# Patient Record
Sex: Male | Born: 1965 | Race: Black or African American | Hispanic: No | Marital: Married | Smoking: Never smoker
Health system: Southern US, Community
[De-identification: ages and names within clinical notes are randomized; demographics above are authoritative.]

## PROBLEM LIST (undated history)

## (undated) DIAGNOSIS — E7800 Pure hypercholesterolemia, unspecified: Secondary | ICD-10-CM

## (undated) DIAGNOSIS — I10 Essential (primary) hypertension: Secondary | ICD-10-CM

## (undated) DIAGNOSIS — E11319 Type 2 diabetes mellitus with unspecified diabetic retinopathy without macular edema: Secondary | ICD-10-CM

## (undated) DIAGNOSIS — E119 Type 2 diabetes mellitus without complications: Secondary | ICD-10-CM

## (undated) DIAGNOSIS — H3320 Serous retinal detachment, unspecified eye: Secondary | ICD-10-CM

## (undated) DIAGNOSIS — E109 Type 1 diabetes mellitus without complications: Secondary | ICD-10-CM

## (undated) DIAGNOSIS — I129 Hypertensive chronic kidney disease with stage 1 through stage 4 chronic kidney disease, or unspecified chronic kidney disease: Secondary | ICD-10-CM

## (undated) DIAGNOSIS — T678XXA Other effects of heat and light, initial encounter: Secondary | ICD-10-CM

## (undated) DIAGNOSIS — K3184 Gastroparesis: Secondary | ICD-10-CM

## (undated) HISTORY — DX: Type 2 diabetes mellitus with unspecified diabetic retinopathy without macular edema: E11.319

## (undated) HISTORY — PX: EYE SURGERY: SHX253

## (undated) HISTORY — DX: Serous retinal detachment, unspecified eye: H33.20

## (undated) HISTORY — PX: CATARACT EXTRACTION: SUR2

## (undated) HISTORY — PX: RETINAL DETACHMENT SURGERY: SHX105

---
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
## 2004-03-12 ENCOUNTER — Emergency Department (HOSPITAL_COMMUNITY): Admission: EM | Admit: 2004-03-12 | Discharge: 2004-03-12 | Payer: Self-pay | Admitting: Emergency Medicine

## 2004-07-14 ENCOUNTER — Ambulatory Visit: Payer: Self-pay | Admitting: *Deleted

## 2004-08-31 ENCOUNTER — Ambulatory Visit: Payer: Self-pay | Admitting: Family Medicine

## 2004-09-11 ENCOUNTER — Emergency Department (HOSPITAL_COMMUNITY): Admission: EM | Admit: 2004-09-11 | Discharge: 2004-09-11 | Payer: Self-pay | Admitting: Emergency Medicine

## 2004-09-19 ENCOUNTER — Emergency Department (HOSPITAL_COMMUNITY): Admission: EM | Admit: 2004-09-19 | Discharge: 2004-09-19 | Payer: Self-pay | Admitting: Emergency Medicine

## 2004-10-07 ENCOUNTER — Ambulatory Visit: Payer: Self-pay | Admitting: Family Medicine

## 2005-10-30 ENCOUNTER — Ambulatory Visit (HOSPITAL_COMMUNITY): Admission: RE | Admit: 2005-10-30 | Discharge: 2005-10-30 | Payer: Self-pay | Admitting: Internal Medicine

## 2006-03-19 ENCOUNTER — Emergency Department (HOSPITAL_COMMUNITY): Admission: EM | Admit: 2006-03-19 | Discharge: 2006-03-19 | Payer: Self-pay | Admitting: Emergency Medicine

## 2012-03-24 DIAGNOSIS — E113599 Type 2 diabetes mellitus with proliferative diabetic retinopathy without macular edema, unspecified eye: Secondary | ICD-10-CM | POA: Insufficient documentation

## 2012-05-02 DIAGNOSIS — E1142 Type 2 diabetes mellitus with diabetic polyneuropathy: Secondary | ICD-10-CM | POA: Insufficient documentation

## 2012-09-30 DIAGNOSIS — R809 Proteinuria, unspecified: Secondary | ICD-10-CM | POA: Insufficient documentation

## 2012-11-27 DIAGNOSIS — R609 Edema, unspecified: Secondary | ICD-10-CM | POA: Insufficient documentation

## 2014-12-17 ENCOUNTER — Emergency Department (HOSPITAL_COMMUNITY): Payer: Medicare Other

## 2014-12-17 ENCOUNTER — Emergency Department (HOSPITAL_COMMUNITY)
Admission: EM | Admit: 2014-12-17 | Discharge: 2014-12-17 | Disposition: A | Discharge status: Home / Self Care | Payer: Medicare Other | Attending: Emergency Medicine | Admitting: Emergency Medicine

## 2014-12-17 ENCOUNTER — Encounter (HOSPITAL_COMMUNITY): Payer: Self-pay | Admitting: Emergency Medicine

## 2014-12-17 DIAGNOSIS — R05 Cough: Secondary | ICD-10-CM | POA: Diagnosis present

## 2014-12-17 DIAGNOSIS — R059 Cough, unspecified: Secondary | ICD-10-CM

## 2014-12-17 DIAGNOSIS — J159 Unspecified bacterial pneumonia: Secondary | ICD-10-CM | POA: Diagnosis not present

## 2014-12-17 DIAGNOSIS — I10 Essential (primary) hypertension: Secondary | ICD-10-CM | POA: Diagnosis not present

## 2014-12-17 DIAGNOSIS — E119 Type 2 diabetes mellitus without complications: Secondary | ICD-10-CM | POA: Diagnosis not present

## 2014-12-17 DIAGNOSIS — J189 Pneumonia, unspecified organism: Secondary | ICD-10-CM

## 2014-12-17 HISTORY — DX: Essential (primary) hypertension: I10

## 2014-12-17 HISTORY — DX: Type 2 diabetes mellitus without complications: E11.9

## 2014-12-17 MED ORDER — HYDROCODONE-HOMATROPINE 5-1.5 MG/5ML PO SYRP: 5.0000 mL | ORAL_SOLUTION | Freq: Four times a day (QID) | ORAL | Status: DC | PRN

## 2014-12-17 MED ORDER — ACETAMINOPHEN 325 MG PO TABS
325.0000 mg | ORAL_TABLET | Freq: Once | ORAL | Status: AC
  Administered 2014-12-17: 325 mg via ORAL
  Filled 2014-12-17: qty 1

## 2014-12-17 MED ORDER — LEVOFLOXACIN 750 MG PO TABS: 750.0000 mg | ORAL_TABLET | Freq: Every day | ORAL | Status: DC

## 2014-12-17 NOTE — ED Notes (Signed)
Pt sts fever and cough x several days; pt sts seen x 2 at PCP; pt given zpack that he started yesterday; pt sts body aches and pain with cough

## 2014-12-17 NOTE — Discharge Instructions (Signed)
Pneumonia, Adult  Pneumonia is an infection of the lungs. It may be caused by a germ (virus or bacteria). Some types of pneumonia can spread easily from person to person. This can happen when you cough or sneeze.  HOME CARE   Only take medicine as told by your doctor.   Take your medicine (antibiotics) as told. Finish it even if you start to feel better.   Do not smoke.   You may use a vaporizer or humidifier in your room. This can help loosen thick spit (mucus).   Sleep so you are almost sitting up (semi-upright). This helps reduce coughing.   Rest.  A shot (vaccine) can help prevent pneumonia. Shots are often advised for:   People over 65 years old.   Patients on chemotherapy.   People with long-term (chronic) lung problems.   People with immune system problems.  GET HELP RIGHT AWAY IF:    You are getting worse.   You cannot control your cough, and you are losing sleep.   You cough up blood.   Your pain gets worse, even with medicine.   You have a fever.   Any of your problems are getting worse, not better.   You have shortness of breath or chest pain.  MAKE SURE YOU:    Understand these instructions.   Will watch your condition.   Will get help right away if you are not doing well or get worse.  Document Released: 03/06/2008 Document Revised: 12/11/2011 Document Reviewed: 12/09/2010  [employer] Patient Information 2015 [employer]. This information is not intended to replace advice given to you by your health care provider. Make sure you discuss any questions you have with your health care provider.

## 2014-12-17 NOTE — ED Notes (Signed)
Declined W/C at D/C and was escorted to lobby by RN. 

## 2014-12-17 NOTE — ED Provider Notes (Signed)
CSN: [id number]     Arrival date & time 12/17/14  1256 History  This chart was scribed for non-physician practitioner Glendell Docker, NP working with Charlesetta Shanks, MD, by Eustaquio Maize, ED Scribe. This patient was seen in room TR08C/TR08C and the patient's care was started at 1:54 PM.    Chief Complaint  Patient presents with  . Fever  . Cough   The history is provided by the patient. No language interpreter was used.     HPI Comments: Louis Peters is a 49 y.o. male who presents to the Emergency Department complaining of a cough that began 1.5 weeks ago. Pt also complains of a fever. Reports that he was seen at Urgent Care on Sunday, 3/13. Pt was tested for flu and prescribed Tamiflu. Pt states that he was not feeling any better so he went back to Urgent Care 1 day ago. He was prescribed a Z pack yesterday. He denies chills or any other symptoms. He also denies cigarette use.     Past Medical History  Diagnosis Date  . Hypertension   . Diabetes mellitus without complication    History reviewed. No pertinent past surgical history. History reviewed. No pertinent family history. History  Substance Use Topics  . Smoking status: Never Smoker   . Smokeless tobacco: Not on file  . Alcohol Use: Yes     Comment: occ    Review of Systems  Constitutional: Positive for fever. Negative for chills.  Respiratory: Positive for cough.   All other systems reviewed and are negative.     Allergies  Review of patient's allergies indicates no known allergies.  Home Medications   Prior to Admission medications   Medication Sig Start Date End Date Taking? Authorizing Provider  HYDROcodone-homatropine (HYDROMET) 5-1.5 MG/5ML syrup Take 5 mLs by mouth every 6 (six) hours as needed for cough. 12/17/14   Glendell Docker, NP  levofloxacin (LEVAQUIN) 750 MG tablet Take 1 tablet (750 mg total) by mouth daily. 12/17/14   Glendell Docker, NP   Triage Vitals: BP 133/63 mmHg  Pulse 99   Temp(Src) 100.6 F (38.1 C) (Oral)  Resp 18  SpO2 96%   Physical Exam  Constitutional: He is oriented to person, place, and time. He appears well-developed and well-nourished. No distress.  HENT:  Head: Normocephalic and atraumatic.  Nose: Rhinorrhea present.  Eyes: Conjunctivae and EOM are normal.  Neck: Neck supple. No tracheal deviation present.  Cardiovascular: Normal rate.   Pulmonary/Chest: Effort normal. No respiratory distress.  Musculoskeletal: Normal range of motion.  Neurological: He is alert and oriented to person, place, and time.  Skin: Skin is warm and dry.  Psychiatric: He has a normal mood and affect. His behavior is normal.  Nursing note and vitals reviewed.   ED Course  Procedures (including critical care time)  DIAGNOSTIC STUDIES: Oxygen Saturation is 96% on RA, normal by my interpretation.    COORDINATION OF CARE: 2:04 PM-Discussed treatment plan which includes CXR with pt at bedside and pt agreed to plan.   Labs Review Labs Reviewed - No data to display  Imaging Review Dg Chest 2 View  12/17/2014   CLINICAL DATA:  Croupy cough for 1.5 weeks, sore throat, history diabetes, hypertension  EXAM: CHEST  2 VIEW  COMPARISON:  None  FINDINGS: Upper normal heart size.  Normal mediastinal contours and pulmonary vascularity.  RIGHT upper lobe infiltrate near minor fissure consistent with pneumonia.  Central peribronchial thickening.  Remaining lungs clear.  No pleural effusion or  pneumothorax.  Bones unremarkable.  IMPRESSION: Bronchitic changes with RIGHT upper lobe pneumonia.   Electronically Signed   By: Lavonia Dana M.D.   On: 12/17/2014 13:58     EKG Interpretation None      MDM   Final diagnoses:  Cough  CAP (community acquired pneumonia)    Will switch to levofloxacin. Pt not hypoxic or tachycardic. Discussed return precautions with pt  I personally performed the services described in this documentation, which was scribed in my presence. The  recorded information has been reviewed and is accurate.      Glendell Docker, NP 12/17/14 1418  Charlesetta Shanks, MD 12/19/14 [phone]

## 2014-12-21 ENCOUNTER — Emergency Department (HOSPITAL_COMMUNITY): Payer: Medicare Other

## 2014-12-21 ENCOUNTER — Emergency Department (HOSPITAL_COMMUNITY)
Admission: EM | Admit: 2014-12-21 | Discharge: 2014-12-21 | Disposition: A | Discharge status: Home / Self Care | Payer: Medicare Other | Attending: Emergency Medicine | Admitting: Emergency Medicine

## 2014-12-21 ENCOUNTER — Encounter (HOSPITAL_COMMUNITY): Payer: Self-pay | Admitting: Family Medicine

## 2014-12-21 DIAGNOSIS — Z79899 Other long term (current) drug therapy: Secondary | ICD-10-CM | POA: Diagnosis not present

## 2014-12-21 DIAGNOSIS — Z792 Long term (current) use of antibiotics: Secondary | ICD-10-CM | POA: Diagnosis not present

## 2014-12-21 DIAGNOSIS — N289 Disorder of kidney and ureter, unspecified: Secondary | ICD-10-CM | POA: Diagnosis not present

## 2014-12-21 DIAGNOSIS — Z91040 Latex allergy status: Secondary | ICD-10-CM | POA: Diagnosis not present

## 2014-12-21 DIAGNOSIS — E108 Type 1 diabetes mellitus with unspecified complications: Secondary | ICD-10-CM

## 2014-12-21 DIAGNOSIS — Z794 Long term (current) use of insulin: Secondary | ICD-10-CM | POA: Insufficient documentation

## 2014-12-21 DIAGNOSIS — I10 Essential (primary) hypertension: Secondary | ICD-10-CM | POA: Diagnosis not present

## 2014-12-21 DIAGNOSIS — E109 Type 1 diabetes mellitus without complications: Secondary | ICD-10-CM | POA: Insufficient documentation

## 2014-12-21 DIAGNOSIS — R059 Cough, unspecified: Secondary | ICD-10-CM

## 2014-12-21 DIAGNOSIS — R079 Chest pain, unspecified: Secondary | ICD-10-CM | POA: Diagnosis present

## 2014-12-21 DIAGNOSIS — R05 Cough: Secondary | ICD-10-CM

## 2014-12-21 LAB — CBG MONITORING, ED
Glucose-Capillary: 422 mg/dL — ABNORMAL HIGH (ref 70–99)
Glucose-Capillary: 349 mg/dL — ABNORMAL HIGH (ref 70–99)

## 2014-12-21 LAB — CBC
HCT: 35.6 % — ABNORMAL LOW (ref 39.0–52.0)
HEMOGLOBIN: 11.5 g/dL — AB (ref 13.0–17.0)
MCH: 27.6 pg (ref 26.0–34.0)
MCHC: 32.3 g/dL (ref 30.0–36.0)
MCV: 85.6 fL (ref 78.0–100.0)
Platelets: 387 10*3/uL (ref 150–400)
RBC: 4.16 MIL/uL — AB (ref 4.22–5.81)
RDW: 12.5 % (ref 11.5–15.5)
WBC: 9.7 10*3/uL (ref 4.0–10.5)

## 2014-12-21 LAB — I-STAT CHEM 8, ED
BUN: 23 mg/dL (ref 6–23)
CALCIUM ION: 1.23 mmol/L (ref 1.12–1.23)
CHLORIDE: 101 mmol/L (ref 96–112)
CREATININE: 1.6 mg/dL — AB (ref 0.50–1.35)
Glucose, Bld: 377 mg/dL — ABNORMAL HIGH (ref 70–99)
HCT: 37 % — ABNORMAL LOW (ref 39.0–52.0)
Hemoglobin: 12.6 g/dL — ABNORMAL LOW (ref 13.0–17.0)
POTASSIUM: 4.5 mmol/L (ref 3.5–5.1)
SODIUM: 138 mmol/L (ref 135–145)
TCO2: 22 mmol/L (ref 0–100)

## 2014-12-21 LAB — I-STAT TROPONIN, ED: TROPONIN I, POC: 0 ng/mL (ref 0.00–0.08)

## 2014-12-21 LAB — BRAIN NATRIURETIC PEPTIDE: B NATRIURETIC PEPTIDE 5: 42.8 pg/mL (ref 0.0–100.0)

## 2014-12-21 MED ORDER — INSULIN ASPART 100 UNIT/ML ~~LOC~~ SOLN
5.0000 [IU] | Freq: Once | SUBCUTANEOUS | Status: AC
  Administered 2014-12-21: 5 [IU] via INTRAVENOUS
  Filled 2014-12-21: qty 1

## 2014-12-21 MED ORDER — SODIUM CHLORIDE 0.9 % IV BOLUS (SEPSIS)
2000.0000 mL | Freq: Once | INTRAVENOUS | Status: AC
  Administered 2014-12-21: 2000 mL via INTRAVENOUS

## 2014-12-21 NOTE — ED Notes (Signed)
Pt recently dx with PNA and the flu. sts just finished abx. sts started vomiting on Saturday. Unable to eat. sts chest pain, SOB.

## 2014-12-21 NOTE — ED Notes (Signed)
Dr Beaton at bedside 

## 2014-12-21 NOTE — ED Notes (Signed)
Pt remains monitored by blood pressure, pulse ox, and 5 lead. pts family at bedside.

## 2014-12-21 NOTE — Discharge Instructions (Signed)
How to Avoid Diabetes Problems You can do a lot to prevent or slow down diabetes problems. Following your diabetes plan and taking care of yourself can reduce your risk of serious or life-threatening complications. Below, you will find certain things you can do to prevent diabetes problems. MANAGE YOUR DIABETES Follow your health care provider's, nurse educator's, and dietitian's instructions for managing your diabetes. They will teach you the basics of diabetes care. They can help answer questions you may have. Learn about diabetes and make healthy choices regarding eating and physical activity. Monitor your blood glucose level regularly. Your health care provider will help you decide how often to check your blood glucose level depending on your treatment goals and how well you are meeting them.  DO NOT USE NICOTINE Nicotine and diabetes are a dangerous combination. Nicotine raises your risk for diabetes problems. If you quit using nicotine, you will lower your risk for heart attack, stroke, nerve disease, and kidney disease. Your cholesterol and your blood pressure levels may improve. Your blood circulation will also improve. Do not use any tobacco products, including cigarettes, chewing tobacco, or electronic cigarettes. If you need help quitting, ask your health care provider. KEEP YOUR BLOOD PRESSURE UNDER CONTROL Keeping your blood pressure under control will help prevent damage to your eyes, kidneys, heart, and blood vessels. Blood pressure consists of two numbers. The top number should be below 120, and the bottom number should be below 80 (120/80). Keep your blood pressure as close to these numbers as you can. If you already have kidney disease, you may want even lower blood pressure to protect your kidneys. Talk to your health care provider to make sure that your blood pressure goal is right for your needs. Meal planning, medicines, and exercise can help you reach your blood pressure target. Have  your blood pressure checked at every visit with your health care provider. KEEP YOUR CHOLESTEROL UNDER CONTROL Normal cholesterol levels will help prevent heart disease and stroke. These are the biggest health problems for people with diabetes. Keeping cholesterol levels under control can also help with blood flow. Have your cholesterol level checked at least once a year. Your health care provider may prescribe a medicine known as a statin. Statins lower your cholesterol. If you are not taking a statin, ask your health care provider if you should be. Meal planning, exercise, and medicines can help you reach your cholesterol targets.  SCHEDULE AND KEEP YOUR ANNUAL PHYSICAL EXAMS AND EYE EXAMS Your health care provider will tell you how often he or she wants to see you depending on your plan of treatment. It is important that you keep these appointments so that possible problems can be identified early and complications can be avoided or treated.  Every visit with your health care provider should include your weight, blood pressure, and an evaluation of your blood glucose control.  Your hemoglobin A1c should be checked:  At least twice a year if you are at your goal.  Every 3 months if there are changes in treatment.  If you are not meeting your goals.  Your blood lipids should be checked yearly. You should also be checked yearly to see if you have protein in your urine (microalbumin).  Schedule a dilated eye exam within 5 years of your diagnosis if you have type 1 diabetes, and then yearly. Schedule a dilated eye exam at diagnosis if you have type 2 diabetes, and then yearly. All exams thereafter can be extended to every 2  to 3 years if one or more exams have been normal. KEEP YOUR VACCINES CURRENT The flu vaccine is recommended yearly. The formula for the vaccine changes every year and needs to be updated for the best protection against current viruses. It is recommended that people with diabetes  who are over 70 years old get the pneumonia vaccine. In some cases, two separate shots may be given. Ask your health care provider if your pneumonia vaccination is up-to-date. However, there are some instances where another vaccine is recommended. Check with your health care provider. TAKE CARE OF YOUR FEET  Diabetes may cause you to have a poor blood supply (circulation) to your legs and feet. Because of this, the skin may be thinner, break easier, and heal more slowly. You also may have nerve damage in your legs and feet, causing decreased feeling. You may not notice minor injuries to your feet that could lead to serious problems or infections. Taking care of your feet is very important. Visual foot exams are performed at every routine medical visit. The exams check for cuts, injuries, or other problems with the feet. A comprehensive foot exam should be done yearly. This includes visual inspection as well as assessing foot pulses and testing for loss of sensation. You should also do the following:  Inspect your feet daily for cuts, calluses, blisters, ingrown toenails, and signs of infection, such as redness, swelling, or pus.  Wash and dry your feet thoroughly, especially between the toes.  Avoid soaking your feet regularly in hot water baths.  Moisturize dry skin with lotion, avoiding areas between your toes.  Cut toenails straight across and file the edges.  Avoid shoes that do not fit well or have areas that irritate your skin.  Avoid going barefooted or wearing only socks. Your feet need protection. TAKE CARE OF YOUR TEETH People with poorly controlled diabetes are more likely to have gum (periodontal) disease. These infections make diabetes harder to control. Periodontal diseases, if left untreated, can lead to tooth loss. Brush your teeth twice a day, floss, and see your dentist for checkups and cleaning every 6 months, or 2 times a year. ASK YOUR HEALTH CARE PROVIDER ABOUT TAKING  ASPIRIN Taking aspirin daily is recommended to help prevent cardiovascular disease in people with and without diabetes. Ask your health care provider if this would benefit you and what dose he or she would recommend. DRINK RESPONSIBLY Moderate amounts of alcohol (less than 1 drink per day for adult women and less than 2 drinks per day for adult men) have a minimal effect on blood glucose if ingested with food. It is important to eat food with alcohol to avoid hypoglycemia. People should avoid alcohol if they have a history of alcohol abuse or dependence, if they are pregnant, and if they have liver disease, pancreatitis, advanced neuropathy, or severe hypertriglyceridemia. LESSEN STRESS Living with diabetes can be stressful. When you are under stress, your blood glucose may be affected in two ways:  Stress hormones may cause your blood glucose to rise.  You may be distracted from taking good care of yourself. It is a good idea to be aware of your stress level and make changes that are necessary to help you better manage challenging situations. Support groups, planned relaxation, a hobby you enjoy, meditation, healthy relationships, and exercise all work to lower your stress level. If your efforts do not seem to be helping, get help from your health care provider or a trained mental health professional. Document  Released: 06/06/2011 Document Revised: 02/02/2014 Document Reviewed: 11/12/2013 [facility] Patient Information 2015 [place], [place]. This information is not intended to replace advice given to you by your health care provider. Make sure you discuss any questions you have with your health care provider.

## 2014-12-21 NOTE — ED Provider Notes (Signed)
CSN: [id number]     Arrival date & time 12/21/14  1713 History   First MD Initiated Contact with Patient 12/21/14 1856     Chief Complaint  Patient presents with  . Chest Pain  . Cough  . Shortness of Breath      HPI Patient was recently diagnosed with pneumonia.  Started on antibiotics.  Has not been taking his insulin medication.  Patient last vomited on Saturday.  Has been thirsty. Past Medical History  Diagnosis Date  . Hypertension   . Diabetes mellitus without complication    History reviewed. No pertinent past surgical history. History reviewed. No pertinent family history. History  Substance Use Topics  . Smoking status: Never Smoker   . Smokeless tobacco: Not on file  . Alcohol Use: Yes     Comment: occ    Review of Systems  All other systems reviewed and are negative  Allergies  Latex  Home Medications   Prior to Admission medications   Medication Sig Start Date End Date Taking? Authorizing Provider  acetaminophen (TYLENOL) 500 MG tablet Take 1,000 mg by mouth every 6 (six) hours as needed for mild pain.   Yes Historical Provider, MD  atorvastatin (LIPITOR) 20 MG tablet Take 20 mg by mouth daily. 10/13/14  Yes Historical Provider, MD  AZOR 10-40 MG per tablet Take 1 tablet by mouth daily. 12/16/14  Yes Historical Provider, MD  HUMALOG KWIKPEN 100 UNIT/ML KiwkPen Inject 5-7 Units into the skin 4 (four) times daily.  12/14/14  Yes Historical Provider, MD  HYDROcodone-homatropine (HYDROMET) 5-1.5 MG/5ML syrup Take 5 mLs by mouth every 6 (six) hours as needed for cough. 12/17/14  Yes Glendell Docker, NP  ibuprofen (ADVIL,MOTRIN) 200 MG tablet Take 400 mg by mouth every 6 (six) hours as needed for mild pain.   Yes Historical Provider, MD  insulin glargine (LANTUS) 100 UNIT/ML injection Inject 45 Units into the skin at bedtime.    Yes Historical Provider, MD  levofloxacin (LEVAQUIN) 750 MG tablet Take 1 tablet (750 mg total) by mouth daily. 12/17/14  Yes Glendell Docker, NP   BP 144/77 mmHg  Pulse 93  Temp(Src) 98.1 F (36.7 C)  Resp 16  Wt 225 lb (102.059 kg)  SpO2 100% Physical Exam Physical Exam  Nursing note and vitals reviewed. Constitutional: He is oriented to person, place, and time. He appears well-developed and well-nourished. No distress.  HENT:  Head: Normocephalic and atraumatic.  Eyes: Pupils are equal, round, and reactive to light.  Neck: Normal range of motion.  Cardiovascular: Normal rate and intact distal pulses.   Pulmonary/Chest: No respiratory distress.  Abdominal: Normal appearance. He exhibits no distension.  Musculoskeletal: Normal range of motion.  Neurological: He is alert and oriented to person, place, and time. No cranial nerve deficit.  Skin: Skin is warm and dry. No rash noted.  Psychiatric: He has a normal mood and affect. His behavior is normal.   ED Course  Procedures (including critical care time) Medications  sodium chloride 0.9 % bolus 2,000 mL (0 mLs Intravenous Stopped 12/21/14 2059)  insulin aspart (novoLOG) injection 5 Units (5 Units Intravenous Given 12/21/14 2156)    Labs Review Labs Reviewed  CBC - Abnormal; Notable for the following:    RBC 4.16 (*)    Hemoglobin 11.5 (*)    HCT 35.6 (*)    All other components within normal limits  BASIC METABOLIC PANEL - Abnormal; Notable for the following:    Sodium 129 (*)  Potassium 5.8 (*)    Chloride 94 (*)    Glucose, Bld 745 (*)    Creatinine, Ser 2.30 (*)    GFR calc non Af Amer 31 (*)    GFR calc Af Amer 36 (*)    All other components within normal limits  CBG MONITORING, ED - Abnormal; Notable for the following:    Glucose-Capillary 422 (*)    All other components within normal limits  I-STAT CHEM 8, ED - Abnormal; Notable for the following:    Creatinine, Ser 1.60 (*)    Glucose, Bld 377 (*)    Hemoglobin 12.6 (*)    HCT 37.0 (*)    All other components within normal limits  CBG MONITORING, ED - Abnormal; Notable for the  following:    Glucose-Capillary 349 (*)    All other components within normal limits  BRAIN NATRIURETIC PEPTIDE  I-STAT TROPOININ, ED    Imaging Review No results found.   EKG Interpretation   Date/Time:  Monday December 21 2014 17:18:40 EDT Ventricular Rate:  96 PR Interval:  194 QRS Duration: 72 QT Interval:  336 QTC Calculation: 424 R Axis:   52 Text Interpretation:  Normal sinus rhythm Nonspecific T wave abnormality  Abnormal ECG Confirmed by Quame Spratlin  MD, Emaree Chiu ([id number]) on 12/21/2014 7:07:05  PM     After treatment in the ED the patient feels back to baseline and wants to go home.  Instructed for him to start back on his insulin when he gets home MDM   Final diagnoses:  Renal insufficiency  Type 1 diabetes mellitus with complications        Leonard Schwartz, MD 12/26/14 [phone]

## 2014-12-21 NOTE — ED Notes (Signed)
Pt remains monitored by blood pressure, pulse ox, and 5 lead. pts family remains at bedside.  

## 2014-12-21 NOTE — ED Notes (Signed)
Pts CBG 349

## 2014-12-22 LAB — BASIC METABOLIC PANEL
Anion gap: 13 (ref 5–15)
BUN: 22 mg/dL (ref 6–23)
CHLORIDE: 94 mmol/L — AB (ref 96–112)
CO2: 22 mmol/L (ref 19–32)
Calcium: 8.7 mg/dL (ref 8.4–10.5)
Creatinine, Ser: 2.3 mg/dL — ABNORMAL HIGH (ref 0.50–1.35)
GFR calc non Af Amer: 31 mL/min — ABNORMAL LOW (ref >90)
GFR, EST AFRICAN AMERICAN: 36 mL/min — AB (ref >90)
Glucose, Bld: 745 mg/dL (ref 70–99)
POTASSIUM: 5.8 mmol/L — AB (ref 3.5–5.1)
Sodium: 129 mmol/L — ABNORMAL LOW (ref 135–145)

## 2015-01-20 ENCOUNTER — Encounter: Payer: Medicare Other | Attending: Family Medicine | Admitting: *Deleted

## 2015-01-20 VITALS — Ht 70.0 in | Wt 219.1 lb

## 2015-01-20 DIAGNOSIS — E1065 Type 1 diabetes mellitus with hyperglycemia: Secondary | ICD-10-CM

## 2015-01-20 DIAGNOSIS — Z713 Dietary counseling and surveillance: Secondary | ICD-10-CM | POA: Diagnosis not present

## 2015-01-20 DIAGNOSIS — Z794 Long term (current) use of insulin: Secondary | ICD-10-CM | POA: Insufficient documentation

## 2015-01-20 DIAGNOSIS — E109 Type 1 diabetes mellitus without complications: Secondary | ICD-10-CM | POA: Insufficient documentation

## 2015-01-20 NOTE — Patient Instructions (Addendum)
Plan:  Aim for 4 Carb Choices per meal (60 grams) +/- 1 either way Practice looking at your foods by food group and then convert to Carb Choices from there. Aim for 0-2 Carbs per snack if hungry  Include protein in moderation with your meals and snacks Consider reading food labels for Total Carbohydrate of foods Continue with your activity level daily as tolerated Continue checking BG daily   Consider taking Lantus insulin at 9 PM more consistently and Humalog at meals as directed Consider attending the Type 1 Diabetes Support Group next month

## 2015-01-20 NOTE — Progress Notes (Signed)
Diabetes Self-Management Education  Visit Type:  Initial  Appt. Start Time: 1030 Appt. End Time: 1200  01/20/2015  [name], identified by name and date of birth, is a 49 y.o. male with a diagnosis of Diabetes: Type 1 (Diagnosed in 1982).  Other people present during visit:  Patient   ASSESSMENT  Height 5\' 10"  (1.778 m), weight 219 lb 1.6 oz (99.383 kg). Body mass index is 31.44 kg/(m^2).  Initial Visit Information:  Are you currently following a meal plan?: No   Are you taking your medications as prescribed?: Yes Are you checking your feet?: Yes How many days per week are you checking your feet?: 7 How often do you need to have someone help you when you read instructions, pamphlets, or other written materials from your doctor or pharmacy?: 1 - Never What is the last grade level you completed in school?: 12  Psychosocial:     Patient Belief/Attitude about Diabetes: Motivated to manage diabetes Self-care barriers: None Self-management support: Family Other persons present: Patient Patient Concerns: Problem Solving (States low BG every AM around 11) Special Needs: None Preferred Learning Style: Auditory Learning Readiness: Ready  Complications:   Last HgB A1C per patient/outside source: 11.6 % How often do you check your blood sugar?: 3-4 times/day Fasting Blood glucose range (mg/dL): <70, >200 Postprandial Blood glucose range (mg/dL): <70, >200 Number of hypoglycemic episodes per month: 30 Have you had a dilated eye exam in the past 12 months?: Yes (retina detachment surgery 3 weeks ago) Have you had a dental exam in the past 12 months?: No (dentures)  Diet Intake:  Breakfast: eggs, grits, sausage or bacon, toast,  Snack (morning): if low BG, drinks milk for low BG treatment Lunch: sandwich, fresh fruit Snack (afternoon): same as AM Dinner: meat, starch, vegetables, occasionally bread,  Snack (evening): left overs or sandwich to prevent low BG during the  night Beverage(s): water  Exercise:  Exercise: Moderate (swimming / aerobic walking) (runs, walks, treadmill, lifts weights, for 90 minutes every day) Moderate Exercise amount of time (min / week):  (goes every AM)  Individualized Plan for Diabetes Self-Management Training:   Learning Objective:  Patient will have a greater understanding of diabetes self-management.  Patient education plan per assessed needs and concerns is to attend individual sessions for     Education Topics Reviewed with Patient Today:    Role of diet in the treatment of diabetes and the relationship between the three main macronutrients and blood glucose level, Food label reading, portion sizes and measuring food., Carbohydrate counting, Meal timing in regards to the patients' current diabetes medication., Other (comment) (Discussed jpotential of moving towards a Carb Ratio so he could adjust meal time insulin to actual food intake) Role of exercise on diabetes management, blood pressure control and cardiac health. Reviewed patients medication for diabetes, action, purpose, timing of dose and side effects.   Taught treatment of hypoglycemia - the 15 rule. Assessed and discussed foot care and prevention of foot problems Other (comment) (Provided flyer for Type 1 Support Group meetings)      PATIENTS GOALS/Plan (Developed by the patient):  Nutrition: Other (comment) (MOre consistent carb intake throughout the day) Physical Activity: Exercise 5-7 days per week Medications: take my medication as prescribed Monitoring : test blood glucose pre and post meals as discussed Health Coping: Not Applicable  Plan:   Patient Instructions  Plan:  Aim for 4 Carb Choices per meal (60 grams) +/- 1 either way Practice looking at your  foods by food group and then convert to Carb Choices from there. Aim for 0-2 Carbs per snack if hungry  Include protein in moderation with your meals and snacks Consider reading food labels  for Total Carbohydrate of foods Continue with your activity level daily as tolerated Continue checking BG daily   Consider taking Lantus insulin at 9 PM more consistently and Humalog at meals as directed Consider attending the Type 1 Diabetes Support Group next month       Expected Outcomes:  Demonstrated interest in learning. Expect positive outcomes  Education material provided: Meal plan card and Carbohydrate counting sheet, Intro to Pumping handout, Label Reading, Insulin Action handout  If problems or questions, patient to contact team via:  Phone and Email  Future DSME appointment: PRN (Will determine follow up based on future appointments with MD)

## 2015-01-22 ENCOUNTER — Encounter: Payer: Self-pay | Admitting: *Deleted

## 2015-01-26 ENCOUNTER — Ambulatory Visit (INDEPENDENT_AMBULATORY_CARE_PROVIDER_SITE_OTHER): Payer: Medicare Other | Admitting: Endocrinology

## 2015-01-26 ENCOUNTER — Encounter: Payer: Self-pay | Admitting: Endocrinology

## 2015-01-26 VITALS — BP 136/80 | HR 85 | Temp 98.2°F | Ht 70.0 in | Wt 227.0 lb

## 2015-01-26 DIAGNOSIS — E1022 Type 1 diabetes mellitus with diabetic chronic kidney disease: Secondary | ICD-10-CM

## 2015-01-26 DIAGNOSIS — N189 Chronic kidney disease, unspecified: Secondary | ICD-10-CM

## 2015-01-26 NOTE — Patient Instructions (Addendum)
good diet and exercise significantly improve the control of your diabetes.  please let me know if you wish to be referred to a dietician.  high blood sugar is very risky to your health.  you should see an eye doctor and dentist every year.  It is very important to get all recommended vaccinations.  controlling your blood pressure and cholesterol drastically reduces the damage diabetes does to your body.  Those who smoke should quit.  please discuss these with your doctor.  At our office, we are fortunate to have two specialists who are happy to help you:   Leonia Reader, RN, CDE, is a diabetes educator and pump trainer.  She is here on Monday mornings, and all day Tuesday and Wednesday.  She is can help you with low blood sugar avoidance and treatment, injecting insulin, sick day management, and others.   Antonieta Iba, RD is our dietician.  She is here all day Thursday and Friday.  She can advise you about a healthy diet.  She can also help you about a variety of special diabetes situations, such as shift work, Actor, gluten-free, diet for kidney patients, traveling with diabetes, and help for those who need to gain weight.   check your blood sugar 5 times a day: before the 3 meals, as needed for symptoms, and at bedtime.  also check if you have symptoms of your blood sugar being too high or too low.  please keep a record of the readings and bring it to your next appointment here.  You can write it on any piece of paper.  please call us sooner if your blood sugar goes below 70, or if you have a lot of readings over 200. blood tests are requested for you today.  We'll let you know about the results. For now: Please reduce the lantus to 35 units at bedtime, and: Increase the humalog to 10 units 3 times a day (just before each meal), no matter what your blood sugar is. Please come back for a follow-up appointment in 2 weeks

## 2015-01-26 NOTE — Progress Notes (Signed)
Subjective:    Patient ID: Louis Peters, male    DOB: Dec 08, 1965, 49 y.o.   MRN: [id number]  HPI Hx is from pt and wife.  pt states DM was dx'ed in 1982; he has mild if any neuropathy of the lower extremities, but he has associated retinopathy and renal insufficiency; he has been on insulin since soon after dx; pt says his diet and exercise are "pretty good;' he has never had pancreatitis or DKA.  Last episode of severe hypoglycemia was last week.  This happens with exercise, at any time of day. Past Medical History  Diagnosis Date  . Hypertension   . Diabetes mellitus without complication     No past surgical history on file.  History   Social History  . Marital Status: Married    Spouse Name: N/A  . Number of Children: N/A  . Years of Education: N/A   Occupational History  . Not on file.   Social History Main Topics  . Smoking status: Never Smoker   . Smokeless tobacco: Not on file  . Alcohol Use: Yes     Comment: occ  . Drug Use: No  . Sexual Activity: Not on file   Other Topics Concern  . Not on file   Social History Narrative    Current Outpatient Prescriptions on File Prior to Visit  Medication Sig Dispense Refill  . AZOR 10-40 MG per tablet Take 1 tablet by mouth daily.  0  . HUMALOG KWIKPEN 100 UNIT/ML KiwkPen Inject 10 Units into the skin 3 (three) times daily with meals.   0  . ibuprofen (ADVIL,MOTRIN) 200 MG tablet Take 400 mg by mouth every 6 (six) hours as needed for mild pain.    Marland Kitchen insulin glargine (LANTUS) 100 UNIT/ML injection Inject 35 Units into the skin at bedtime.      No current facility-administered medications on file prior to visit.    Allergies  Allergen Reactions  . Latex Rash    Family History  Problem Relation Age of Onset  . Diabetes Maternal Grandmother   . Diabetes Sister     BP 136/80 mmHg  Pulse 85  Temp(Src) 98.2 F (36.8 C) (Oral)  Ht 5\' 10"  (1.778 m)  Wt 227 lb (102.967 kg)  BMI 32.57 kg/m2  SpO2  98%  Review of Systems denies headache, chest pain, sob, n/v, urinary frequency, memory loss, cold intolerance, and rhinorrhea.  He has lost weight recently.  He has chronic visual loss, excessive diaphoresis, easy bruising, and muscle cramps.    Objective:   Physical Exam  VS: see vs page GEN: no distress HEAD: head: no deformity eyes: no periorbital swelling, no proptosis external nose and ears are normal mouth: no lesion seen NECK: supple, thyroid is not enlarged CHEST WALL: no deformity LUNGS: clear to auscultation BREASTS:  No gynecomastia CV: reg rate and rhythm, no murmur ABD: abdomen is soft, nontender.  no hepatosplenomegaly.  not distended.  no hernia MUSCULOSKELETAL: muscle bulk and strength are grossly normal.  no obvious joint swelling.  gait is normal and steady EXTEMITIES: no deformity.  no ulcer on the feet.  feet are of normal color and temp.  1+ bilat leg edema PULSES: dorsalis pedis intact bilat.  no carotid bruit NEURO:  cn 2-12 grossly intact.   readily moves all 4's.  sensation is intact to touch on the feet SKIN:  Normal texture and temperature.  No rash or suspicious lesion is visible.   NODES:  None palpable  at the neck PSYCH: alert, well-oriented.  Does not appear anxious nor depressed.  Lab Results  Component Value Date   CREATININE 1.60* 12/21/2014   BUN 23 12/21/2014   NA 138 12/21/2014   K 4.5 12/21/2014   CL 101 12/21/2014   CO2 22 12/21/2014       Assessment & Plan:  DM: new to me: The pattern of his cbg's indicates he needs some adjustment in his therapy.   Patient is advised the following: Patient Instructions  good diet and exercise significantly improve the control of your diabetes.  please let me know if you wish to be referred to a dietician.  high blood sugar is very risky to your health.  you should see an eye doctor and dentist every year.  It is very important to get all recommended vaccinations.  controlling your blood pressure  and cholesterol drastically reduces the damage diabetes does to your body.  Those who smoke should quit.  please discuss these with your doctor.  At our office, we are fortunate to have two specialists who are happy to help you:   Leonia Reader, RN, CDE, is a diabetes educator and pump trainer.  She is here on Monday mornings, and all day Tuesday and Wednesday.  She is can help you with low blood sugar avoidance and treatment, injecting insulin, sick day management, and others.   Antonieta Iba, RD is our dietician.  She is here all day Thursday and Friday.  She can advise you about a healthy diet.  She can also help you about a variety of special diabetes situations, such as shift work, Actor, gluten-free, diet for kidney patients, traveling with diabetes, and help for those who need to gain weight.   check your blood sugar 5 times a day: before the 3 meals, as needed for symptoms, and at bedtime.  also check if you have symptoms of your blood sugar being too high or too low.  please keep a record of the readings and bring it to your next appointment here.  You can write it on any piece of paper.  please call us sooner if your blood sugar goes below 70, or if you have a lot of readings over 200. blood tests are requested for you today.  We'll let you know about the results. For now: Please reduce the lantus to 35 units at bedtime, and: Increase the humalog to 10 units 3 times a day (just before each meal), no matter what your blood sugar is. Please come back for a follow-up appointment in 2 weeks

## 2015-02-15 ENCOUNTER — Ambulatory Visit: Payer: Medicare Other | Admitting: Endocrinology

## 2015-03-10 ENCOUNTER — Ambulatory Visit: Payer: Medicare Other | Admitting: Endocrinology

## 2015-03-10 DIAGNOSIS — Z0289 Encounter for other administrative examinations: Secondary | ICD-10-CM

## 2015-12-08 ENCOUNTER — Encounter: Payer: Self-pay | Admitting: Endocrinology

## 2015-12-08 ENCOUNTER — Ambulatory Visit (INDEPENDENT_AMBULATORY_CARE_PROVIDER_SITE_OTHER): Payer: Medicare Other | Admitting: Endocrinology

## 2015-12-08 VITALS — BP 144/86 | HR 80 | Temp 98.4°F | Ht 70.0 in | Wt 232.0 lb

## 2015-12-08 DIAGNOSIS — N183 Chronic kidney disease, stage 3 unspecified: Secondary | ICD-10-CM

## 2015-12-08 DIAGNOSIS — N189 Chronic kidney disease, unspecified: Secondary | ICD-10-CM

## 2015-12-08 DIAGNOSIS — E1122 Type 2 diabetes mellitus with diabetic chronic kidney disease: Secondary | ICD-10-CM

## 2015-12-08 DIAGNOSIS — E1022 Type 1 diabetes mellitus with diabetic chronic kidney disease: Secondary | ICD-10-CM

## 2015-12-08 DIAGNOSIS — Z794 Long term (current) use of insulin: Secondary | ICD-10-CM

## 2015-12-08 LAB — POCT GLYCOSYLATED HEMOGLOBIN (HGB A1C): Hemoglobin A1C: 10

## 2015-12-08 MED ORDER — BASAGLAR KWIKPEN 100 UNIT/ML ~~LOC~~ SOPN: 55.0000 [IU] | PEN_INJECTOR | SUBCUTANEOUS | Status: DC

## 2015-12-08 NOTE — Patient Instructions (Addendum)
check your blood sugar 5 times a day: before the 3 meals, as needed for symptoms, and at bedtime.  also check if you have symptoms of your blood sugar being too high or too low.  please keep a record of the readings and bring it to your next appointment here.  You can write it on any piece of paper.  please call us sooner if your blood sugar goes below 70, or if you have a lot of readings over 200. For now: Please change the lantus to "basaglar," 55 units at each morning, and: Stop taking the humalog. Please come back for a follow-up appointment in 2 weeks.

## 2015-12-08 NOTE — Progress Notes (Signed)
Subjective:    Patient ID: Louis Peters, male    DOB: 1965/11/21, 50 y.o.   MRN: [id number]  HPI  Pt returns for f/u of diabetes mellitus: DM type: Insulin-requiring type 2 Dx'ed: [id number] Complications: retinopathy and renal insufficiency Therapy: insulin since soon after dx DKA: never Severe hypoglycemia: never Pancreatitis: never Other: he takes multiple daily injections Interval history: He is overdue and late for appt today.  pt states he feels well in general.  no cbg record, but states cbg's vary widely.  Pt says he sometimes misses the insulin. He says he has hypoglycemia approx twice per month.   Past Medical History  Diagnosis Date  . Hypertension   . Diabetes mellitus without complication ([place])     No past surgical history on file.  Social History   Social History  . Marital Status: Married    Spouse Name: N/A  . Number of Children: N/A  . Years of Education: N/A   Occupational History  . Not on file.   Social History Main Topics  . Smoking status: Never Smoker   . Smokeless tobacco: Not on file  . Alcohol Use: Yes     Comment: occ  . Drug Use: No  . Sexual Activity: Not on file   Other Topics Concern  . Not on file   Social History Narrative    Current Outpatient Prescriptions on File Prior to Visit  Medication Sig Dispense Refill  . AZOR 10-40 MG per tablet Take 1 tablet by mouth daily.  0  . ibuprofen (ADVIL,MOTRIN) 200 MG tablet Take 400 mg by mouth every 6 (six) hours as needed for mild pain.     No current facility-administered medications on file prior to visit.    Allergies  Allergen Reactions  . Fexofenadine Rash    unknown unknown unknown  . Latex Rash    Family History  Problem Relation Age of Onset  . Diabetes Maternal Grandmother   . Diabetes Sister     BP 144/86 mmHg  Pulse 80  Temp(Src) 98.4 F (36.9 C) (Oral)  Ht 5\' 10"  (1.778 m)  Wt 232 lb (105.235 kg)  BMI 33.29 kg/m2  SpO2 97%    Review of  Systems Denies LOC    Objective:   Physical Exam VITAL SIGNS:  See vs page GENERAL: no distress Pulses: dorsalis pedis intact bilat.   MSK: no deformity of the feet CV: no leg edema Skin:  no ulcer on the feet.  normal color and temp on the feet. Neuro: sensation is intact to touch on the feet. Ext: There is bilateral onychomycosis of the toenails   A1c=10%    Assessment & Plan:  DM: severe exacerbation Noncompliance with cbg recording and insulin, worse.  He needs a simpler regimen. Hypoglycemia: this limits the aggressiveness of his insulin rx.  We'll follow for now.   Patient is advised the following: Patient Instructions  check your blood sugar 5 times a day: before the 3 meals, as needed for symptoms, and at bedtime.  also check if you have symptoms of your blood sugar being too high or too low.  please keep a record of the readings and bring it to your next appointment here.  You can write it on any piece of paper.  please call us sooner if your blood sugar goes below 70, or if you have a lot of readings over 200. For now: Please change the lantus to "basaglar," 55 units at each morning, and: Stop  taking the humalog. Please come back for a follow-up appointment in 2 weeks.

## 2015-12-09 DIAGNOSIS — E119 Type 2 diabetes mellitus without complications: Secondary | ICD-10-CM

## 2015-12-09 DIAGNOSIS — E109 Type 1 diabetes mellitus without complications: Secondary | ICD-10-CM

## 2015-12-09 DIAGNOSIS — E1022 Type 1 diabetes mellitus with diabetic chronic kidney disease: Secondary | ICD-10-CM | POA: Insufficient documentation

## 2015-12-09 HISTORY — DX: Type 1 diabetes mellitus without complications: E10.9

## 2015-12-22 ENCOUNTER — Ambulatory Visit: Payer: Medicare Other | Admitting: Endocrinology

## 2015-12-23 ENCOUNTER — Telehealth: Payer: Self-pay | Admitting: Endocrinology

## 2015-12-23 NOTE — Telephone Encounter (Signed)
Patient no showed today's appt. Please advise on how to follow up. °A. No follow up necessary. °B. Follow up urgent. Contact patient immediately. °C. Follow up necessary. Contact patient and schedule visit in ___ days. °D. Follow up advised. Contact patient and schedule visit in ____weeks. ° °

## 2015-12-24 NOTE — Telephone Encounter (Signed)
LVM for pt to call and schedule  °

## 2015-12-24 NOTE — Telephone Encounter (Signed)
Caitlin, Could you please contact the pt to reschedule. Thanks!

## 2015-12-24 NOTE — Telephone Encounter (Signed)
Please come back for a follow-up appointment in 6 weeks  

## 2016-01-31 ENCOUNTER — Telehealth: Payer: Self-pay | Admitting: Endocrinology

## 2016-01-31 NOTE — Telephone Encounter (Signed)
PT said the prescriptions that Dr. Loanne Drilling recently called in are not covered by insurance (pt didn't know names) he wanted to know alternative options.

## 2016-01-31 NOTE — Telephone Encounter (Signed)
Ov is due.  Let's address then 

## 2016-01-31 NOTE — Telephone Encounter (Signed)
Please read message below and advise.  

## 2016-02-01 NOTE — Telephone Encounter (Signed)
I contacted the pt and advised of note below via vm. Requested a call back from the pt if he would like to move his appointment up from 02/07/2016.

## 2016-02-03 ENCOUNTER — Encounter: Payer: Self-pay | Admitting: Endocrinology

## 2016-02-03 ENCOUNTER — Ambulatory Visit (INDEPENDENT_AMBULATORY_CARE_PROVIDER_SITE_OTHER): Payer: Medicare Other | Admitting: Endocrinology

## 2016-02-03 ENCOUNTER — Telehealth: Payer: Self-pay | Admitting: Endocrinology

## 2016-02-03 VITALS — BP 148/90 | HR 81 | Temp 97.7°F | Wt 219.2 lb

## 2016-02-03 DIAGNOSIS — Z794 Long term (current) use of insulin: Secondary | ICD-10-CM | POA: Diagnosis not present

## 2016-02-03 DIAGNOSIS — E1122 Type 2 diabetes mellitus with diabetic chronic kidney disease: Secondary | ICD-10-CM

## 2016-02-03 DIAGNOSIS — N183 Chronic kidney disease, stage 3 (moderate): Secondary | ICD-10-CM | POA: Diagnosis not present

## 2016-02-03 MED ORDER — INSULIN GLARGINE 100 UNIT/ML SOLOSTAR PEN: 50.0000 [IU] | PEN_INJECTOR | SUBCUTANEOUS | Status: DC

## 2016-02-03 MED ORDER — INSULIN GLARGINE 100 UNIT/ML SOLOSTAR PEN: 30.0000 [IU] | PEN_INJECTOR | SUBCUTANEOUS | Status: DC

## 2016-02-03 NOTE — Patient Instructions (Addendum)
check your blood sugar 5 times a day: before the 3 meals, as needed for symptoms, and at bedtime.  also check if you have symptoms of your blood sugar being too high or too low.  please keep a record of the readings and bring it to your next appointment here.  You can write it on any piece of paper.  please call us sooner if your blood sugar goes below 70, or if you have a lot of readings over 200. Please change the basaglar back to lantus, 50 units at each morning, and:  Please come back for a follow-up appointment in 1 month.

## 2016-02-03 NOTE — Progress Notes (Signed)
Pre visit review using our clinic review tool, if applicable. No additional management support is needed unless otherwise documented below in the visit note. 

## 2016-02-03 NOTE — Telephone Encounter (Signed)
I contacted the pt's wife and advised I spoke with the Pharmacist and the Lantus is approved. The pt was picking the medication up as I was speaking to the pharmacist. She voiced understanding.

## 2016-02-03 NOTE — Telephone Encounter (Signed)
Patient wife ask if you could him back on the Lantus, insurance will not cover the new medication prescribed today, it's saying it need a Prior Auth. b/s is 700 right as moment please advise

## 2016-02-03 NOTE — Progress Notes (Signed)
   Subjective:    Patient ID: Louis Peters, male    DOB: 12/03/1965, 50 y.o.   MRN: [id number]  HPI Pt returns for f/u of diabetes mellitus: DM type: Insulin-requiring type 2 Dx'ed: [id number] Complications: retinopathy and renal insufficiency.  Therapy: insulin since soon after dx DKA: never Severe hypoglycemia: never Pancreatitis: never Other: he takes qd insulin, after poor results with multiple daily injections Interval history: On lantus, pt says his cbg's were low.  He called here 3 days ago, saying basaglar was not covered.  He now says he had run out of insulin 3 days prior to that.  On further discussion, he says he had called ur office 6 days ago, but we have no record of that.  Since then, cbg's were "high."   Past Medical History  Diagnosis Date  . Hypertension   . Diabetes mellitus without complication ([place])     No past surgical history on file.  Social History   Social History  . Marital Status: Married    Spouse Name: N/A  . Number of Children: N/A  . Years of Education: N/A   Occupational History  . Not on file.   Social History Main Topics  . Smoking status: Never Smoker   . Smokeless tobacco: Not on file  . Alcohol Use: Yes     Comment: occ  . Drug Use: No  . Sexual Activity: Not on file   Other Topics Concern  . Not on file   Social History Narrative    Current Outpatient Prescriptions on File Prior to Visit  Medication Sig Dispense Refill  . AZOR 10-40 MG per tablet Take 1 tablet by mouth daily.  0  . ibuprofen (ADVIL,MOTRIN) 200 MG tablet Take 400 mg by mouth every 6 (six) hours as needed for mild pain.     No current facility-administered medications on file prior to visit.    Allergies  Allergen Reactions  . Fexofenadine Rash    unknown unknown unknown  . Latex Rash    Family History  Problem Relation Age of Onset  . Diabetes Maternal Grandmother   . Diabetes Sister     BP 148/90 mmHg  Pulse 81  Temp(Src) 97.7 F (36.5 C)  (Oral)  Wt 219 lb 4 oz (99.451 kg)  SpO2 98%  Review of Systems Denies n/v/sob.      Objective:   Physical Exam VITAL SIGNS:  See vs page.  GENERAL: no distress.      Assessment & Plan:  DM: therapy limited by noncompliance with insulin.    Patient is advised the following: Patient Instructions  check your blood sugar 5 times a day: before the 3 meals, as needed for symptoms, and at bedtime.  also check if you have symptoms of your blood sugar being too high or too low.  please keep a record of the readings and bring it to your next appointment here.  You can write it on any piece of paper.  please call us sooner if your blood sugar goes below 70, or if you have a lot of readings over 200. Please change the basaglar back to lantus, 50 units at each morning, and:  Please come back for a follow-up appointment in 1 month.

## 2016-02-07 ENCOUNTER — Ambulatory Visit: Payer: Medicare Other | Admitting: Endocrinology

## 2016-02-18 DIAGNOSIS — B369 Superficial mycosis, unspecified: Secondary | ICD-10-CM | POA: Insufficient documentation

## 2016-03-06 ENCOUNTER — Ambulatory Visit: Payer: Medicare Other | Admitting: Endocrinology

## 2016-03-06 DIAGNOSIS — Z0289 Encounter for other administrative examinations: Secondary | ICD-10-CM

## 2017-01-25 DIAGNOSIS — H4312 Vitreous hemorrhage, left eye: Secondary | ICD-10-CM | POA: Insufficient documentation

## 2017-01-25 DIAGNOSIS — E103592 Type 1 diabetes mellitus with proliferative diabetic retinopathy without macular edema, left eye: Secondary | ICD-10-CM | POA: Insufficient documentation

## 2017-01-25 DIAGNOSIS — H25813 Combined forms of age-related cataract, bilateral: Secondary | ICD-10-CM | POA: Insufficient documentation

## 2017-04-09 ENCOUNTER — Emergency Department (HOSPITAL_COMMUNITY): Payer: Medicare Other

## 2017-04-09 ENCOUNTER — Emergency Department (HOSPITAL_COMMUNITY)
Admission: EM | Admit: 2017-04-09 | Discharge: 2017-04-10 | Disposition: A | Discharge status: Home / Self Care | Payer: Medicare Other | Attending: Emergency Medicine | Admitting: Emergency Medicine

## 2017-04-09 ENCOUNTER — Encounter (HOSPITAL_COMMUNITY): Payer: Self-pay | Admitting: Emergency Medicine

## 2017-04-09 DIAGNOSIS — M791 Myalgia: Secondary | ICD-10-CM | POA: Diagnosis not present

## 2017-04-09 DIAGNOSIS — R0789 Other chest pain: Secondary | ICD-10-CM | POA: Diagnosis not present

## 2017-04-09 DIAGNOSIS — Z794 Long term (current) use of insulin: Secondary | ICD-10-CM | POA: Insufficient documentation

## 2017-04-09 DIAGNOSIS — I10 Essential (primary) hypertension: Secondary | ICD-10-CM | POA: Insufficient documentation

## 2017-04-09 DIAGNOSIS — Z79899 Other long term (current) drug therapy: Secondary | ICD-10-CM | POA: Diagnosis not present

## 2017-04-09 DIAGNOSIS — R112 Nausea with vomiting, unspecified: Secondary | ICD-10-CM | POA: Diagnosis present

## 2017-04-09 DIAGNOSIS — E119 Type 2 diabetes mellitus without complications: Secondary | ICD-10-CM | POA: Insufficient documentation

## 2017-04-09 LAB — URINALYSIS, ROUTINE W REFLEX MICROSCOPIC
BILIRUBIN URINE: NEGATIVE
Glucose, UA: >=500 mg/dL — AB
Hgb urine dipstick: NEGATIVE
KETONES UR: NEGATIVE mg/dL
Leukocytes, UA: NEGATIVE
NITRITE: NEGATIVE
PH: 5 (ref 5.0–8.0)
Protein, ur: 100 mg/dL — AB
SPECIFIC GRAVITY, URINE: 1.016 (ref 1.005–1.030)

## 2017-04-09 LAB — COMPREHENSIVE METABOLIC PANEL
ALT: 28 U/L (ref 17–63)
ANION GAP: 7 (ref 5–15)
AST: 23 U/L (ref 15–41)
Albumin: 3.9 g/dL (ref 3.5–5.0)
Alkaline Phosphatase: 74 U/L (ref 38–126)
BUN: 20 mg/dL (ref 6–20)
CHLORIDE: 98 mmol/L — AB (ref 101–111)
CO2: 30 mmol/L (ref 22–32)
Calcium: 9.4 mg/dL (ref 8.9–10.3)
Creatinine, Ser: 1.79 mg/dL — ABNORMAL HIGH (ref 0.61–1.24)
GFR, EST AFRICAN AMERICAN: 49 mL/min — AB (ref >60)
GFR, EST NON AFRICAN AMERICAN: 42 mL/min — AB (ref >60)
Glucose, Bld: 352 mg/dL — ABNORMAL HIGH (ref 65–99)
POTASSIUM: 4.2 mmol/L (ref 3.5–5.1)
Sodium: 135 mmol/L (ref 135–145)
Total Bilirubin: 0.8 mg/dL (ref 0.3–1.2)
Total Protein: 6.9 g/dL (ref 6.5–8.1)

## 2017-04-09 LAB — CBC
HEMATOCRIT: 37.8 % — AB (ref 39.0–52.0)
Hemoglobin: 12.6 g/dL — ABNORMAL LOW (ref 13.0–17.0)
MCH: 28.4 pg (ref 26.0–34.0)
MCHC: 33.3 g/dL (ref 30.0–36.0)
MCV: 85.1 fL (ref 78.0–100.0)
PLATELETS: 244 10*3/uL (ref 150–400)
RBC: 4.44 MIL/uL (ref 4.22–5.81)
RDW: 12.7 % (ref 11.5–15.5)
WBC: 7.9 10*3/uL (ref 4.0–10.5)

## 2017-04-09 LAB — I-STAT TROPONIN, ED: TROPONIN I, POC: 0 ng/mL (ref 0.00–0.08)

## 2017-04-09 LAB — LIPASE, BLOOD: LIPASE: 17 U/L (ref 11–51)

## 2017-04-09 NOTE — ED Provider Notes (Signed)
[facility] DEPT Provider Note   CSN: [id number] Arrival date & time: 04/09/17  1729     History   Chief Complaint Chief Complaint  Patient presents with  . Chest Pain  . Emesis    HPI Louis Peters is a 51 y.o. male.  Patient with DM, HTN, HLD describes symptoms that started as nausea and vomiting the morning after eating at cookout, later associated with muscle cramping. Through the course of the next 2-3 days he developed symptoms of chest tightness, right shoulder ache, nausea, sweating and weakness. Symptoms seem to be exertional and he is asymptomatic currently. No fever, cough, diarrhea, headache. No history of cardiac problems or of having seen a cardiologist for any reason.    The history is provided by the patient. No language interpreter was used.    Past Medical History:  Diagnosis Date  . Diabetes mellitus without complication ([place])   . Hypertension     Patient Active Problem List   Diagnosis Date Noted  . Diabetes ([place]) 12/09/2015    History reviewed. No pertinent surgical history.     Home Medications    Prior to Admission medications   Medication Sig Start Date End Date Taking? Authorizing Provider  albuterol (PROVENTIL HFA;VENTOLIN HFA) 108 (90 Base) MCG/ACT inhaler Inhale 2 puffs into the lungs every 6 (six) hours as needed for wheezing or shortness of breath.   Yes [provider]  amLODipine (NORVASC) 10 MG tablet Take 10 mg by mouth daily.  01/16/17  Yes [provider]  atorvastatin (LIPITOR) 20 MG tablet take 1 tablet by mouth once daily 09/26/16  Yes [provider]  HUMALOG KWIKPEN 100 UNIT/ML KiwkPen Inject 5-6 Units into the skin 3 (three) times daily. Take 5 units at breakfast and lunch and Take 6 units at dinner. 02/08/17  Yes [provider]  ibuprofen (ADVIL,MOTRIN) 800 MG tablet Take 800 mg by mouth every 8 (eight) hours as needed for moderate pain.   Yes [provider]  Insulin Glargine  (LANTUS) 100 UNIT/ML Solostar Pen Inject 40 Units into the skin daily at 10 pm.  04/05/17  Yes [provider]  losartan-hydrochlorothiazide (HYZAAR) 100-25 MG tablet Take 1 tablet by mouth daily.  12/04/16 12/04/17 Yes [provider]  montelukast (SINGULAIR) 10 MG tablet Take 10 mg by mouth at bedtime.   Yes [provider]  Insulin Glargine (LANTUS SOLOSTAR) 100 UNIT/ML Solostar Pen Inject 50 Units into the skin every morning. And pen needles 1/day Patient not taking: Reported on 04/09/2017 02/03/16   Renato Shin, MD    Family History Family History  Problem Relation Age of Onset  . Diabetes Maternal Grandmother   . Diabetes Sister     Social History Social History  Substance Use Topics  . Smoking status: Never Smoker  . Smokeless tobacco: Never Used  . Alcohol use Yes     Comment: occ     Allergies   Fexofenadine and Latex   Review of Systems Review of Systems  Constitutional: Positive for diaphoresis. Negative for chills and fever.  HENT: Negative.   Respiratory: Negative.   Cardiovascular: Positive for chest pain ("tightness").  Gastrointestinal: Positive for nausea and vomiting.  Musculoskeletal:       Having muscular cramping in his lower extremities.  Skin: Negative.   Neurological: Negative.  Negative for syncope and headaches.     Physical Exam Updated Vital Signs Ht 5\' 10"  (1.778 m)   Wt 93.9 kg (207 lb)   BMI  29.70 kg/m   Physical Exam  Constitutional: He is oriented to person, place, and time. He appears well-developed and well-nourished.  HENT:  Head: Normocephalic.  Neck: Normal range of motion. Neck supple.  Cardiovascular: Normal rate and regular rhythm.   No murmur heard. Pulmonary/Chest: Effort normal and breath sounds normal. He has no wheezes. He has no rales.  Abdominal: Soft. Bowel sounds are normal. There is no tenderness. There is no rebound and no guarding.  Musculoskeletal: Normal range of motion.  Neurological:  He is alert and oriented to person, place, and time.  Skin: Skin is warm and dry. No rash noted.  Psychiatric: He has a normal mood and affect.     ED Treatments / Results  Labs (all labs ordered are listed, but only abnormal results are displayed) Labs Reviewed  CBC - Abnormal; Notable for the following:       Result Value   Hemoglobin 12.6 (*)    HCT 37.8 (*)    All other components within normal limits  COMPREHENSIVE METABOLIC PANEL - Abnormal; Notable for the following:    Chloride 98 (*)    Glucose, Bld 352 (*)    Creatinine, Ser 1.79 (*)    GFR calc non Af Amer 42 (*)    GFR calc Af Amer 49 (*)    All other components within normal limits  LIPASE, BLOOD  URINALYSIS, ROUTINE W REFLEX MICROSCOPIC  I-STAT TROPOININ, ED   Results for orders placed or performed during the hospital encounter of 04/09/17  CBC  Result Value Ref Range   WBC 7.9 4.0 - 10.5 K/uL   RBC 4.44 4.22 - 5.81 MIL/uL   Hemoglobin 12.6 (L) 13.0 - 17.0 g/dL   HCT 37.8 (L) 39.0 - 52.0 %   MCV 85.1 78.0 - 100.0 fL   MCH 28.4 26.0 - 34.0 pg   MCHC 33.3 30.0 - 36.0 g/dL   RDW 12.7 11.5 - 15.5 %   Platelets 244 150 - 400 K/uL  Lipase, blood  Result Value Ref Range   Lipase 17 11 - 51 U/L  Comprehensive metabolic panel  Result Value Ref Range   Sodium 135 135 - 145 mmol/L   Potassium 4.2 3.5 - 5.1 mmol/L   Chloride 98 (L) 101 - 111 mmol/L   CO2 30 22 - 32 mmol/L   Glucose, Bld 352 (H) 65 - 99 mg/dL   BUN 20 6 - 20 mg/dL   Creatinine, Ser 1.79 (H) 0.61 - 1.24 mg/dL   Calcium 9.4 8.9 - 10.3 mg/dL   Total Protein 6.9 6.5 - 8.1 g/dL   Albumin 3.9 3.5 - 5.0 g/dL   AST 23 15 - 41 U/L   ALT 28 17 - 63 U/L   Alkaline Phosphatase 74 38 - 126 U/L   Total Bilirubin 0.8 0.3 - 1.2 mg/dL   GFR calc non Af Amer 42 (L) >60 mL/min   GFR calc Af Amer 49 (L) >60 mL/min   Anion gap 7 5 - 15  I-stat troponin, ED  Result Value Ref Range   Troponin i, poc 0.00 0.00 - 0.08 ng/mL   Comment 3             EKG  EKG  Interpretation None       Radiology Dg Chest 2 View  Result Date: 04/09/2017 CLINICAL DATA:  Chest pain.  Central chest discomfort. EXAM: CHEST  2 VIEW COMPARISON:  None. FINDINGS: The cardiomediastinal contours are normal. The lungs are clear. EKG  leads project over the chest. Pulmonary vasculature is normal. No consolidation, pleural effusion, or pneumothorax. No acute osseous abnormalities are seen. IMPRESSION: No acute pulmonary process. Electronically Signed   By: Jeb Levering M.D.   On: 04/09/2017 18:23    Procedures Procedures (including critical care time)  Medications Ordered in ED Medications - No data to display   Initial Impression / Assessment and Plan / ED Course  I have reviewed the triage vital signs and the nursing notes.  Pertinent labs & imaging results that were available during my care of the patient were reviewed by me and considered in my medical decision making (see chart for details).     Patient vague about symptoms, wife contributing. He reports he started with GI symptoms and later had symptoms somewhat concerning for cardiac symptoms with chest tightness radiating to right shoulder, sweating, with exertional component. EKG shows nonspecific changes of t-wave inversion in lateral leads. Troponin negative. He is asymptomatic.   He is examined by Dr. Johnney Killian and is found appropriate for discharge home. Will stress regular use of his blood pressure medications. Follow up with primary care for outpatient stress test.   Final Clinical Impressions(s) / ED Diagnoses   Final diagnoses:  None   1. Nausea and vomiting 2. Chest tightness 3. Hypertension   New Prescriptions New Prescriptions   No medications on file     Dennie Bible 04/09/17 2341    Charlesetta Shanks, MD 04/18/17 2039

## 2017-04-09 NOTE — ED Triage Notes (Signed)
Patient got over heated on Friday and been having dizziness, muscle cramps, vomiting since. Patient went to Urgent care today and was told to come to ED since having chest pains.

## 2017-04-09 NOTE — Discharge Instructions (Signed)
It is important for you to take your blood pressure medications as prescribed. Call your doctor to schedule an appointment for further outpatient evaluation including a cardiac stress test, which is highly recommended. Return to the emergency department with any chest pain or concerning change in symptoms.

## 2018-07-01 NOTE — Progress Notes (Signed)
[facility] Note  07/02/2018     CHIEF COMPLAINT Patient presents for Retina Evaluation and Diabetic Eye Exam   HISTORY OF PRESENT ILLNESS: Louis Peters is a 52 y.o. male who presents to the clinic today for:   HPI    Retina Evaluation    In both eyes.  This started 1 year ago.  Associated Symptoms Distortion and Blind Spot.  Negative for Flashes, Photophobia, Fever, Scalp Tenderness, Weight Loss, Jaw Claudication, Glare, Pain, Floaters, Redness, Trauma, Shoulder/Hip pain and Fatigue.  Context:  distance vision, mid-range vision and near vision.  Treatments tried include laser.  Response to treatment was significant improvement.  I, the attending physician,  performed the HPI with the patient and updated documentation appropriately.          Diabetic Eye Exam    Vision fluctuates with blood sugars.  Associated Symptoms Negative for Flashes, Blind Spot, Photophobia, Scalp Tenderness, Fever, Weight Loss, Jaw Claudication, Glare, Pain, Floaters, Distortion, Redness, Trauma, Shoulder/Hip pain and Fatigue.  Diabetes characteristics include Type 1, taking oral medications and on insulin.  This started 30 years ago.  Blood sugar level is uncontrolled.  Last Blood Glucose 101.  Last A1C 11.  Associated Diagnosis Kidney Disease.  I, the attending physician,  performed the HPI with the patient and updated documentation appropriately.          Comments    Referral of Dr. Hinton Lovely for retina eval. Patient states after having laser TX few years ago, "he woke one morning with burred vision" OD,he had Ov with optometrist(unknown MD) no tx voiced. Pt states he recently had OV with DR. Hallahan who dx him with retina detachment, referral for retina eval. Pt is DM1 x 30 yrs, Bs 101 this am, A1C 12. (2 months ago), BS are fluctuates. Pt is on Metformin and insulin.Pt has Dx of Kidney diease.   Denies gtt's/vit's        Last edited by [name], MD on 07/02/2018  1:03  PM. (History)    Pt was seen by Dr. Hinton Lovely due to being referred by PCP; Pt states he was seen by Dr. Zadie Rhine previously and had laser surgery; Pt states he was also seen by Dr. Silvestre Gunner previously and had laser; Pt states he has not been followed by a retinal specialist x 2-3 years; Pt states OU VA has not changed in the last 3-4 years; Pt states OD VA has been poor since 4 years ago, and was being followed by Dr. Zadie Rhine, states he was to have surgical intervention and he states he backed out of surgery with Dr. Zadie Rhine; Pt states he never went back to [name] after he recommended surgical intervention; Pt endorses dx of DM and HTN; Pt states OD is comfortable, denies any ocular pain;   Referring physician: Arlyss Queen, MD [address] [place], [place] [address]  HISTORICAL INFORMATION:   Selected notes from the MEDICAL RECORD NUMBER Referred by Dr. [name] for concern of retinal traction LEE: 09.25.19 ([facility]) [BCVA: OD: CF_0  OS: 20/40] Ocular Hx-vitreous hemorrhage OS, traction detachment OD, PDR OU Previous eye docs: [name] Last visit w/ [name] was in 2016 -- was supposed to schedule surgery OS (PPV, MP, EL) but pt never followed up PMH-DM (last A1C: 11.1, takes humalog, lantus), HTN     CURRENT MEDICATIONS: Current Outpatient Medications (Ophthalmic Drugs)  Medication Sig  . prednisoLONE acetate (PRED FORTE) 1 % ophthalmic suspension Place 1  drop into the left eye 4 (four) times daily for 7 days.   No current facility-administered medications for this visit.  (Ophthalmic Drugs)   Current Outpatient Medications (Other)  Medication Sig  . albuterol (PROVENTIL HFA;VENTOLIN HFA) 108 (90 Base) MCG/ACT inhaler Inhale 2 puffs into the lungs every 6 (six) hours as needed for wheezing or shortness of breath.  Marland Kitchen amLODipine (NORVASC) 10 MG tablet Take 10 mg by mouth daily.   Marland Kitchen atorvastatin (LIPITOR) 20 MG tablet take 1 tablet by mouth once daily  . HUMALOG  KWIKPEN 100 UNIT/ML KiwkPen Inject 5-6 Units into the skin 3 (three) times daily. Take 5 units at breakfast and lunch and Take 6 units at dinner.  Marland Kitchen ibuprofen (ADVIL,MOTRIN) 800 MG tablet Take 800 mg by mouth every 8 (eight) hours as needed for moderate pain.  . Insulin Glargine (LANTUS SOLOSTAR) 100 UNIT/ML Solostar Pen Inject 50 Units into the skin every morning. And pen needles 1/day  . Insulin Glargine (LANTUS) 100 UNIT/ML Solostar Pen Inject 40 Units into the skin daily at 10 pm.   . montelukast (SINGULAIR) 10 MG tablet Take 10 mg by mouth at bedtime.  Marland Kitchen losartan-hydrochlorothiazide (HYZAAR) 100-25 MG tablet Take 1 tablet by mouth daily.    No current facility-administered medications for this visit.  (Other)      REVIEW OF SYSTEMS: ROS    Positive for: Endocrine, Eyes   Negative for: Constitutional, Gastrointestinal, Neurological, Skin, Genitourinary, Musculoskeletal, HENT, Cardiovascular, Respiratory, Psychiatric, Allergic/Imm, Heme/Lymph   Last edited by Zenovia Jordan, LPN on 91/01/568 79:48 AM. (History)       ALLERGIES Allergies  Allergen Reactions  . Fexofenadine Rash    unknown unknown unknown  . Latex Rash    PAST MEDICAL HISTORY Past Medical History:  Diagnosis Date  . Diabetes mellitus without complication ([place])   . Hypertension    Past Surgical History:  Procedure Laterality Date  . CATARACT EXTRACTION    . EYE SURGERY    . RETINAL DETACHMENT SURGERY      FAMILY HISTORY Family History  Problem Relation Age of Onset  . Diabetes Maternal Grandmother   . Diabetes Sister     SOCIAL HISTORY Social History   Tobacco Use  . Smoking status: Never Smoker  . Smokeless tobacco: Never Used  Substance Use Topics  . Alcohol use: Yes    Comment: occ  . Drug use: No         OPHTHALMIC EXAM:  Base Eye Exam    Visual Acuity (Snellen - Linear)      Right Left   Dist cc CF at 3' 20/25   Dist ph cc  NI   Correction:  Glasses       Tonometry  (Tonopen, 9:53 AM)      Right Left   Pressure 22 12       Pupils      Dark Light Shape React APD   Right 4 3 Round Slow None   Left 4 3 Round Brisk None       Visual Fields (Counting fingers)      Left Right    Full    Restrictions  Partial outer superior temporal, inferior temporal, superior nasal, inferior nasal deficiencies       Extraocular Movement      Right Left    Full, Ortho Full, Ortho       Neuro/Psych    Oriented x3:  Yes       Dilation    Both  eyes:  1.0% Mydriacyl, 2.5% Phenylephrine @ 9:52 AM        Slit Lamp and Fundus Exam    Slit Lamp Exam      Right Left   Lids/Lashes Dermatochalasis - upper lid, Meibomian gland dysfunction Dermatochalasis - upper lid, Meibomian gland dysfunction   Conjunctiva/Sclera Temporal Pinguecula, Melanosis Temporal Pinguecula, Melanosis   Cornea Arcus, 1+ Punctate epithelial erosions Arcus, 1+ Punctate epithelial erosions   Anterior Chamber Deep and quiet Deep and quiet   Iris Round and dilated, No NVI Round and dilated, No NVI   Lens 2+ Nuclear sclerosis, 2+ Cortical cataract 2+ Nuclear sclerosis, 2+ Cortical cataract   Vitreous Vitreous syneresis Vitreous syneresis, vitreous condensations over disc, old VH inferiorly        Fundus Exam      Right Left   Disc 1+ Pallor, fibrosis eminating to ST arcade Pink and Sharp, trace Pallor   C/D Ratio  0.5   Macula TRD with vertical fibrotic band extendeding from ST to IT arcades, tractional fibrosis Good foveal reflex, +edema superior macula, Retinal pigment epithelial mottling   Vessels Severe Vascular attenuation, sclerotic arterioles  Vascular attenuation, early arteriorler sclerosis, fibrosis along temporal arcades, fibrotic NVE along distal IT arcade   Periphery 360 PRP laser with room for fill in peripherally  Attached        Refraction    Wearing Rx      Sphere Cylinder Axis Add   Right -0.50 +0.75 045 +1.75   Left -3.00 +1.25 155 +1.75       Manifest Refraction       Sphere Cylinder Axis Dist VA   Right       Left -3.25 +1.25 155 20/30--          IMAGING AND PROCEDURES  Imaging and Procedures for _0 @  OCT, Retina - OU - Both Eyes       Right Eye Quality was good. Central Foveal Thickness: (Unable to obtain). Progression has no prior data. 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   Left Eye Quality was good. Central Foveal Thickness: 233. Progression has no prior data. Findings include abnormal foveal contour, no SRF, intraretinal fluid, vitreous traction (Vitreous traction along ST arcade with retinoschisis, No SRF).   Notes *Images captured and stored on drive  Diagnosis / Impression:  OD: chronic TRD OS: non-central VMT along superotemporal arcades with tractional retinoschisis  Clinical management:  See below  Abbreviations: NFP - Normal foveal profile. CME - cystoid macular edema. PED - pigment epithelial detachment. IRF - intraretinal fluid. SRF - subretinal fluid. EZ - ellipsoid zone. ERM - epiretinal membrane. ORA - outer retinal atrophy. ORT - outer retinal tubulation. SRHM - subretinal hyper-reflective material         Fluorescein Angiography Optos (Transit OS)       Right Eye   Progression has no prior data. Early phase findings include staining. Mid/Late phase findings include leakage, staining.   Left Eye   Progression has no prior data. Early phase findings include retinal neovascularization, staining, leakage. Mid/Late phase findings include staining, leakage, retinal neovascularization.   Notes Images captured and stored on drive;   Impression: Low fluorescein signal OU OD: mild diffuse leakage OS: focal NVE w/ leakage along distal inf temp arcades PDR OU        Panretinal Photocoagulation - OS - Left Eye       LASER PROCEDURE NOTE  Diagnosis:   Proliferative Diabetic Retinopathy,  LEFT EYE  Procedure:   Pan-retinal photocoagulation using slit lamp laser, LEFT EYE, fill-in  Anesthesia:  Topical  Surgeon: [name], MD, PhD   Informed consent obtained, operative eye marked, and time out performed prior to initiation of laser.   Lumenis [id number] slit lamp laser Pattern: 3x3 square Power: 280 mW Duration: 30 msec  Spot size: 200 microns  # spots: 1622 spots 360 far periphery and fill-in  Complications: None.  Notes: old vitreous heme and cortical cataract obscuring view and preventing laser up take inferiorly and scattered focal areas  RTC: 4-6 wks  Patient tolerated the procedure well and received written and verbal post-procedure care information/education.                  ASSESSMENT/PLAN:    ICD-10-CM   1. 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 Retinal edema H35.81 OCT, Retina - OU - Both Eyes  4. Essential hypertension I10   5. Hypertensive retinopathy of both eyes H35.033 Fluorescein Angiography Optos (Transit OS)  6. 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 Proliferative diabetic retinopathy OU OD -- chronic, macula-involving TRD -- CF vision OS -- vitreous traction with macular edema / retinoschisis of superior macula -- BCVA 20/25 - former pt of Dr. Zadie Rhine in 2016 -- records reviewed, at that time, OD was already detached with VA 20/400, OS was to undergo PPV/MP/EL, but pt never had the surgery or followed up with [name] - The incidence, risk factors for progression, natural history and treatment options for diabetic retinopathy were discussed with  patient.   - The need for close monitoring of blood glucose, blood pressure, and serum lipids, avoiding cigarette or any type of tobacco, and the need for long term follow up was also discussed with patient. - FA today (10.1.19) with low fluorescein signal but OS shows obvious NVE, OD with leakage  - OCT shows chronic mac off TRD OD, and OS with tractional retinoschisis / macular edema of superior macula - discussed findings, prognosis and treatment options - recommend PRP fill in OU, OS first today - pt wishes to proceed with laser treatment today 10.1.19 - RBA of procedure discussed, questions answered - informed consent obtained and signed - see procedure note - start PF QID OS x7 days - F/U 4-6 weeks - OS may eventually need PPV / MP to relieve tractional retinoschisis, but with VA 20/25, will monitor for now  3. Chronic TRD OD-  - fovea involving chronic TRD OD -- periphery attached by laser PRP - severe ischemia / arteriolar sclerosis OD -- low vision potential - review of records for [name] indicate mac off TRD has been present since early 2016 -- now 3+ years - discussed findings and poor prognosis - do not recommend surgical intervention OD -- high risk, low benefit - monitor  4,5. Hypertensive retinopathy OU - discussed importance of tight BP control - monitor  5. Combined form age related cataracts OU- - The symptoms of cataract, surgical options, and treatments and risks were discussed with patient. - discussed diagnosis and progression - not yet visually significant - monitor for now   Ophthalmic Meds  Ordered this visit:  Meds ordered this encounter  Medications  . prednisoLONE acetate (PRED FORTE) 1 % ophthalmic suspension    Sig: Place 1 drop into the left eye 4 (four) times daily for 7 days.    Dispense:  10 mL    Refill:  0       Return in about 6 weeks (around 08/13/2018) for F/U PDR OU, DFE, OCT.  There are no Patient Instructions on file for this  visit.   Explained the diagnoses, plan, and follow up with the patient and they expressed understanding.  Patient expressed understanding of the importance of proper follow up care.   This document serves as a record of services personally performed by Gardiner Sleeper, MD, PhD. It was created on their behalf by Ernest Mallick, OA, an ophthalmic assistant. The creation of this record is the provider's dictation and/or activities during the visit.    Electronically signed by: Ernest Mallick, OA  09.30.19 12:02 AM   This document serves as a record of services personally performed by Gardiner Sleeper, MD, PhD. It was created on their behalf by Catha Brow, [place], a certified ophthalmic assistant. The creation of this record is the provider's dictation and/or activities during the visit.  Electronically signed by: Catha Brow, COA  10.01.19 12:02 AM   Gardiner Sleeper, M.D., Ph.D. Diseases & Surgery of the Retina and Vitreous [facility]   I have reviewed the above documentation for accuracy and completeness, and I agree with the above. Gardiner Sleeper, M.D., Ph.D. 07/03/18 12:25 AM     Abbreviations: M myopia (nearsighted); A astigmatism; H hyperopia (farsighted); P presbyopia; Mrx spectacle prescription;  CTL contact lenses; OD right eye; OS left eye; OU both eyes  XT exotropia; ET esotropia; PEK punctate epithelial keratitis; PEE punctate epithelial erosions; DES dry eye syndrome; MGD meibomian gland dysfunction; ATs artificial tears; PFAT's preservative free artificial tears; [place] nuclear sclerotic cataract; PSC posterior subcapsular cataract; ERM epi-retinal membrane; PVD posterior vitreous detachment; RD retinal detachment; DM diabetes mellitus; DR diabetic retinopathy; NPDR non-proliferative diabetic retinopathy; PDR proliferative diabetic retinopathy; CSME clinically significant macular edema; DME diabetic macular edema; dbh dot blot hemorrhages; CWS cotton wool  spot; POAG primary open angle glaucoma; C/D cup-to-disc ratio; HVF humphrey visual field; GVF goldmann visual field; OCT optical coherence tomography; IOP intraocular pressure; BRVO Branch retinal vein occlusion; CRVO central retinal vein occlusion; CRAO central retinal artery occlusion; BRAO branch retinal artery occlusion; RT retinal tear; SB scleral buckle; PPV pars plana vitrectomy; VH Vitreous hemorrhage; PRP panretinal laser photocoagulation; IVK intravitreal kenalog; VMT vitreomacular traction; MH Macular hole;  NVD neovascularization of the disc; NVE neovascularization elsewhere; AREDS age related eye disease study; ARMD age related macular degeneration; POAG primary open angle glaucoma; EBMD epithelial/anterior basement membrane dystrophy; ACIOL anterior chamber intraocular lens; IOL intraocular lens; PCIOL posterior chamber intraocular lens; Phaco/IOL phacoemulsification with intraocular lens placement; [place] photorefractive keratectomy; LASIK laser assisted in situ keratomileusis; HTN hypertension; DM diabetes mellitus; COPD chronic obstructive pulmonary disease

## 2018-07-02 ENCOUNTER — Ambulatory Visit (INDEPENDENT_AMBULATORY_CARE_PROVIDER_SITE_OTHER): Payer: Medicare Other | Admitting: Ophthalmology

## 2018-07-02 ENCOUNTER — Encounter (INDEPENDENT_AMBULATORY_CARE_PROVIDER_SITE_OTHER): Payer: Self-pay | Admitting: Ophthalmology

## 2018-07-02 DIAGNOSIS — E113521 Type 2 diabetes mellitus with proliferative diabetic retinopathy with traction retinal detachment involving the macula, right eye: Secondary | ICD-10-CM

## 2018-07-02 DIAGNOSIS — H35033 Hypertensive retinopathy, bilateral: Secondary | ICD-10-CM

## 2018-07-02 DIAGNOSIS — H25813 Combined forms of age-related cataract, bilateral: Secondary | ICD-10-CM

## 2018-07-02 DIAGNOSIS — E113512 Type 2 diabetes mellitus with proliferative diabetic retinopathy with macular edema, left eye: Secondary | ICD-10-CM

## 2018-07-02 DIAGNOSIS — H3581 Retinal edema: Secondary | ICD-10-CM

## 2018-07-02 DIAGNOSIS — I10 Essential (primary) hypertension: Secondary | ICD-10-CM

## 2018-07-02 MED ORDER — PREDNISOLONE ACETATE 1 % OP SUSP: 1.0000 [drp] | Freq: Four times a day (QID) | OPHTHALMIC | 0 refills | Status: AC

## 2018-07-30 ENCOUNTER — Encounter (INDEPENDENT_AMBULATORY_CARE_PROVIDER_SITE_OTHER): Payer: Medicare Other | Admitting: Ophthalmology

## 2018-07-31 ENCOUNTER — Encounter (INDEPENDENT_AMBULATORY_CARE_PROVIDER_SITE_OTHER): Payer: Medicare Other | Admitting: Ophthalmology

## 2018-07-31 DIAGNOSIS — E1069 Type 1 diabetes mellitus with other specified complication: Secondary | ICD-10-CM | POA: Insufficient documentation

## 2018-07-31 DIAGNOSIS — E785 Hyperlipidemia, unspecified: Secondary | ICD-10-CM | POA: Insufficient documentation

## 2018-07-31 NOTE — Progress Notes (Signed)
[facility] Note  08/01/2018     CHIEF COMPLAINT Patient presents for Retina Follow Up   HISTORY OF PRESENT ILLNESS: IOSEFA Peters is a 52 y.o. male who presents to the clinic today for:   HPI    Retina Follow Up    Patient presents with  Diabetic Retinopathy.  In both eyes.  This started years ago.  Severity is severe.  Duration of years.  Since onset it is stable.  I, the attending physician,  performed the HPI with the patient and updated documentation appropriately.          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  PRP fill-in OS on 10.01.19.  No change in [place] OU.  Denies pain, flashes, floaters.  Finished all gtts.  BS 209 this a.m.  Last A1C 11.1 2 mos ago.       Last edited by Matthew Folks, [place] on 08/01/2018  8:06 AM. (History)    pt states everything went well after laser, he states there is no change in his vision, pt states he had to reschedule a couple times bc he had other doctor appts, pt states he went to see a specialist for his diabetes last week and has a follow up in 4 weeks, pt states his new dr did not prescribe any new meds  Referring physician: No referring provider defined for this encounter.  HISTORICAL INFORMATION:   Selected notes from the MEDICAL RECORD NUMBER Referred by Dr. [name] for concern of retinal traction LEE: 09.25.19 (K. Hallahan) [BCVA: OD: CF_0  OS: 20/40] Ocular Hx-vitreous hemorrhage OS, traction detachment OD, PDR OU Previous eye docs: [name] Last visit w/ [name] was in 2016 -- was supposed to schedule surgery OS (PPV, MP, EL) but pt never followed up PMH-DM (last A1C: 11.1, takes humalog, lantus), HTN     CURRENT MEDICATIONS: No current outpatient medications on file. (Ophthalmic Drugs)   No current facility-administered  medications for this visit.  (Ophthalmic Drugs)   Current Outpatient Medications (Other)  Medication Sig  . albuterol (PROVENTIL HFA;VENTOLIN HFA) 108 (90 Base) MCG/ACT inhaler Inhale 2 puffs into the lungs every 6 (six) hours as needed for wheezing or shortness of breath.  Marland Kitchen amLODipine (NORVASC) 10 MG tablet Take 10 mg by mouth daily.   Marland Kitchen atorvastatin (LIPITOR) 20 MG tablet take 1 tablet by mouth once daily  . Blood Glucose Monitoring Suppl (ONETOUCH VERIO FLEX SYSTEM) w/Device KIT by Does not apply route.  Marland Kitchen glucagon 1 MG injection Inject into the skin.  Marland Kitchen glucose blood (ONETOUCH VERIO) test strip Use to check blood sugar 8 time(s) daily.  Marland Kitchen HUMALOG KWIKPEN 100 UNIT/ML KiwkPen Inject 5-6 Units into the skin 3 (three) times daily. Take 5 units at breakfast and lunch and Take 6 units at dinner.  Marland Kitchen ibuprofen (ADVIL,MOTRIN) 800 MG tablet Take 800 mg by mouth every 8 (eight) hours as needed for moderate pain.  . Insulin Glargine (LANTUS SOLOSTAR) 100 UNIT/ML Solostar Pen Inject 50 Units into the skin every morning. And pen needles 1/day  . Insulin Glargine (LANTUS) 100 UNIT/ML Solostar Pen Inject 40 Units into the skin daily at 10 pm.   . montelukast (SINGULAIR) 10 MG tablet Take 10 mg by mouth at bedtime.  . ONE TOUCH LANCETS MISC Use to check blood  sugar 8 time(s) daily  . losartan-hydrochlorothiazide (HYZAAR) 100-25 MG tablet Take 1 tablet by mouth daily.    No current facility-administered medications for this visit.  (Other)      REVIEW OF SYSTEMS: ROS    Positive for: Endocrine, Eyes   Negative for: Constitutional, Gastrointestinal, Neurological, Skin, Genitourinary, Musculoskeletal, HENT, Cardiovascular, Respiratory, Psychiatric, Allergic/Imm, Heme/Lymph   Last edited by Matthew Folks, COA on 08/01/2018  7:58 AM. (History)       ALLERGIES Allergies  Allergen Reactions  . Fexofenadine Rash    unknown unknown unknown  . Latex Rash    PAST MEDICAL HISTORY Past Medical  History:  Diagnosis Date  . Diabetes mellitus without complication ([place])   . Hypertension    Past Surgical History:  Procedure Laterality Date  . CATARACT EXTRACTION    . EYE SURGERY    . RETINAL DETACHMENT SURGERY      FAMILY HISTORY Family History  Problem Relation Age of Onset  . Diabetes Maternal Grandmother   . Diabetes Sister     SOCIAL HISTORY Social History   Tobacco Use  . Smoking status: Never Smoker  . Smokeless tobacco: Never Used  Substance Use Topics  . Alcohol use: Yes    Comment: occ  . Drug use: No         OPHTHALMIC EXAM:  Base Eye Exam    Visual Acuity (Snellen - Linear)      Right Left   Dist cc CF @ 2' 20/25 -2   Dist ph cc 20/400 NI   Correction:  Glasses       Tonometry (Tonopen, 8:08 AM)      Right Left   Pressure 14 12       Pupils      Dark Light Shape React APD   Right 4 3 Round Slow None   Left 4 3 Round Brisk None       Visual Fields (Counting fingers)      Left Right    Full Full       Extraocular Movement      Right Left    Full, Ortho Full, Ortho       Neuro/Psych    Oriented x3:  Yes   Mood/Affect:  Normal       Dilation    Both eyes:  1.0% Mydriacyl, 2.5% Phenylephrine @ 8:08 AM        Slit Lamp and Fundus Exam    Slit Lamp Exam      Right Left   Lids/Lashes Dermatochalasis - upper lid, Meibomian gland dysfunction Dermatochalasis - upper lid, Meibomian gland dysfunction   Conjunctiva/Sclera Melanosis Melanosis   Cornea Arcus, 1+ Punctate epithelial erosions Arcus, 1+ Punctate epithelial erosions   Anterior Chamber Deep and quiet Deep and quiet   Iris Round and dilated, No NVI Round and dilated, No NVI   Lens 2+ Nuclear sclerosis, 2+ Cortical cataract 2+ Nuclear sclerosis, 2+ Cortical cataract   Vitreous Vitreous syneresis Vitreous syneresis, vitreous condensations over disc, old white VH inferiorly        Fundus Exam      Right Left   Disc 1-2+ Pallor, fibrosis eminating to ST arcade Pink and  Sharp, trace Pallor   C/D Ratio  0.5   Macula TRD with vertical fibrotic band extendeding from ST to IT arcades, tractional fibrosis Good foveal reflex, +edema superior macula, Retinal pigment epithelial mottling   Vessels Severe Vascular attenuation, sclerotic arterioles  Vascular attenuation, early arteriorler sclerosis, fibrosis  along temporal arcades, fibrotic NVE along distal IT arcade   Periphery 360 PRP laser with room for fill in peripherally  Attached, 360 PRP with early PRP fill in changes          IMAGING AND PROCEDURES  Imaging and Procedures for _0 @  OCT, Retina - OU - Both Eyes       Right Eye Quality was good. Central Foveal Thickness: (Unable to obtain). Progression has been stable. 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   Left Eye Quality was good. Central Foveal Thickness: 234. Progression has been stable. Findings include abnormal foveal contour, no SRF, intraretinal fluid, vitreous traction (Vitreous traction along ST arcade with retinoschisis, No SRF).   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 CME - cystoid macular edema. PED - pigment epithelial detachment. IRF - intraretinal fluid. SRF - subretinal fluid. EZ - ellipsoid zone. ERM - epiretinal membrane. ORA - outer retinal atrophy. ORT - outer retinal tubulation. SRHM - subretinal hyper-reflective material                  ASSESSMENT/PLAN:    ICD-10-CM   1. 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 Right eye affected by proliferative  diabetic retinopathy with traction retinal detachment involving macula, associated with type 2 diabetes mellitus ([place]) T73.2202   3. Retinal edema H35.81 OCT, Retina - OU - Both Eyes  4. Essential hypertension I10   5. Hypertensive retinopathy of both eyes H35.033   6. 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 Proliferative diabetic retinopathy OU OD -- chronic, macula-involving TRD -- CF vision OS -- vitreous traction with macular edema / retinoschisis of superior macula -- BCVA 20/25 - former pt of Dr. Zadie Rhine in 2016 -- records reviewed, at that time, OD was already detached with VA 20/400, OS was to undergo PPV/MP/EL, but pt never had the surgery or followed up with [name] - FA (10.1.19) with low fluorescein signal but OS shows obvious NVE, OD with leakage  - S/P PRP fill-in OS (10.01.19) -- good laser surrounding - OCT shows chronic mac off TRD OD, and OS with tractional retinoschisis / macular edema of superior macula -- no interval change today - VA remains stable stable at 20/25 OS - recommend monitoring for now - F/U 6 weeks - OS may eventually need PPV / MP to relieve tractional retinoschisis, but with VA 20/25, will monitor for now  3. Chronic TRD OD-  - fovea involving chronic TRD OD -- periphery attached by laser PRP - severe ischemia / arteriolar sclerosis OD -- low vision potential - review of records for [name] indicate mac off TRD has been present since early 2016 -- now 3+ years - discussed findings and poor prognosis - do not recommend surgical intervention OD -- high risk, low benefit - monitor  4,5. Hypertensive retinopathy OU - discussed importance of tight BP control - monitor  5. Combined form age related cataracts OU- - The symptoms of cataract, surgical options, and treatments and risks were discussed with patient. - discussed diagnosis and progression - not yet visually significant - monitor for  now   Ophthalmic Meds Ordered  this visit:  No orders of the defined types were placed in this encounter.      Return in about 6 weeks (around 09/12/2018) for F/U NPDR OU, DFE, OCT.  There are no Patient Instructions on file for this visit.   Explained the diagnoses, plan, and follow up with the patient and they expressed understanding.  Patient expressed understanding of the importance of proper follow up care.   This document serves as a record of services personally performed by Gardiner Sleeper, MD, PhD. It was created on their behalf by Ernest Mallick, OA, an ophthalmic assistant. The creation of this record is the provider's dictation and/or activities during the visit.    Electronically signed by: Ernest Mallick, OA  10.30.19 9:57 AM    Gardiner Sleeper, M.D., Ph.D. Diseases & Surgery of the Retina and Vitreous [facility]  I have reviewed the above documentation for accuracy and completeness, and I agree with the above. Gardiner Sleeper, M.D., Ph.D. 08/01/18 9:57 AM    Abbreviations: M myopia (nearsighted); A astigmatism; H hyperopia (farsighted); P presbyopia; Mrx spectacle prescription;  CTL contact lenses; OD right eye; OS left eye; OU both eyes  XT exotropia; ET esotropia; PEK punctate epithelial keratitis; PEE punctate epithelial erosions; DES dry eye syndrome; MGD meibomian gland dysfunction; ATs artificial tears; PFAT's preservative free artificial tears; [place] nuclear sclerotic cataract; PSC posterior subcapsular cataract; ERM epi-retinal membrane; PVD posterior vitreous detachment; RD retinal detachment; DM diabetes mellitus; DR diabetic retinopathy; NPDR non-proliferative diabetic retinopathy; PDR proliferative diabetic retinopathy; CSME clinically significant macular edema; DME diabetic macular edema; dbh dot blot hemorrhages; CWS cotton wool spot; POAG primary open angle glaucoma; C/D cup-to-disc ratio; HVF humphrey visual field; GVF goldmann visual field; OCT optical coherence  tomography; IOP intraocular pressure; BRVO Branch retinal vein occlusion; CRVO central retinal vein occlusion; CRAO central retinal artery occlusion; BRAO branch retinal artery occlusion; RT retinal tear; SB scleral buckle; PPV pars plana vitrectomy; VH Vitreous hemorrhage; PRP panretinal laser photocoagulation; IVK intravitreal kenalog; VMT vitreomacular traction; MH Macular hole;  NVD neovascularization of the disc; NVE neovascularization elsewhere; AREDS age related eye disease study; ARMD age related macular degeneration; POAG primary open angle glaucoma; EBMD epithelial/anterior basement membrane dystrophy; ACIOL anterior chamber intraocular lens; IOL intraocular lens; PCIOL posterior chamber intraocular lens; Phaco/IOL phacoemulsification with intraocular lens placement; [place] photorefractive keratectomy; LASIK laser assisted in situ keratomileusis; HTN hypertension; DM diabetes mellitus; COPD chronic obstructive pulmonary disease

## 2018-08-01 ENCOUNTER — Encounter (INDEPENDENT_AMBULATORY_CARE_PROVIDER_SITE_OTHER): Payer: Self-pay | Admitting: Ophthalmology

## 2018-08-01 ENCOUNTER — Ambulatory Visit (INDEPENDENT_AMBULATORY_CARE_PROVIDER_SITE_OTHER): Payer: Medicare Other | Admitting: Ophthalmology

## 2018-08-01 DIAGNOSIS — H3581 Retinal edema: Secondary | ICD-10-CM

## 2018-08-01 DIAGNOSIS — I10 Essential (primary) hypertension: Secondary | ICD-10-CM

## 2018-08-01 DIAGNOSIS — E113521 Type 2 diabetes mellitus with proliferative diabetic retinopathy with traction retinal detachment involving the macula, right eye: Secondary | ICD-10-CM

## 2018-08-01 DIAGNOSIS — E113512 Type 2 diabetes mellitus with proliferative diabetic retinopathy with macular edema, left eye: Secondary | ICD-10-CM | POA: Diagnosis not present

## 2018-08-01 DIAGNOSIS — H25813 Combined forms of age-related cataract, bilateral: Secondary | ICD-10-CM

## 2018-08-01 DIAGNOSIS — H35033 Hypertensive retinopathy, bilateral: Secondary | ICD-10-CM

## 2018-08-10 DIAGNOSIS — L089 Local infection of the skin and subcutaneous tissue, unspecified: Secondary | ICD-10-CM | POA: Insufficient documentation

## 2018-09-02 ENCOUNTER — Encounter (INDEPENDENT_AMBULATORY_CARE_PROVIDER_SITE_OTHER): Payer: Medicare Other | Admitting: Ophthalmology

## 2018-09-13 NOTE — Progress Notes (Addendum)
[facility] Note  09/16/2018     CHIEF COMPLAINT Patient presents for Retina Follow Up   HISTORY OF PRESENT ILLNESS: Louis Peters is a 52 y.o. male who presents to the clinic today for:   HPI    Retina Follow Up    Patient presents with  Diabetic Retinopathy.  In both eyes.  This started 7.  Duration of 7 weeks.  Since onset it is stable.  I, the attending physician,  performed the HPI with the patient and updated documentation appropriately.          Comments    Patient here for 7 weeks PDR OU (s/p PRP 10/01) Patient states  Vision doing the same. No eye pain.Patient has a toe amputated on left foot about 2 weeks ago.       Last edited by [name], MD on 09/16/2018 10:46 AM. (History)    Pt states he had a toe amputated on his left foot 2 weeks ago, he states he got an infection about 2 months ago that never healed, pt does not have any new complaints about his vision, he denies eye pain, pt states he did not have any problems after his laser procedure last time  Referring physician: Arlyss Queen, MD [address] [place], [place] [address]  HISTORICAL INFORMATION:   Selected notes from the [facility] Referred by Dr. Aron Peters for concern of retinal traction LEE: 09.25.19 (K. Hallahan) [BCVA: OD: CF_0  OS: 20/40] Ocular Hx-vitreous hemorrhage OS, traction detachment OD, PDR OU Previous eye docs: [name] Last visit w/ [name] was in 2016 -- was supposed to schedule surgery OS (PPV, MP, EL) but pt never followed up PMH-DM (last A1C: 11.1, takes humalog, lantus), HTN     CURRENT MEDICATIONS: No current outpatient medications on file. (Ophthalmic Drugs)   No current facility-administered medications for this visit.  (Ophthalmic Drugs)   Current Outpatient Medications (Other)  Medication Sig  . albuterol (PROVENTIL HFA;VENTOLIN HFA) 108 (90 Base) MCG/ACT inhaler Inhale 2 puffs into the lungs  every 6 (six) hours as needed for wheezing or shortness of breath.  Marland Kitchen amLODipine (NORVASC) 10 MG tablet Take 10 mg by mouth daily.   Marland Kitchen atorvastatin (LIPITOR) 20 MG tablet take 1 tablet by mouth once daily  . Blood Glucose Monitoring Suppl (ONETOUCH VERIO FLEX SYSTEM) w/Device KIT by Does not apply route.  Marland Kitchen glucagon 1 MG injection Inject into the skin.  Marland Kitchen glucose blood (ONETOUCH VERIO) test strip Use to check blood sugar 8 time(s) daily.  Marland Kitchen HUMALOG KWIKPEN 100 UNIT/ML KiwkPen Inject 5-6 Units into the skin 3 (three) times daily. Take 5 units at breakfast and lunch and Take 6 units at dinner.  Marland Kitchen ibuprofen (ADVIL,MOTRIN) 800 MG tablet Take 800 mg by mouth every 8 (eight) hours as needed for moderate pain.  . Insulin Glargine (LANTUS SOLOSTAR) 100 UNIT/ML Solostar Pen Inject 50 Units into the skin every morning. And pen needles 1/day  . Insulin Glargine (LANTUS) 100 UNIT/ML Solostar Pen Inject 40 Units into the skin daily at 10 pm.   . losartan-hydrochlorothiazide (HYZAAR) 100-25 MG tablet Take 1 tablet by mouth daily.   . montelukast (SINGULAIR) 10 MG tablet Take 10 mg by mouth at bedtime.  . ONE TOUCH LANCETS MISC Use to check blood sugar 8 time(s) daily   No current facility-administered medications for this visit.  (Other)      REVIEW OF SYSTEMS: ROS  Positive for: Musculoskeletal, Endocrine, Eyes   Negative for: Constitutional, Gastrointestinal, Neurological, Skin, Genitourinary, HENT, Cardiovascular, Respiratory, Psychiatric, Allergic/Imm, Heme/Lymph   Last edited by [name], MD on 09/16/2018 10:48 AM. (History)       ALLERGIES Allergies  Allergen Reactions  . Fexofenadine Rash    unknown unknown unknown  . Latex Rash    PAST MEDICAL HISTORY Past Medical History:  Diagnosis Date  . Diabetes mellitus without complication ([place])   . Hypertension    Past Surgical History:  Procedure Laterality Date  . CATARACT EXTRACTION    . EYE SURGERY    . RETINAL DETACHMENT  SURGERY      FAMILY HISTORY Family History  Problem Relation Age of Onset  . Diabetes Maternal Grandmother   . Diabetes Sister     SOCIAL HISTORY Social History   Tobacco Use  . Smoking status: Never Smoker  . Smokeless tobacco: Never Used  Substance Use Topics  . Alcohol use: Yes    Comment: occ  . Drug use: No         OPHTHALMIC EXAM:  Base Eye Exam    Visual Acuity (Snellen - Linear)      Right Left   Dist cc 20/250 -2 20/25 -2   Dist ph cc NI NI   Correction:  Glasses       Tonometry (Tonopen, 10:09 AM)      Right Left   Pressure 17 15       Pupils      Dark Light Shape React APD   Right 4 3 Round Slow None   Left 4 3 Round Brisk None       Visual Fields (Counting fingers)      Left Right    Full Full       Extraocular Movement      Right Left    Full, Ortho Full, Ortho       Neuro/Psych    Oriented x3:  Yes   Mood/Affect:  Normal       Dilation    Both eyes:  1.0% Mydriacyl, 2.5% Phenylephrine @ 10:09 AM        Slit Lamp and Fundus Exam    Slit Lamp Exam      Right Left   Lids/Lashes Dermatochalasis - upper lid, Meibomian gland dysfunction Dermatochalasis - upper lid, Meibomian gland dysfunction   Conjunctiva/Sclera Melanosis Melanosis   Cornea Arcus, 1+ Punctate epithelial erosions Arcus, 1+ Punctate epithelial erosions   Anterior Chamber Deep and quiet Deep and quiet   Iris Round and dilated, No NVI Round and dilated, No NVI   Lens 2+ Nuclear sclerosis, 2+ Cortical cataract 2+ Nuclear sclerosis, 2-3+ Cortical cataract   Vitreous Vitreous syneresis Vitreous syneresis, superior fibrosis with mild traction along ST arcades       Fundus Exam      Right Left   Disc 1-2+ Pallor, fibrosis eminating to ST arcade Pink and Sharp, trace Pallor   C/D Ratio  0.4   Macula TRD with vertical fibrotic band extending from ST to IT arcades, tractional fibrosis Good foveal reflex, +edema superior macula, Retinal pigment epithelial mottling    Vessels Severe Vascular attenuation, sclerotic arterioles  Vascular attenuation, early arteriolar sclerosis, fibrosis along temporal arcades, fibrotic NVE along distal IT arcade   Periphery Attached peripherally with 360 PRP laser with room for fill in peripherally  Attached, tractional retinoschisis along superior arcades; 360 PRP with room for posterior fill in superiorly towards schisis  Refraction    Wearing Rx      Sphere Cylinder Axis Add   Right -0.50 +0.75 045 +1.75   Left -3.00 +1.25 155 +1.75          IMAGING AND PROCEDURES  Imaging and Procedures for _0 @  OCT, Retina - OU - Both Eyes       Right Eye Quality was good. Central Foveal Thickness: (Unable to obtain). Progression has been stable. 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   Left Eye Quality was good. Central Foveal Thickness: 232. Progression has been stable. Findings include abnormal foveal contour, no SRF, intraretinal fluid, vitreous traction (Vitreous traction along ST arcade with retinoschisis, No SRF).   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 CME - cystoid macular edema. PED - pigment epithelial detachment. IRF - intraretinal fluid. SRF - subretinal fluid. EZ - ellipsoid zone. ERM - epiretinal membrane. ORA - outer retinal atrophy. ORT - outer retinal tubulation. SRHM - subretinal hyper-reflective material         Panretinal Photocoagulation - OS - Left Eye       LASER PROCEDURE NOTE  Diagnosis:   Proliferative Diabetic Retinopathy, LEFT EYE  Procedure:  Pan-retinal photocoagulation using slit lamp laser,  LEFT EYE, fill-in  Anesthesia:  Topical  Surgeon: [name], MD, PhD   Informed consent obtained, operative eye marked, and time out performed prior to initiation of laser.   Lumenis [id number] slit lamp laser Pattern: 3x3 square Power: 270 mW Duration: 30 msec  Spot size: 200 microns  # spots: 1224 spots fill-in mostly superior  Complications: None.  Notes: old vitreous heme and cortical cataract obscuring view and preventing laser up take inferiorly and scattered focal areas  RTC: 4-6 wks  Patient tolerated the procedure well and received written and verbal post-procedure care information/education.                  ASSESSMENT/PLAN:    ICD-10-CM   1. 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 Retinal edema H35.81 OCT, Retina - OU - Both Eyes  4. Essential hypertension I10   5. Hypertensive retinopathy of both eyes H35.033   6. 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 Proliferative diabetic retinopathy OU OD -- chronic, macula-involving TRD -- CF vision OS -- vitreous traction with macular edema / retinoschisis of superior macula -- BCVA 20/25 - former pt of Dr. Zadie Rhine in 2016 -- records reviewed, at that time, OD was already detached with VA 20/400, OS was to undergo PPV/MP/EL, but pt never had the surgery or followed up with [name] - FA (10.1.19) with low fluorescein signal but OS shows obvious NVE, OD with leakage  - OCT shows chronic mac off TRD OD, and OS with tractional retinoschisis / macular edema of superior macula -- no interval change today - VA remains stable at 20/25 OS - S/P PRP fill-in OS (10.01.19) -- good laser in place, but  room posteriorly around tractional schisis - recommend PRP fill in today, 12.16.19 - F/U 4-6 weeks, OCT, DFE, FA (optos transit OS) - OS may eventually need PPV / MP to relieve tractional retinoschisis, but with VA 20/25, will monitor for now  3. Chronic TRD OD-  - fovea involving chronic TRD OD -- periphery attached by laser PRP - severe ischemia / arteriolar sclerosis OD -- low vision potential - review of records for [name] indicate mac off TRD has been present since early 2016 -- now 3+ years - discussed findings and poor prognosis - do not recommend surgical intervention OD -- high risk, low benefit - pt not interested in surgical intervention at this time - monitor  4,5. Hypertensive retinopathy OU - discussed importance of tight BP control - monitor  5. Combined form age related cataracts OU- - The symptoms of cataract, surgical options, and treatments and risks were discussed with patient. - discussed diagnosis and progression - not yet visually significant - monitor   Ophthalmic Meds Ordered this visit:  No orders of the defined types were placed in this encounter.      Return for F/U 4-6 weeks, PDR OU, DFE, OCT.  There are no Patient Instructions on file for this visit.   Explained the diagnoses, plan, and follow up with the patient and they expressed understanding.  Patient expressed understanding of the importance of proper follow up care.   This document serves as a record of services personally performed by Louis Sleeper, MD, PhD. It was created on their behalf by Ernest Mallick, OA, an ophthalmic assistant. The creation of this record is the provider's dictation and/or activities during the visit.    Electronically signed by: Ernest Mallick, OA  12.13.19 4:44 PM    Louis Peters, M.D., Ph.D. Diseases & Surgery of the Retina and Vitreous [facility]  I have reviewed the above documentation for accuracy and completeness, and I agree  with the above. Louis Peters, M.D., Ph.D. 09/17/18 4:44 PM     Abbreviations: M myopia (nearsighted); A astigmatism; H hyperopia (farsighted); P presbyopia; Mrx spectacle prescription;  CTL contact lenses; OD right eye; OS left eye; OU both eyes  XT exotropia; ET esotropia; PEK punctate epithelial keratitis; PEE punctate epithelial erosions; DES dry eye syndrome; MGD meibomian gland dysfunction; ATs artificial tears; PFAT's preservative free artificial tears; [place] nuclear sclerotic cataract; PSC posterior subcapsular cataract; ERM epi-retinal membrane; PVD posterior vitreous detachment; RD retinal detachment; DM diabetes mellitus; DR diabetic retinopathy; NPDR non-proliferative diabetic retinopathy; PDR proliferative diabetic retinopathy; CSME clinically significant macular edema; DME diabetic macular edema; dbh dot blot hemorrhages; CWS cotton wool spot; POAG primary open angle glaucoma; C/D cup-to-disc ratio; HVF humphrey visual field; GVF goldmann visual field; OCT optical coherence tomography; IOP intraocular pressure; BRVO Branch retinal vein occlusion; CRVO central retinal vein occlusion; CRAO central retinal artery occlusion; BRAO branch retinal artery occlusion; RT retinal tear; SB scleral buckle; PPV pars plana vitrectomy; VH Vitreous hemorrhage; PRP panretinal laser photocoagulation; IVK intravitreal kenalog; VMT vitreomacular traction; MH Macular hole;  NVD neovascularization of the disc; NVE neovascularization elsewhere; AREDS age related eye disease study; ARMD age related macular degeneration; POAG primary open angle glaucoma; EBMD epithelial/anterior basement membrane dystrophy; ACIOL anterior chamber intraocular lens; IOL intraocular lens; PCIOL posterior chamber intraocular lens; Phaco/IOL phacoemulsification with intraocular lens placement; [place] photorefractive keratectomy; LASIK laser assisted in situ keratomileusis; HTN hypertension; DM diabetes mellitus; COPD chronic obstructive pulmonary  disease

## 2018-09-16 ENCOUNTER — Ambulatory Visit (INDEPENDENT_AMBULATORY_CARE_PROVIDER_SITE_OTHER): Payer: Medicare Other | Admitting: Ophthalmology

## 2018-09-16 DIAGNOSIS — I10 Essential (primary) hypertension: Secondary | ICD-10-CM | POA: Diagnosis not present

## 2018-09-16 DIAGNOSIS — H3581 Retinal edema: Secondary | ICD-10-CM | POA: Diagnosis not present

## 2018-09-16 DIAGNOSIS — E113521 Type 2 diabetes mellitus with proliferative diabetic retinopathy with traction retinal detachment involving the macula, right eye: Secondary | ICD-10-CM

## 2018-09-16 DIAGNOSIS — E113512 Type 2 diabetes mellitus with proliferative diabetic retinopathy with macular edema, left eye: Secondary | ICD-10-CM | POA: Diagnosis not present

## 2018-09-16 DIAGNOSIS — H25813 Combined forms of age-related cataract, bilateral: Secondary | ICD-10-CM

## 2018-09-16 DIAGNOSIS — H35033 Hypertensive retinopathy, bilateral: Secondary | ICD-10-CM

## 2018-09-17 ENCOUNTER — Encounter (INDEPENDENT_AMBULATORY_CARE_PROVIDER_SITE_OTHER): Payer: Self-pay | Admitting: Ophthalmology

## 2018-10-14 ENCOUNTER — Ambulatory Visit (INDEPENDENT_AMBULATORY_CARE_PROVIDER_SITE_OTHER): Payer: Medicare Other | Admitting: Ophthalmology

## 2018-10-14 ENCOUNTER — Encounter (INDEPENDENT_AMBULATORY_CARE_PROVIDER_SITE_OTHER): Payer: Self-pay | Admitting: Ophthalmology

## 2018-10-14 ENCOUNTER — Encounter (INDEPENDENT_AMBULATORY_CARE_PROVIDER_SITE_OTHER): Payer: Medicare Other | Admitting: Ophthalmology

## 2018-10-14 DIAGNOSIS — I10 Essential (primary) hypertension: Secondary | ICD-10-CM | POA: Diagnosis not present

## 2018-10-14 DIAGNOSIS — H3581 Retinal edema: Secondary | ICD-10-CM

## 2018-10-14 DIAGNOSIS — E113512 Type 2 diabetes mellitus with proliferative diabetic retinopathy with macular edema, left eye: Secondary | ICD-10-CM

## 2018-10-14 DIAGNOSIS — H35033 Hypertensive retinopathy, bilateral: Secondary | ICD-10-CM

## 2018-10-14 DIAGNOSIS — E113521 Type 2 diabetes mellitus with proliferative diabetic retinopathy with traction retinal detachment involving the macula, right eye: Secondary | ICD-10-CM

## 2018-10-14 DIAGNOSIS — H25813 Combined forms of age-related cataract, bilateral: Secondary | ICD-10-CM

## 2018-10-14 MED ORDER — PREDNISOLONE ACETATE 1 % OP SUSP: 1.0000 [drp] | Freq: Four times a day (QID) | OPHTHALMIC | 0 refills | Status: AC

## 2018-10-14 NOTE — Progress Notes (Addendum)
[facility] Note  10/14/2018     CHIEF COMPLAINT Patient presents for Retina Follow Up   HISTORY OF PRESENT ILLNESS: Louis Peters is a 53 y.o. male who presents to the clinic today for:   HPI    Retina Follow Up    In left eye.  This started 6 months ago.  Severity is mild.  Since onset it is stable.  I, the attending physician,  performed the HPI with the patient and updated documentation appropriately.          Comments    F/U PDR OU. Patient states his vision has been "good", denies new visual Issues,BS 291 after eating this Am.       Last edited by [name], MD on 10/14/2018 12:24 PM. (History)    pt states he has not had any problems with his vision or eyes since last visit  Referring physician: No referring provider defined for this encounter.  HISTORICAL INFORMATION:   Selected notes from the MEDICAL RECORD NUMBER Referred by Dr. [name] for concern of retinal traction LEE: 09.25.19 (K. 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 prednisoLONE acetate (PRED FORTE) 1 % ophthalmic suspension Place 1 drop into the left eye 4 (four) times daily for 7 days.   No current facility-administered medications for this visit.  (Ophthalmic Drugs)   Current Outpatient Medications (Other)  Medication Sig  . albuterol (PROVENTIL HFA;VENTOLIN HFA) 108 (90 Base) MCG/ACT inhaler Inhale 2 puffs into the lungs every 6 (six) hours as needed for wheezing or shortness of breath.  Marland Kitchen amLODipine (NORVASC) 10 MG tablet Take 10 mg by mouth daily.   Marland Kitchen atorvastatin (LIPITOR) 20 MG tablet take 1  tablet by mouth once daily  . Blood Glucose Monitoring Suppl (ONETOUCH VERIO FLEX SYSTEM) w/Device KIT by Does not apply route.  Marland Kitchen glucagon 1 MG injection Inject into the skin.  Marland Kitchen glucose blood (ONETOUCH VERIO) test strip Use to check blood sugar 8 time(s) daily.  Marland Kitchen HUMALOG KWIKPEN 100 UNIT/ML KiwkPen Inject 5-6 Units into the skin 3 (three) times daily. Take 5 units at breakfast and lunch and Take 6 units at dinner.  Marland Kitchen ibuprofen (ADVIL,MOTRIN) 800 MG tablet Take 800 mg by mouth every 8 (eight) hours as needed for moderate pain.  . Insulin Glargine (LANTUS SOLOSTAR) 100 UNIT/ML Solostar Pen Inject 50 Units into the skin every morning. And pen needles 1/day  . Insulin Glargine (LANTUS) 100 UNIT/ML Solostar Pen Inject 40 Units into the skin daily at 10 pm.   . montelukast (SINGULAIR) 10 MG tablet Take 10 mg by mouth at bedtime.  . ONE TOUCH LANCETS MISC Use to check blood sugar 8 time(s) daily  . losartan-hydrochlorothiazide (HYZAAR) 100-25 MG tablet Take 1 tablet by mouth daily.    No current facility-administered medications for this visit.  (Other)      REVIEW OF SYSTEMS: ROS    Positive for: Endocrine, Eyes   Negative for: Constitutional, Gastrointestinal, Neurological, Skin, Genitourinary, Musculoskeletal, HENT, Cardiovascular, Respiratory, Psychiatric, Allergic/Imm, Heme/Lymph   Last edited by Zenovia Jordan, LPN on 01/08/8118 14:78 AM. (History)  ALLERGIES Allergies  Allergen Reactions  . Fexofenadine Rash    unknown unknown unknown  . Latex Rash    PAST MEDICAL HISTORY Past Medical History:  Diagnosis Date  . Diabetes mellitus without complication ([place])   . Hypertension    Past Surgical History:  Procedure Laterality Date  . CATARACT EXTRACTION    . EYE SURGERY    . RETINAL DETACHMENT SURGERY      FAMILY HISTORY Family History  Problem Relation Age of Onset  . Diabetes Maternal Grandmother   . Diabetes Sister     SOCIAL HISTORY Social History    Tobacco Use  . Smoking status: Never Smoker  . Smokeless tobacco: Never Used  Substance Use Topics  . Alcohol use: Yes    Comment: occ  . Drug use: No         OPHTHALMIC EXAM:  Base Eye Exam    Visual Acuity (Snellen - Linear)      Right Left   Dist cc 20/400 20/30 -2   Dist ph cc NI NI   Correction:  Glasses       Tonometry (Tonopen, 10:44 AM)      Right Left   Pressure 18 14       Pupils      Dark Light Shape React APD   Right 4 3 Round Brisk None   Left 4 3 Round Brisk None       Visual Fields (Counting fingers)      Left Right    Full    Restrictions  Partial outer superior temporal deficiency       Extraocular Movement      Right Left    Full, Ortho Full, Ortho       Neuro/Psych    Oriented x3:  Yes   Mood/Affect:  Normal       Dilation    Both eyes:  1.0% Mydriacyl, 2.5% Phenylephrine @ 10:39 AM        Slit Lamp and Fundus Exam    Slit Lamp Exam      Right Left   Lids/Lashes Dermatochalasis - upper lid, Meibomian gland dysfunction Dermatochalasis - upper lid, Meibomian gland dysfunction   Conjunctiva/Sclera Melanosis Melanosis   Cornea Arcus, 1+ Punctate epithelial erosions, mild endopigment Arcus, 1+ Punctate epithelial erosions   Anterior Chamber Deep and quiet Deep and quiet   Iris Round and dilated, No NVI Round and dilated, No NVI   Lens 2+ Nuclear sclerosis, 3+ Cortical cataract 2+ Nuclear sclerosis, 3+ Cortical cataract   Vitreous Vitreous syneresis Vitreous syneresis, superior fibrosis with mild traction along ST arcades       Fundus Exam      Right Left   Disc 1-2+ Pallor, fibrosis eminating to ST arcade sharp rim, mild pallor   C/D Ratio  0.4   Macula TRD with vertical fibrotic band extending from ST to IT arcades, tractional fibrosis Good foveal reflex, +edema superior macula, Retinal pigment epithelial mottling   Vessels Severe Vascular attenuation, sclerotic arterioles, +fibrotic NV along arcades - inactive Vascular  attenuation, early arteriolar sclerosis, fibrosis along temporal arcades, fibrotic NVE along distal IT arcade   Periphery Attached peripherally with 360 PRP laser with room for fill in peripherally  Attached, tractional retinoschisis along superior arcades; 360 PRP with room for posterior fill in superiorly towards schisis          IMAGING AND PROCEDURES  Imaging and Procedures for @TODAY @  OCT, Retina - OU - Both Eyes  Right Eye Quality was good. Central Foveal Thickness: (Unable to obtain). Progression has been stable. 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   Left Eye Quality was good. Central Foveal Thickness: 234. Progression has been stable. Findings include abnormal foveal contour, no SRF, intraretinal fluid, vitreous traction (Vitreous traction along ST arcade with retinoschisis, No SRF -- no interval change from prior).   Notes *Images captured and stored on drive  Diagnosis / Impression:  OD: chronic TRD, +SRF OS: non-central VMT along superotemporal arcades with tractional retinoschisis OU: No significant interval change from prior  Clinical management:  See below  Abbreviations: NFP - Normal foveal profile. CME - cystoid macular edema. PED - pigment epithelial detachment. IRF - intraretinal fluid. SRF - subretinal fluid. EZ - ellipsoid zone. ERM - epiretinal membrane. ORA - outer retinal atrophy. ORT - outer retinal tubulation. SRHM - subretinal hyper-reflective material         Fluorescein Angiography Optos (Transit OS)       Right Eye   Progression has been stable. Early phase findings include staining, leakage, retinal neovascularization. Mid/Late phase findings include leakage, staining, retinal neovascularization.   Left Eye   Progression has been stable. Early phase findings include  retinal neovascularization, staining, microaneurysm, leakage. Mid/Late phase findings include staining, leakage, retinal neovascularization, microaneurysm.   Notes Images captured and stored on drive;   Impression: OD: mild leakage OS: focal NVE w/ persistnet leakage along distal inf temp arcades PDR OU        Panretinal Photocoagulation - OS - Left Eye       LASER PROCEDURE NOTE  Diagnosis:   Proliferative Diabetic Retinopathy, LEFT EYE  Procedure:  Pan-retinal photocoagulation using slit lamp laser, LEFT EYE, fill-in  Anesthesia:  Topical  Surgeon: [name], MD, PhD   Informed consent obtained, operative eye marked, and time out performed prior to initiation of laser.   Lumenis [id number] slit lamp laser Pattern: 2x2 square Power: 24 mW Duration: 30 msec  Spot size: 200 microns  # spots: 352 spots fill-in around NVE along distal IT arcades  Complications: None.   RTC: 4-6 wks  Patient tolerated the procedure well and received written and verbal post-procedure care information/education.                  ASSESSMENT/PLAN:    ICD-10-CM   1. 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 Retinal edema H35.81 OCT, Retina - OU - Both Eyes  3. 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 Essential hypertension I10   5. Hypertensive retinopathy of both eyes H35.033 Fluorescein Angiography Optos (Transit OS)  6. 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 Proliferative diabetic retinopathy OU OD -- chronic, macula-involving TRD -- 20/400 vision -- severe retinal ischemia OS -- vitreous traction with  macular edema / retinoschisis of superior macula -- BCVA 20/30-2 - former pt of Dr. Zadie Rhine in 2016 -- records reviewed, at that time, OD was already detached with VA 20/400, OS was to undergo PPV/MP/EL, but pt never had the surgery or followed up with [name] - FA (01.13.20) shows  active NVE OS, OD with minimal leakage  - OCT shows chronic mac off TRD OD, and OS with tractional retinoschisis / macular edema of superior macula -- no interval change today - VA slightly decreased to 20/30-2 from 20/25 OS - S/P PRP fill-in OS (10.01.19) -- good laser in place, but needs tighter laser around IT NVE - recommend PRP OS fill in today, 01.13.20 - F/U 4-6 weeks, OCT, DFE - OS may eventually need PPV / MP to relieve tractional retinoschisis, but with VA 20/30, will monitor for now  3. Chronic TRD OD-  - fovea involving chronic TRD OD -- periphery attached by laser PRP - severe ischemia / arteriolar sclerosis OD -- low vision potential - review of records for [name] indicate mac off TRD has been present since early 2016 -- now 3+ years - discussed findings and poor prognosis - do not recommend surgical intervention OD -- high risk, low benefit - pt not interested in surgical intervention at this time - monitor  4,5. Hypertensive retinopathy OU - discussed importance of tight BP control - monitor  5. Combined form age related cataracts OU- - The symptoms of cataract, surgical options, and treatments and risks were discussed with patient. - discussed diagnosis and progression - not yet visually significant - monitor   Ophthalmic Meds Ordered this visit:  Meds ordered this encounter  Medications  . prednisoLONE acetate (PRED FORTE) 1 % ophthalmic suspension    Sig: Place 1 drop into the left eye 4 (four) times daily for 7 days.    Dispense:  10 mL    Refill:  0       Return for 4-6 wks; DFE/OCT.  There are no Patient Instructions on file for this visit.   Explained the diagnoses, plan,  and follow up with the patient and they expressed understanding.  Patient expressed understanding of the importance of proper follow up care.   This document serves as a record of services personally performed by Gardiner Sleeper, MD, PhD. It was created on their behalf by Ernest Mallick, OA, an ophthalmic assistant. The creation of this record is the provider's dictation and/or activities during the visit.    Electronically signed by: Ernest Mallick, OA  01.13.2020 12:46 PM    Gardiner Sleeper, M.D., Ph.D. Diseases & Surgery of the Retina and Vitreous [facility]  I have reviewed the above documentation for accuracy and completeness, and I agree with the above. Gardiner Sleeper, M.D., Ph.D. 10/14/18 12:46 PM   Abbreviations: M myopia (nearsighted); A astigmatism; H hyperopia (farsighted); P presbyopia; Mrx spectacle prescription;  CTL contact lenses; OD right eye; OS left eye; OU both eyes  XT exotropia; ET esotropia; PEK punctate epithelial keratitis; PEE punctate epithelial erosions; DES dry eye syndrome; MGD meibomian gland dysfunction; ATs artificial tears; PFAT's preservative free artificial tears; [place] nuclear sclerotic cataract; PSC posterior subcapsular cataract; ERM epi-retinal membrane; PVD posterior vitreous detachment; RD retinal detachment; DM diabetes mellitus; DR diabetic retinopathy; NPDR non-proliferative diabetic retinopathy; PDR proliferative diabetic retinopathy; CSME clinically significant macular edema; DME diabetic macular edema; dbh dot blot hemorrhages; CWS cotton wool spot; POAG primary open angle glaucoma; C/D cup-to-disc ratio; HVF humphrey visual field; GVF goldmann visual field; OCT optical coherence tomography; IOP intraocular pressure; BRVO Branch retinal vein occlusion; CRVO central retinal vein occlusion; CRAO central retinal artery occlusion; BRAO branch retinal artery occlusion; RT retinal tear; SB scleral buckle; PPV pars plana vitrectomy; VH  Vitreous hemorrhage; PRP panretinal laser photocoagulation;  IVK intravitreal kenalog; VMT vitreomacular traction; MH Macular hole;  NVD neovascularization of the disc; NVE neovascularization elsewhere; AREDS age related eye disease study; ARMD age related macular degeneration; POAG primary open angle glaucoma; EBMD epithelial/anterior basement membrane dystrophy; ACIOL anterior chamber intraocular lens; IOL intraocular lens; PCIOL posterior chamber intraocular lens; Phaco/IOL phacoemulsification with intraocular lens placement; [place] photorefractive keratectomy; LASIK laser assisted in situ keratomileusis; HTN hypertension; DM diabetes mellitus; COPD chronic obstructive pulmonary disease

## 2018-10-21 ENCOUNTER — Encounter (INDEPENDENT_AMBULATORY_CARE_PROVIDER_SITE_OTHER): Payer: Medicare Other | Admitting: Ophthalmology

## 2018-10-23 NOTE — Progress Notes (Signed)
[facility] Note  10/24/2018     CHIEF COMPLAINT Patient presents for Retina Follow Up   HISTORY OF PRESENT ILLNESS: Louis Peters is a 53 y.o. male who presents to the clinic today for:   HPI    Retina Follow Up    Patient presents with  Diabetic Retinopathy.  In both eyes.  This started months ago.  Severity is moderate.  Duration of 10 days.  Since onset it is stable.  I, the attending physician,  performed the HPI with the patient and updated documentation appropriately.          Comments    53 y/o male pt here for 10 day f/u for PDR OU.  S/p PRP fill-in OS 10.01.19 and 01.13.20.  Chronic TRD OD.  No change in [place] OU.  Denies pain, flashes, floaters.  No gtts.       Last edited by [name], MD on 10/27/2018  2:02 AM. (History)    pt states his floaters are still there, he states he is sleeping on his back with pillows under him bc he has 2 broken ribs from falling down the stairs at his brother in laws house,     Referring physician: No referring provider defined for this encounter.  HISTORICAL INFORMATION:   Selected notes from the MEDICAL RECORD NUMBER Referred by Dr. [name] for concern of retinal traction LEE: 09.25.19 (K. Hallahan) [BCVA: OD: CF_0  OS: 20/40] Ocular Hx-vitreous hemorrhage OS, traction detachment OD, PDR OU Previous eye docs: [name] Last visit w/ [name] was in 2016 -- was supposed to schedule surgery OS (PPV, MP, EL) but pt never followed up PMH-DM (last A1C: 11.1, takes humalog, lantus), HTN     CURRENT MEDICATIONS: No current outpatient medications on file. (Ophthalmic Drugs)   No current facility-administered medications for this visit.  (Ophthalmic Drugs)   Current Outpatient Medications (Other)  Medication Sig  . albuterol (PROVENTIL HFA;VENTOLIN HFA) 108 (90 Base) MCG/ACT inhaler Inhale 2 puffs into the lungs every 6 (six) hours as needed for wheezing or shortness of breath.  Marland Kitchen  amLODipine (NORVASC) 10 MG tablet Take 10 mg by mouth daily.   Marland Kitchen ammonium lactate (LAC-HYDRIN) 12 % lotion Apply topically.  Marland Kitchen atorvastatin (LIPITOR) 20 MG tablet take 1 tablet by mouth once daily  . Blood Glucose Monitoring Suppl (ONETOUCH VERIO FLEX SYSTEM) w/Device KIT by Does not apply route.  Marland Kitchen glucagon 1 MG injection Inject into the skin.  Marland Kitchen glucose blood (ONETOUCH VERIO) test strip Use to check blood sugar 8 time(s) daily.  Marland Kitchen HUMALOG KWIKPEN 100 UNIT/ML KiwkPen Inject 5-6 Units into the skin 3 (three) times daily. Take 5 units at breakfast and lunch and Take 6 units at dinner.  Marland Kitchen ibuprofen (ADVIL,MOTRIN) 800 MG tablet Take 800 mg by mouth every 8 (eight) hours as needed for moderate pain.  Marland Kitchen insulin degludec (TRESIBA FLEXTOUCH) 100 UNIT/ML SOPN FlexTouch Pen INJECT 24 UNITS [place] HS DISCONTINUE LANTUS SOLOSTAR  . Insulin Glargine (LANTUS SOLOSTAR) 100 UNIT/ML Solostar Pen Inject 50 Units into the skin every morning. And pen needles 1/day  . Insulin Glargine (LANTUS) 100 UNIT/ML Solostar Pen Inject 40 Units into the skin daily at 10 pm.   . Insulin Pen Needle (FIFTY50 PEN NEEDLES) 32G X 4 MM MISC Use with lantus and humalog 4 imes per day  . losartan (COZAAR) 100 MG tablet Take by mouth.  . montelukast (SINGULAIR) 10 MG tablet Take 10 mg by  mouth at bedtime.  . ONE TOUCH LANCETS MISC Use to check blood sugar 8 time(s) daily  . silver sulfADIAZINE (SILVADENE) 1 % cream Apply topically.  Marland Kitchen losartan-hydrochlorothiazide (HYZAAR) 100-25 MG tablet Take 1 tablet by mouth daily.    No current facility-administered medications for this visit.  (Other)      REVIEW OF SYSTEMS: ROS    Positive for: Endocrine, Cardiovascular, Eyes   Negative for: Constitutional, Gastrointestinal, Neurological, Skin, Genitourinary, Musculoskeletal, HENT, Respiratory, Psychiatric, Allergic/Imm, Heme/Lymph   Last edited by Matthew Folks, COA on 10/24/2018  8:17 AM. (History)       ALLERGIES Allergies  Allergen  Reactions  . Fexofenadine Rash    unknown unknown unknown  . Latex Rash    PAST MEDICAL HISTORY Past Medical History:  Diagnosis Date  . Diabetes mellitus without complication ([place])   . Diabetic retinopathy ([place])    PDR OU  . Hypertension   . Retinal detachment    TRD OD   Past Surgical History:  Procedure Laterality Date  . CATARACT EXTRACTION    . EYE SURGERY    . RETINAL DETACHMENT SURGERY      FAMILY HISTORY Family History  Problem Relation Age of Onset  . Diabetes Maternal Grandmother   . Diabetes Sister     SOCIAL HISTORY Social History   Tobacco Use  . Smoking status: Never Smoker  . Smokeless tobacco: Never Used  Substance Use Topics  . Alcohol use: Yes    Comment: occ  . Drug use: No         OPHTHALMIC EXAM:  Base Eye Exam    Visual Acuity (Snellen - Linear)      Right Left   Dist cc CF 2' 20/25 -2   Dist ph cc 20/400 NI   Correction:  Glasses       Tonometry (Tonopen, 8:21 AM)      Right Left   Pressure 14 11       Pupils      Dark Light Shape React APD   Right 4 3 Round Brisk None   Left 4 3 Round Brisk None       Visual Fields (Counting fingers)      Left Right    Full    Restrictions  Partial outer superior temporal deficiency       Extraocular Movement      Right Left    Full, Ortho Full, Ortho       Neuro/Psych    Oriented x3:  Yes   Mood/Affect:  Normal       Dilation    Both eyes:  1.0% Mydriacyl, 2.5% Phenylephrine @ 8:21 AM        Slit Lamp and Fundus Exam    Slit Lamp Exam      Right Left   Lids/Lashes Dermatochalasis - upper lid, Meibomian gland dysfunction Dermatochalasis - upper lid, Meibomian gland dysfunction   Conjunctiva/Sclera Melanosis Melanosis   Cornea Arcus, 1+ Punctate epithelial erosions, mild endopigment Arcus, 1+ Punctate epithelial erosions   Anterior Chamber Deep and quiet Deep and quiet   Iris Round and dilated, No NVI Round and dilated, No NVI   Lens 2+ Nuclear sclerosis, 3+  Cortical cataract 2+ Nuclear sclerosis, 3+ Cortical cataract   Vitreous Vitreous syneresis Vitreous syneresis, superior fibrosis with mild traction along ST arcades, vitreous condensations, mild diffuse VH, new pre-retinal hemorrhages along arcades       Fundus Exam      Right Left  Disc 1-2+ Pallor, fibrosis eminating to ST arcade sharp rim, mild pallor   C/D Ratio  0.4   Macula TRD with vertical fibrotic band extending from ST to IT arcades, tractional fibrosis Good foveal reflex, +edema superior macula, Retinal pigment epithelial mottling   Vessels Severe Vascular attenuation, sclerotic arterioles, +fibrotic NV along arcades - inactive Vascular attenuation, early arteriolar sclerosis, fibrosis along temporal arcades, fibrotic NVE along distal IT arcade   Periphery Attached peripherally with 360 PRP laser with room for fill in peripherally  Attached, tractional retinoschisis along superior arcades; 360 PRP with room for posterior fill in superiorly towards schisis          IMAGING AND PROCEDURES  Imaging and Procedures for _0 @  OCT, Retina - OU - Both Eyes       Right Eye Quality was good. Central Foveal Thickness: (Unable to obtain). Progression has been stable. 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   Left Eye Quality was good. Central Foveal Thickness: 235. Progression has worsened. Findings include abnormal foveal contour, no SRF, intraretinal fluid, vitreous traction (Interval increase in vitreous opacities).   Notes *Images captured and stored on drive  Diagnosis / Impression:  OD: chronic TRD, +SRF OS: non-central VMT along superotemporal arcades with tractional retinoschisis; interval increase in vitreous opacities  Clinical management:  See below  Abbreviations: NFP - Normal foveal profile. CME -  cystoid macular edema. PED - pigment epithelial detachment. IRF - intraretinal fluid. SRF - subretinal fluid. EZ - ellipsoid zone. ERM - epiretinal membrane. ORA - outer retinal atrophy. ORT - outer retinal tubulation. SRHM - subretinal hyper-reflective material                  ASSESSMENT/PLAN:    ICD-10-CM   1. 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 Retinal edema H35.81 OCT, Retina - OU - Both Eyes  3. Right eye affected by proliferative diabetic retinopathy with traction retinal detachment involving macula, associated with type 2 diabetes mellitus ([place]) U44.0347   4. Essential hypertension I10   5. Hypertensive retinopathy of both eyes H35.033   6. 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 Proliferative diabetic retinopathy OU OD -- chronic, macula-involving TRD -- 20/400 vision -- severe retinal ischemia OS -- vitreous traction with macular edema / retinoschisis of superior macula -- BCVA 20/30-2 - former pt of Dr. Zadie Rhine in 2016 -- records reviewed, at that time, OD was already detached with VA 20/400, OS was to undergo PPV/MP/EL, but pt never had the surgery or followed up with [name] - FA (01.13.20) shows active NVE OS, OD with minimal leakage  - OCT shows chronic mac off TRD OD, and OS with tractional retinoschisis / macular edema of superior macula -- no interval change today - VA 20/25 OS - S/P PRP fill-in OS (10.01.19), fill-in (01.13.20) - new onset vitreous hemorrhage following last week's laser, appears to be clearing, concern for progression of vitreous traction - discussed possible need for surgery in the near future - F/U 1 week, OCT, DFE - OS may eventually need PPV / MP to relieve tractional retinoschisis, but with VA 20/25, will monitor for now  3. Chronic TRD OD-  - fovea involving chronic TRD OD -- periphery attached by laser PRP - severe  ischemia / arteriolar  sclerosis OD -- low vision potential - review of records for [name] indicate mac off TRD has been present since early 2016 -- now 3+ years - discussed findings and poor prognosis - do not recommend surgical intervention OD -- high risk, low benefit - pt not interested in surgical intervention at this time - monitor  4,5. Hypertensive retinopathy OU - discussed importance of tight BP control - monitor  5. Combined form age related cataracts OU- - The symptoms of cataract, surgical options, and treatments and risks were discussed with patient. - discussed diagnosis and progression - not yet visually significant - monitor   Ophthalmic Meds Ordered this visit:  No orders of the defined types were placed in this encounter.      Return in about 1 week (around 10/31/2018) for f/u VH OS, DFE, OCT.  There are no Patient Instructions on file for this visit.   Explained the diagnoses, plan, and follow up with the patient and they expressed understanding.  Patient expressed understanding of the importance of proper follow up care.   This document serves as a record of services personally performed by Gardiner Sleeper, MD, PhD. It was created on their behalf by Ernest Mallick, OA, an ophthalmic assistant. The creation of this record is the provider's dictation and/or activities during the visit.    Electronically signed by: Ernest Mallick, OA  01.22.2020 2:12 AM     Gardiner Sleeper, M.D., Ph.D. Diseases & Surgery of the Retina and Vitreous [facility]  I have reviewed the above documentation for accuracy and completeness, and I agree with the above. Gardiner Sleeper, M.D., Ph.D. 10/27/18 2:13 AM    Abbreviations: M myopia (nearsighted); A astigmatism; H hyperopia (farsighted); P presbyopia; Mrx spectacle prescription;  CTL contact lenses; OD right eye; OS left eye; OU both eyes  XT exotropia; ET esotropia; PEK punctate epithelial keratitis; PEE  punctate epithelial erosions; DES dry eye syndrome; MGD meibomian gland dysfunction; ATs artificial tears; PFAT's preservative free artificial tears; [place] nuclear sclerotic cataract; PSC posterior subcapsular cataract; ERM epi-retinal membrane; PVD posterior vitreous detachment; RD retinal detachment; DM diabetes mellitus; DR diabetic retinopathy; NPDR non-proliferative diabetic retinopathy; PDR proliferative diabetic retinopathy; CSME clinically significant macular edema; DME diabetic macular edema; dbh dot blot hemorrhages; CWS cotton wool spot; POAG primary open angle glaucoma; C/D cup-to-disc ratio; HVF humphrey visual field; GVF goldmann visual field; OCT optical coherence tomography; IOP intraocular pressure; BRVO Branch retinal vein occlusion; CRVO central retinal vein occlusion; CRAO central retinal artery occlusion; BRAO branch retinal artery occlusion; RT retinal tear; SB scleral buckle; PPV pars plana vitrectomy; VH Vitreous hemorrhage; PRP panretinal laser photocoagulation; IVK intravitreal kenalog; VMT vitreomacular traction; MH Macular hole;  NVD neovascularization of the disc; NVE neovascularization elsewhere; AREDS age related eye disease study; ARMD age related macular degeneration; POAG primary open angle glaucoma; EBMD epithelial/anterior basement membrane dystrophy; ACIOL anterior chamber intraocular lens; IOL intraocular lens; PCIOL posterior chamber intraocular lens; Phaco/IOL phacoemulsification with intraocular lens placement; [place] photorefractive keratectomy; LASIK laser assisted in situ keratomileusis; HTN hypertension; DM diabetes mellitus; COPD chronic obstructive pulmonary disease

## 2018-10-24 ENCOUNTER — Encounter (INDEPENDENT_AMBULATORY_CARE_PROVIDER_SITE_OTHER): Payer: Self-pay | Admitting: Ophthalmology

## 2018-10-24 ENCOUNTER — Ambulatory Visit (INDEPENDENT_AMBULATORY_CARE_PROVIDER_SITE_OTHER): Payer: Medicare Other | Admitting: Ophthalmology

## 2018-10-24 DIAGNOSIS — H25813 Combined forms of age-related cataract, bilateral: Secondary | ICD-10-CM

## 2018-10-24 DIAGNOSIS — H3581 Retinal edema: Secondary | ICD-10-CM | POA: Diagnosis not present

## 2018-10-24 DIAGNOSIS — I10 Essential (primary) hypertension: Secondary | ICD-10-CM

## 2018-10-24 DIAGNOSIS — E113512 Type 2 diabetes mellitus with proliferative diabetic retinopathy with macular edema, left eye: Secondary | ICD-10-CM

## 2018-10-24 DIAGNOSIS — H35033 Hypertensive retinopathy, bilateral: Secondary | ICD-10-CM

## 2018-10-24 DIAGNOSIS — E113521 Type 2 diabetes mellitus with proliferative diabetic retinopathy with traction retinal detachment involving the macula, right eye: Secondary | ICD-10-CM

## 2018-10-27 ENCOUNTER — Encounter (INDEPENDENT_AMBULATORY_CARE_PROVIDER_SITE_OTHER): Payer: Self-pay | Admitting: Ophthalmology

## 2018-10-29 NOTE — Progress Notes (Signed)
[facility] Note  10/31/2018     CHIEF COMPLAINT Patient presents for Retina Follow Up   HISTORY OF PRESENT ILLNESS: Louis Peters is a 53 y.o. male who presents to the clinic today for:   HPI    Retina Follow Up    Patient presents with  Diabetic Retinopathy.  In both eyes.  Severity is moderate.  Duration of 1 week.  Since onset it is stable.  I, the attending physician,  performed the HPI with the patient and updated documentation appropriately.          Comments    PDR ou follow up times 1 week. Patient states no vision changes noticed. Blood Sugar today 45.        Last edited by [name], MD on 10/31/2018  8:24 AM. (History)    pt states he feels like his vision is clearing up a little bit, he states around 6:00-7:00 at night he doesn't see any floaters, he states he is still sleeping upright,   Referring physician: No referring provider defined for this encounter.  HISTORICAL INFORMATION:   Selected notes from the MEDICAL RECORD NUMBER Referred by Dr. [name] for concern of retinal traction LEE: 09.25.19 (K. Hallahan) [BCVA: OD: CF_0  OS: 20/40] Ocular Hx-vitreous hemorrhage OS, traction detachment OD, PDR OU Previous eye docs: [name] Last visit w/ [name] was in 2016 -- was supposed to schedule surgery OS (PPV, MP, EL) but pt never followed up PMH-DM (last A1C: 11.1, takes humalog, lantus), HTN     CURRENT MEDICATIONS: No current outpatient medications on file. (Ophthalmic Drugs)   No current facility-administered medications for this visit.  (Ophthalmic Drugs)   Current Outpatient Medications (Other)  Medication Sig  . albuterol (PROVENTIL HFA;VENTOLIN HFA) 108 (90 Base) MCG/ACT inhaler Inhale 2 puffs into the lungs every 6 (six) hours as needed for wheezing or shortness of breath.  Marland Kitchen amLODipine (NORVASC) 10 MG tablet Take 10 mg by mouth daily.   Marland Kitchen ammonium lactate (LAC-HYDRIN) 12 % lotion Apply  topically.  Marland Kitchen atorvastatin (LIPITOR) 20 MG tablet take 1 tablet by mouth once daily  . Blood Glucose Monitoring Suppl (ONETOUCH VERIO FLEX SYSTEM) w/Device KIT by Does not apply route.  Marland Kitchen glucagon 1 MG injection Inject into the skin.  Marland Kitchen glucose blood (ONETOUCH VERIO) test strip Use to check blood sugar 8 time(s) daily.  Marland Kitchen HUMALOG KWIKPEN 100 UNIT/ML KiwkPen Inject 5-6 Units into the skin 3 (three) times daily. Take 5 units at breakfast and lunch and Take 6 units at dinner.  Marland Kitchen ibuprofen (ADVIL,MOTRIN) 800 MG tablet Take 800 mg by mouth every 8 (eight) hours as needed for moderate pain.  Marland Kitchen insulin degludec (TRESIBA FLEXTOUCH) 100 UNIT/ML SOPN FlexTouch Pen INJECT 24 UNITS [place] HS DISCONTINUE LANTUS SOLOSTAR  . Insulin Glargine (LANTUS SOLOSTAR) 100 UNIT/ML Solostar Pen Inject 50 Units into the skin every morning. And pen needles 1/day  . Insulin Glargine (LANTUS) 100 UNIT/ML Solostar Pen Inject 40 Units into the skin daily at 10 pm.   . Insulin Pen Needle (FIFTY50 PEN NEEDLES) 32G X 4 MM MISC Use with lantus and humalog 4 imes per day  . losartan (COZAAR) 100 MG tablet Take by mouth.  . montelukast (SINGULAIR) 10 MG tablet Take 10 mg by mouth at bedtime.  . ONE TOUCH LANCETS MISC Use to check blood sugar 8 time(s) daily  . silver sulfADIAZINE (SILVADENE) 1 % cream Apply topically.  Marland Kitchen losartan-hydrochlorothiazide (HYZAAR) 100-25  MG tablet Take 1 tablet by mouth daily.    No current facility-administered medications for this visit.  (Other)      REVIEW OF SYSTEMS: ROS    Positive for: Endocrine, Cardiovascular, Eyes   Negative for: Constitutional, Gastrointestinal, Neurological, Skin, Genitourinary, Musculoskeletal, HENT, Respiratory, Psychiatric, Allergic/Imm, Heme/Lymph   Last edited by Elmore Guise on 10/31/2018  8:07 AM. (History)       ALLERGIES Allergies  Allergen Reactions  . Fexofenadine Rash    unknown unknown unknown  . Latex Rash    PAST MEDICAL HISTORY Past Medical  History:  Diagnosis Date  . Diabetes mellitus without complication ([place])   . Diabetic retinopathy ([place])    PDR OU  . Hypertension   . Retinal detachment    TRD OD   Past Surgical History:  Procedure Laterality Date  . CATARACT EXTRACTION    . EYE SURGERY    . RETINAL DETACHMENT SURGERY      FAMILY HISTORY Family History  Problem Relation Age of Onset  . Diabetes Maternal Grandmother   . Diabetes Sister     SOCIAL HISTORY Social History   Tobacco Use  . Smoking status: Never Smoker  . Smokeless tobacco: Never Used  Substance Use Topics  . Alcohol use: Yes    Comment: occ  . Drug use: No         OPHTHALMIC EXAM:  Base Eye Exam    Visual Acuity (Snellen - Linear)      Right Left   Dist cc 20/400 20/30   Dist ph cc 20/200-2 20/25-3       Tonometry (Tonopen, 8:09 AM)      Right Left   Pressure 19 17       Pupils      Dark Light Shape React APD   Right 4 3 Round Brisk None   Left 4 3 Round Brisk None       Visual Fields (Counting fingers)      Left Right    Full    Restrictions  Partial outer superior temporal deficiency       Extraocular Movement      Right Left    Full, Ortho Full, Ortho       Neuro/Psych    Oriented x3:  Yes   Mood/Affect:  Normal       Dilation    Both eyes:  1.0% Mydriacyl, 2.5% Phenylephrine @ 8:09 AM        Slit Lamp and Fundus Exam    Slit Lamp Exam      Right Left   Lids/Lashes Dermatochalasis - upper lid, Meibomian gland dysfunction Dermatochalasis - upper lid, Meibomian gland dysfunction   Conjunctiva/Sclera Melanosis Melanosis   Cornea Arcus, 1+ Punctate epithelial erosions, mild endopigment Arcus, 1+ Punctate epithelial erosions   Anterior Chamber Deep and quiet Deep and quiet   Iris Round and dilated, No NVI Round and dilated, No NVI   Lens 2+ Nuclear sclerosis, 3+ Cortical cataract 2+ Nuclear sclerosis, 3+ Cortical cataract   Vitreous Vitreous syneresis mildly hazy view, Vitreous syneresis, superior  fibrosis with mild traction along ST arcades, vitreous condensations, mild diffuse VH and pre-retinal hemorrhages along arcades -- improving, red blot clots settling inferiorly       Fundus Exam      Right Left   Disc 1-2+ Pallor, fibrosis eminating to ST arcade sharp rim, mild pallor   C/D Ratio  0.4   Macula TRD with vertical fibrotic band extending from ST  to IT arcades, tractional fibrosis Good foveal reflex, +edema superior macula, Retinal pigment epithelial mottling   Vessels Severe Vascular attenuation, sclerotic arterioles, +fibrotic NV along arcades - inactive Vascular attenuation, early arteriolar sclerosis, fibrosis along temporal arcades, fibrotic NVE along distal IT arcade   Periphery Attached peripherally with 360 PRP laser with room for fill in peripherally  Attached, tractional retinoschisis along superior arcades; 360 PRP with room for posterior fill in superiorly towards schisis        Refraction    Wearing Rx      Sphere Cylinder Axis Add   Right -0.50 +0.75 045 +1.75   Left -3.00 +1.25 155 +1.75          IMAGING AND PROCEDURES  Imaging and Procedures for _0 @  OCT, Retina - OU - Both Eyes       Right Eye Quality was good. Central Foveal Thickness: 707 (Unable to obtain). Progression has been stable. 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   Left Eye Quality was good. Central Foveal Thickness: 236. Progression has improved. Findings include abnormal foveal contour, no SRF, intraretinal fluid, vitreous traction (Interval improvement in vitreous opacities).   Notes *Images captured and stored on drive  Diagnosis / Impression:  OD: chronic TRD, +SRF OS: non-central VMT along superotemporal arcades with tractional retinoschisis; interval improvement in vitreous  opacities  Clinical management:  See below  Abbreviations: NFP - Normal foveal profile. CME - cystoid macular edema. PED - pigment epithelial detachment. IRF - intraretinal fluid. SRF - subretinal fluid. EZ - ellipsoid zone. ERM - epiretinal membrane. ORA - outer retinal atrophy. ORT - outer retinal tubulation. SRHM - subretinal hyper-reflective material                  ASSESSMENT/PLAN:    ICD-10-CM   1. 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 Retinal edema H35.81 OCT, Retina - OU - Both Eyes  3. 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 Essential hypertension I10   5. Hypertensive retinopathy of both eyes H35.033   6. Combined forms of age-related cataract of both eyes H25.813     1,2. Proliferative diabetic retinopathy OU OD -- chronic, macula-involving TRD -- 20/400 vision -- severe retinal ischemia OS -- vitreous traction with macular edema / retinoschisis of superior macula -- BCVA 20/30-2 - former pt of Dr. Zadie Rhine in 2016 -- records reviewed, at that time, OD was already detached with VA 20/400, OS was to undergo PPV/MP/EL, but pt never had the surgery or followed up with [name] - FA (01.13.20) shows active NVE OS, OD with minimal leakage  - OCT shows chronic mac off TRD OD, and OS with tractional retinoschisis / macular edema of superior macula -- no interval change today - VA 20/25 OS - S/P PRP fill-in OS (10.01.19), fill-in (01.13.20) - new onset vitreous hemorrhage following previous laser, appears to be clearing, concern for progression of vitreous traction - discussed possible need for surgery in the near future - F/U 1 week, OCT, DFE - OS may eventually need PPV / MP to relieve tractional retinoschisis, but with VA 20/25, will monitor for now  3.  Chronic TRD OD-  - fovea involving chronic TRD OD --  periphery attached by laser PRP - severe ischemia / arteriolar sclerosis OD -- low vision potential - review of records for [name] indicate mac off TRD has been present since early 2016 -- now 3+ years - discussed findings and poor prognosis - do not recommend surgical intervention OD -- high risk, low benefit - pt not interested in surgical intervention at this time - monitor  4,5. Hypertensive retinopathy OU - discussed importance of tight BP control - monitor  6. Combined form age related cataracts OU- - The symptoms of cataract, surgical options, and treatments and risks were discussed with patient. - discussed diagnosis and progression - not yet visually significant - monitor   Ophthalmic Meds Ordered this visit:  No orders of the defined types were placed in this encounter.      Return for f/u 7-14 days, VH OS, DFE, OCT.  There are no Patient Instructions on file for this visit.   Explained the diagnoses, plan, and follow up with the patient and they expressed understanding.  Patient expressed understanding of the importance of proper follow up care.   This document serves as a record of services personally performed by Gardiner Sleeper, MD, PhD. It was created on their behalf by Ernest Mallick, OA, an ophthalmic assistant. The creation of this record is the provider's dictation and/or activities during the visit.    Electronically signed by: Ernest Mallick, OA  01.28.2020 8:59 AM     Gardiner Sleeper, M.D., Ph.D. Diseases & Surgery of the Retina and Vitreous [facility]  I have reviewed the above documentation for accuracy and completeness, and I agree with the above. Gardiner Sleeper, M.D., Ph.D. 10/31/18 9:00 AM    Abbreviations: M myopia (nearsighted); A astigmatism; H hyperopia (farsighted); P presbyopia; Mrx spectacle prescription;  CTL contact lenses; OD right eye; OS left eye; OU both  eyes  XT exotropia; ET esotropia; PEK punctate epithelial keratitis; PEE punctate epithelial erosions; DES dry eye syndrome; MGD meibomian gland dysfunction; ATs artificial tears; PFAT's preservative free artificial tears; [place] nuclear sclerotic cataract; PSC posterior subcapsular cataract; ERM epi-retinal membrane; PVD posterior vitreous detachment; RD retinal detachment; DM diabetes mellitus; DR diabetic retinopathy; NPDR non-proliferative diabetic retinopathy; PDR proliferative diabetic retinopathy; CSME clinically significant macular edema; DME diabetic macular edema; dbh dot blot hemorrhages; CWS cotton wool spot; POAG primary open angle glaucoma; C/D cup-to-disc ratio; HVF humphrey visual field; GVF goldmann visual field; OCT optical coherence tomography; IOP intraocular pressure; BRVO Branch retinal vein occlusion; CRVO central retinal vein occlusion; CRAO central retinal artery occlusion; BRAO branch retinal artery occlusion; RT retinal tear; SB scleral buckle; PPV pars plana vitrectomy; VH Vitreous hemorrhage; PRP panretinal laser photocoagulation; IVK intravitreal kenalog; VMT vitreomacular traction; MH Macular hole;  NVD neovascularization of the disc; NVE neovascularization elsewhere; AREDS age related eye disease study; ARMD age related macular degeneration; POAG primary open angle glaucoma; EBMD epithelial/anterior basement membrane dystrophy; ACIOL anterior chamber intraocular lens; IOL intraocular lens; PCIOL posterior chamber intraocular lens; Phaco/IOL phacoemulsification with intraocular lens placement; [place] photorefractive keratectomy; LASIK laser assisted in situ keratomileusis; HTN hypertension; DM diabetes mellitus; COPD chronic obstructive pulmonary disease

## 2018-10-31 ENCOUNTER — Ambulatory Visit (INDEPENDENT_AMBULATORY_CARE_PROVIDER_SITE_OTHER): Payer: Medicare Other | Admitting: Ophthalmology

## 2018-10-31 ENCOUNTER — Encounter (INDEPENDENT_AMBULATORY_CARE_PROVIDER_SITE_OTHER): Payer: Self-pay | Admitting: Ophthalmology

## 2018-10-31 DIAGNOSIS — I10 Essential (primary) hypertension: Secondary | ICD-10-CM | POA: Diagnosis not present

## 2018-10-31 DIAGNOSIS — E113521 Type 2 diabetes mellitus with proliferative diabetic retinopathy with traction retinal detachment involving the macula, right eye: Secondary | ICD-10-CM

## 2018-10-31 DIAGNOSIS — E113512 Type 2 diabetes mellitus with proliferative diabetic retinopathy with macular edema, left eye: Secondary | ICD-10-CM | POA: Diagnosis not present

## 2018-10-31 DIAGNOSIS — H3581 Retinal edema: Secondary | ICD-10-CM | POA: Diagnosis not present

## 2018-10-31 DIAGNOSIS — H25813 Combined forms of age-related cataract, bilateral: Secondary | ICD-10-CM

## 2018-10-31 DIAGNOSIS — H35033 Hypertensive retinopathy, bilateral: Secondary | ICD-10-CM

## 2018-11-11 NOTE — Progress Notes (Signed)
[facility] Note  11/13/2018                        CHIEF COMPLAINT Patient presents for Retina Follow Up   HISTORY OF PRESENT ILLNESS: Louis Peters is a 53 y.o. male who presents to the clinic today for:   HPI    Retina Follow Up    Patient presents with  Diabetic Retinopathy.  In both eyes.  This started 1 week ago.  Severity is moderate.  Duration of 1 week.  Since onset it is stable.  I, the attending physician,  performed the HPI with the patient and updated documentation appropriately.          Comments    Patient here for 1 week follow for PDR OU. Patient states vision about the same. No eye pain.       Last edited by Louis Caffey, MD on 11/13/2018  8:48 AM. (History)    pt feels like the blood in his eye may be clearing a little bit, pts blood pressure is 200/130 this morning, pt states he is on 4 different pills for bp and did not take any of them this morning  Referring physician: No referring provider defined for this encounter.  HISTORICAL INFORMATION:   Selected notes from the MEDICAL RECORD NUMBER Referred by Dr. Aron Peters for concern of retinal traction LEE: 09.25.19 (Louis Peters) [BCVA: OD: CF_0  OS: 20/40] Ocular Hx-vitreous hemorrhage OS, traction detachment OD, PDR OU Previous eye docs: Louis Peters Last visit w/ Louis Peters was in 2016 -- was supposed to schedule surgery OS (PPV, MP, EL) but pt never followed up PMH-DM (last A1C: 11.1, takes humalog, lantus), HTN     CURRENT MEDICATIONS: No current outpatient medications on file. (Ophthalmic Drugs)   No current facility-administered medications for this visit.  (Ophthalmic Drugs)   Current Outpatient Medications (Other)  Medication Sig  . albuterol (PROVENTIL HFA;VENTOLIN HFA) 108 (90 Base) MCG/ACT inhaler Inhale 2 puffs into the lungs every 6 (six) hours as needed for wheezing or shortness of breath.  Marland Kitchen amLODipine (NORVASC) 10 MG tablet Take 10 mg by mouth  daily.   Marland Kitchen ammonium lactate (LAC-HYDRIN) 12 % lotion Apply topically.  Marland Kitchen atorvastatin (LIPITOR) 20 MG tablet take 1 tablet by mouth once daily  . Blood Glucose Monitoring Suppl (ONETOUCH VERIO FLEX SYSTEM) w/Device KIT by Does not apply route.  Marland Kitchen glucagon 1 MG injection Inject into the skin.  Marland Kitchen glucose blood (ONETOUCH VERIO) test strip Use to check blood sugar 8 time(s) daily.  Marland Kitchen HUMALOG KWIKPEN 100 UNIT/ML KiwkPen Inject 5-6 Units into the skin 3 (three) times daily. Take 5 units at breakfast and lunch and Take 6 units at dinner.  Marland Kitchen ibuprofen (ADVIL,MOTRIN) 800 MG tablet Take 800 mg by mouth every 8 (eight) hours as needed for moderate pain.  Marland Kitchen insulin degludec (TRESIBA FLEXTOUCH) 100 UNIT/ML SOPN FlexTouch Pen INJECT 24 UNITS [place] HS DISCONTINUE LANTUS SOLOSTAR  . Insulin Glargine (LANTUS SOLOSTAR) 100 UNIT/ML Solostar Pen Inject 50 Units into the skin every morning. And pen needles 1/day  . Insulin Glargine (LANTUS) 100 UNIT/ML Solostar Pen Inject 40 Units into the skin daily at 10 pm.   . Insulin Pen Needle (FIFTY50 PEN NEEDLES) 32G X 4 MM MISC Use with lantus and humalog 4 imes per day  . losartan (COZAAR) 100 MG tablet Take by mouth.  . losartan-hydrochlorothiazide (HYZAAR) 100-25 MG tablet Take 1 tablet  by mouth daily.   . montelukast (SINGULAIR) 10 MG tablet Take 10 mg by mouth at bedtime.  . ONE TOUCH LANCETS MISC Use to check blood sugar 8 time(s) daily  . silver sulfADIAZINE (SILVADENE) 1 % cream Apply topically.   No current facility-administered medications for this visit.  (Other)      REVIEW OF SYSTEMS: ROS    Positive for: Endocrine, Cardiovascular, Eyes   Negative for: Constitutional, Gastrointestinal, Neurological, Skin, Genitourinary, Musculoskeletal, HENT, Respiratory, Psychiatric, Allergic/Imm, Heme/Lymph   Last edited by Louis Peters on 11/13/2018  8:25 AM. (History)       ALLERGIES Allergies  Allergen Reactions  . Fexofenadine Rash     unknown unknown unknown  . Latex Rash    PAST MEDICAL HISTORY Past Medical History:  Diagnosis Date  . Diabetes mellitus without complication ([place])   . Diabetic retinopathy ([place])    PDR OU  . Hypertension   . Retinal detachment    TRD OD   Past Surgical History:  Procedure Laterality Date  . CATARACT EXTRACTION    . EYE SURGERY    . RETINAL DETACHMENT SURGERY      FAMILY HISTORY Family History  Problem Relation Age of Onset  . Diabetes Maternal Grandmother   . Diabetes Sister     SOCIAL HISTORY Social History   Tobacco Use  . Smoking status: Never Smoker  . Smokeless tobacco: Never Used  Substance Use Topics  . Alcohol use: Yes    Comment: occ  . Drug use: No         OPHTHALMIC EXAM:  Base Eye Exam    Visual Acuity (Snellen - Linear)      Right Left   Dist cc 20/400 +1 20/30 +2   Dist ph cc 20/200 -2 20/25 -1   Correction:  Glasses       Tonometry (Tonopen, 8:23 AM)      Right Left   Pressure 15 15       Pupils      Dark Light Shape React APD   Right 4 3 Round Brisk None   Left 4 3 Round Brisk None       Visual Fields (Counting fingers)      Left Right    Full    Restrictions  Partial outer superior temporal deficiency       Extraocular Movement      Right Left    Full, Ortho Full, Ortho       Neuro/Psych    Oriented x3:  Yes   Mood/Affect:  Normal       Dilation    Both eyes:  1.0% Mydriacyl, 2.5% Phenylephrine @ 8:23 AM        Slit Lamp and Fundus Exam    Slit Lamp Exam      Right Left   Lids/Lashes Dermatochalasis - upper lid, Meibomian gland dysfunction Dermatochalasis - upper lid, Meibomian gland dysfunction   Conjunctiva/Sclera Melanosis Melanosis   Cornea Arcus, 1+ Punctate epithelial erosions, mild endopigment Arcus, 1+ Punctate epithelial erosions   Anterior Chamber Deep and quiet Deep and quiet   Iris Round and dilated, No NVI Round and dilated, No NVI   Lens 2+ Nuclear sclerosis, 3+ Cortical cataract 2+ Nuclear  sclerosis, 3+ Cortical cataract   Vitreous Vitreous syneresis VH improved centrally, boat shaped heme below inferior arcade, mild blood clots inferiorly       Fundus Exam      Right Left   Disc 1-2+ Pallor, fibrosis  eminating to ST arcade sharp rim, mild pallor   C/D Ratio  0.4   Macula TRD with vertical fibrotic band extending from ST to IT arcades, tractional fibrosis Good foveal reflex, +edema superior macula, Retinal pigment epithelial mottling   Vessels Severe Vascular attenuation, sclerotic arterioles, +fibrotic NV along arcades - inactive Vascular attenuation, early arteriolar sclerosis, fibrosis along temporal arcades, fibrotic NVE along distal IT arcade -- regressing   Periphery Attached peripherally with 360 PRP laser with room for fill in peripherally  Attached, tractional retinoschisis along superior arcades; 360 PRP with room for posterior fill in superiorly towards schisis        Refraction    Wearing Rx      Sphere Cylinder Axis Add   Right -0.50 +0.75 045 +1.75   Left -3.00 +1.25 155 +1.75          IMAGING AND PROCEDURES  Imaging and Procedures for _0 @  OCT, Retina - OU - Both Eyes       Right Eye Quality was good. Central Foveal Thickness: 696 (Unable to obtain). Progression has been stable. 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   Left Eye Quality was good. Central Foveal Thickness: 239. Progression has improved. Findings include abnormal foveal contour, no SRF, intraretinal fluid, vitreous traction (Mild Interval improvement in vitreous opacities and tractional schisis).   Notes *Images captured and stored on drive  Diagnosis / Impression:  OD: chronic TRD, +SRF OS: non-central VMT along superotemporal arcades with tractional retinoschisis; mild interval  improvement in vitreous opacities and tractional schisis  Clinical management:  See below  Abbreviations: NFP - Normal foveal profile. CME - cystoid macular edema. PED - pigment epithelial detachment. IRF - intraretinal fluid. SRF - subretinal fluid. EZ - ellipsoid zone. ERM - epiretinal membrane. ORA - outer retinal atrophy. ORT - outer retinal tubulation. SRHM - subretinal hyper-reflective material                  ASSESSMENT/PLAN:    ICD-10-CM   1. 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 Retinal edema H35.81 OCT, Retina - OU - Both Eyes  3. Right eye affected by proliferative diabetic retinopathy with traction retinal detachment involving macula, associated with type 2 diabetes mellitus ([place]) C62.3762   4. Essential hypertension I10   5. Hypertensive retinopathy of both eyes H35.033   6. Combined forms of age-related cataract of both eyes H25.813     1,2. Proliferative diabetic retinopathy OU OD -- chronic, macula-involving TRD -- 20/400 vision -- severe retinal ischemia OS -- vitreous traction with macular edema / retinoschisis of superior macula -- BCVA 20/30-2 - former pt of Dr. Zadie Rhine in 2016 -- records reviewed, at that time, OD was already detached with VA 20/400, OS was to undergo PPV/MP/EL, but pt never had the surgery or followed up with Louis Peters - FA (01.13.20) shows active NVE OS, OD with minimal leakage  - OCT shows chronic mac off TRD OD, and OS with tractional retinoschisis / macular edema of superior macula -- mild interval improvement today - VA 20/25 OS - S/P PRP fill-in OS (10.01.19), fill-in (01.13.20) - new onset vitreous hemorrhage following previous laser, continues to clear - discussed possible need for surgery in the future - F/U 2-3 week, OCT, DFE - OS may eventually need PPV /  MP to relieve tractional retinoschisis, but with VA 20/25, will monitor for now  3.  Chronic TRD OD-  - fovea involving chronic TRD OD -- periphery attached by laser PRP - severe ischemia / arteriolar sclerosis OD -- low vision potential - review of records for Louis Peters indicate mac off TRD has been present since early 2016 -- now 3+ years - discussed findings and poor prognosis - do not recommend surgical intervention OD -- high risk, low benefit - pt not interested in surgical intervention at this time - monitor  4,5. Hypertensive retinopathy OU - discussed importance of tight BP control - monitor  6. Combined form age related cataracts OU- - The symptoms of cataract, surgical options, and treatments and risks were discussed with patient. - discussed diagnosis and progression - not yet visually significant - monitor   Ophthalmic Meds Ordered this visit:  No orders of the defined types were placed in this encounter.      Return for f/u 2-3 weeks, VH OS, DFE, OCT.  There are no Patient Instructions on file for this visit.   Explained the diagnoses, plan, and follow up with the patient and they expressed understanding.  Patient expressed understanding of the importance of proper follow up care.   This document serves as a record of services personally performed by Gardiner Sleeper, MD, PhD. It was created on their behalf by Ernest Mallick, OA, an ophthalmic assistant. The creation of this record is the provider's dictation and/or activities during the visit.    Electronically signed by: Ernest Mallick, OA  02.10.2020 3:21 PM     Gardiner Sleeper, M.D., Ph.D. Diseases & Surgery of the Retina and Vitreous [facility]  I have reviewed the above documentation for accuracy and completeness, and I agree with the above. Gardiner Sleeper, M.D., Ph.D. 11/16/18 3:25 PM     Abbreviations: M myopia (nearsighted); A astigmatism; H hyperopia (farsighted); P presbyopia; Mrx spectacle prescription;  CTL contact lenses; OD right eye; OS left eye; OU both  eyes  XT exotropia; ET esotropia; PEK punctate epithelial keratitis; PEE punctate epithelial erosions; DES dry eye syndrome; MGD meibomian gland dysfunction; ATs artificial tears; PFAT's preservative free artificial tears; [place] nuclear sclerotic cataract; PSC posterior subcapsular cataract; ERM epi-retinal membrane; PVD posterior vitreous detachment; RD retinal detachment; DM diabetes mellitus; DR diabetic retinopathy; NPDR non-proliferative diabetic retinopathy; PDR proliferative diabetic retinopathy; CSME clinically significant macular edema; DME diabetic macular edema; dbh dot blot hemorrhages; CWS cotton wool spot; POAG primary open angle glaucoma; C/D cup-to-disc ratio; HVF humphrey visual field; GVF goldmann visual field; OCT optical coherence tomography; IOP intraocular pressure; BRVO Branch retinal vein occlusion; CRVO central retinal vein occlusion; CRAO central retinal artery occlusion; BRAO branch retinal artery occlusion; RT retinal tear; SB scleral buckle; PPV pars plana vitrectomy; VH Vitreous hemorrhage; PRP panretinal laser photocoagulation; IVK intravitreal kenalog; VMT vitreomacular traction; MH Macular hole;  NVD neovascularization of the disc; NVE neovascularization elsewhere; AREDS age related eye disease study; ARMD age related macular degeneration; POAG primary open angle glaucoma; EBMD epithelial/anterior basement membrane dystrophy; ACIOL anterior chamber intraocular lens; IOL intraocular lens; PCIOL posterior chamber intraocular lens; Phaco/IOL phacoemulsification with intraocular lens placement; [place] photorefractive keratectomy; LASIK laser assisted in situ keratomileusis; HTN hypertension; DM diabetes mellitus; COPD chronic obstructive pulmonary disease

## 2018-11-13 ENCOUNTER — Encounter (INDEPENDENT_AMBULATORY_CARE_PROVIDER_SITE_OTHER): Payer: Self-pay | Admitting: Ophthalmology

## 2018-11-13 ENCOUNTER — Ambulatory Visit (INDEPENDENT_AMBULATORY_CARE_PROVIDER_SITE_OTHER): Payer: Medicare Other | Admitting: Ophthalmology

## 2018-11-13 VITALS — BP 210/92 | HR 86

## 2018-11-13 DIAGNOSIS — E113512 Type 2 diabetes mellitus with proliferative diabetic retinopathy with macular edema, left eye: Secondary | ICD-10-CM

## 2018-11-13 DIAGNOSIS — I10 Essential (primary) hypertension: Secondary | ICD-10-CM

## 2018-11-13 DIAGNOSIS — H3581 Retinal edema: Secondary | ICD-10-CM

## 2018-11-13 DIAGNOSIS — E113521 Type 2 diabetes mellitus with proliferative diabetic retinopathy with traction retinal detachment involving the macula, right eye: Secondary | ICD-10-CM | POA: Diagnosis not present

## 2018-11-13 DIAGNOSIS — H35033 Hypertensive retinopathy, bilateral: Secondary | ICD-10-CM

## 2018-11-13 DIAGNOSIS — H25813 Combined forms of age-related cataract, bilateral: Secondary | ICD-10-CM

## 2018-11-14 ENCOUNTER — Encounter (INDEPENDENT_AMBULATORY_CARE_PROVIDER_SITE_OTHER): Payer: Medicare Other | Admitting: Ophthalmology

## 2018-11-28 ENCOUNTER — Encounter (INDEPENDENT_AMBULATORY_CARE_PROVIDER_SITE_OTHER): Payer: Medicare Other | Admitting: Ophthalmology

## 2019-03-10 ENCOUNTER — Encounter (HOSPITAL_COMMUNITY): Payer: Self-pay | Admitting: Emergency Medicine

## 2019-03-10 ENCOUNTER — Observation Stay (HOSPITAL_COMMUNITY)
Admission: EM | Admit: 2019-03-10 | Discharge: 2019-03-11 | Disposition: A | Discharge status: Home / Self Care | Payer: Medicare Other | Attending: Family Medicine | Admitting: Family Medicine

## 2019-03-10 ENCOUNTER — Other Ambulatory Visit: Payer: Self-pay

## 2019-03-10 DIAGNOSIS — N179 Acute kidney failure, unspecified: Principal | ICD-10-CM | POA: Diagnosis present

## 2019-03-10 DIAGNOSIS — E785 Hyperlipidemia, unspecified: Secondary | ICD-10-CM | POA: Insufficient documentation

## 2019-03-10 DIAGNOSIS — E109 Type 1 diabetes mellitus without complications: Secondary | ICD-10-CM | POA: Diagnosis present

## 2019-03-10 DIAGNOSIS — N189 Chronic kidney disease, unspecified: Secondary | ICD-10-CM

## 2019-03-10 DIAGNOSIS — N183 Chronic kidney disease, stage 3 unspecified: Secondary | ICD-10-CM

## 2019-03-10 DIAGNOSIS — E10319 Type 1 diabetes mellitus with unspecified diabetic retinopathy without macular edema: Secondary | ICD-10-CM | POA: Diagnosis not present

## 2019-03-10 DIAGNOSIS — Z23 Encounter for immunization: Secondary | ICD-10-CM | POA: Insufficient documentation

## 2019-03-10 DIAGNOSIS — I129 Hypertensive chronic kidney disease with stage 1 through stage 4 chronic kidney disease, or unspecified chronic kidney disease: Secondary | ICD-10-CM | POA: Diagnosis not present

## 2019-03-10 DIAGNOSIS — T678XXA Other effects of heat and light, initial encounter: Secondary | ICD-10-CM

## 2019-03-10 DIAGNOSIS — Z7951 Long term (current) use of inhaled steroids: Secondary | ICD-10-CM | POA: Insufficient documentation

## 2019-03-10 DIAGNOSIS — E7800 Pure hypercholesterolemia, unspecified: Secondary | ICD-10-CM | POA: Diagnosis not present

## 2019-03-10 DIAGNOSIS — N185 Chronic kidney disease, stage 5: Secondary | ICD-10-CM

## 2019-03-10 DIAGNOSIS — E1022 Type 1 diabetes mellitus with diabetic chronic kidney disease: Secondary | ICD-10-CM | POA: Diagnosis not present

## 2019-03-10 DIAGNOSIS — Z794 Long term (current) use of insulin: Secondary | ICD-10-CM | POA: Insufficient documentation

## 2019-03-10 DIAGNOSIS — Z1159 Encounter for screening for other viral diseases: Secondary | ICD-10-CM | POA: Insufficient documentation

## 2019-03-10 DIAGNOSIS — I120 Hypertensive chronic kidney disease with stage 5 chronic kidney disease or end stage renal disease: Secondary | ICD-10-CM | POA: Diagnosis present

## 2019-03-10 DIAGNOSIS — Z79899 Other long term (current) drug therapy: Secondary | ICD-10-CM | POA: Insufficient documentation

## 2019-03-10 HISTORY — DX: Pure hypercholesterolemia, unspecified: E78.00

## 2019-03-10 HISTORY — DX: Other effects of heat and light, initial encounter: T67.8XXA

## 2019-03-10 HISTORY — DX: Hypertensive chronic kidney disease with stage 1 through stage 4 chronic kidney disease, or unspecified chronic kidney disease: I12.9

## 2019-03-10 HISTORY — DX: Chronic kidney disease, stage 3 unspecified: N18.30

## 2019-03-10 LAB — CBC WITH DIFFERENTIAL/PLATELET
Abs Immature Granulocytes: 0.03 10*3/uL (ref 0.00–0.07)
Basophils Absolute: 0 10*3/uL (ref 0.0–0.1)
Basophils Relative: 0 %
Eosinophils Absolute: 0 10*3/uL (ref 0.0–0.5)
Eosinophils Relative: 0 %
HCT: 37.4 % — ABNORMAL LOW (ref 39.0–52.0)
Hemoglobin: 12.4 g/dL — ABNORMAL LOW (ref 13.0–17.0)
Immature Granulocytes: 0 %
Lymphocytes Relative: 19 %
Lymphs Abs: 1.9 10*3/uL (ref 0.7–4.0)
MCH: 28.4 pg (ref 26.0–34.0)
MCHC: 33.2 g/dL (ref 30.0–36.0)
MCV: 85.8 fL (ref 80.0–100.0)
Monocytes Absolute: 0.8 10*3/uL (ref 0.1–1.0)
Monocytes Relative: 8 %
Neutro Abs: 7.2 10*3/uL (ref 1.7–7.7)
Neutrophils Relative %: 73 %
Platelets: 268 10*3/uL (ref 150–400)
RBC: 4.36 MIL/uL (ref 4.22–5.81)
RDW: 12.6 % (ref 11.5–15.5)
WBC: 10 10*3/uL (ref 4.0–10.5)
nRBC: 0 % (ref 0.0–0.2)

## 2019-03-10 LAB — URINALYSIS, ROUTINE W REFLEX MICROSCOPIC
Bacteria, UA: NONE SEEN
Bilirubin Urine: NEGATIVE
Glucose, UA: >=500 mg/dL — AB
Ketones, ur: 5 mg/dL — AB
Leukocytes,Ua: NEGATIVE
Nitrite: NEGATIVE
Protein, ur: >=300 mg/dL — AB
Specific Gravity, Urine: 1.018 (ref 1.005–1.030)
pH: 5 (ref 5.0–8.0)

## 2019-03-10 LAB — COMPREHENSIVE METABOLIC PANEL
ALT: 42 U/L (ref 0–44)
AST: 25 U/L (ref 15–41)
Albumin: 3.7 g/dL (ref 3.5–5.0)
Alkaline Phosphatase: 94 U/L (ref 38–126)
Anion gap: 9 (ref 5–15)
BUN: 58 mg/dL — ABNORMAL HIGH (ref 6–20)
CO2: 28 mmol/L (ref 22–32)
Calcium: 9.3 mg/dL (ref 8.9–10.3)
Chloride: 106 mmol/L (ref 98–111)
Creatinine, Ser: 4.26 mg/dL — ABNORMAL HIGH (ref 0.61–1.24)
GFR calc Af Amer: 17 mL/min — ABNORMAL LOW (ref >60)
GFR calc non Af Amer: 15 mL/min — ABNORMAL LOW (ref >60)
Glucose, Bld: 173 mg/dL — ABNORMAL HIGH (ref 70–99)
Potassium: 3.6 mmol/L (ref 3.5–5.1)
Sodium: 143 mmol/L (ref 135–145)
Total Bilirubin: 1.4 mg/dL — ABNORMAL HIGH (ref 0.3–1.2)
Total Protein: 6.6 g/dL (ref 6.5–8.1)

## 2019-03-10 LAB — CREATININE, URINE, RANDOM: Creatinine, Urine: 194 mg/dL

## 2019-03-10 LAB — SODIUM, URINE, RANDOM: Sodium, Ur: 29 mmol/L

## 2019-03-10 LAB — CBG MONITORING, ED
Glucose-Capillary: 151 mg/dL — ABNORMAL HIGH (ref 70–99)
Glucose-Capillary: 192 mg/dL — ABNORMAL HIGH (ref 70–99)

## 2019-03-10 LAB — SARS CORONAVIRUS 2: SARS Coronavirus 2: NOT DETECTED

## 2019-03-10 LAB — GLUCOSE, CAPILLARY: Glucose-Capillary: 199 mg/dL — ABNORMAL HIGH (ref 70–99)

## 2019-03-10 LAB — CK: Total CK: 453 U/L — ABNORMAL HIGH (ref 49–397)

## 2019-03-10 MED ORDER — ENOXAPARIN SODIUM 30 MG/0.3ML ~~LOC~~ SOLN
30.0000 mg | SUBCUTANEOUS | Status: DC
  Administered 2019-03-10: 30 mg via SUBCUTANEOUS
  Filled 2019-03-10: qty 0.3

## 2019-03-10 MED ORDER — INSULIN ASPART 100 UNIT/ML ~~LOC~~ SOLN: 0.0000 [IU] | Freq: Every day | SUBCUTANEOUS | Status: DC

## 2019-03-10 MED ORDER — LACTATED RINGERS IV BOLUS
1000.0000 mL | Freq: Once | INTRAVENOUS | Status: AC
  Administered 2019-03-10: 1000 mL via INTRAVENOUS

## 2019-03-10 MED ORDER — SODIUM CHLORIDE 0.9 % IV SOLN
INTRAVENOUS | Status: DC
  Administered 2019-03-10 – 2019-03-11 (×2): via INTRAVENOUS

## 2019-03-10 MED ORDER — ACETAMINOPHEN 650 MG RE SUPP: 650.0000 mg | Freq: Four times a day (QID) | RECTAL | Status: DC | PRN

## 2019-03-10 MED ORDER — INSULIN ASPART 100 UNIT/ML ~~LOC~~ SOLN: 0.0000 [IU] | Freq: Three times a day (TID) | SUBCUTANEOUS | Status: DC

## 2019-03-10 MED ORDER — INSULIN GLARGINE 100 UNIT/ML ~~LOC~~ SOLN
30.0000 [IU] | Freq: Every day | SUBCUTANEOUS | Status: DC
  Administered 2019-03-10: 30 [IU] via SUBCUTANEOUS
  Filled 2019-03-10 (×2): qty 0.3

## 2019-03-10 MED ORDER — ONDANSETRON HCL 4 MG/2ML IJ SOLN: 4.0000 mg | Freq: Four times a day (QID) | INTRAMUSCULAR | Status: DC | PRN

## 2019-03-10 MED ORDER — POLYETHYLENE GLYCOL 3350 17 G PO PACK: 17.0000 g | PACK | Freq: Every day | ORAL | Status: DC | PRN

## 2019-03-10 MED ORDER — ACETAMINOPHEN 325 MG PO TABS: 650.0000 mg | ORAL_TABLET | Freq: Four times a day (QID) | ORAL | Status: DC | PRN

## 2019-03-10 MED ORDER — AMLODIPINE BESYLATE 10 MG PO TABS
10.0000 mg | ORAL_TABLET | Freq: Every day | ORAL | Status: DC
  Administered 2019-03-10 – 2019-03-11 (×2): 10 mg via ORAL
  Filled 2019-03-10 (×2): qty 1

## 2019-03-10 MED ORDER — PNEUMOCOCCAL VAC POLYVALENT 25 MCG/0.5ML IJ INJ
0.5000 mL | INJECTION | INTRAMUSCULAR | Status: AC
  Administered 2019-03-11: 0.5 mL via INTRAMUSCULAR
  Filled 2019-03-10: qty 0.5

## 2019-03-10 MED ORDER — ONDANSETRON HCL 4 MG PO TABS: 4.0000 mg | ORAL_TABLET | Freq: Four times a day (QID) | ORAL | Status: DC | PRN

## 2019-03-10 NOTE — ED Notes (Signed)
ED TO INPATIENT HANDOFF REPORT  ED Nurse Name and Phone #: [phone]  S Name/Age/Gender Louis Peters 53 y.o. male Room/Bed: 058C/058C  Code Status   Code Status: Not on file  Home/SNF/Other Home Patient oriented to: self, place, time and situation Is this baseline? Yes   Triage Complete: Triage complete  Chief Complaint n/v  Triage Note Pt BIB [facility] from home. Pt has been having n/v x3 days. Pt blood sugar has been running high. Pt checked sugar last night and was around 900. This am BS around 300. Pt complaining of dizziness upon standing.   Allergies Allergies  Allergen Reactions  . Fexofenadine Rash    unknown unknown unknown  . Latex Rash    Level of Care/Admitting Diagnosis ED Disposition    ED Disposition Condition [facility]: [place] [[id number]]  Level of Care: Med-Surg [16]  Covid Evaluation: Screening Protocol (No Symptoms)  Diagnosis: AKI (acute kidney injury) [facility]) [323557]  Admitting Physician: Bufford Lope [[id number]]  Attending Physician: MCDIARMID, TODD D [[id number]]  PT Class (Do Not Modify): Observation [104]  PT Acc Code (Do Not Modify): Observation [10022]       B Medical/Surgery History Past Medical History:  Diagnosis Date  . Benign hypertension with chronic kidney disease, stage III ([place]) 03/10/2019  . Chronic kidney disease (CKD), stage III (moderate) ([place]) 03/10/2019  . Diabetes mellitus type 1, uncomplicated ([place]) 11/08/621  . Diabetes mellitus without complication ([place])   . Diabetic retinopathy ([place])    PDR OU  . Heat Injury 03/10/2019  . Hypercholesterolemia 03/10/2019  . Hypertension   . Retinal detachment    TRD OD   Past Surgical History:  Procedure Laterality Date  . CATARACT EXTRACTION    . EYE SURGERY    . RETINAL DETACHMENT SURGERY       A IV Location/Drains/Wounds Patient Lines/Drains/Airways Status   Active Line/Drains/Airways    Name:   Placement date:   Placement time:   Site:    Days:   Peripheral IV 03/10/19 Left Antecubital   03/10/19    0823    Antecubital   less than 1          Intake/Output Last 24 hours  Intake/Output Summary (Last 24 hours) at 03/10/2019 1728 Last data filed at 03/10/2019 1428 Gross per 24 hour  Intake 2000 ml  Output -  Net 2000 ml    Labs/Imaging Results for orders placed or performed during the hospital encounter of 03/10/19 (from the past 48 hour(s))  CBG monitoring, ED     Status: Abnormal   Collection Time: 03/10/19  8:29 AM  Result Value Ref Range   Glucose-Capillary 151 (H) 70 - 99 mg/dL  Comprehensive metabolic panel     Status: Abnormal   Collection Time: 03/10/19  8:42 AM  Result Value Ref Range   Sodium 143 135 - 145 mmol/L   Potassium 3.6 3.5 - 5.1 mmol/L   Chloride 106 98 - 111 mmol/L   CO2 28 22 - 32 mmol/L   Glucose, Bld 173 (H) 70 - 99 mg/dL   BUN 58 (H) 6 - 20 mg/dL   Creatinine, Ser 4.26 (H) 0.61 - 1.24 mg/dL   Calcium 9.3 8.9 - 10.3 mg/dL   Total Protein 6.6 6.5 - 8.1 g/dL   Albumin 3.7 3.5 - 5.0 g/dL   AST 25 15 - 41 U/L   ALT 42 0 - 44 U/L   Alkaline Phosphatase 94 38 -  126 U/L   Total Bilirubin 1.4 (H) 0.3 - 1.2 mg/dL   GFR calc non Af Amer 15 (L) >60 mL/min   GFR calc Af Amer 17 (L) >60 mL/min   Anion gap 9 5 - 15    Comment: Performed at [facility] [address] [place], [place] [address]  CBC with Differential     Status: Abnormal   Collection Time: 03/10/19  8:42 AM  Result Value Ref Range   WBC 10.0 4.0 - 10.5 K/uL   RBC 4.36 4.22 - 5.81 MIL/uL   Hemoglobin 12.4 (L) 13.0 - 17.0 g/dL   HCT 37.4 (L) 39.0 - 52.0 %   MCV 85.8 80.0 - 100.0 fL   MCH 28.4 26.0 - 34.0 pg   MCHC 33.2 30.0 - 36.0 g/dL   RDW 12.6 11.5 - 15.5 %   Platelets 268 150 - 400 K/uL   nRBC 0.0 0.0 - 0.2 %   Neutrophils Relative % 73 %   Neutro Abs 7.2 1.7 - 7.7 K/uL   Lymphocytes Relative 19 %   Lymphs Abs 1.9 0.7 - 4.0 K/uL   Monocytes Relative 8 %   Monocytes Absolute 0.8 0.1 - 1.0 K/uL   Eosinophils  Relative 0 %   Eosinophils Absolute 0.0 0.0 - 0.5 K/uL   Basophils Relative 0 %   Basophils Absolute 0.0 0.0 - 0.1 K/uL   Immature Granulocytes 0 %   Abs Immature Granulocytes 0.03 0.00 - 0.07 K/uL    Comment: Performed at [facility] [address] [place], [place] [address]  CK     Status: Abnormal   Collection Time: 03/10/19  8:42 AM  Result Value Ref Range   Total CK 453 (H) 49 - 397 U/L    Comment: Performed at [facility], [place] [address], [place], [place] [address]  SARS Coronavirus 2     Status: None   Collection Time: 03/10/19 10:45 AM  Result Value Ref Range   SARS Coronavirus 2 NOT DETECTED NOT DETECTED    Comment: (NOTE) SARS-CoV-2 target nucleic acids are NOT DETECTED. The SARS-CoV-2 RNA is generally detectable in upper and lower respiratory specimens during the acute phase of infection.  Negative  results do not preclude SARS-CoV-2 infection, do not rule out co-infections with other pathogens, and should not be used as the sole basis for treatment or other patient management decisions.  Negative results must be combined with clinical observations, patient history, and epidemiological information. The expected result is Not Detected. Fact Sheet for Patients: http://www.biofiredefense.com/wp-content/uploads/2020/03/BIOFIRE-COVID -19-patients.pdf Fact Sheet for Healthcare Providers: http://www.biofiredefense.com/wp-content/uploads/2020/03/BIOFIRE-COVID -19-hcp.pdf This test is not yet approved or cleared by the [place] and  has been authorized for detection and/or diagnosis of SARS-CoV-2 by FDA under an Emergency Use Authorization (EUA).  This EUA will remain in effec t (meaning this test can be used) for the duration of  the COVID-19 declaration under Section 564(b)(1) of the Act, 21 U.S.C. section 360bbb-3(b)(1), unless the authorization is terminated or revoked sooner. Performed at [facility], [place] [address], [place],  [place] [address]   Urinalysis, Routine w reflex microscopic     Status: Abnormal   Collection Time: 03/10/19 11:16 AM  Result Value Ref Range   Color, Urine YELLOW YELLOW   APPearance HAZY (A) CLEAR   Specific Gravity, Urine 1.018 1.005 - 1.030   pH 5.0 5.0 - 8.0   Glucose, UA >=500 (A) NEGATIVE mg/dL   Hgb urine dipstick  SMALL (A) NEGATIVE   Bilirubin Urine NEGATIVE NEGATIVE   Ketones, ur 5 (A) NEGATIVE mg/dL   Protein, ur >=300 (A) NEGATIVE mg/dL   Nitrite NEGATIVE NEGATIVE   Leukocytes,Ua NEGATIVE NEGATIVE   RBC / HPF 0-5 0 - 5 RBC/hpf   WBC, UA 0-5 0 - 5 WBC/hpf   Bacteria, UA NONE SEEN NONE SEEN   Mucus PRESENT    Hyaline Casts, UA PRESENT     Comment: Performed at [facility] [address] [place], [place] [address]  Sodium, urine, random     Status: None   Collection Time: 03/10/19 11:16 AM  Result Value Ref Range   Sodium, Ur 29 mmol/L    Comment: Performed at [facility] [address] [place], [place] [address]  Creatinine, urine, random     Status: None   Collection Time: 03/10/19 11:16 AM  Result Value Ref Range   Creatinine, Urine 194.00 mg/dL    Comment: Performed at [facility] [address] [place], [place] [address]   No results found.  Pending Labs Unresulted Labs (From admission, onward)    Start     Ordered   03/11/19 0500  CK  Tomorrow morning,   R     03/10/19 1111   Signed and Held  HIV antibody (Routine Testing)  Once,   R     Signed and Held   Signed and Held  CBC  (enoxaparin (LOVENOX)    CrCl < 30 ml/min)  Once,   R    Comments:  Baseline for enoxaparin therapy IF NOT ALREADY DRAWN.  Notify MD if PLT < 100 K.    Signed and Held   Signed and Held  Creatinine, serum  (enoxaparin (LOVENOX)    CrCl < 30 ml/min)  Once,   R    Comments:  Baseline for enoxaparin therapy IF NOT ALREADY DRAWN.    Signed and Held   Signed and Held  Creatinine, serum  (enoxaparin (LOVENOX)    CrCl < 30 ml/min)  Weekly,   R    Comments:  while on  enoxaparin therapy.    Signed and Held   Signed and Held  Hemoglobin A1c  Tomorrow morning,   R     Signed and Held   Signed and Held  Basic metabolic panel  Tomorrow morning,   R     Signed and Held          Vitals/Pain Today's Vitals   03/10/19 1045 03/10/19 1100 03/10/19 1442 03/10/19 1540  BP: (!) 145/91 139/88 (!) 159/77   Pulse: 72 68 72   Resp: 10 19 16    Temp:      TempSrc:      SpO2: 100% 100% 100%   Weight:      Height:      PainSc:    0-No pain    Isolation Precautions No active isolations  Medications Medications  lactated ringers bolus 1,000 mL (0 mLs Intravenous Stopped 03/10/19 1428)  lactated ringers bolus 1,000 mL (0 mLs Intravenous Stopped 03/10/19 1428)    Mobility walks Low fall risk   Focused Assessments   R Recommendations: See Admitting Provider Note  Report given to:   Additional Notes:

## 2019-03-10 NOTE — Plan of Care (Signed)
Patient has been living with diabetes for a while. He admits to not eating regularly during the day. We discussed ways to help him remember to at least a few small snacks per day. Patient verbalizes understanding with teach back.

## 2019-03-10 NOTE — ED Notes (Signed)
Pt wife [name] [phone]

## 2019-03-10 NOTE — ED Notes (Signed)
Attempted to call report

## 2019-03-10 NOTE — ED Provider Notes (Addendum)
[facility]ERGENCY DEPARTMENT Provider Note   CSN: [id number] Arrival date & time: 03/10/19  0820    History   Chief Complaint Chief Complaint  Patient presents with  . Nausea  . Emesis  . Dizziness    HPI BARTLOMIEJ JENKINSON is a 53 y.o. male.     HPI  53 year old male presents with dizziness.  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  This is usually around 5 PM.  Yesterday he was driving too close to a ditch on his lawnmower and the lawnmower went into the ditch.  After this he was dizzy and had a hard time crawling out of the ditch.  He had to go home and go to bed.  He never hit his head, injured himself or passed out. Still dizzy this morning and he vomited this morning.  EMS gave him IV fluids and Zofran.  He states the dizziness and all discomfort is gone.  He never had any headache, chest pain, abdominal pain, diarrhea or urinary symptoms.  No cough or fever.  He states he has been drinking water during this time.  Past Medical History:  Diagnosis Date  . Diabetes mellitus without complication ([place])   . Diabetic retinopathy ([place])    PDR OU  . Hypertension   . Retinal detachment    TRD OD    Patient Active Problem List   Diagnosis Date Noted  . Diabetes ([place]) 12/09/2015    Past Surgical History:  Procedure Laterality Date  . CATARACT EXTRACTION    . EYE SURGERY    . RETINAL DETACHMENT SURGERY          Home Medications    Prior to Admission medications   Medication Sig Start Date End Date Taking? Authorizing Provider  albuterol (PROVENTIL HFA;VENTOLIN HFA) 108 (90 Base) MCG/ACT inhaler Inhale 2 puffs into the lungs every 6 (six) hours as needed for wheezing or shortness of breath.   Yes [provider]  amLODipine (NORVASC) 10 MG tablet Take 10 mg by mouth daily.  01/16/17  Yes [provider]  ammonium lactate (LAC-HYDRIN) 12 % lotion Apply topically. 10/01/18 10/01/19 Yes  [provider]  atorvastatin (LIPITOR) 20 MG tablet take 1 tablet by mouth once daily 09/26/16  Yes [provider]  glucagon 1 MG injection Inject 1 mg into the skin once as needed (low Glucose).  07/31/18  Yes [provider]  HUMALOG KWIKPEN 100 UNIT/ML KiwkPen Inject 5-8 Units into the skin 3 (three) times daily. Take 5 units at breakfast and lunch and Take 8 units at dinner. 02/08/17  Yes [provider]  ibuprofen (ADVIL,MOTRIN) 800 MG tablet Take 800 mg by mouth every 8 (eight) hours as needed for moderate pain.   Yes [provider]  Insulin Glargine (LANTUS) 100 UNIT/ML Solostar Pen Inject 40 Units into the skin daily at 10 pm.  04/05/17  Yes [provider]  losartan (COZAAR) 100 MG tablet Take 100 mg by mouth daily.  08/12/18  Yes [provider]  montelukast (SINGULAIR) 10 MG tablet Take 10 mg by mouth at bedtime.   Yes [provider]  silver sulfADIAZINE (SILVADENE) 1 % cream Apply 1 application topically daily.  08/03/18  Yes [provider]  Blood Glucose Monitoring Suppl ([place]) w/Device KIT by Does not apply route. 07/31/18   [provider]  glucose blood (ONETOUCH VERIO) test strip  Use to check blood sugar 8 time(s) daily. 07/31/18 07/31/19  [provider]  Insulin Glargine (LANTUS SOLOSTAR) 100 UNIT/ML Solostar Pen Inject 50 Units into the skin every morning. And pen needles 1/day Patient not taking: Reported on 03/10/2019 02/03/16   Renato Shin, MD  Insulin Pen Needle (FIFTY50 PEN NEEDLES) 32G X 4 MM MISC Use with lantus and humalog 4 imes per day 07/26/17   [provider]  losartan-hydrochlorothiazide (HYZAAR) 100-25 MG tablet Take 1 tablet by mouth daily.  12/04/16 12/04/17  [provider]  ONE TOUCH LANCETS MISC Use to check blood sugar 8 time(s) daily 07/31/18   [provider]    Family History Family History  Problem Relation Age  of Onset  . Diabetes Maternal Grandmother   . Diabetes Sister     Social History Social History   Tobacco Use  . Smoking status: Never Smoker  . Smokeless tobacco: Never Used  Substance Use Topics  . Alcohol use: Yes    Comment: occ  . Drug use: No     Allergies   Fexofenadine and Latex   Review of Systems Review of Systems  Constitutional: Negative for fever.  Respiratory: Negative for shortness of breath.   Cardiovascular: Negative for chest pain.  Gastrointestinal: Positive for vomiting. Negative for abdominal pain and diarrhea.  Genitourinary: Negative for dysuria.  Neurological: Positive for dizziness. Negative for weakness, numbness and headaches.  All other systems reviewed and are negative.    Physical Exam Updated Vital Signs BP (!) 153/87   Pulse 67   Temp 97.9 F (36.6 C) (Oral)   Resp 16   Ht 5' 10"  (1.778 m)   Wt 90.7 kg   SpO2 100%   BMI 28.70 kg/m   Physical Exam Vitals signs and nursing note reviewed.  Constitutional:      General: He is not in acute distress.    Appearance: He is well-developed. He is not ill-appearing or diaphoretic.  HENT:     Head: Normocephalic and atraumatic.     Right Ear: External ear normal.     Left Ear: External ear normal.     Nose: Nose normal.     Mouth/Throat:     Mouth: Mucous membranes are dry.  Eyes:     General:        Right eye: No discharge.        Left eye: No discharge.     Pupils: Pupils are equal, round, and reactive to light.  Neck:     Musculoskeletal: Neck supple.  Cardiovascular:     Rate and Rhythm: Normal rate and regular rhythm.     Heart sounds: Normal heart sounds.  Pulmonary:     Effort: Pulmonary effort is normal.     Breath sounds: Normal breath sounds.  Abdominal:     General: There is no distension.     Palpations: Abdomen is soft.     Tenderness: There is no abdominal tenderness.  Skin:    General: Skin is warm and dry.  Neurological:     Mental Status: He is alert.      Comments: CN 3-12 grossly intact. 5/5 strength in all 4 extremities. Grossly normal sensation. Normal finger to nose.   Psychiatric:        Mood and Affect: Mood is not anxious.      ED Treatments / Results  Labs (all labs ordered are listed, but only abnormal results are displayed) Labs Reviewed  COMPREHENSIVE METABOLIC PANEL - Abnormal;  Notable for the following components:      Result Value   Glucose, Bld 173 (*)    BUN 58 (*)    Creatinine, Ser 4.26 (*)    Total Bilirubin 1.4 (*)    GFR calc non Af Amer 15 (*)    GFR calc Af Amer 17 (*)    All other components within normal limits  CBC WITH DIFFERENTIAL/PLATELET - Abnormal; Notable for the following components:   Hemoglobin 12.4 (*)    HCT 37.4 (*)    All other components within normal limits  CBG MONITORING, ED - Abnormal; Notable for the following components:   Glucose-Capillary 151 (*)    All other components within normal limits  URINALYSIS, ROUTINE W REFLEX MICROSCOPIC  CK    EKG EKG Interpretation  Date/Time:  Monday March 10 2019 08:29:12 EDT Ventricular Rate:  69 PR Interval:    QRS Duration: 95 QT Interval:  398 QTC Calculation: 427 R Axis:   62 Text Interpretation:  Sinus rhythm Borderline prolonged PR interval Probable left atrial enlargement Nonspecific T abnormalities, diffuse leads T wave changes stable compared to 2018 Confirmed by Sherwood Gambler [id number]) on 03/10/2019 8:33:18 AM   Radiology No results found.  Procedures Procedures (including critical care time)  Medications Ordered in ED Medications  lactated ringers bolus 1,000 mL (has no administration in time range)  lactated ringers bolus 1,000 mL (1,000 mLs Intravenous New Bag/Given 03/10/19 0852)     Initial Impression / Assessment and Plan / ED Course  I have reviewed the triage vital signs and the nursing notes.  Pertinent labs & imaging results that were available during my care of the patient were reviewed by me and considered  in my medical decision making (see chart for details).        Patient's work-up shows significantly worse creatinine of 4.26 compared to 2.10 in November 2019 in care everywhere.  Likely this is from dehydration from working in the heat.  He will be given more IV fluids, CK will be checked and he will need admission. He reports normal UOP at home. Family practice to admit.  Final Clinical Impressions(s) / ED Diagnoses   Final diagnoses:  Acute kidney injury superimposed on chronic kidney disease [facility])    ED Discharge Orders    None       Sherwood Gambler, MD 03/10/19 1023    Sherwood Gambler, MD 03/10/19 1024

## 2019-03-10 NOTE — H&P (Signed)
[facility] Admission History and Physical Service Pager: [phone]  Patient name: Louis Peters Medical record number: [id number] Date of birth: July 29, 1966 Age: 53 y.o. Gender: male  Primary Care Provider: System, Pcp Not In Consultants: none Code Status: Full Preferred Emergency Contact: wife [name] [phone]  Chief Complaint: dizziness  Assessment and Plan: Louis Peters is a 53 y.o. male presenting with nausea/vomiting and dizziness. PMH is significant for uncontrolled T1DM with neuropathy, HTN, HLD, CKD 3  Acute kidney injury on CKD.  Likely prerenal given patient history of working outside in the heat and being unable to keep up with his insensible fluid losses causing dehydration.  His creatinine on admission is 4.26 up from baseline of 2.1.  His CK is elevated at 453.  He has had no myalgias, hematuria.  He does follow with [facility] nephrology for CKD but has not seen them in some time.  Feels some improvement with IV hydration. -Place in observation, attending Dr. McDiarmid - s/p LR bolus x2, NS_0 /hr -Check additional creatinine kinase tomorrow to make sure is not uptrending -Urine studies pending for FENa -UA pending -Hold home losartan -Avoid nephrotoxic medications -Monitor creatinine  Uncontrolled type 1 diabetes.  Last A1c 10.8 on 11/06/2018.  At home he takes Lantus 30 units daily with Humalog 5/5/6 units with meals.  He follows with [facility] endocrinology. -Lantus 30 units daily -Sensitive sliding scale + HS -Monitor CBGs  Hypertension.  On admission blood pressure was elevated to 153/87.  Now improved to 139/88.  At home he is on amlodipine 10 mg daily, losartan 100 mg daily.  He states he is not on hydrochlorothiazide. -Continue home amlodipine -Holding home losartan as per above  Hyperlipidemia -Holding home atorvastatin due to elevated CK  FEN/GI: heart healthy carb modified, NS_1 /hr Prophylaxis:  lovenox  Disposition: place in observation  History of Present Illness:  Louis Peters is a 53 y.o. male presenting with dizziness.  Patient states that he has generally felt unwell over the last 3 days.  On Friday he noticed he started having some nausea after eating a burger and fries from [employer].  He thought that this was due to the food that he ate.  However over the next couple of days he continued to feel nauseous and unwell throughout the day and vomited once at night.  He would generally feel better after vomiting.  He states that over the last 3 days he has worked outside physically in some capacity.  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  He states that when he was mowing the lawn he became lightheaded.  He fell into a ditch.  He hit a tree but did not hit his head.  He has had no bruising.  No loss of consciousness.  He had no associated chest pain, palpitations, shortness of breath. He states that despite being outdoors in the heat for the last 3 days he has tried to drink a large amount of water and has had normal urine output.  He has not had any hematuria or dark urine.  He has had no muscle aches.  Review Of Systems: Per HPI with the following additions:   Review of Systems  Constitutional: Negative for chills and fever.  Respiratory: Negative for shortness of breath and wheezing.   Cardiovascular: Negative for chest pain and palpitations.  Gastrointestinal: Positive for nausea and vomiting. Negative for abdominal pain.  Genitourinary: Negative for dysuria, frequency, hematuria and urgency.  Musculoskeletal: Negative for myalgias.  Skin: Negative for rash.  Neurological: Positive for dizziness. Negative for weakness.    Patient Active Problem List   Diagnosis Date Noted  . Diabetes ([place]) 12/09/2015    Past Medical History: Past Medical History:  Diagnosis Date  . Diabetes mellitus without  complication ([place])   . Diabetic retinopathy ([place])    PDR OU  . Hypertension   . Retinal detachment    TRD OD    Past Surgical History: Past Surgical History:  Procedure Laterality Date  . CATARACT EXTRACTION    . EYE SURGERY    . RETINAL DETACHMENT SURGERY      Social History: Social History   Tobacco Use  . Smoking status: Never Smoker  . Smokeless tobacco: Never Used  Substance Use Topics  . Alcohol use: Yes    Comment: occ  . Drug use: No   Additional social history: 1-2 drinks every few months. Lives at home with wife.  Please also refer to relevant sections of EMR.  Family History: Family History  Problem Relation Age of Onset  . Diabetes Maternal Grandmother   . Diabetes Sister     Allergies and Medications: Allergies  Allergen Reactions  . Fexofenadine Rash    unknown unknown unknown  . Latex Rash   No current facility-administered medications on file prior to encounter.    Current Outpatient Medications on File Prior to Encounter  Medication Sig Dispense Refill  . albuterol (PROVENTIL HFA;VENTOLIN HFA) 108 (90 Base) MCG/ACT inhaler Inhale 2 puffs into the lungs every 6 (six) hours as needed for wheezing or shortness of breath.    Marland Kitchen amLODipine (NORVASC) 10 MG tablet Take 10 mg by mouth daily.     Marland Kitchen ammonium lactate (LAC-HYDRIN) 12 % lotion Apply topically.    Marland Kitchen atorvastatin (LIPITOR) 20 MG tablet take 1 tablet by mouth once daily    . glucagon 1 MG injection Inject 1 mg into the skin once as needed (low Glucose).     Marland Kitchen HUMALOG KWIKPEN 100 UNIT/ML KiwkPen Inject 5-8 Units into the skin 3 (three) times daily. Take 5 units at breakfast and lunch and Take 8 units at dinner.  0  . ibuprofen (ADVIL,MOTRIN) 800 MG tablet Take 800 mg by mouth every 8 (eight) hours as needed for moderate pain.    . Insulin Glargine (LANTUS) 100 UNIT/ML Solostar Pen Inject 40 Units into the skin daily at 10 pm.     . losartan (COZAAR) 100 MG tablet Take 100 mg by mouth daily.      . montelukast (SINGULAIR) 10 MG tablet Take 10 mg by mouth at bedtime.    . silver sulfADIAZINE (SILVADENE) 1 % cream Apply 1 application topically daily.     . Blood Glucose Monitoring Suppl ([place]) w/Device KIT by Does not apply route.    Marland Kitchen glucose blood (ONETOUCH VERIO) test strip Use to check blood sugar 8 time(s) daily.    . Insulin Glargine (LANTUS SOLOSTAR) 100 UNIT/ML Solostar Pen Inject 50 Units into the skin every morning. And pen needles 1/day (Patient not taking: Reported on 03/10/2019) 5 pen PRN  . Insulin Pen Needle (FIFTY50 PEN NEEDLES) 32G X 4 MM MISC Use with lantus and humalog 4 imes per day    . losartan-hydrochlorothiazide (HYZAAR) 100-25 MG tablet Take 1 tablet by mouth daily.     . ONE TOUCH LANCETS MISC Use to check blood sugar 8 time(s)  daily      Objective: BP (!) 153/87   Pulse 67   Temp 97.9 F (36.6 C) (Oral)   Resp 16   Ht _0  (1.778 m)   Wt 90.7 kg   SpO2 100%   BMI 28.70 kg/m  Exam: General: Sitting up in bed, in no acute distress Eyes: Pupils equal and round. ENTM: Dry mucous membranes.  Oropharynx nonerythematous. Neck: Supple, midline Cardiovascular: Regular rate and rhythm, no murmurs Respiratory: Clear to auscultation bilaterally, normal work of breathing on room air. Gastrointestinal: Soft, nondistended, nontender, positive bowel sounds MSK: Moving all limbs equally, normal tone Derm: Warm and dry.  No rashes or bruising or abrasions Neuro: Alert, oriented, grossly normal. Psych: Appropriate affect  Labs and Imaging: CBC BMET  Recent Labs  Lab 03/10/19 0842  WBC 10.0  HGB 12.4*  HCT 37.4*  PLT 268   Recent Labs  Lab 03/10/19 0842  NA 143  K 3.6  CL 106  CO2 28  BUN 58*  CREATININE 4.26*  GLUCOSE 173*  CALCIUM 9.3     EKG normal sinus rhythm, old T wave inversions compared to prior.   Bufford Lope, DO 03/10/2019, 10:21 AM PGY-3, [place] Intern pager: [phone], text pages  welcome

## 2019-03-10 NOTE — ED Triage Notes (Signed)
Pt BIB [facility] from home. Pt has been having n/v x3 days. Pt blood sugar has been running high. Pt checked sugar last night and was around 900. This am BS around 300. Pt complaining of dizziness upon standing.

## 2019-03-11 DIAGNOSIS — T678XXA Other effects of heat and light, initial encounter: Secondary | ICD-10-CM | POA: Diagnosis not present

## 2019-03-11 DIAGNOSIS — N179 Acute kidney failure, unspecified: Secondary | ICD-10-CM | POA: Diagnosis not present

## 2019-03-11 DIAGNOSIS — I129 Hypertensive chronic kidney disease with stage 1 through stage 4 chronic kidney disease, or unspecified chronic kidney disease: Secondary | ICD-10-CM | POA: Diagnosis not present

## 2019-03-11 DIAGNOSIS — N183 Chronic kidney disease, stage 3 (moderate): Secondary | ICD-10-CM | POA: Diagnosis not present

## 2019-03-11 LAB — BASIC METABOLIC PANEL
Anion gap: 8 (ref 5–15)
BUN: 45 mg/dL — ABNORMAL HIGH (ref 6–20)
CO2: 27 mmol/L (ref 22–32)
Calcium: 8.8 mg/dL — ABNORMAL LOW (ref 8.9–10.3)
Chloride: 107 mmol/L (ref 98–111)
Creatinine, Ser: 3.14 mg/dL — ABNORMAL HIGH (ref 0.61–1.24)
GFR calc Af Amer: 25 mL/min — ABNORMAL LOW (ref >60)
GFR calc non Af Amer: 21 mL/min — ABNORMAL LOW (ref >60)
Glucose, Bld: 178 mg/dL — ABNORMAL HIGH (ref 70–99)
Potassium: 4 mmol/L (ref 3.5–5.1)
Sodium: 142 mmol/L (ref 135–145)

## 2019-03-11 LAB — CK: Total CK: 286 U/L (ref 49–397)

## 2019-03-11 LAB — HEMOGLOBIN A1C
Hgb A1c MFr Bld: 11.3 % — ABNORMAL HIGH (ref 4.8–5.6)
Mean Plasma Glucose: 277.61 mg/dL

## 2019-03-11 LAB — GLUCOSE, CAPILLARY
Glucose-Capillary: 85 mg/dL (ref 70–99)
Glucose-Capillary: 240 mg/dL — ABNORMAL HIGH (ref 70–99)

## 2019-03-11 LAB — HIV ANTIBODY (ROUTINE TESTING W REFLEX): HIV Screen 4th Generation wRfx: NONREACTIVE

## 2019-03-11 MED ORDER — SODIUM CHLORIDE 0.9 % IV BOLUS
1000.0000 mL | Freq: Once | INTRAVENOUS | Status: AC
  Administered 2019-03-11: 1000 mL via INTRAVENOUS

## 2019-03-11 NOTE — Progress Notes (Signed)
Family Medicine Teaching Service Daily Progress Note Intern Pager: [phone]  Patient name: Louis Peters Medical record number: [id number] Date of birth: 1966/08/04 Age: 53 y.o. Gender: male  Primary Care Provider: System, Pcp Not In Consultants: None Code Status: Full  Pt Overview and Major Events to Date:  6/8 Admitted to FPTS   Assessment and Plan: Louis Peters is a 53 y.o. male presenting with nausea/vomiting and dizziness. PMH is significant for uncontrolled T1DM with neuropathy, HTN, HLD, CKD 3  Acute kidney injury on CKD: Improving   FeNa 0.4%, indicating prerenal cause.  Cr 4.26 to 3.14, baseline Cr 2.1.  CK 453>286.  Patient received 2 LR boluses and was on normal saline at 150 cc/h overnight.  UA with small hemoglobin and 5 ketones, greater than 500 glucose, greater than 300 protein.  This AM reports some dizziness while in the bathroom, otherwise feeling improved. - repeat NS bolus x1 - check orthostatics after bolus - s/p LR bolus x2, NS@150cc /hr -Hold home losartan -Avoid nephrotoxic medications  Uncontrolled type 1 diabetes.  At home he takes Lantus 30 units daily with Humalog 5/5/6 units with meals.  He [facility] with [facility] endocrinology.  A1c on admission 11.3.  CBG this a.m. 178. -Lantus 30 units daily -Sensitive sliding scale + HS -Monitor CBGs  Hypertension.  At home he is on amlodipine 10 mg daily, losartan 100 mg daily.  He states he is not on hydrochlorothiazide.  Losartan has been held due to AKI.  BP this AM 131/64.  Highest BP 182/99 at 1800.   -Continue home amlodipine -Holding home losartan as per above  Hyperlipidemia -Holding home atorvastatin due to elevated CK  FEN/GI: Heart healthy carb modified PPx: Lovenox  Disposition: Likely home today  Subjective:  Patient states that he did have some dizziness this a.m. while walking to the restroom.  Otherwise has no complaints, states that he feels better, wants to leave  today.  Objective: Temp:  [97.9 F (36.6 C)-98.5 F (36.9 C)] 98.3 F (36.8 C) (06/08 2335) Pulse Rate:  [67-82] 73 (06/08 2335) Resp:  [10-19] 14 (06/08 1833) BP: (131-182)/(64-99) 131/64 (06/08 2335) SpO2:  [98 %-100 %] 98 % (06/08 2335) Weight:  [90.7 kg] 90.7 kg (06/08 0827)  Physical Exam:  General: 53 y.o. male in NAD Cardio: RRR no m/r/g Lungs: CTAB, no wheezing, no rhonchi, no crackles, no IWOB on RA Abdomen: Soft, non-tender to palpation, non-distended, positive bowel sounds Skin: warm and dry Extremities: No edema   Laboratory: Recent Labs  Lab 03/10/19 0842  WBC 10.0  HGB 12.4*  HCT 37.4*  PLT 268   Recent Labs  Lab 03/10/19 0842 03/11/19 0437  NA 143 142  K 3.6 4.0  CL 106 107  CO2 28 27  BUN 58* 45*  CREATININE 4.26* 3.14*  CALCIUM 9.3 8.8*  PROT 6.6  --   BILITOT 1.4*  --   ALKPHOS 94  --   ALT 42  --   AST 25  --   GLUCOSE 173* 178*    Imaging/Diagnostic Tests: No results found.  [place], DO 03/11/2019, 7:25 AM PGY-1, [place] Intern pager: [phone], text pages welcome

## 2019-03-11 NOTE — Care Management Obs Status (Signed)
MEDICARE OBSERVATION STATUS NOTIFICATION   Patient Details  Name: Louis Peters MRN: [id number] Date of Birth: 12-Jul-1966   Medicare Observation Status Notification Given:  Yes    Zenon Mayo, RN 03/11/2019, 1:08 PM

## 2019-03-11 NOTE — TOC Transition Note (Signed)
Transition of Care [facility]) - CM/SW Discharge Note   Patient Details  Name: Louis Peters MRN: [id number] Date of Birth: 11/16/1965  [facility] CM/SW Contact:  Zenon Mayo, RN Phone Number: 03/11/2019, 9:38 AM   Clinical Narrative:    From home with wife, pta indep, he has PCP Bernerd Limbo with [facility]. He has no issues with getting his medications, he has transport home at dc.     Final next level of care: Home/Self Care Barriers to Discharge: No Barriers Identified   Patient Goals and CMS Choice Patient states their goals for this hospitalization and ongoing recovery are:: lay back and chill and let wife take care of him   Choice offered to / list presented to : NA  Discharge Placement                       Discharge Plan and Services In-house Referral: NA Discharge Planning Services: CM Consult Post Acute Care Choice: NA          DME Arranged: N/A DME Agency: NA       HH Arranged: NA HH Agency: NA        Social Determinants of Health (SDOH) Interventions     Readmission Risk Interventions No flowsheet data found.

## 2019-03-11 NOTE — Discharge Summary (Signed)
Family Medicine Teaching Service Hospital Discharge Summary  Patient name: Louis Peters Medical record number: [id number] Date of birth: 10/20/1965 Age: 53 y.o. Gender: male Date of Admission: 03/10/2019  Date of Discharge: 03/11/2019 Admitting Physician: Todd D McDiarmid, MD  Primary Care Provider: System, Pcp Not In Consultants: None  Indication for Hospitalization: AKI  Discharge Diagnoses/Problem List:  AKI on CKD Uncontrolled T1DM Hypertension Hyperlipidemia  Disposition: Home  Discharge Condition: Stable/improved  Discharge Exam:   Physical Exam:  General: 53 y.o. male in NAD Cardio: RRR no m/r/g Lungs: CTAB, no wheezing, no rhonchi, no crackles, no IWOB on RA Abdomen: Soft, non-tender to palpation, non-distended, positive bowel sounds Skin: warm and dry Extremities: No edema  Brief Hospital Course:  Louis Petersis a 52 y.o.malepresenting with nausea/vomiting and dizziness. PMH is significant foruncontrolled T1DM with neuropathy, HTN, HLD, CKD 3.  His hospital course is outlined below.  Patient presented to hospital with AKI and elevated CK, after having nausea and vomiting after being outside exerting himself.  Admission details can be found in H&P.  Statin on admission was 4.26, elevated from patient's baseline of 2.1.  CK also elevated to 453.  Patient received fluid resuscitation and creatinine improved to 3.14, CK improved to 286.  FeNa was indicative of prerenal cause.  Prior to discharge, patient had orthostatic vitals obtained that were positive by systolic blood pressure, but patient was asymptomatic during that time.  Systolic blood pressure 176-150 from lying to sitting.  Patient was discharged with instructions to continue to hydrate aggressively while at home.  He was also instructed to follow-up with his PCP within 1 week for repeat blood work.  His losartan was held while inpatient, but he was advised to started the day after discharge on 6/10.  Patient  also advised to hold Lipitor until advised to restart by PCP after recheck of CK.  Issues for Follow Up:  1. Will need to recheck BMP and CK to ensure continued improvement.  If CK greatly improved or WNL, consider restarting Lipitor. 2. Consider rechecking patient's orthostatic vital signs as they were positive prior to discharge on systolic blood pressure, but patient asymptomatic. 3. Patient's diabetes not well controlled, A1c on admission 11.3.  Consider adjustment of patient's insulin regimen.  Significant Procedures: None  Significant Labs and Imaging:  Recent Labs  Lab 03/10/19 0842  WBC 10.0  HGB 12.4*  HCT 37.4*  PLT 268   Recent Labs  Lab 03/10/19 0842 03/11/19 0437  NA 143 142  K 3.6 4.0  CL 106 107  CO2 28 27  GLUCOSE 173* 178*  BUN 58* 45*  CREATININE 4.26* 3.14*  CALCIUM 9.3 8.8*  ALKPHOS 94  --   AST 25  --   ALT 42  --   ALBUMIN 3.7  --     No results found.   Results/Tests Pending at Time of Discharge: none  Discharge Medications:  Allergies as of 03/11/2019      Reactions   Fexofenadine Rash   unknown unknown unknown   Latex Rash      Medication List    STOP taking these medications   atorvastatin 20 MG tablet Commonly known as:  LIPITOR   ibuprofen 800 MG tablet Commonly known as:  ADVIL     TAKE these medications   albuterol 108 (90 Base) MCG/ACT inhaler Commonly known as:  VENTOLIN HFA Inhale 2 puffs into the lungs every 6 (six) hours as needed for wheezing or shortness of breath.     amLODipine 10 MG tablet Commonly known as:  NORVASC Take 10 mg by mouth daily.   ammonium lactate 12 % lotion Commonly known as:  LAC-HYDRIN Apply topically.   Fifty50 Pen Needles 32G X 4 MM Misc Generic drug:  Insulin Pen Needle Use with lantus and humalog 4 imes per day   glucagon 1 MG injection Inject 1 mg into the skin once as needed (low Glucose).   HumaLOG KwikPen 100 UNIT/ML KwikPen Generic drug:  insulin lispro Inject 5-8 Units  into the skin 3 (three) times daily. Take 5 units at breakfast and lunch and Take 8 units at dinner.   Insulin Glargine 100 UNIT/ML Solostar Pen Commonly known as:  LANTUS Inject 40 Units into the skin daily at 10 pm.   losartan 100 MG tablet Commonly known as:  COZAAR Take 100 mg by mouth daily.   montelukast 10 MG tablet Commonly known as:  SINGULAIR Take 10 mg by mouth at bedtime.   ONE TOUCH LANCETS Misc Use to check blood sugar 8 time(s) daily   OneTouch Verio Flex System w/Device Kit by Does not apply route.   OneTouch Verio test strip Generic drug:  glucose blood Use to check blood sugar 8 time(s) daily.   silver sulfADIAZINE 1 % cream Commonly known as:  SILVADENE Apply 1 application topically daily.       Discharge Instructions: Please refer to Patient Instructions section of EMR for full details.  Patient was counseled important signs and symptoms that should prompt return to medical care, changes in medications, dietary instructions, activity restrictions, and follow up appointments.   Follow-Up Appointments: Follow-up Information    Bouska, David, MD Follow up on 03/19/2019.   Specialty:  Family Medicine Why:  9:30 for hospital follow up,if you would like a virtual apt please call doctor office to let them know Contact information: [address] [place] [address] [phone]           Meccariello, Bailey J, DO 03/11/2019, 1:15 PM PGY-1, [facility]  

## 2019-03-11 NOTE — Discharge Instructions (Signed)
You were admitted to the hospital for dehydration and worsening of your kidney function in the setting.  This is improving with fluids.  You should continue to drink a lot of fluids (water is best) for the next few days and ensure that you stay hydrated if you are outside.  You can restart your home losartan tomorrow, 6/10.  Should you start having dizziness, weakness, difficulty walking, chest pain, shortness of breath you should be seen right away.  Do not restart your Lipitor until you are told that you can by your doctor.  Please make sure that you are seen by your regular doctor within a week for blood work.

## 2019-05-24 ENCOUNTER — Other Ambulatory Visit: Payer: Self-pay

## 2019-05-24 ENCOUNTER — Encounter (HOSPITAL_COMMUNITY): Payer: Self-pay | Admitting: Emergency Medicine

## 2019-05-24 ENCOUNTER — Emergency Department (HOSPITAL_COMMUNITY)
Admission: EM | Admit: 2019-05-24 | Discharge: 2019-05-24 | Disposition: A | Discharge status: Home / Self Care | Payer: Medicare Other | Attending: Emergency Medicine | Admitting: Emergency Medicine

## 2019-05-24 DIAGNOSIS — E860 Dehydration: Secondary | ICD-10-CM

## 2019-05-24 DIAGNOSIS — Z79899 Other long term (current) drug therapy: Secondary | ICD-10-CM | POA: Insufficient documentation

## 2019-05-24 DIAGNOSIS — N183 Chronic kidney disease, stage 3 (moderate): Secondary | ICD-10-CM | POA: Diagnosis not present

## 2019-05-24 DIAGNOSIS — I129 Hypertensive chronic kidney disease with stage 1 through stage 4 chronic kidney disease, or unspecified chronic kidney disease: Secondary | ICD-10-CM | POA: Insufficient documentation

## 2019-05-24 DIAGNOSIS — I10 Essential (primary) hypertension: Secondary | ICD-10-CM

## 2019-05-24 DIAGNOSIS — R739 Hyperglycemia, unspecified: Secondary | ICD-10-CM

## 2019-05-24 DIAGNOSIS — R112 Nausea with vomiting, unspecified: Secondary | ICD-10-CM | POA: Diagnosis present

## 2019-05-24 DIAGNOSIS — E1065 Type 1 diabetes mellitus with hyperglycemia: Secondary | ICD-10-CM | POA: Insufficient documentation

## 2019-05-24 DIAGNOSIS — Z794 Long term (current) use of insulin: Secondary | ICD-10-CM | POA: Insufficient documentation

## 2019-05-24 DIAGNOSIS — E1122 Type 2 diabetes mellitus with diabetic chronic kidney disease: Secondary | ICD-10-CM | POA: Diagnosis not present

## 2019-05-24 DIAGNOSIS — Z91040 Latex allergy status: Secondary | ICD-10-CM | POA: Insufficient documentation

## 2019-05-24 DIAGNOSIS — N189 Chronic kidney disease, unspecified: Secondary | ICD-10-CM

## 2019-05-24 LAB — URINALYSIS, ROUTINE W REFLEX MICROSCOPIC
Bacteria, UA: NONE SEEN
Bilirubin Urine: NEGATIVE
Glucose, UA: >=500 mg/dL — AB
Ketones, ur: 5 mg/dL — AB
Leukocytes,Ua: NEGATIVE
Nitrite: NEGATIVE
Protein, ur: >=300 mg/dL — AB
Specific Gravity, Urine: 1.016 (ref 1.005–1.030)
pH: 5 (ref 5.0–8.0)

## 2019-05-24 LAB — CBC WITH DIFFERENTIAL/PLATELET
Abs Immature Granulocytes: 0.02 10*3/uL (ref 0.00–0.07)
Basophils Absolute: 0 10*3/uL (ref 0.0–0.1)
Basophils Relative: 0 %
Eosinophils Absolute: 0.1 10*3/uL (ref 0.0–0.5)
Eosinophils Relative: 2 %
HCT: 32.8 % — ABNORMAL LOW (ref 39.0–52.0)
Hemoglobin: 10.7 g/dL — ABNORMAL LOW (ref 13.0–17.0)
Immature Granulocytes: 0 %
Lymphocytes Relative: 18 %
Lymphs Abs: 1.3 10*3/uL (ref 0.7–4.0)
MCH: 28.6 pg (ref 26.0–34.0)
MCHC: 32.6 g/dL (ref 30.0–36.0)
MCV: 87.7 fL (ref 80.0–100.0)
Monocytes Absolute: 0.8 10*3/uL (ref 0.1–1.0)
Monocytes Relative: 11 %
Neutro Abs: 5.3 10*3/uL (ref 1.7–7.7)
Neutrophils Relative %: 69 %
Platelets: 216 10*3/uL (ref 150–400)
RBC: 3.74 MIL/uL — ABNORMAL LOW (ref 4.22–5.81)
RDW: 12.3 % (ref 11.5–15.5)
WBC: 7.6 10*3/uL (ref 4.0–10.5)
nRBC: 0 % (ref 0.0–0.2)

## 2019-05-24 LAB — COMPREHENSIVE METABOLIC PANEL
ALT: 23 U/L (ref 0–44)
AST: 18 U/L (ref 15–41)
Albumin: 3.2 g/dL — ABNORMAL LOW (ref 3.5–5.0)
Alkaline Phosphatase: 81 U/L (ref 38–126)
Anion gap: 14 (ref 5–15)
BUN: 32 mg/dL — ABNORMAL HIGH (ref 6–20)
CO2: 26 mmol/L (ref 22–32)
Calcium: 8.9 mg/dL (ref 8.9–10.3)
Chloride: 97 mmol/L — ABNORMAL LOW (ref 98–111)
Creatinine, Ser: 3.68 mg/dL — ABNORMAL HIGH (ref 0.61–1.24)
GFR calc Af Amer: 20 mL/min — ABNORMAL LOW (ref >60)
GFR calc non Af Amer: 18 mL/min — ABNORMAL LOW (ref >60)
Glucose, Bld: 356 mg/dL — ABNORMAL HIGH (ref 70–99)
Potassium: 4.6 mmol/L (ref 3.5–5.1)
Sodium: 137 mmol/L (ref 135–145)
Total Bilirubin: 1.2 mg/dL (ref 0.3–1.2)
Total Protein: 6.6 g/dL (ref 6.5–8.1)

## 2019-05-24 LAB — POC OCCULT BLOOD, ED: Fecal Occult Bld: NEGATIVE

## 2019-05-24 LAB — CBG MONITORING, ED: Glucose-Capillary: 320 mg/dL — ABNORMAL HIGH (ref 70–99)

## 2019-05-24 MED ORDER — AMLODIPINE BESYLATE 5 MG PO TABS
10.0000 mg | ORAL_TABLET | Freq: Once | ORAL | Status: AC
  Administered 2019-05-24: 10 mg via ORAL
  Filled 2019-05-24: qty 2

## 2019-05-24 MED ORDER — SODIUM CHLORIDE 0.9 % IV BOLUS
1000.0000 mL | Freq: Once | INTRAVENOUS | Status: AC
  Administered 2019-05-24: 1000 mL via INTRAVENOUS

## 2019-05-24 MED ORDER — LOSARTAN POTASSIUM 50 MG PO TABS
100.0000 mg | ORAL_TABLET | ORAL | Status: AC
  Administered 2019-05-24: 100 mg via ORAL
  Filled 2019-05-24: qty 2

## 2019-05-24 NOTE — Discharge Instructions (Addendum)
Your testing shows that you have some dehydration Your kidneys have been damaged over time, in part related to your diabetes and in part related to your blood pressure Your other tests are not any different than previous but you do need to see a kidney doctor so have your doctor set you up with a kidney specialist in the next month.   Please continue to take your diabetic medications as prescribed Drink plenty of fluids to keep hydrated  ER for any severe or worsening symptoms.

## 2019-05-24 NOTE — ED Provider Notes (Signed)
[facility] EMERGENCY DEPARTMENT Provider Note   CSN: [id number] Arrival date & time: 05/24/19  1934     History   Chief Complaint Chief Complaint  Patient presents with  . Weakness    HPI Louis Peters is a 53 y.o. male.     HPI  The patient is a 53 year old male, he has a known history of "brittle diabetes", he has had a difficult week that started out with nausea and vomiting feeling dehydrated and over the last 5 days has felt persistently generally fatigued without focal findings.  He denies any headache, sore throat, chest pain, cough, shortness of breath, abdominal pain or diarrhea and the nausea and vomiting has resolved after the first 48 hours.  No one else around him is sick.  His blood sugar has been fluctuating as low as 50 and as high as 350 or thereabouts but states this is normal for him and this is why he is on disability for many years.  His family member in the room with him states that he has had some intermittent slurred speech with the patient disagrees with and does not have at this time.  Feels like he might be dehydrated.  No dysuria, no dark urine, no blood in the stool.  Past Medical History:  Diagnosis Date  . Benign hypertension with chronic kidney disease, stage III ([place]) 03/10/2019  . Chronic kidney disease (CKD), stage III (moderate) ([place]) 03/10/2019  . Diabetes mellitus type 1, uncomplicated ([place]) 10/05/4816  . Diabetes mellitus without complication ([place])   . Diabetic retinopathy ([place])    PDR OU  . Heat Injury 03/10/2019  . Hypercholesterolemia 03/10/2019  . Hypertension   . Retinal detachment    TRD OD    Patient Active Problem List   Diagnosis Date Noted  . AKI (acute kidney injury) ([place]) 03/10/2019  . Heat Injury 03/10/2019  . Chronic kidney disease (CKD), stage III (moderate) ([place]) 03/10/2019  . Benign hypertension with chronic kidney disease, stage III ([place]) 03/10/2019  . Hypercholesterolemia 03/10/2019  . Acute kidney  injury superimposed on chronic kidney disease ([place])   . Diabetes mellitus type 1, uncomplicated ([place]) 56/31/4970    Past Surgical History:  Procedure Laterality Date  . CATARACT EXTRACTION    . EYE SURGERY    . RETINAL DETACHMENT SURGERY          Home Medications    Prior to Admission medications   Medication Sig Start Date End Date Taking? Authorizing Provider  insulin degludec (TRESIBA) 100 UNIT/ML SOPN FlexTouch Pen Inject 18 Units into the skin at bedtime. 04/30/19  Yes [provider]  amLODipine (NORVASC) 10 MG tablet Take 10 mg by mouth daily.  01/16/17   [provider]  ammonium lactate (LAC-HYDRIN) 12 % lotion Apply topically. 10/01/18 10/01/19  [provider]  Blood Glucose Monitoring Suppl ([place]) w/Device KIT by Does not apply route. 07/31/18   [provider]  glucagon 1 MG injection Inject 1 mg into the skin once as needed (low Glucose).  07/31/18   [provider]  glucose blood (ONETOUCH VERIO) test strip Use to check blood sugar 8 time(s) daily. 07/31/18 07/31/19  [provider]  HUMALOG KWIKPEN 100 UNIT/ML KiwkPen Inject 5-8 Units into the skin 3 (three) times daily. Take 5 units at breakfast and lunch and Take 8 units at dinner. 02/08/17   [provider]  Insulin Glargine (LANTUS) 100 UNIT/ML Solostar Pen Inject 40 Units into the skin  daily at 10 pm.  04/05/17   [provider]  Insulin Pen Needle (FIFTY50 PEN NEEDLES) 32G X 4 MM MISC Use with lantus and humalog 4 imes per day 07/26/17   [provider]  losartan (COZAAR) 100 MG tablet Take 100 mg by mouth daily.  08/12/18   [provider]  montelukast (SINGULAIR) 10 MG tablet Take 10 mg by mouth at bedtime.    [provider]  ONE TOUCH LANCETS MISC Use to check blood sugar 8 time(s) daily 07/31/18   [provider]  silver sulfADIAZINE (SILVADENE) 1 % cream Apply 1 application topically  daily.  08/03/18   [provider]    Family History Family History  Problem Relation Age of Onset  . Diabetes Maternal Grandmother   . Diabetes Sister     Social History Social History   Tobacco Use  . Smoking status: Never Smoker  . Smokeless tobacco: Never Used  Substance Use Topics  . Alcohol use: Yes    Comment: occ  . Drug use: No     Allergies   Fexofenadine and Latex   Review of Systems Review of Systems  All other systems reviewed and are negative.    Physical Exam Updated Vital Signs BP (!) 196/86   Pulse 86   Temp 98.9 F (37.2 C) (Oral)   Resp 13   SpO2 98%   Physical Exam Vitals signs and nursing note reviewed.  Constitutional:      General: He is not in acute distress.    Appearance: He is well-developed.  HENT:     Head: Normocephalic and atraumatic.     Mouth/Throat:     Mouth: Mucous membranes are dry.     Pharynx: No oropharyngeal exudate.  Eyes:     General: No scleral icterus.       Right eye: No discharge.        Left eye: No discharge.     Conjunctiva/sclera: Conjunctivae normal.     Pupils: Pupils are equal, round, and reactive to light.  Neck:     Musculoskeletal: Normal range of motion and neck supple.     Thyroid: No thyromegaly.     Vascular: No JVD.  Cardiovascular:     Rate and Rhythm: Normal rate and regular rhythm.     Heart sounds: Normal heart sounds. No murmur. No friction rub. No gallop.   Pulmonary:     Effort: Pulmonary effort is normal. No respiratory distress.     Breath sounds: Normal breath sounds. No wheezing or rales.  Abdominal:     General: Bowel sounds are normal. There is no distension.     Palpations: Abdomen is soft. There is no mass.     Tenderness: There is no abdominal tenderness.  Musculoskeletal: Normal range of motion.        General: No tenderness.  Lymphadenopathy:     Cervical: No cervical adenopathy.  Skin:    General: Skin is warm and dry.     Findings: No erythema or  rash.  Neurological:     Mental Status: He is alert.     Coordination: Coordination normal.  Psychiatric:        Behavior: Behavior normal.      ED Treatments / Results  Labs (all labs ordered are listed, but only abnormal results are displayed) Labs Reviewed  URINALYSIS, ROUTINE W REFLEX MICROSCOPIC - Abnormal; Notable for the following components:      Result Value   APPearance HAZY (*)  Glucose, UA >=500 (*)    Hgb urine dipstick SMALL (*)    Ketones, ur 5 (*)    Protein, ur >=300 (*)    All other components within normal limits  COMPREHENSIVE METABOLIC PANEL - Abnormal; Notable for the following components:   Chloride 97 (*)    Glucose, Bld 356 (*)    BUN 32 (*)    Creatinine, Ser 3.68 (*)    Albumin 3.2 (*)    GFR calc non Af Amer 18 (*)    GFR calc Af Amer 20 (*)    All other components within normal limits  CBC WITH DIFFERENTIAL/PLATELET - Abnormal; Notable for the following components:   RBC 3.74 (*)    Hemoglobin 10.7 (*)    HCT 32.8 (*)    All other components within normal limits  CBG MONITORING, ED - Abnormal; Notable for the following components:   Glucose-Capillary 320 (*)    All other components within normal limits  OCCULT BLOOD X 1 CARD TO LAB, STOOL  POC OCCULT BLOOD, ED    EKG None  Radiology No results found.  Procedures Procedures (including critical care time)  Medications Ordered in ED Medications  sodium chloride 0.9 % bolus 1,000 mL (0 mLs Intravenous Stopped 05/24/19 2332)  losartan (COZAAR) tablet 100 mg (100 mg Oral Given 05/24/19 2332)  amLODipine (NORVASC) tablet 10 mg (10 mg Oral Given 05/24/19 2332)     Initial Impression / Assessment and Plan / ED Course  I have reviewed the triage vital signs and the nursing notes.  Pertinent labs & imaging results that were available during my care of the patient were reviewed by me and considered in my medical decision making (see chart for details).  Clinical Course as of May 24 2331   Sat May 24, 2019  2108 I have reviewed the patient's prior lab work, the blood work today compared to that shows essentially unchanged creatinine.  This change in creatinine that occurred 2 months ago has essentially not improved that much.  His BUN is reasonable, the fact that he is not having any difficulty eating or drinking at this time is reassuring.  Urinalysis pending.  Blood counts show no significant leukocytosis   [BM]  2109 There is a mild anemia compared to prior labs   [BM]  2318 Blood pressure elevated - but manageable with medicines - no fevers, no tachycardia, hyperglycemia, but no gap acidosis - he is hemoccult negative, Cr is close to new baseline over last 2 months - fluids given, pt feels better - UA pending - but anticipate d/c.   [BM]    Clinical Course User Index [BM] Noemi Chapel, MD       Despite the patient having the feeling of fatigue he is no longer nausea and vomiting, he does have an admission for acute kidney injury a couple of months ago however this time the patient is well-appearing, he has no findings on his exam of concern except for a dry mouth.  His abdomen is nontender, the lungs are clear and the heart is regular without tachycardia.  He has no focal neurologic deficits, he has normal strength in all 4 extremities with normal speech, normal facial symmetry and normal memory.  Will check labs, give some fluids, the patient does appear very stable otherwise and this does not appear consistent with a stroke or other acute coronary syndrome, acute vascular process, or infection.  UA negative.  Final Clinical Impressions(s) / ED Diagnoses  Final diagnoses:  Essential hypertension  Hyperglycemia  Chronic renal failure, unspecified CKD stage  Dehydration    ED Discharge Orders    None       Noemi Chapel, MD 05/24/19 2333

## 2019-05-24 NOTE — ED Notes (Signed)
Nurse starting IV and will collect labs. 

## 2019-05-24 NOTE — ED Triage Notes (Signed)
Pt c/o generalized weakness since Monday. Reports nausea/vomiting on Monday/Tuesday, has since resolved. Denies abdominal pain/chest pain/shortness of breath/fevers. Hx diabetes, states his blood sugars have been "all over the place".

## 2019-07-30 DIAGNOSIS — Z89429 Acquired absence of other toe(s), unspecified side: Secondary | ICD-10-CM | POA: Insufficient documentation

## 2019-08-26 ENCOUNTER — Other Ambulatory Visit: Payer: Self-pay

## 2019-08-26 DIAGNOSIS — Z20822 Contact with and (suspected) exposure to covid-19: Secondary | ICD-10-CM

## 2019-08-28 LAB — NOVEL CORONAVIRUS, NAA: SARS-CoV-2, NAA: NOT DETECTED

## 2019-10-30 ENCOUNTER — Other Ambulatory Visit: Payer: Self-pay | Admitting: Nephrology

## 2019-10-30 DIAGNOSIS — N184 Chronic kidney disease, stage 4 (severe): Secondary | ICD-10-CM

## 2019-10-31 ENCOUNTER — Ambulatory Visit
Admission: RE | Admit: 2019-10-31 | Discharge: 2019-10-31 | Disposition: A | Discharge status: Home / Self Care | Payer: Medicare Other | Source: Ambulatory Visit | Attending: Nephrology | Admitting: Nephrology

## 2019-10-31 DIAGNOSIS — N184 Chronic kidney disease, stage 4 (severe): Secondary | ICD-10-CM

## 2020-02-16 ENCOUNTER — Other Ambulatory Visit (HOSPITAL_COMMUNITY): Payer: Self-pay | Admitting: *Deleted

## 2020-02-17 ENCOUNTER — Other Ambulatory Visit: Payer: Self-pay

## 2020-02-17 ENCOUNTER — Ambulatory Visit (HOSPITAL_COMMUNITY)
Admission: RE | Admit: 2020-02-17 | Discharge: 2020-02-17 | Disposition: A | Discharge status: Home / Self Care | Payer: Medicare Other | Source: Ambulatory Visit | Attending: Nephrology | Admitting: Nephrology

## 2020-02-17 VITALS — BP 177/82 | HR 70 | Temp 97.2°F | Resp 18

## 2020-02-17 DIAGNOSIS — N1832 Chronic kidney disease, stage 3b: Secondary | ICD-10-CM

## 2020-02-17 LAB — POCT HEMOGLOBIN-HEMACUE: Hemoglobin: 11.4 g/dL — ABNORMAL LOW (ref 13.0–17.0)

## 2020-02-17 MED ORDER — EPOETIN ALFA-EPBX 10000 UNIT/ML IJ SOLN
10000.0000 [IU] | INTRAMUSCULAR | Status: DC
  Administered 2020-02-17: 10000 [IU] via SUBCUTANEOUS

## 2020-02-17 MED ORDER — EPOETIN ALFA-EPBX 10000 UNIT/ML IJ SOLN
INTRAMUSCULAR | Status: AC
  Filled 2020-02-17: qty 1

## 2020-02-17 MED ORDER — SODIUM CHLORIDE 0.9 % IV SOLN
510.0000 mg | INTRAVENOUS | Status: DC
  Administered 2020-02-17: 510 mg via INTRAVENOUS
  Filled 2020-02-17: qty 17

## 2020-02-17 NOTE — Discharge Instructions (Signed)
Ferumoxytol injection What is this medicine? FERUMOXYTOL is an iron complex. Iron is used to make healthy red blood cells, which carry oxygen and nutrients throughout the body. This medicine is used to treat iron deficiency anemia. This medicine may be used for other purposes; ask your health care provider or pharmacist if you have questions. COMMON BRAND NAME(S): Feraheme What should I tell my health care provider before I take this medicine? They need to know if you have any of these conditions:  anemia not caused by low iron levels  high levels of iron in the blood  magnetic resonance imaging (MRI) test scheduled  an unusual or allergic reaction to iron, other medicines, foods, dyes, or preservatives  pregnant or trying to get pregnant  breast-feeding How should I use this medicine? This medicine is for injection into a vein. It is given by a health care professional in a hospital or clinic setting. Talk to your pediatrician regarding the use of this medicine in children. Special care may be needed. Overdosage: If you think you have taken too much of this medicine contact a poison control center or emergency room at once. NOTE: This medicine is only for you. Do not share this medicine with others. What if I miss a dose? It is important not to miss your dose. Call your doctor or health care professional if you are unable to keep an appointment. What may interact with this medicine? This medicine may interact with the following medications:  other iron products This list may not describe all possible interactions. Give your health care provider a list of all the medicines, herbs, non-prescription drugs, or dietary supplements you use. Also tell them if you smoke, drink alcohol, or use illegal drugs. Some items may interact with your medicine. What should I watch for while using this medicine? Visit your doctor or healthcare professional regularly. Tell your doctor or healthcare  professional if your symptoms do not start to get better or if they get worse. You may need blood work done while you are taking this medicine. You may need to follow a special diet. Talk to your doctor. Foods that contain iron include: whole grains/cereals, dried fruits, beans, or peas, leafy green vegetables, and organ meats (liver, kidney). What side effects may I notice from receiving this medicine? Side effects that you should report to your doctor or health care professional as soon as possible:  allergic reactions like skin rash, itching or hives, swelling of the face, lips, or tongue  breathing problems  changes in blood pressure  feeling faint or lightheaded, falls  fever or chills  flushing, sweating, or hot feelings  swelling of the ankles or feet Side effects that usually do not require medical attention (report to your doctor or health care professional if they continue or are bothersome):  diarrhea  headache  nausea, vomiting  stomach pain This list may not describe all possible side effects. Call your doctor for medical advice about side effects. You may report side effects to FDA at 1-800-FDA-1088. Where should I keep my medicine? This drug is given in a hospital or clinic and will not be stored at home. NOTE: This sheet is a summary. It may not cover all possible information. If you have questions about this medicine, talk to your doctor, pharmacist, or health care provider.  2020 Elsevier/Gold Standard (2016-11-06 20:21:10) Epoetin Alfa injection What is this medicine? EPOETIN ALFA (e POE e tin AL fa) helps your body make more red blood cells. This  medicine is used to treat anemia caused by chronic kidney disease, cancer chemotherapy, or HIV-therapy. It may also be used before surgery if you have anemia. This medicine may be used for other purposes; ask your health care provider or pharmacist if you have questions. COMMON BRAND NAME(S): Epogen, Procrit,  Retacrit What should I tell my health care provider before I take this medicine? They need to know if you have any of these conditions:  cancer  heart disease  high blood pressure  history of blood clots  history of stroke  low levels of folate, iron, or vitamin B12 in the blood  seizures  an unusual or allergic reaction to erythropoietin, albumin, benzyl alcohol, hamster proteins, other medicines, foods, dyes, or preservatives  pregnant or trying to get pregnant  breast-feeding How should I use this medicine? This medicine is for injection into a vein or under the skin. It is usually given by a health care professional in a hospital or clinic setting. If you get this medicine at home, you will be taught how to prepare and give this medicine. Use exactly as directed. Take your medicine at regular intervals. Do not take your medicine more often than directed. It is important that you put your used needles and syringes in a special sharps container. Do not put them in a trash can. If you do not have a sharps container, call your pharmacist or healthcare provider to get one. A special MedGuide will be given to you by the pharmacist with each prescription and refill. Be sure to read this information carefully each time. Talk to your pediatrician regarding the use of this medicine in children. While this drug may be prescribed for selected conditions, precautions do apply. Overdosage: If you think you have taken too much of this medicine contact a poison control center or emergency room at once. NOTE: This medicine is only for you. Do not share this medicine with others. What if I miss a dose? If you miss a dose, take it as soon as you can. If it is almost time for your next dose, take only that dose. Do not take double or extra doses. What may interact with this medicine? Interactions have not been studied. This list may not describe all possible interactions. Give your health care  provider a list of all the medicines, herbs, non-prescription drugs, or dietary supplements you use. Also tell them if you smoke, drink alcohol, or use illegal drugs. Some items may interact with your medicine. What should I watch for while using this medicine? Your condition will be monitored carefully while you are receiving this medicine. You may need blood work done while you are taking this medicine. This medicine may cause a decrease in vitamin B6. You should make sure that you get enough vitamin B6 while you are taking this medicine. Discuss the foods you eat and the vitamins you take with your health care professional. What side effects may I notice from receiving this medicine? Side effects that you should report to your doctor or health care professional as soon as possible:  allergic reactions like skin rash, itching or hives, swelling of the face, lips, or tongue  seizures  signs and symptoms of a blood clot such as breathing problems; changes in vision; chest pain; severe, sudden headache; pain, swelling, warmth in the leg; trouble speaking; sudden numbness or weakness of the face, arm or leg  signs and symptoms of a stroke like changes in vision; confusion; trouble speaking or understanding;   severe headaches; sudden numbness or weakness of the face, arm or leg; trouble walking; dizziness; loss of balance or coordination Side effects that usually do not require medical attention (report to your doctor or health care professional if they continue or are bothersome):  chills  cough  dizziness  fever  headaches  joint pain  muscle cramps  muscle pain  nausea, vomiting  pain, redness, or irritation at site where injected This list may not describe all possible side effects. Call your doctor for medical advice about side effects. You may report side effects to FDA at 1-800-FDA-1088. Where should I keep my medicine? Keep out of the reach of children. Store in a  refrigerator between 2 and 8 degrees C (36 and 46 degrees F). Do not freeze or shake. Throw away any unused portion if using a single-dose vial. Multi-dose vials can be kept in the refrigerator for up to 21 days after the initial dose. Throw away unused medicine. NOTE: This sheet is a summary. It may not cover all possible information. If you have questions about this medicine, talk to your doctor, pharmacist, or health care provider.  2020 Elsevier/Gold Standard (2017-04-27 08:35:19)

## 2020-02-18 ENCOUNTER — Encounter (HOSPITAL_COMMUNITY): Payer: Medicare Other

## 2020-03-02 ENCOUNTER — Encounter (HOSPITAL_COMMUNITY)
Admission: RE | Admit: 2020-03-02 | Discharge: 2020-03-02 | Disposition: A | Discharge status: Home / Self Care | Payer: Medicare Other | Source: Ambulatory Visit | Attending: Nephrology | Admitting: Nephrology

## 2020-03-02 VITALS — BP 158/76 | HR 82 | Temp 97.4°F | Resp 18

## 2020-03-02 DIAGNOSIS — N184 Chronic kidney disease, stage 4 (severe): Secondary | ICD-10-CM | POA: Diagnosis present

## 2020-03-02 DIAGNOSIS — D631 Anemia in chronic kidney disease: Secondary | ICD-10-CM | POA: Insufficient documentation

## 2020-03-02 DIAGNOSIS — N1832 Chronic kidney disease, stage 3b: Secondary | ICD-10-CM

## 2020-03-02 LAB — POCT HEMOGLOBIN-HEMACUE: Hemoglobin: 11.9 g/dL — ABNORMAL LOW (ref 13.0–17.0)

## 2020-03-02 MED ORDER — EPOETIN ALFA-EPBX 10000 UNIT/ML IJ SOLN
10000.0000 [IU] | INTRAMUSCULAR | Status: DC
  Administered 2020-03-02: 10000 [IU] via SUBCUTANEOUS

## 2020-03-02 MED ORDER — SODIUM CHLORIDE 0.9 % IV SOLN
510.0000 mg | INTRAVENOUS | Status: DC
  Administered 2020-03-02: 510 mg via INTRAVENOUS
  Filled 2020-03-02: qty 17

## 2020-03-02 MED ORDER — EPOETIN ALFA-EPBX 10000 UNIT/ML IJ SOLN
INTRAMUSCULAR | Status: AC
  Filled 2020-03-02: qty 1

## 2020-03-16 ENCOUNTER — Encounter (HOSPITAL_COMMUNITY)
Admission: RE | Admit: 2020-03-16 | Discharge: 2020-03-16 | Disposition: A | Discharge status: Home / Self Care | Payer: Medicare Other | Source: Ambulatory Visit | Attending: Nephrology | Admitting: Nephrology

## 2020-03-16 ENCOUNTER — Other Ambulatory Visit: Payer: Self-pay

## 2020-03-16 VITALS — BP 178/103 | HR 68 | Resp 18

## 2020-03-16 DIAGNOSIS — N184 Chronic kidney disease, stage 4 (severe): Secondary | ICD-10-CM | POA: Diagnosis not present

## 2020-03-16 DIAGNOSIS — N1832 Chronic kidney disease, stage 3b: Secondary | ICD-10-CM

## 2020-03-16 LAB — IRON AND TIBC
Iron: 87 ug/dL (ref 45–182)
Saturation Ratios: 40 % — ABNORMAL HIGH (ref 17.9–39.5)
TIBC: 217 ug/dL — ABNORMAL LOW (ref 250–450)
UIBC: 130 ug/dL

## 2020-03-16 LAB — POCT HEMOGLOBIN-HEMACUE: Hemoglobin: 12.5 g/dL — ABNORMAL LOW (ref 13.0–17.0)

## 2020-03-16 LAB — FERRITIN: Ferritin: 623 ng/mL — ABNORMAL HIGH (ref 24–336)

## 2020-03-16 MED ORDER — EPOETIN ALFA-EPBX 10000 UNIT/ML IJ SOLN: 10000.0000 [IU] | INTRAMUSCULAR | Status: DC

## 2020-03-30 ENCOUNTER — Other Ambulatory Visit: Payer: Self-pay

## 2020-03-30 ENCOUNTER — Encounter (HOSPITAL_COMMUNITY)
Admission: RE | Admit: 2020-03-30 | Discharge: 2020-03-30 | Disposition: A | Discharge status: Home / Self Care | Payer: Medicare Other | Source: Ambulatory Visit | Attending: Nephrology | Admitting: Nephrology

## 2020-03-30 VITALS — BP 178/77 | HR 78 | Temp 97.7°F

## 2020-03-30 DIAGNOSIS — N1832 Chronic kidney disease, stage 3b: Secondary | ICD-10-CM

## 2020-03-30 DIAGNOSIS — N184 Chronic kidney disease, stage 4 (severe): Secondary | ICD-10-CM | POA: Diagnosis not present

## 2020-03-30 LAB — POCT HEMOGLOBIN-HEMACUE: Hemoglobin: 11.9 g/dL — ABNORMAL LOW (ref 13.0–17.0)

## 2020-03-30 MED ORDER — EPOETIN ALFA-EPBX 10000 UNIT/ML IJ SOLN
10000.0000 [IU] | INTRAMUSCULAR | Status: DC
  Administered 2020-03-30: 10000 [IU] via SUBCUTANEOUS

## 2020-03-30 MED ORDER — EPOETIN ALFA-EPBX 10000 UNIT/ML IJ SOLN
INTRAMUSCULAR | Status: AC
  Filled 2020-03-30: qty 1

## 2020-04-13 ENCOUNTER — Ambulatory Visit (HOSPITAL_COMMUNITY)
Admission: RE | Admit: 2020-04-13 | Discharge: 2020-04-13 | Disposition: A | Discharge status: Home / Self Care | Payer: Medicare Other | Source: Ambulatory Visit | Attending: Nephrology | Admitting: Nephrology

## 2020-04-13 ENCOUNTER — Other Ambulatory Visit: Payer: Self-pay

## 2020-04-13 VITALS — BP 133/75 | HR 89 | Temp 97.4°F | Resp 18

## 2020-04-13 DIAGNOSIS — D631 Anemia in chronic kidney disease: Secondary | ICD-10-CM | POA: Insufficient documentation

## 2020-04-13 DIAGNOSIS — N1832 Chronic kidney disease, stage 3b: Secondary | ICD-10-CM | POA: Insufficient documentation

## 2020-04-13 LAB — IRON AND TIBC
Iron: 78 ug/dL (ref 45–182)
Saturation Ratios: 37 % (ref 17.9–39.5)
TIBC: 211 ug/dL — ABNORMAL LOW (ref 250–450)
UIBC: 133 ug/dL

## 2020-04-13 LAB — FERRITIN: Ferritin: 346 ng/mL — ABNORMAL HIGH (ref 24–336)

## 2020-04-13 LAB — POCT HEMOGLOBIN-HEMACUE: Hemoglobin: 13.6 g/dL (ref 13.0–17.0)

## 2020-04-13 MED ORDER — EPOETIN ALFA-EPBX 10000 UNIT/ML IJ SOLN: 10000.0000 [IU] | INTRAMUSCULAR | Status: DC

## 2020-04-27 ENCOUNTER — Ambulatory Visit (HOSPITAL_COMMUNITY)
Admission: RE | Admit: 2020-04-27 | Discharge: 2020-04-27 | Disposition: A | Discharge status: Home / Self Care | Payer: Medicare Other | Source: Ambulatory Visit | Attending: Nephrology | Admitting: Nephrology

## 2020-04-27 ENCOUNTER — Other Ambulatory Visit: Payer: Self-pay

## 2020-04-27 VITALS — BP 176/63 | HR 70 | Temp 97.8°F | Resp 18

## 2020-04-27 DIAGNOSIS — N1832 Chronic kidney disease, stage 3b: Secondary | ICD-10-CM | POA: Diagnosis present

## 2020-04-27 LAB — POCT HEMOGLOBIN-HEMACUE: Hemoglobin: 12.8 g/dL — ABNORMAL LOW (ref 13.0–17.0)

## 2020-04-27 MED ORDER — EPOETIN ALFA-EPBX 10000 UNIT/ML IJ SOLN
INTRAMUSCULAR | Status: AC
  Filled 2020-04-27: qty 1

## 2020-04-27 MED ORDER — EPOETIN ALFA-EPBX 10000 UNIT/ML IJ SOLN: 10000.0000 [IU] | INTRAMUSCULAR | Status: DC

## 2020-05-11 ENCOUNTER — Encounter (HOSPITAL_COMMUNITY)
Admission: RE | Admit: 2020-05-11 | Discharge: 2020-05-11 | Disposition: A | Discharge status: Home / Self Care | Payer: Medicare Other | Source: Ambulatory Visit | Attending: Nephrology | Admitting: Nephrology

## 2020-05-11 ENCOUNTER — Other Ambulatory Visit: Payer: Self-pay

## 2020-05-11 VITALS — BP 142/65 | HR 73 | Temp 97.3°F | Resp 18

## 2020-05-11 DIAGNOSIS — N184 Chronic kidney disease, stage 4 (severe): Secondary | ICD-10-CM | POA: Diagnosis present

## 2020-05-11 DIAGNOSIS — D631 Anemia in chronic kidney disease: Secondary | ICD-10-CM | POA: Diagnosis not present

## 2020-05-11 DIAGNOSIS — N1832 Chronic kidney disease, stage 3b: Secondary | ICD-10-CM | POA: Diagnosis present

## 2020-05-11 LAB — IRON AND TIBC
Iron: 109 ug/dL (ref 45–182)
Saturation Ratios: 54 % — ABNORMAL HIGH (ref 17.9–39.5)
TIBC: 203 ug/dL — ABNORMAL LOW (ref 250–450)
UIBC: 94 ug/dL

## 2020-05-11 LAB — FERRITIN: Ferritin: 391 ng/mL — ABNORMAL HIGH (ref 24–336)

## 2020-05-11 LAB — POCT HEMOGLOBIN-HEMACUE: Hemoglobin: 12.5 g/dL — ABNORMAL LOW (ref 13.0–17.0)

## 2020-05-11 MED ORDER — EPOETIN ALFA-EPBX 10000 UNIT/ML IJ SOLN: 10000.0000 [IU] | INTRAMUSCULAR | Status: DC

## 2020-05-25 ENCOUNTER — Other Ambulatory Visit: Payer: Self-pay

## 2020-05-25 ENCOUNTER — Encounter (HOSPITAL_COMMUNITY)
Admission: RE | Admit: 2020-05-25 | Discharge: 2020-05-25 | Disposition: A | Discharge status: Home / Self Care | Payer: Medicare Other | Source: Ambulatory Visit | Attending: Nephrology | Admitting: Nephrology

## 2020-05-25 VITALS — BP 151/64 | HR 82 | Temp 98.0°F | Resp 18

## 2020-05-25 DIAGNOSIS — N1832 Chronic kidney disease, stage 3b: Secondary | ICD-10-CM | POA: Diagnosis present

## 2020-05-25 LAB — POCT HEMOGLOBIN-HEMACUE: Hemoglobin: 11.3 g/dL — ABNORMAL LOW (ref 13.0–17.0)

## 2020-05-25 MED ORDER — EPOETIN ALFA-EPBX 10000 UNIT/ML IJ SOLN
INTRAMUSCULAR | Status: AC
  Filled 2020-05-25: qty 1

## 2020-05-25 MED ORDER — EPOETIN ALFA-EPBX 10000 UNIT/ML IJ SOLN
10000.0000 [IU] | INTRAMUSCULAR | Status: DC
  Administered 2020-05-25: 10000 [IU] via SUBCUTANEOUS

## 2020-06-08 ENCOUNTER — Other Ambulatory Visit: Payer: Self-pay

## 2020-06-08 ENCOUNTER — Encounter (HOSPITAL_COMMUNITY)
Admission: RE | Admit: 2020-06-08 | Discharge: 2020-06-08 | Disposition: A | Discharge status: Home / Self Care | Payer: Medicare Other | Source: Ambulatory Visit | Attending: Nephrology | Admitting: Nephrology

## 2020-06-08 VITALS — BP 168/80 | HR 72 | Temp 97.9°F | Resp 18

## 2020-06-08 DIAGNOSIS — D631 Anemia in chronic kidney disease: Secondary | ICD-10-CM | POA: Diagnosis not present

## 2020-06-08 DIAGNOSIS — N184 Chronic kidney disease, stage 4 (severe): Secondary | ICD-10-CM | POA: Insufficient documentation

## 2020-06-08 DIAGNOSIS — N1832 Chronic kidney disease, stage 3b: Secondary | ICD-10-CM

## 2020-06-08 LAB — POCT HEMOGLOBIN-HEMACUE: Hemoglobin: 11.7 g/dL — ABNORMAL LOW (ref 13.0–17.0)

## 2020-06-08 LAB — IRON AND TIBC
Iron: 91 ug/dL (ref 45–182)
Saturation Ratios: 41 % — ABNORMAL HIGH (ref 17.9–39.5)
TIBC: 221 ug/dL — ABNORMAL LOW (ref 250–450)
UIBC: 130 ug/dL

## 2020-06-08 LAB — FERRITIN: Ferritin: 368 ng/mL — ABNORMAL HIGH (ref 24–336)

## 2020-06-08 MED ORDER — EPOETIN ALFA-EPBX 10000 UNIT/ML IJ SOLN
10000.0000 [IU] | INTRAMUSCULAR | Status: DC
  Administered 2020-06-08: 10000 [IU] via SUBCUTANEOUS

## 2020-06-08 MED ORDER — EPOETIN ALFA-EPBX 10000 UNIT/ML IJ SOLN
INTRAMUSCULAR | Status: AC
  Filled 2020-06-08: qty 1

## 2020-06-22 ENCOUNTER — Other Ambulatory Visit: Payer: Self-pay

## 2020-06-22 ENCOUNTER — Encounter (HOSPITAL_COMMUNITY)
Admission: RE | Admit: 2020-06-22 | Discharge: 2020-06-22 | Disposition: A | Discharge status: Home / Self Care | Payer: Medicare Other | Source: Ambulatory Visit | Attending: Nephrology | Admitting: Nephrology

## 2020-06-22 VITALS — BP 160/70 | HR 82 | Resp 18

## 2020-06-22 DIAGNOSIS — N1832 Chronic kidney disease, stage 3b: Secondary | ICD-10-CM

## 2020-06-22 DIAGNOSIS — N184 Chronic kidney disease, stage 4 (severe): Secondary | ICD-10-CM | POA: Diagnosis not present

## 2020-06-22 LAB — POCT HEMOGLOBIN-HEMACUE: Hemoglobin: 12.7 g/dL — ABNORMAL LOW (ref 13.0–17.0)

## 2020-06-22 MED ORDER — EPOETIN ALFA-EPBX 10000 UNIT/ML IJ SOLN: 10000.0000 [IU] | INTRAMUSCULAR | Status: DC

## 2020-07-06 ENCOUNTER — Other Ambulatory Visit: Payer: Self-pay

## 2020-07-06 ENCOUNTER — Encounter (HOSPITAL_COMMUNITY)
Admission: RE | Admit: 2020-07-06 | Discharge: 2020-07-06 | Disposition: A | Discharge status: Home / Self Care | Payer: Medicare Other | Source: Ambulatory Visit | Attending: Nephrology | Admitting: Nephrology

## 2020-07-06 VITALS — BP 171/89 | HR 81 | Temp 97.5°F | Resp 18

## 2020-07-06 DIAGNOSIS — D631 Anemia in chronic kidney disease: Secondary | ICD-10-CM | POA: Insufficient documentation

## 2020-07-06 DIAGNOSIS — N1832 Chronic kidney disease, stage 3b: Secondary | ICD-10-CM | POA: Insufficient documentation

## 2020-07-06 LAB — IRON AND TIBC
Iron: 92 ug/dL (ref 45–182)
Saturation Ratios: 39 % (ref 17.9–39.5)
TIBC: 238 ug/dL — ABNORMAL LOW (ref 250–450)
UIBC: 146 ug/dL

## 2020-07-06 LAB — FERRITIN: Ferritin: 307 ng/mL (ref 24–336)

## 2020-07-06 LAB — POCT HEMOGLOBIN-HEMACUE: Hemoglobin: 12.1 g/dL — ABNORMAL LOW (ref 13.0–17.0)

## 2020-07-06 MED ORDER — EPOETIN ALFA-EPBX 10000 UNIT/ML IJ SOLN: 10000.0000 [IU] | INTRAMUSCULAR | Status: DC

## 2020-07-20 ENCOUNTER — Encounter (HOSPITAL_COMMUNITY)
Admission: RE | Admit: 2020-07-20 | Discharge: 2020-07-20 | Disposition: A | Discharge status: Home / Self Care | Payer: Medicare Other | Source: Ambulatory Visit | Attending: Nephrology | Admitting: Nephrology

## 2020-07-20 ENCOUNTER — Other Ambulatory Visit: Payer: Self-pay

## 2020-07-20 VITALS — BP 157/76 | HR 88 | Temp 97.4°F | Resp 18

## 2020-07-20 DIAGNOSIS — R4781 Slurred speech: Secondary | ICD-10-CM | POA: Diagnosis not present

## 2020-07-20 DIAGNOSIS — I6381 Other cerebral infarction due to occlusion or stenosis of small artery: Secondary | ICD-10-CM | POA: Diagnosis not present

## 2020-07-20 DIAGNOSIS — N1832 Chronic kidney disease, stage 3b: Secondary | ICD-10-CM

## 2020-07-20 LAB — POCT HEMOGLOBIN-HEMACUE: Hemoglobin: 11.9 g/dL — ABNORMAL LOW (ref 13.0–17.0)

## 2020-07-20 MED ORDER — EPOETIN ALFA-EPBX 10000 UNIT/ML IJ SOLN: 10000.0000 [IU] | INTRAMUSCULAR | Status: DC

## 2020-07-20 MED ORDER — EPOETIN ALFA-EPBX 10000 UNIT/ML IJ SOLN
INTRAMUSCULAR | Status: AC
  Administered 2020-07-20: 10000 [IU] via SUBCUTANEOUS
  Filled 2020-07-20: qty 1

## 2020-07-22 ENCOUNTER — Other Ambulatory Visit: Payer: Self-pay

## 2020-07-22 ENCOUNTER — Inpatient Hospital Stay (HOSPITAL_COMMUNITY): Payer: Medicare Other

## 2020-07-22 ENCOUNTER — Emergency Department (HOSPITAL_COMMUNITY): Payer: Medicare Other

## 2020-07-22 ENCOUNTER — Encounter (HOSPITAL_COMMUNITY): Payer: Self-pay | Admitting: Emergency Medicine

## 2020-07-22 ENCOUNTER — Inpatient Hospital Stay (HOSPITAL_COMMUNITY)
Admission: EM | Admit: 2020-07-22 | Discharge: 2020-07-24 | DRG: 065 | Disposition: A | Discharge status: Home / Self Care | Payer: Medicare Other | Attending: Internal Medicine | Admitting: Internal Medicine

## 2020-07-22 DIAGNOSIS — I361 Nonrheumatic tricuspid (valve) insufficiency: Secondary | ICD-10-CM

## 2020-07-22 DIAGNOSIS — E7800 Pure hypercholesterolemia, unspecified: Secondary | ICD-10-CM | POA: Diagnosis present

## 2020-07-22 DIAGNOSIS — Z91040 Latex allergy status: Secondary | ICD-10-CM

## 2020-07-22 DIAGNOSIS — E1040 Type 1 diabetes mellitus with diabetic neuropathy, unspecified: Secondary | ICD-10-CM | POA: Diagnosis not present

## 2020-07-22 DIAGNOSIS — Z9119 Patient's noncompliance with other medical treatment and regimen: Secondary | ICD-10-CM | POA: Diagnosis not present

## 2020-07-22 DIAGNOSIS — I160 Hypertensive urgency: Secondary | ICD-10-CM | POA: Diagnosis present

## 2020-07-22 DIAGNOSIS — I6381 Other cerebral infarction due to occlusion or stenosis of small artery: Principal | ICD-10-CM | POA: Diagnosis present

## 2020-07-22 DIAGNOSIS — E1065 Type 1 diabetes mellitus with hyperglycemia: Secondary | ICD-10-CM | POA: Diagnosis not present

## 2020-07-22 DIAGNOSIS — R471 Dysarthria and anarthria: Secondary | ICD-10-CM | POA: Diagnosis present

## 2020-07-22 DIAGNOSIS — E1022 Type 1 diabetes mellitus with diabetic chronic kidney disease: Secondary | ICD-10-CM | POA: Diagnosis present

## 2020-07-22 DIAGNOSIS — Z888 Allergy status to other drugs, medicaments and biological substances status: Secondary | ICD-10-CM

## 2020-07-22 DIAGNOSIS — I129 Hypertensive chronic kidney disease with stage 1 through stage 4 chronic kidney disease, or unspecified chronic kidney disease: Secondary | ICD-10-CM | POA: Diagnosis present

## 2020-07-22 DIAGNOSIS — Z794 Long term (current) use of insulin: Secondary | ICD-10-CM

## 2020-07-22 DIAGNOSIS — E785 Hyperlipidemia, unspecified: Secondary | ICD-10-CM | POA: Diagnosis present

## 2020-07-22 DIAGNOSIS — R4781 Slurred speech: Secondary | ICD-10-CM | POA: Diagnosis present

## 2020-07-22 DIAGNOSIS — Z9114 Patient's other noncompliance with medication regimen: Secondary | ICD-10-CM | POA: Diagnosis not present

## 2020-07-22 DIAGNOSIS — Z833 Family history of diabetes mellitus: Secondary | ICD-10-CM

## 2020-07-22 DIAGNOSIS — T465X6A Underdosing of other antihypertensive drugs, initial encounter: Secondary | ICD-10-CM | POA: Diagnosis present

## 2020-07-22 DIAGNOSIS — I6389 Other cerebral infarction: Secondary | ICD-10-CM

## 2020-07-22 DIAGNOSIS — Z79899 Other long term (current) drug therapy: Secondary | ICD-10-CM

## 2020-07-22 DIAGNOSIS — I10 Essential (primary) hypertension: Secondary | ICD-10-CM | POA: Insufficient documentation

## 2020-07-22 DIAGNOSIS — E103593 Type 1 diabetes mellitus with proliferative diabetic retinopathy without macular edema, bilateral: Secondary | ICD-10-CM | POA: Diagnosis not present

## 2020-07-22 DIAGNOSIS — N179 Acute kidney failure, unspecified: Secondary | ICD-10-CM | POA: Diagnosis present

## 2020-07-22 DIAGNOSIS — E109 Type 1 diabetes mellitus without complications: Secondary | ICD-10-CM | POA: Diagnosis present

## 2020-07-22 DIAGNOSIS — Z20822 Contact with and (suspected) exposure to covid-19: Secondary | ICD-10-CM | POA: Diagnosis present

## 2020-07-22 DIAGNOSIS — R278 Other lack of coordination: Secondary | ICD-10-CM | POA: Diagnosis present

## 2020-07-22 DIAGNOSIS — N189 Chronic kidney disease, unspecified: Secondary | ICD-10-CM | POA: Diagnosis present

## 2020-07-22 DIAGNOSIS — I639 Cerebral infarction, unspecified: Secondary | ICD-10-CM | POA: Diagnosis not present

## 2020-07-22 DIAGNOSIS — N184 Chronic kidney disease, stage 4 (severe): Secondary | ICD-10-CM | POA: Diagnosis present

## 2020-07-22 LAB — CBC
HCT: 38.4 % — ABNORMAL LOW (ref 39.0–52.0)
HCT: 38.5 % — ABNORMAL LOW (ref 39.0–52.0)
Hemoglobin: 11.9 g/dL — ABNORMAL LOW (ref 13.0–17.0)
Hemoglobin: 12.2 g/dL — ABNORMAL LOW (ref 13.0–17.0)
MCH: 28.5 pg (ref 26.0–34.0)
MCH: 28.8 pg (ref 26.0–34.0)
MCHC: 31 g/dL (ref 30.0–36.0)
MCHC: 31.7 g/dL (ref 30.0–36.0)
MCV: 91.9 fL (ref 80.0–100.0)
MCV: 90.8 fL (ref 80.0–100.0)
Platelets: 205 10*3/uL (ref 150–400)
Platelets: 186 10*3/uL (ref 150–400)
RBC: 4.18 MIL/uL — ABNORMAL LOW (ref 4.22–5.81)
RBC: 4.24 MIL/uL (ref 4.22–5.81)
RDW: 12.9 % (ref 11.5–15.5)
RDW: 13 % (ref 11.5–15.5)
WBC: 7.3 10*3/uL (ref 4.0–10.5)
WBC: 7.6 10*3/uL (ref 4.0–10.5)
nRBC: 0 % (ref 0.0–0.2)
nRBC: 0 % (ref 0.0–0.2)

## 2020-07-22 LAB — URINALYSIS, ROUTINE W REFLEX MICROSCOPIC
Bilirubin Urine: NEGATIVE
Glucose, UA: >=500 mg/dL — AB
Hgb urine dipstick: NEGATIVE
Ketones, ur: NEGATIVE mg/dL
Leukocytes,Ua: NEGATIVE
Nitrite: NEGATIVE
Protein, ur: >=300 mg/dL — AB
Specific Gravity, Urine: 1.011 (ref 1.005–1.030)
pH: 6 (ref 5.0–8.0)

## 2020-07-22 LAB — RAPID URINE DRUG SCREEN, HOSP PERFORMED
Amphetamines: NOT DETECTED
Barbiturates: NOT DETECTED
Benzodiazepines: NOT DETECTED
Cocaine: NOT DETECTED
Opiates: NOT DETECTED
Tetrahydrocannabinol: NOT DETECTED

## 2020-07-22 LAB — I-STAT CHEM 8, ED
BUN: 43 mg/dL — ABNORMAL HIGH (ref 6–20)
Calcium, Ion: 1.13 mmol/L — ABNORMAL LOW (ref 1.15–1.40)
Chloride: 104 mmol/L (ref 98–111)
Creatinine, Ser: 4 mg/dL — ABNORMAL HIGH (ref 0.61–1.24)
Glucose, Bld: 306 mg/dL — ABNORMAL HIGH (ref 70–99)
HCT: 37 % — ABNORMAL LOW (ref 39.0–52.0)
Hemoglobin: 12.6 g/dL — ABNORMAL LOW (ref 13.0–17.0)
Potassium: 4.6 mmol/L (ref 3.5–5.1)
Sodium: 139 mmol/L (ref 135–145)
TCO2: 27 mmol/L (ref 22–32)

## 2020-07-22 LAB — HIV ANTIBODY (ROUTINE TESTING W REFLEX): HIV Screen 4th Generation wRfx: NONREACTIVE

## 2020-07-22 LAB — APTT: aPTT: 31 seconds (ref 24–36)

## 2020-07-22 LAB — ECHOCARDIOGRAM COMPLETE
Area-P 1/2: 3.37 cm2
Calc EF: 57.7 %
Height: 70 in
S' Lateral: 3.38 cm
Single Plane A2C EF: 57.6 %
Single Plane A4C EF: 55.5 %
Weight: 2960 oz

## 2020-07-22 LAB — RESPIRATORY PANEL BY RT PCR (FLU A&B, COVID)
Influenza A by PCR: NEGATIVE
Influenza B by PCR: NEGATIVE
SARS Coronavirus 2 by RT PCR: NEGATIVE

## 2020-07-22 LAB — COMPREHENSIVE METABOLIC PANEL
ALT: 50 U/L — ABNORMAL HIGH (ref 0–44)
AST: 23 U/L (ref 15–41)
Albumin: 3.8 g/dL (ref 3.5–5.0)
Alkaline Phosphatase: 84 U/L (ref 38–126)
Anion gap: 9 (ref 5–15)
BUN: 44 mg/dL — ABNORMAL HIGH (ref 6–20)
CO2: 26 mmol/L (ref 22–32)
Calcium: 9.5 mg/dL (ref 8.9–10.3)
Chloride: 103 mmol/L (ref 98–111)
Creatinine, Ser: 4.02 mg/dL — ABNORMAL HIGH (ref 0.61–1.24)
GFR, Estimated: 17 mL/min — ABNORMAL LOW (ref >60)
Glucose, Bld: 314 mg/dL — ABNORMAL HIGH (ref 70–99)
Potassium: 4.6 mmol/L (ref 3.5–5.1)
Sodium: 138 mmol/L (ref 135–145)
Total Bilirubin: 0.8 mg/dL (ref 0.3–1.2)
Total Protein: 6.7 g/dL (ref 6.5–8.1)

## 2020-07-22 LAB — GLUCOSE, CAPILLARY
Glucose-Capillary: 198 mg/dL — ABNORMAL HIGH (ref 70–99)
Glucose-Capillary: 221 mg/dL — ABNORMAL HIGH (ref 70–99)

## 2020-07-22 LAB — DIFFERENTIAL
Abs Immature Granulocytes: 0.02 10*3/uL (ref 0.00–0.07)
Basophils Absolute: 0.1 10*3/uL (ref 0.0–0.1)
Basophils Relative: 1 %
Eosinophils Absolute: 0.2 10*3/uL (ref 0.0–0.5)
Eosinophils Relative: 3 %
Immature Granulocytes: 0 %
Lymphocytes Relative: 21 %
Lymphs Abs: 1.6 10*3/uL (ref 0.7–4.0)
Monocytes Absolute: 0.6 10*3/uL (ref 0.1–1.0)
Monocytes Relative: 8 %
Neutro Abs: 4.9 10*3/uL (ref 1.7–7.7)
Neutrophils Relative %: 67 %

## 2020-07-22 LAB — CBG MONITORING, ED: Glucose-Capillary: 264 mg/dL — ABNORMAL HIGH (ref 70–99)

## 2020-07-22 LAB — PROTIME-INR
INR: 0.9 (ref 0.8–1.2)
Prothrombin Time: 11.6 seconds (ref 11.4–15.2)

## 2020-07-22 LAB — CREATININE, SERUM
Creatinine, Ser: 3.84 mg/dL — ABNORMAL HIGH (ref 0.61–1.24)
GFR, Estimated: 18 mL/min — ABNORMAL LOW (ref >60)

## 2020-07-22 MED ORDER — ATORVASTATIN CALCIUM 80 MG PO TABS
80.0000 mg | ORAL_TABLET | Freq: Every day | ORAL | Status: DC
  Administered 2020-07-22 – 2020-07-24 (×3): 80 mg via ORAL
  Filled 2020-07-22 (×3): qty 1

## 2020-07-22 MED ORDER — ACETAMINOPHEN 650 MG RE SUPP: 650.0000 mg | RECTAL | Status: DC | PRN

## 2020-07-22 MED ORDER — ACETAMINOPHEN 325 MG PO TABS: 650.0000 mg | ORAL_TABLET | ORAL | Status: DC | PRN

## 2020-07-22 MED ORDER — HEPARIN SODIUM (PORCINE) 5000 UNIT/ML IJ SOLN
5000.0000 [IU] | Freq: Three times a day (TID) | INTRAMUSCULAR | Status: DC
  Administered 2020-07-22 – 2020-07-24 (×5): 5000 [IU] via SUBCUTANEOUS
  Filled 2020-07-22 (×5): qty 1

## 2020-07-22 MED ORDER — SODIUM CHLORIDE 0.9% FLUSH
3.0000 mL | Freq: Once | INTRAVENOUS | Status: AC
Start: 2020-07-22 — End: 2020-07-22
  Administered 2020-07-22: 3 mL via INTRAVENOUS

## 2020-07-22 MED ORDER — ASPIRIN EC 81 MG PO TBEC
81.0000 mg | DELAYED_RELEASE_TABLET | Freq: Every day | ORAL | Status: DC
  Administered 2020-07-23 – 2020-07-24 (×2): 81 mg via ORAL
  Filled 2020-07-22 (×2): qty 1

## 2020-07-22 MED ORDER — ACETAMINOPHEN 160 MG/5ML PO SOLN: 650.0000 mg | ORAL | Status: DC | PRN

## 2020-07-22 MED ORDER — INSULIN ASPART 100 UNIT/ML ~~LOC~~ SOLN
0.0000 [IU] | Freq: Every day | SUBCUTANEOUS | Status: DC
  Administered 2020-07-22: 2 [IU] via SUBCUTANEOUS
  Administered 2020-07-23: 3 [IU] via SUBCUTANEOUS

## 2020-07-22 MED ORDER — ASPIRIN 325 MG PO TABS
325.0000 mg | ORAL_TABLET | Freq: Every day | ORAL | Status: DC
  Administered 2020-07-22: 325 mg via ORAL
  Filled 2020-07-22: qty 1

## 2020-07-22 MED ORDER — CLOPIDOGREL BISULFATE 75 MG PO TABS
75.0000 mg | ORAL_TABLET | Freq: Every day | ORAL | Status: DC
  Administered 2020-07-23 – 2020-07-24 (×2): 75 mg via ORAL
  Filled 2020-07-22 (×2): qty 1

## 2020-07-22 MED ORDER — LABETALOL HCL 5 MG/ML IV SOLN
10.0000 mg | Freq: Once | INTRAVENOUS | Status: AC
  Administered 2020-07-22: 10 mg via INTRAVENOUS
  Filled 2020-07-22: qty 4

## 2020-07-22 MED ORDER — STROKE: EARLY STAGES OF RECOVERY BOOK
Freq: Once | Status: AC
  Filled 2020-07-22: qty 1

## 2020-07-22 MED ORDER — INSULIN ASPART 100 UNIT/ML ~~LOC~~ SOLN
0.0000 [IU] | Freq: Three times a day (TID) | SUBCUTANEOUS | Status: DC
  Administered 2020-07-22: 2 [IU] via SUBCUTANEOUS
  Administered 2020-07-23: 7 [IU] via SUBCUTANEOUS
  Administered 2020-07-23: 5 [IU] via SUBCUTANEOUS
  Administered 2020-07-24: 3 [IU] via SUBCUTANEOUS

## 2020-07-22 MED ORDER — SENNOSIDES-DOCUSATE SODIUM 8.6-50 MG PO TABS: 1.0000 | ORAL_TABLET | Freq: Every evening | ORAL | Status: DC | PRN

## 2020-07-22 NOTE — Consult Note (Addendum)
Neurology Consultation  Reason for Consult: Stroke Referring Physician: Mckinley Jewel, MD  CC: Gait instability and dysarthria  History is obtained from: Patient  HPI: Louis Peters is a 54 y.o. male with history of retinal detachment, uncontrolled hypertension, uncontrolled hypercholesterolemia, uncontrolled diabetes, chronic kidney disease.  Patient states that on 07/20/2020 he was out mowing the lawn when at approximately 1500 hrs. he noted that he was having difficulty with his gait.  He states that it seemed as though he just could not keep his balance that well.  When he came inside he and his wife noted he was also having difficulty with his speech-slurring his speech.  Patient did not come to the hospital.  Patient came to the hospital today at 07/22/2020.  MRI did reveal a left pontine infarct.  While in the ED his blood pressure was 241/109.  He admitted that he has not been taking his blood pressure medication nor his diabetic medication nor his cholesterol medication since Tuesday and prior to that he has not been compliant.  He denies smoking and/or drinking.  LKW: 07/20/2020 at approximately 1500 hrs. tpa given?: no, out of window Premorbid modified [name] scale (mRS): 0 NIHSS 1a Level of Conscious.: 0 1b LOC Questions: 0 1c LOC Commands: 0 2 Best Gaze: 0 3 Visual: 0 4 Facial Palsy: 1 5a Motor Arm - left: 0 5b Motor Arm - Right: 0 6a Motor Leg - Left: 0 6b Motor Leg - Right:0  7 Limb Ataxia: 2 8 Sensory: 0 9 Best Language: 0 10 Dysarthria: 1 11 Extinct. and Inatten.: 0 TOTAL: 4    Past Medical History:  Diagnosis Date  . Benign hypertension with chronic kidney disease, stage III ([place]) 03/10/2019  . Chronic kidney disease (CKD), stage III (moderate) ([place]) 03/10/2019  . Diabetes mellitus type 1, uncomplicated ([place]) 02/06/8468  . Diabetes mellitus without complication ([place])   . Diabetic retinopathy ([place])    PDR OU  . Heat Injury 03/10/2019  . Hypercholesterolemia  03/10/2019  . Hypertension   . Retinal detachment    TRD OD    Family History  Problem Relation Age of Onset  . Diabetes Maternal Grandmother   . Diabetes Sister    Social History:   reports that he has never smoked. He has never used smokeless tobacco. He reports current alcohol use. He reports that he does not use drugs.  Medications  Current Facility-Administered Medications:  .   stroke: mapping our early stages of recovery book, , Does not apply, Once, Pahwani, Rinka R, MD .  acetaminophen (TYLENOL) tablet 650 mg, 650 mg, Oral, Q4H PRN **OR** acetaminophen (TYLENOL) 160 MG/5ML solution 650 mg, 650 mg, Per Tube, Q4H PRN **OR** acetaminophen (TYLENOL) suppository 650 mg, 650 mg, Rectal, Q4H PRN, Pahwani, Rinka R, MD .  heparin injection 5,000 Units, 5,000 Units, Subcutaneous, Q8H, Pahwani, Rinka R, MD .  insulin aspart (novoLOG) injection 0-5 Units, 0-5 Units, Subcutaneous, QHS, Pahwani, Rinka R, MD .  insulin aspart (novoLOG) injection 0-9 Units, 0-9 Units, Subcutaneous, TID WC, Pahwani, Rinka R, MD .  senna-docusate (Senokot-S) tablet 1 tablet, 1 tablet, Oral, QHS PRN, Pahwani, Rinka R, MD  Current Outpatient Medications:  .  amLODipine (NORVASC) 10 MG tablet, Take 10 mg by mouth daily. , Disp: , Rfl:  .  atorvastatin (LIPITOR) 40 MG tablet, Take 40 mg by mouth at bedtime., Disp: , Rfl:  .  cholecalciferol (VITAMIN D3) 25 MCG (1000 UNIT) tablet, Take 1,000 Units by mouth daily., Disp: ,  Rfl:  .  glucagon 1 MG injection, Inject 1 mg into the skin once as needed (low Glucose). , Disp: , Rfl:  .  HUMALOG KWIKPEN 100 UNIT/ML KiwkPen, Inject 5-8 Units into the skin 3 (three) times daily. Take 5 units at breakfast and lunch and Take 8 units at dinner., Disp: , Rfl: 0 .  hydrALAZINE (APRESOLINE) 50 MG tablet, Take 50 mg by mouth 3 (three) times daily., Disp: , Rfl:  .  hydrochlorothiazide (HYDRODIURIL) 25 MG tablet, Take 25 mg by mouth daily., Disp: , Rfl:  .  insulin degludec (TRESIBA)  100 UNIT/ML SOPN FlexTouch Pen, Inject 14 Units into the skin at bedtime. , Disp: , Rfl:  .  losartan (COZAAR) 100 MG tablet, Take 100 mg by mouth daily. , Disp: , Rfl:  .  BAQSIMI ONE PACK 3 MG/DOSE POWD, Place 1 spray into both nostrils as directed. For low Glucose, Disp: , Rfl:  .  Blood Glucose Monitoring Suppl (Mariano Colon) w/Device KIT, by Does not apply route., Disp: , Rfl:  .  Insulin Pen Needle (FIFTY50 PEN NEEDLES) 32G X 4 MM MISC, Use with lantus and humalog 4 imes per day, Disp: , Rfl:  .  montelukast (SINGULAIR) 10 MG tablet, Take 10 mg by mouth at bedtime., Disp: , Rfl:  .  ONE TOUCH LANCETS MISC, Use to check blood sugar 8 time(s) daily, Disp: , Rfl:   ROS:    General ROS: negative for - chills, fatigue, fever, night sweats, weight gain or weight loss Psychological ROS: negative for - behavioral disorder, hallucinations, memory difficulties, mood swings or suicidal ideation Ophthalmic ROS: negative for - blurry vision, double vision, eye pain or loss of vision Respiratory ROS: negative for - cough, hemoptysis, shortness of breath or wheezing Cardiovascular ROS: negative for - chest pain, dyspnea on exertion, edema or irregular heartbeat Gastrointestinal ROS: negative for - abdominal pain, diarrhea, hematemesis, nausea/vomiting or stool incontinence Genito-Urinary ROS: negative for - dysuria, hematuria, incontinence or urinary frequency/urgency Musculoskeletal ROS: negative for - joint swelling or muscular weakness Neurological ROS: as noted in HPI Dermatological ROS: negative for rash and skin lesion changes  Exam: Current vital signs: BP (!) 187/107 (BP Location: Right Arm)   Pulse 66   Temp 97.8 F (36.6 C) (Oral)   Resp 16   Ht _0  (1.778 m)   Wt 83.9 kg   SpO2 100%   BMI 26.54 kg/m  Vital signs in last 24 hours: Temp:  [97.8 F (36.6 C)] 97.8 F (36.6 C) (10/21 0822) Pulse Rate:  [66-71] 66 (10/21 1214) Resp:  [12-18] 16 (10/21 1214) BP:  (187-250)/(101-116) 187/107 (10/21 1214) SpO2:  [98 %-100 %] 100 % (10/21 1214) Weight:  [83.9 kg] 83.9 kg (10/21 0822)   Constitutional: Appears well-developed and well-nourished.  Eyes: No scleral injection HENT: No OP obstrucion Head: Normocephalic.  Cardiovascular: Normal rate and regular rhythm.  Respiratory: Effort normal, non-labored breathing GI: Soft.  No distension. There is no tenderness.  Skin: WDI  Neuro: Mental Status: Patient is awake, alert, oriented but shows dysarthria.  He has no aphasia.  He has good recollection of events.  He is able to follow commands with no difficulty. Cranial Nerves: II: Visual Fields are full.  III,IV, VI: EOMI without ptosis or diploplia. Pupils equal, round and reactive to light V: Facial sensation is symmetric to temperature VII: Right nasolabial fold decrease VIII: hearing is intact to voice X: Palat elevates symmetrically XI: Shoulder shrug is symmetric. XII: tongue  is midline without atrophy or fasciculations.  Motor: Tone is normal. Bulk is normal. 5/5 strength was present in all four extremities.  No drift Patient does orbit his left fingers around his right fingers Sensory: Sensation is symmetric to light touch and temperature in the arms and legs. Deep Tendon Reflexes: 2+ and symmetric in the biceps and patellae.  Plantars: Toes are downgoing bilaterally.  Cerebellar: Right finger-nose-finger shows dysmetria and right heel-to-shin shows dysmetria  Labs I have reviewed labs in epic and the results pertinent to this consultation are:  CBC    Component Value Date/Time   WBC 7.3 07/22/2020 0833   RBC 4.18 (L) 07/22/2020 0833   HGB 12.6 (L) 07/22/2020 0840   HCT 37.0 (L) 07/22/2020 0840   PLT 205 07/22/2020 0833   MCV 91.9 07/22/2020 0833   MCH 28.5 07/22/2020 0833   MCHC 31.0 07/22/2020 0833   RDW 12.9 07/22/2020 0833   LYMPHSABS 1.6 07/22/2020 0833   MONOABS 0.6 07/22/2020 0833   EOSABS 0.2 07/22/2020 0833    BASOSABS 0.1 07/22/2020 0833    CMP     Component Value Date/Time   NA 139 07/22/2020 0840   K 4.6 07/22/2020 0840   CL 104 07/22/2020 0840   CO2 26 07/22/2020 0833   GLUCOSE 306 (H) 07/22/2020 0840   BUN 43 (H) 07/22/2020 0840   CREATININE 4.00 (H) 07/22/2020 0840   CALCIUM 9.5 07/22/2020 0833   PROT 6.7 07/22/2020 0833   ALBUMIN 3.8 07/22/2020 0833   AST 23 07/22/2020 0833   ALT 50 (H) 07/22/2020 0833   ALKPHOS 84 07/22/2020 0833   BILITOT 0.8 07/22/2020 0833   GFRNONAA 17 (L) 07/22/2020 0833   GFRAA 20 (L) 05/24/2019 2020    Lipid Panel  No results found for: CHOL, TRIG, HDL, CHOLHDL, VLDL, LDLCALC, LDLDIRECT   Imaging I have reviewed the images obtained:  CT-scan of the brain-age-indeterminate small white matter infarct involving the right corona radiata.  No evidence of acute large vascular territory infarct.  MRI examination of the brain-13 mm acute/early subacute infarct in the left pons.  Chronic lacunar infarcts within the bilateral cerebral white matter.  Background mild cerebral atrophy and mild chronic small vessel ischemic disease.  Etta Quill PA-C [facility] [phone]  M-F  (9:00 am- 5:00 PM)  07/22/2020, 1:33 PM     Assessment:  This is a 54 year old male presenting to the hospital with 3 days of gait instability and dysarthria.  MRI confirms 13 mm acute/early subacute infarct in the left pons.  Exam confirms dysarthria, and ataxia of both right finger-nose and right heel-to-shin.  Patient has known medication noncompliance.  Patient will need a stroke work-up at this point time.  Impression: -Left pontine infarct in the setting of significant hypertension  Recommend -MRI of the brain without contrast -MRA Head and neck without contrast -Transthoracic Echo  -Start patient on ASA 345m daily  -Increase atorvastatin 80 mg -BP goal: permissive HTN upto 220/120 mmHg -HBAIC and Lipid profile -Telemetry monitoring -Frequent neuro  checks -NPO until passes stroke swallow screen -PT/OT # please page stroke NP  Or  PA  Or MD from 8am -4 pm  as this patient from this time will be  followed by the stroke.   You can look them up on [email or web address]  [facility] addendum Patient seen and examined for a subacute stroke-last known normal 07/20/2020 at 3 PM. Outside stroke window Agree with history and physical documented above.  I have independently  examined the patient and independently reviewed imaging. Left pontine stroke-likely small vessel etiology due to uncontrolled risk factors. Recommendations as above Stroke team to continue to follow the patient  -- Amie Portland, MD [facility] Pager: [phone] If 7pm to 7am, please call on call as listed on AMION.

## 2020-07-22 NOTE — ED Notes (Signed)
Patient transported to MRI 

## 2020-07-22 NOTE — Progress Notes (Signed)
°  Echocardiogram 2D Echocardiogram has been performed.  Louis Peters 07/22/2020, 4:15 PM

## 2020-07-22 NOTE — ED Triage Notes (Signed)
Patient arrives to ED with complaints of slurred speech and loss of balance since Tuesday 10/19. Pt has uncontrolled HTN. Pt denies other neuro deficits.

## 2020-07-22 NOTE — ED Notes (Signed)
Pt returned from MRI °

## 2020-07-22 NOTE — ED Provider Notes (Signed)
[facility] EMERGENCY DEPARTMENT Provider Note   CSN: [id number] Arrival date & time: 07/22/20  8657     History Chief Complaint  Patient presents with  . Slurred Speech    Louis Peters is a 54 y.o. male.  The history is provided by the patient, the spouse and medical records.   Louis Peters is a 54 y.o. male who presents to the Emergency Department complaining of speech difficulties. He presents the emergency department accompanied by his wife for evaluation of difficulty speaking and trouble walking for the last two days. Symptoms are constant and mild in nature. He reports similar episode in the past when he was admitted to the hospital. He denies any fevers, nausea, vomiting, headache, chest pain, difficulty breathing, numbness, weakness, vision changes. He does have a history of hypertension, type I diabetes, CKD stage IV. He states that he is not taken his blood pressure medications for the last last few days. He denies any tobacco, alcohol, drug use. He has been fully vaccinated for COVID-19.    Past Medical History:  Diagnosis Date  . Benign hypertension with chronic kidney disease, stage III ([place]) 03/10/2019  . Chronic kidney disease (CKD), stage III (moderate) ([place]) 03/10/2019  . Diabetes mellitus type 1, uncomplicated ([place]) 05/05/6961  . Diabetes mellitus without complication ([place])   . Diabetic retinopathy ([place])    PDR OU  . Heat Injury 03/10/2019  . Hypercholesterolemia 03/10/2019  . Hypertension   . Retinal detachment    TRD OD    Patient Active Problem List   Diagnosis Date Noted  . Hypertension   . Hypertensive urgency   . Ischemic stroke ([place])   . AKI (acute kidney injury) ([place]) 03/10/2019  . Heat Injury 03/10/2019  . Chronic kidney disease (CKD), stage III (moderate) ([place]) 03/10/2019  . Benign hypertension with chronic kidney disease, stage III ([place]) 03/10/2019  . Hypercholesterolemia 03/10/2019  . Acute kidney injury superimposed on  chronic kidney disease ([place])   . Diabetes mellitus type 1, uncomplicated ([place]) 95/28/4132    Past Surgical History:  Procedure Laterality Date  . CATARACT EXTRACTION    . EYE SURGERY    . RETINAL DETACHMENT SURGERY         Family History  Problem Relation Age of Onset  . Diabetes Maternal Grandmother   . Diabetes Sister     Social History   Tobacco Use  . Smoking status: Never Smoker  . Smokeless tobacco: Never Used  Vaping Use  . Vaping Use: Never used  Substance Use Topics  . Alcohol use: Yes    Comment: occ  . Drug use: No    Home Medications Prior to Admission medications   Medication Sig Start Date End Date Taking? Authorizing Provider  amLODipine (NORVASC) 10 MG tablet Take 10 mg by mouth daily.  01/16/17  Yes [provider]  atorvastatin (LIPITOR) 40 MG tablet Take 40 mg by mouth at bedtime. 07/07/20  Yes [provider]  cholecalciferol (VITAMIN D3) 25 MCG (1000 UNIT) tablet Take 1,000 Units by mouth daily.   Yes [provider]  glucagon 1 MG injection Inject 1 mg into the skin once as needed (low Glucose).  07/31/18  Yes [provider]  HUMALOG KWIKPEN 100 UNIT/ML KiwkPen Inject 5-8 Units into the skin 3 (three) times daily. Take 5 units at breakfast and lunch and Take 8 units at dinner. 02/08/17  Yes [provider]  hydrALAZINE (APRESOLINE) 50 MG tablet Take 50 mg by  mouth 3 (three) times daily. 05/14/20  Yes [provider]  hydrochlorothiazide (HYDRODIURIL) 25 MG tablet Take 25 mg by mouth daily. 05/14/20  Yes [provider]  insulin degludec (TRESIBA) 100 UNIT/ML SOPN FlexTouch Pen Inject 14 Units into the skin at bedtime.  04/30/19  Yes [provider]  losartan (COZAAR) 100 MG tablet Take 100 mg by mouth daily.  08/12/18  Yes [provider]  BAQSIMI ONE PACK 3 MG/DOSE POWD Place 1 spray into both nostrils as directed. For low Glucose 02/23/20   [provider]  Blood  Glucose Monitoring Suppl ([place]) w/Device KIT by Does not apply route. 07/31/18   [provider]  Insulin Pen Needle (FIFTY50 PEN NEEDLES) 32G X 4 MM MISC Use with lantus and humalog 4 imes per day 07/26/17   [provider]  montelukast (SINGULAIR) 10 MG tablet Take 10 mg by mouth at bedtime.    [provider]  ONE TOUCH LANCETS MISC Use to check blood sugar 8 time(s) daily 07/31/18   [provider]    Allergies    Fexofenadine and Latex  Review of Systems   Review of Systems  All other systems reviewed and are negative.   Physical Exam Updated Vital Signs BP (!) 210/104   Pulse 69   Temp 98.5 F (36.9 C)   Resp 18   Ht _0  (1.778 m)   Wt 83.9 kg   SpO2 100%   BMI 26.54 kg/m   Physical Exam Vitals and nursing note reviewed.  Constitutional:      Appearance: He is well-developed.  HENT:     Head: Normocephalic and atraumatic.  Eyes:     Extraocular Movements: Extraocular movements intact.  Cardiovascular:     Rate and Rhythm: Normal rate and regular rhythm.     Heart sounds: No murmur heard.   Pulmonary:     Effort: Pulmonary effort is normal. No respiratory distress.     Breath sounds: Normal breath sounds.  Abdominal:     Palpations: Abdomen is soft.     Tenderness: There is no abdominal tenderness. There is no guarding or rebound.  Musculoskeletal:        General: No tenderness.  Skin:    General: Skin is warm and dry.  Neurological:     Mental Status: He is alert and oriented to person, place, and time.     Comments: No asymmetry of facial movements. There is mild ataxia on finger to nose with the left upper extremity. No pronator drift. Visual fields grossly intact. Five out of five strength in all four extremities with sensation to light touch intact in all four extremities. Fluent speech.  Psychiatric:        Behavior: Behavior normal.     ED Results / Procedures / Treatments   Labs (all  labs ordered are listed, but only abnormal results are displayed) Labs Reviewed  CBC - Abnormal; Notable for the following components:      Result Value   RBC 4.18 (*)    Hemoglobin 11.9 (*)    HCT 38.4 (*)    All other components within normal limits  COMPREHENSIVE METABOLIC PANEL - Abnormal; Notable for the following components:   Glucose, Bld 314 (*)    BUN 44 (*)    Creatinine, Ser 4.02 (*)    ALT 50 (*)    GFR, Estimated 17 (*)    All other components within normal limits  URINALYSIS, ROUTINE W  REFLEX MICROSCOPIC - Abnormal; Notable for the following components:   Color, Urine STRAW (*)    Glucose, UA >=500 (*)    Protein, ur >=300 (*)    Bacteria, UA RARE (*)    All other components within normal limits  I-STAT CHEM 8, ED - Abnormal; Notable for the following components:   BUN 43 (*)    Creatinine, Ser 4.00 (*)    Glucose, Bld 306 (*)    Calcium, Ion 1.13 (*)    Hemoglobin 12.6 (*)    HCT 37.0 (*)    All other components within normal limits  CBG MONITORING, ED - Abnormal; Notable for the following components:   Glucose-Capillary 264 (*)    All other components within normal limits  RESPIRATORY PANEL BY RT PCR (FLU A&B, COVID)  PROTIME-INR  APTT  DIFFERENTIAL  RAPID URINE DRUG SCREEN, HOSP PERFORMED  HIV ANTIBODY (ROUTINE TESTING W REFLEX)  CBC  CREATININE, SERUM  HEMOGLOBIN A1C    EKG None  Radiology CT HEAD WO CONTRAST  Result Date: 07/22/2020 CLINICAL DATA:  Neuro deficit, acute stroke suspected. Slurred speech. Hypertension. EXAM: CT HEAD WITHOUT CONTRAST TECHNIQUE: Contiguous axial images were obtained from the base of the skull through the vertex without intravenous contrast. COMPARISON:  None. FINDINGS: Brain: Hypodensity within the right corona radiata, compatible with age-indeterminate white matter infarct. Additional patchy white matter hypoattenuation, favored to reflect the sequela of chronic microvascular ischemic disease. No evidence of acute  large vascular territory infarct. No acute hemorrhage. No hydrocephalus. No mass lesion or abnormal mass effect. Vascular: No hyperdense vessel or unexpected calcification. Calcific atherosclerosis. Skull: No acute fracture Sinuses/Orbits: Mild inferior right maxillary sinus mucosal thickening with opacification of left anterior ethmoid cell air cells and left frontal sinus. Other: No mastoid effusions. IMPRESSION: 1. Age indeterminate small white matter infarct involving the right corona radiata. No evidence of acute large vascular territory infarct. MRI could further evaluate if clinically indicated. 2. Predominately left frontoethmoidal sinus disease. Correlate with signs/symptoms of sinusitis. Electronically Signed   By: Margaretha Sheffield MD   On: 07/22/2020 09:42   MR ANGIO HEAD WO CONTRAST  Result Date: 07/22/2020 CLINICAL DATA:  Stroke, follow-up. EXAM: MRA NECK WITHOUT CONTRAST MRA HEAD WITHOUT CONTRAST TECHNIQUE: Multiplanar and multiecho pulse sequences of the neck were obtained without intravenous contrast. Angiographic images of the neck were obtained using MRA technique without intravenous contast.; Angiographic images of the Circle of Willis were obtained using MRA technique without intravenous contrast. COMPARISON:  07/22/2020 CT and MRI head. FINDINGS: MRA NECK FINDINGS There is no high-grade narrowing or focal aneurysm involving the bilateral carotid arteries. No evidence of dissection. The bilateral vertebral arteries are patent and demonstrate antegrade flow. Dominant right vertebral artery. No evidence of high-grade narrowing or focal aneurysm. MRA HEAD FINDINGS Anterior circulation: Patent normal caliber bilateral ICAs. Patent normal caliber anterior and middle cerebral arteries. No significant stenosis, proximal occlusion, aneurysm, or vascular malformation. Posterior circulation: Dominant right vertebral artery terminating as basilar. The left vertebral artery likely terminates as PICA.  Dominant right AICA. Patent bilateral PCAs with fetal origin on the left. Patent right superior cerebellar artery. High-grade narrowing of the proximal to mid left superior cerebellar artery with non opacification distally. No aneurysm, or vascular malformation. Venous sinuses: No evidence of thrombosis. Anatomic variants: None of significance. IMPRESSION: High-grade narrowing of the proximal to mid left superior cerebellar artery with non-opacification distally. No high-grade narrowing, large vessel occlusion, dissection or aneurysm within the neck. Electronically Signed  By: Primitivo Gauze M.D.   On: 07/22/2020 15:18   MR ANGIO NECK WO CONTRAST  Result Date: 07/22/2020 CLINICAL DATA:  Stroke, follow-up. EXAM: MRA NECK WITHOUT CONTRAST MRA HEAD WITHOUT CONTRAST TECHNIQUE: Multiplanar and multiecho pulse sequences of the neck were obtained without intravenous contrast. Angiographic images of the neck were obtained using MRA technique without intravenous contast.; Angiographic images of the Circle of Willis were obtained using MRA technique without intravenous contrast. COMPARISON:  07/22/2020 CT and MRI head. FINDINGS: MRA NECK FINDINGS There is no high-grade narrowing or focal aneurysm involving the bilateral carotid arteries. No evidence of dissection. The bilateral vertebral arteries are patent and demonstrate antegrade flow. Dominant right vertebral artery. No evidence of high-grade narrowing or focal aneurysm. MRA HEAD FINDINGS Anterior circulation: Patent normal caliber bilateral ICAs. Patent normal caliber anterior and middle cerebral arteries. No significant stenosis, proximal occlusion, aneurysm, or vascular malformation. Posterior circulation: Dominant right vertebral artery terminating as basilar. The left vertebral artery likely terminates as PICA. Dominant right AICA. Patent bilateral PCAs with fetal origin on the left. Patent right superior cerebellar artery. High-grade narrowing of the  proximal to mid left superior cerebellar artery with non opacification distally. No aneurysm, or vascular malformation. Venous sinuses: No evidence of thrombosis. Anatomic variants: None of significance. IMPRESSION: High-grade narrowing of the proximal to mid left superior cerebellar artery with non-opacification distally. No high-grade narrowing, large vessel occlusion, dissection or aneurysm within the neck. Electronically Signed   By: Primitivo Gauze M.D.   On: 07/22/2020 15:18   MR BRAIN WO CONTRAST  Result Date: 07/22/2020 CLINICAL DATA:  Neuro deficit, acute, stroke suspected. Additional history provided: Slurred speech, loss of balance Tuesday 10 19, uncontrolled hypertension. EXAM: MRI HEAD WITHOUT CONTRAST TECHNIQUE: Multiplanar, multiecho pulse sequences of the brain and surrounding structures were obtained without intravenous contrast. COMPARISON:  Prior head CT 07/22/2020. FINDINGS: Brain: Mild generalized cerebral atrophy. 13 mm focus of restricted diffusion within the left pons consistent with acute/early subacute infarction (series 3, image 15). Chronic lacunar infarcts within the bilateral cerebral white matter. Background mild multifocal T2/FLAIR hyperintensity within the cerebral white matter and pons is nonspecific, but compatible with chronic small vessel ischemic disease. No evidence of intracranial mass. No chronic intracranial blood products. No extra-axial fluid collection. No midline shift. Vascular: Expected proximal arterial flow voids. Skull and upper cervical spine: Nonspecific heterogeneous marrow signal of the visualized upper cervical spine with regions of abnormal T1 hypointensity (series 5, image 12). Sinuses/Orbits: Visualized orbits show no acute finding. Complete T2 hyperintense opacification of the left frontal and anterior ethmoid sinuses. Mild mucosal thickening within the remainder of the ethmoid air cells and right greater than left maxillary sinuses. No significant  mastoid effusion. IMPRESSION: 13 mm acute/early subacute infarct within the left pons. Chronic lacunar infarcts within the bilateral cerebral white matter. Background mild cerebral atrophy and mild chronic small vessel ischemic disease. Paranasal sinus disease as described. Most notably, there is complete T2 hyperintense opacification of the left frontal and anterior ethmoid sinuses. Heterogeneous marrow signal of the visualized upper cervical spine with regions of abnormal T1 hypointensity. These findings are nonspecific. Consider cervical spine MRI with contrast for further evaluation, particularly if the patient has a history of known malignancy with the possibility of osseous metastases. Electronically Signed   By: Kellie Simmering DO   On: 07/22/2020 12:15    Procedures Procedures (including critical care time)  Medications Ordered in ED Medications   stroke: mapping our early stages of recovery book (has  no administration in time range)  acetaminophen (TYLENOL) tablet 650 mg (has no administration in time range)    Or  acetaminophen (TYLENOL) 160 MG/5ML solution 650 mg (has no administration in time range)    Or  acetaminophen (TYLENOL) suppository 650 mg (has no administration in time range)  senna-docusate (Senokot-S) tablet 1 tablet (has no administration in time range)  heparin injection 5,000 Units (has no administration in time range)  insulin aspart (novoLOG) injection 0-5 Units (has no administration in time range)  insulin aspart (novoLOG) injection 0-9 Units (has no administration in time range)  aspirin tablet 325 mg (has no administration in time range)  atorvastatin (LIPITOR) tablet 80 mg (has no administration in time range)  sodium chloride flush (NS) 0.9 % injection 3 mL (3 mLs Intravenous Given 07/22/20 0916)  labetalol (NORMODYNE) injection 10 mg (10 mg Intravenous Given 07/22/20 1029)    ED Course  I have reviewed the triage vital signs and the nursing  notes.  Pertinent labs & imaging results that were available during my care of the patient were reviewed by me and considered in my medical decision making (see chart for details).    MDM Rules/Calculators/A&P                         patient with history of hypertension does have a mild ataxia on finger to nose on the left upper extremity. He is markedly hypertensive on ED presentation, did not take his home medications. Will treat with one-time dose of labetalol pending further workup.  CT head with age indeterminate infarct. Discussed with neurologist, Dr Rory Percy, will obtain MRI to further evaluate.  MRI with subacute infarct. Plan to admit for further treatment. Patient and wife updated findings of studies and recommendation for admission and they are in agreement with treatment plan. Medicine consulted for admission.  Final Clinical Impression(s) / ED Diagnoses Final diagnoses:  Acute CVA (cerebrovascular accident) [facility])    Rx / [place] Orders ED Discharge Orders    None       Quintella Reichert, MD 07/22/20 1540
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
## 2020-07-23 LAB — BASIC METABOLIC PANEL
Anion gap: 7 (ref 5–15)
BUN: 37 mg/dL — ABNORMAL HIGH (ref 6–20)
CO2: 27 mmol/L (ref 22–32)
Calcium: 9.1 mg/dL (ref 8.9–10.3)
Chloride: 107 mmol/L (ref 98–111)
Creatinine, Ser: 3.81 mg/dL — ABNORMAL HIGH (ref 0.61–1.24)
GFR, Estimated: 18 mL/min — ABNORMAL LOW (ref >60)
Glucose, Bld: 110 mg/dL — ABNORMAL HIGH (ref 70–99)
Potassium: 3.8 mmol/L (ref 3.5–5.1)
Sodium: 141 mmol/L (ref 135–145)

## 2020-07-23 LAB — CBC
HCT: 35.3 % — ABNORMAL LOW (ref 39.0–52.0)
Hemoglobin: 11.2 g/dL — ABNORMAL LOW (ref 13.0–17.0)
MCH: 28.6 pg (ref 26.0–34.0)
MCHC: 31.7 g/dL (ref 30.0–36.0)
MCV: 90.3 fL (ref 80.0–100.0)
Platelets: 199 10*3/uL (ref 150–400)
RBC: 3.91 MIL/uL — ABNORMAL LOW (ref 4.22–5.81)
RDW: 13.1 % (ref 11.5–15.5)
WBC: 6.1 10*3/uL (ref 4.0–10.5)
nRBC: 0 % (ref 0.0–0.2)

## 2020-07-23 LAB — GLUCOSE, CAPILLARY
Glucose-Capillary: 93 mg/dL (ref 70–99)
Glucose-Capillary: 255 mg/dL — ABNORMAL HIGH (ref 70–99)
Glucose-Capillary: 367 mg/dL — ABNORMAL HIGH (ref 70–99)
Glucose-Capillary: 335 mg/dL — ABNORMAL HIGH (ref 70–99)
Glucose-Capillary: 263 mg/dL — ABNORMAL HIGH (ref 70–99)

## 2020-07-23 LAB — LIPID PANEL
Cholesterol: 164 mg/dL (ref 0–200)
HDL: 78 mg/dL (ref >40)
LDL Cholesterol: 74 mg/dL (ref 0–99)
Total CHOL/HDL Ratio: 2.1 RATIO
Triglycerides: 60 mg/dL (ref <150)
VLDL: 12 mg/dL (ref 0–40)

## 2020-07-23 LAB — HEMOGLOBIN A1C
Hgb A1c MFr Bld: 9.6 % — ABNORMAL HIGH (ref 4.8–5.6)
Mean Plasma Glucose: 229 mg/dL

## 2020-07-23 MED ORDER — HYDRALAZINE HCL 50 MG PO TABS: 50.0000 mg | ORAL_TABLET | Freq: Three times a day (TID) | ORAL | Status: DC

## 2020-07-23 MED ORDER — AMLODIPINE BESYLATE 10 MG PO TABS
10.0000 mg | ORAL_TABLET | Freq: Every day | ORAL | Status: DC
  Administered 2020-07-23 – 2020-07-24 (×2): 10 mg via ORAL
  Filled 2020-07-23 (×2): qty 1

## 2020-07-23 MED ORDER — INSULIN GLARGINE 100 UNIT/ML ~~LOC~~ SOLN
7.0000 [IU] | Freq: Every day | SUBCUTANEOUS | Status: DC
  Administered 2020-07-23 – 2020-07-24 (×2): 7 [IU] via SUBCUTANEOUS
  Filled 2020-07-23 (×2): qty 0.07

## 2020-07-23 MED ORDER — LOSARTAN POTASSIUM 50 MG PO TABS
100.0000 mg | ORAL_TABLET | Freq: Every day | ORAL | Status: DC
  Administered 2020-07-23 – 2020-07-24 (×2): 100 mg via ORAL
  Filled 2020-07-23 (×2): qty 2

## 2020-07-23 MED ORDER — HYDRALAZINE HCL 50 MG PO TABS
50.0000 mg | ORAL_TABLET | Freq: Three times a day (TID) | ORAL | Status: DC
  Administered 2020-07-23 – 2020-07-24 (×3): 50 mg via ORAL
  Filled 2020-07-23 (×3): qty 1

## 2020-07-23 MED ORDER — HYDROCHLOROTHIAZIDE 25 MG PO TABS
25.0000 mg | ORAL_TABLET | Freq: Every day | ORAL | Status: DC
  Administered 2020-07-23 – 2020-07-24 (×2): 25 mg via ORAL
  Filled 2020-07-23 (×2): qty 1

## 2020-07-23 MED ORDER — INSULIN DEGLUDEC 100 UNIT/ML ~~LOC~~ SOPN: 14.0000 [IU] | PEN_INJECTOR | Freq: Every day | SUBCUTANEOUS | Status: DC

## 2020-07-23 NOTE — Progress Notes (Signed)
Occupational therapy Evaluation  PTA, pt independent and enjoyed mowing yards. Presents with RUE dysmetria, causing increased difficulty with in-hand manipulation skills and coordination. Began educating pt on HEP to improve functional use RUE. Will follow up prior to DC to complete education. Do not anticipate OT needs after DC.     07/23/20 1417  OT Visit Information  Last OT Received On 07/23/20  Assistance Needed +1  History of Present Illness 54 y.o. male with PMH of uncontrolled hypertension, uncontrolled hypercholesterolemia, uncontrolled type I diabetes with retinopathy, chronic kidney disease, retinal detachment s/p repair. Pt admitted to [facility] on 10/21 for slurred speech, 2 day history of gait imbalance. 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  Precautions  Precautions None  Home Living  Family/patient expects to be discharged to: Private residence  Living Arrangements Spouse/significant other;Children  Available Help at Discharge Family;Available PRN/intermittently  Type of Home House  Home Access Stairs to enter  Entrance Stairs-Number of Steps 5  Entrance Stairs-Rails Right;Left  Home Layout Two level;Able to live on main level with bedroom/bathroom  Bathroom Shower/Tub Tub/shower unit  [employer] Handicapped height  Bathroom Accessibility Yes  How Accessible Accessible via walker  [place] None  Prior Function  Level of Independence Independent  Comments Pt works in Teacher, adult education care, wife and 2 kids live  with him; drives  Engineer, [employer] Expressive difficulties ("I'm slurring my words")  Pain Assessment  Pain Assessment No/denies pain  Cognition  Arousal/Alertness Awake/alert  Behavior During Therapy WFL for tasks assessed/performed  Overall Cognitive Status Within Functional Limits for tasks assessed  General Comments lack of insight into deficits, most likely close to baseline  Upper Extremity Assessment  Upper Extremity Assessment RUE  deficits/detail  RUE Deficits / Details strength slightly weaker but functional; dysmetria; decreased in-hand manipulation skills; difficulty with fine motor control/coordination  RUE Coordination decreased fine motor (but using functionally)  Lower Extremity Assessment  Lower Extremity Assessment Defer to PT evaluation  Cervical / Trunk Assessment  Cervical / Trunk Assessment Normal (hx of back problems)  ADL  Overall ADL's  Needs assistance/impaired  Functional mobility during ADLs Independent  General ADL Comments Overall close to baseline however has significatn difficulty with tying shoes, buttoning - fine motor skills for ADL; needs assistance for medication management  Vision- History  Baseline Vision/History No visual deficits  Vision- Assessment  Additional Comments no reports of visual cahnges  Perception  Comments WFL  Praxis  Praxis tested? WFL  Bed Mobility  Overal bed mobility Independent  Transfers  Overall transfer level Independent  Balance  Overall balance assessment No apparent balance deficits (not formally assessed)  Exercises  Exercises Other exercises  Other Exercises  Other Exercises BE FAST - s/s of CVA reviewed with pt  Other Exercises in-hand manipulation skills using coins  Other Exercises coordination tasks  OT - End of Session  Activity Tolerance Patient tolerated treatment well  Patient left Other (comment) (on couch with wife)  Nurse Communication Mobility status;Other (comment) (follow up for education on HEP)  OT Assessment  OT Recommendation/Assessment Patient needs continued OT Services  OT Visit Diagnosis Muscle weakness (generalized) (M62.81)  OT Problem List Decreased coordination;Impaired UE functional use  OT Plan  OT Frequency (ACUTE ONLY) Min 2X/week  OT Treatment/Interventions (ACUTE ONLY) Self-care/ADL training;Therapeutic exercise;Neuromuscular education;Therapeutic activities;Patient/family education  AM-PAC OT "6 Clicks"  Daily Activity Outcome Measure (Version 2)  Help from another person eating meals? 4  Help from another person taking care of personal grooming? 4  Help from another person toileting, which includes using toliet, bedpan, or urinal? 4  Help from another person bathing (including washing, rinsing, drying)? 4  Help from another person to put on and taking off regular upper body clothing? 4  Help from another person to put on and taking off regular lower body clothing? 3  6 Click Score 23  OT Recommendation  Follow Up Recommendations No OT follow up;Supervision - Intermittent  OT Equipment None recommended by OT  Individuals Consulted  Consulted and Agree with Results and Recommendations Patient;Family member/caregiver  Family Member Consulted wife  Acute Rehab OT Goals  Patient Stated Goal go home  OT Goal Formulation With patient/family  Time For Goal Achievement 07/30/20  Potential to Achieve Goals Good  OT Time Calculation  OT Start Time (ACUTE ONLY) 1359  OT Stop Time (ACUTE ONLY) 1421  OT Time Calculation (min) 22 min  OT General Charges  $OT Visit 1 Visit  OT Evaluation  $OT Eval Low Complexity 1 Low  Written Expression  Dominant Hand Left   Maurie Boettcher, OT/L   Acute OT Clinical Specialist Acute Rehabilitation Services Pager [phone] Office [phone]

## 2020-07-23 NOTE — Progress Notes (Signed)
Progress Note    Louis Peters  DVV:[id number] DOB: 02-21-1966  DOA: 07/22/2020 PCP: [facility]    Brief Narrative:     Medical records reviewed and are as summarized below:  Louis Peters is an 54 y.o. male with medical history significant of hypertension, hyperlipidemia, type 1 diabetes mellitus, CKD stage III, diabetic retinopathy/neuropathy/nephropathy presents to emergency department for evaluation of slurred speech and gait instability since 2 days.  Patient tells me that he has slurred speech and problem with his gait since 2 days.  He denies any other symptoms such as headache, blurry vision, lightheadedness, dizziness, chest pain, shortness of breath, palpitation, leg swelling, numbness weakness tingling sensation in hands or feet, head trauma, seizures, loss of consciousness, facial drop, previous history of stroke.  Reports that he has not been taking his antihypertensive medication since 3 to 4 days because antihypertensive medications makes him dizzy.  Assessment/Plan:   Principal Problem:   Ischemic stroke ([place]) Active Problems:   Diabetes mellitus type 1, uncomplicated ([place])   Hypercholesterolemia   Acute kidney injury superimposed on chronic kidney disease ([place])   Hypertensive urgency   Ischemic stroke: -Patient presented with slurred speech and difficulty walking since 2 days.   -MRI: 13 mm acute/early subacute infarct within the left pons. -will need ASA plus plavix for 3 weeks then ASA alone -echo: No intracardiac source of embolism detected on this transthoracic study -Neurology consult appreciated -PT/OT eval pending  Hypertensive urgency: -Secondary to noncompliance with medications. - Blood pressure 240/117 upon arrival.  Labetalol was given in ED.  Blood pressure improved.   -resume home meds and adjust for better control  AKI on CKD stage IV: -BUN: 44, creatinine: 4.02, GFR: 17 (baseline creatinine:  3.68, GFR: 20-22) -follow with Dr. 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 Code Status: Full Code.  Disposition Plan: Status is: Inpatient  Remains inpatient appropriate because:Inpatient level of care appropriate due to severity of illness   Dispo: The patient is from: Home              Anticipated d/c is to: Home              Anticipated d/c date is: 1 day              Patient currently is not medically stable to d/c.- needs better BP control         Medical Consultants:    Neurology  Subjective:   No headache, BP runs high at home  Objective:    Vitals:   07/22/20 2159 07/23/20 0039 07/23/20 0419 07/23/20 1132  BP: (!) 192/98 (!) 159/82 (!) 183/93 (!) 194/106  Pulse: 72 62 63 73  Resp: 15 18 15 19   Temp: 98.4 F (36.9 C) 98.5 F (36.9 C) 98.2 F (36.8 C) 97.6 F (36.4 C)  TempSrc: Oral Oral Oral Oral  SpO2: 99% 100% 99% 100%  Weight:      Height:       No intake or output data in the 24 hours ending 07/23/20 1345 Filed Weights   07/22/20 0822  Weight: 83.9 kg    Exam:  General: Appearance:     Overweight male in no acute  distress     Lungs:     respirations unlabored  Heart:    Normal heart rate. Normal rhythm. No murmurs, rubs, or gallops.   MS:   All extremities are intact.   Neurologic:   Awake, alert, oriented x 3. No apparent focal neurological           defect.     Data Reviewed:   I have personally reviewed following labs and imaging studies:  Labs: Labs show the following:   Basic Metabolic Panel: Recent Labs  Lab 07/22/20 0833 07/22/20 0833 07/22/20 0840 07/22/20 1607 07/23/20 0349  NA 138  --  139  --  141  K 4.6   < > 4.6  --  3.8  CL 103  --  104   --  107  CO2 26  --   --   --  27  GLUCOSE 314*  --  306*  --  110*  BUN 44*  --  43*  --  37*  CREATININE 4.02*  --  4.00* 3.84* 3.81*  CALCIUM 9.5  --   --   --  9.1   < > = values in this interval not displayed.   GFR Estimated Creatinine Clearance: 22.6 mL/min (A) (by C-G formula based on SCr of 3.81 mg/dL (H)). Liver Function Tests: Recent Labs  Lab 07/22/20 0833  AST 23  ALT 50*  ALKPHOS 84  BILITOT 0.8  PROT 6.7  ALBUMIN 3.8   No results for input(s): LIPASE, AMYLASE in the last 168 hours. No results for input(s): AMMONIA in the last 168 hours. Coagulation profile Recent Labs  Lab 07/22/20 0833  INR 0.9    CBC: Recent Labs  Lab 07/20/20 0825 07/22/20 0833 07/22/20 0840 07/22/20 1607 07/23/20 0349  WBC  --  7.3  --  7.6 6.1  NEUTROABS  --  4.9  --   --   --   HGB 11.9* 11.9* 12.6* 12.2* 11.2*  HCT  --  38.4* 37.0* 38.5* 35.3*  MCV  --  91.9  --  90.8 90.3  PLT  --  205  --  186 199   Cardiac Enzymes: No results for input(s): CKTOTAL, CKMB, CKMBINDEX, TROPONINI in the last 168 hours. BNP (last 3 results) No results for input(s): PROBNP in the last 8760 hours. CBG: Recent Labs  Lab 07/22/20 0835 07/22/20 1625 07/22/20 2126 07/23/20 0549 07/23/20 1111  GLUCAP 264* 198* 221* 93 255*   D-Dimer: No results for input(s): DDIMER in the last 72 hours. Hgb A1c: Recent Labs    07/22/20 0833  HGBA1C 9.6*   Lipid Profile: Recent Labs    07/23/20 0349  CHOL 164  HDL 78  LDLCALC 74  TRIG 60  CHOLHDL 2.1   Thyroid function studies: No results for input(s): TSH, T4TOTAL, T3FREE, THYROIDAB in the last 72 hours.  Invalid input(s): FREET3 Anemia work up: No results for input(s): VITAMINB12, FOLATE, FERRITIN, TIBC, IRON, RETICCTPCT in the last 72 hours. Sepsis Labs: Recent Labs  Lab 07/22/20 0833 07/22/20 1607 07/23/20 0349  WBC 7.3 7.6 6.1    Microbiology Recent Results (from the past 240 hour(s))  Respiratory Panel by RT PCR (Flu A&B,  Covid) - Nasopharyngeal Swab     Status: None   Collection Time: 07/22/20  8:58 AM   Specimen: Nasopharyngeal Swab  Result Value Ref Range Status   SARS Coronavirus 2 by RT PCR NEGATIVE NEGATIVE Final    Comment: (NOTE) SARS-CoV-2 target nucleic acids are NOT  DETECTED.  The SARS-CoV-2 RNA is generally detectable in upper respiratoy specimens during the acute phase of infection. The lowest concentration of SARS-CoV-2 viral copies this assay can detect is 131 copies/mL. A negative result does not preclude SARS-Cov-2 infection and should not be used as the sole basis for treatment or other patient management decisions. A negative result may occur with  improper specimen collection/handling, submission of specimen other than nasopharyngeal swab, presence of viral mutation(s) within the areas targeted by this assay, and inadequate number of viral copies (<131 copies/mL). A negative result must be combined with clinical observations, patient history, and epidemiological information. The expected result is Negative.  Fact Sheet for Patients:  PinkCheek.be  Fact Sheet for Healthcare Providers:  GravelBags.it  This test is no t yet approved or cleared by the [place] FDA and  has been authorized for detection and/or diagnosis of SARS-CoV-2 by FDA under an Emergency Use Authorization (EUA). This EUA will remain  in effect (meaning this test can be used) for the duration of the COVID-19 declaration under Section 564(b)(1) of the Act, 21 U.S.C. section 360bbb-3(b)(1), unless the authorization is terminated or revoked sooner.     Influenza A by PCR NEGATIVE NEGATIVE Final   Influenza B by PCR NEGATIVE NEGATIVE Final    Comment: (NOTE) The Xpert Xpress SARS-CoV-2/FLU/RSV assay is intended as an aid in  the diagnosis of influenza from Nasopharyngeal swab specimens and  should not be used as a sole basis for treatment. Nasal  washings and  aspirates are unacceptable for Xpert Xpress SARS-CoV-2/FLU/RSV  testing.  Fact Sheet for Patients: PinkCheek.be  Fact Sheet for Healthcare Providers: GravelBags.it  This test is not yet approved or cleared by the [place] FDA and  has been authorized for detection and/or diagnosis of SARS-CoV-2 by  FDA under an Emergency Use Authorization (EUA). This EUA will remain  in effect (meaning this test can be used) for the duration of the  Covid-19 declaration under Section 564(b)(1) of the Act, 21  U.S.C. section 360bbb-3(b)(1), unless the authorization is  terminated or revoked. Performed at [facility], [place] [address], [place],  [address]     Procedures and diagnostic studies:  CT HEAD WO CONTRAST  Result Date: 07/22/2020 CLINICAL DATA:  Neuro deficit, acute stroke suspected. Slurred speech. Hypertension. EXAM: CT HEAD WITHOUT CONTRAST TECHNIQUE: Contiguous axial images were obtained from the base of the skull through the vertex without intravenous contrast. COMPARISON:  None. FINDINGS: Brain: Hypodensity within the right corona radiata, compatible with age-indeterminate white matter infarct. Additional patchy white matter hypoattenuation, favored to reflect the sequela of chronic microvascular ischemic disease. No evidence of acute large vascular territory infarct. No acute hemorrhage. No hydrocephalus. No mass lesion or abnormal mass effect. Vascular: No hyperdense vessel or unexpected calcification. Calcific atherosclerosis. Skull: No acute fracture Sinuses/Orbits: Mild inferior right maxillary sinus mucosal thickening with opacification of left anterior ethmoid cell air cells and left frontal sinus. Other: No mastoid effusions. IMPRESSION: 1. Age indeterminate small white matter infarct involving the right corona radiata. No evidence of acute large vascular territory infarct. MRI could further  evaluate if clinically indicated. 2. Predominately left frontoethmoidal sinus disease. Correlate with signs/symptoms of sinusitis. Electronically Signed   By: Margaretha Sheffield MD   On: 07/22/2020 09:42   MR ANGIO HEAD WO CONTRAST  Result Date: 07/22/2020 CLINICAL DATA:  Stroke, follow-up. EXAM: MRA NECK WITHOUT CONTRAST MRA HEAD WITHOUT CONTRAST TECHNIQUE: Multiplanar and multiecho pulse sequences of the neck were obtained  without intravenous contrast. Angiographic images of the neck were obtained using MRA technique without intravenous contast.; Angiographic images of the Circle of Willis were obtained using MRA technique without intravenous contrast. COMPARISON:  07/22/2020 CT and MRI head. FINDINGS: MRA NECK FINDINGS There is no high-grade narrowing or focal aneurysm involving the bilateral carotid arteries. No evidence of dissection. The bilateral vertebral arteries are patent and demonstrate antegrade flow. Dominant right vertebral artery. No evidence of high-grade narrowing or focal aneurysm. MRA HEAD FINDINGS Anterior circulation: Patent normal caliber bilateral ICAs. Patent normal caliber anterior and middle cerebral arteries. No significant stenosis, proximal occlusion, aneurysm, or vascular malformation. Posterior circulation: Dominant right vertebral artery terminating as basilar. The left vertebral artery likely terminates as PICA. Dominant right AICA. Patent bilateral PCAs with fetal origin on the left. Patent right superior cerebellar artery. High-grade narrowing of the proximal to mid left superior cerebellar artery with non opacification distally. No aneurysm, or vascular malformation. Venous sinuses: No evidence of thrombosis. Anatomic variants: None of significance. IMPRESSION: High-grade narrowing of the proximal to mid left superior cerebellar artery with non-opacification distally. No high-grade narrowing, large vessel occlusion, dissection or aneurysm within the neck. Electronically  Signed   By: Primitivo Gauze M.D.   On: 07/22/2020 15:18   MR ANGIO NECK WO CONTRAST  Result Date: 07/22/2020 CLINICAL DATA:  Stroke, follow-up. EXAM: MRA NECK WITHOUT CONTRAST MRA HEAD WITHOUT CONTRAST TECHNIQUE: Multiplanar and multiecho pulse sequences of the neck were obtained without intravenous contrast. Angiographic images of the neck were obtained using MRA technique without intravenous contast.; Angiographic images of the Circle of Willis were obtained using MRA technique without intravenous contrast. COMPARISON:  07/22/2020 CT and MRI head. FINDINGS: MRA NECK FINDINGS There is no high-grade narrowing or focal aneurysm involving the bilateral carotid arteries. No evidence of dissection. The bilateral vertebral arteries are patent and demonstrate antegrade flow. Dominant right vertebral artery. No evidence of high-grade narrowing or focal aneurysm. MRA HEAD FINDINGS Anterior circulation: Patent normal caliber bilateral ICAs. Patent normal caliber anterior and middle cerebral arteries. No significant stenosis, proximal occlusion, aneurysm, or vascular malformation. Posterior circulation: Dominant right vertebral artery terminating as basilar. The left vertebral artery likely terminates as PICA. Dominant right AICA. Patent bilateral PCAs with fetal origin on the left. Patent right superior cerebellar artery. High-grade narrowing of the proximal to mid left superior cerebellar artery with non opacification distally. No aneurysm, or vascular malformation. Venous sinuses: No evidence of thrombosis. Anatomic variants: None of significance. IMPRESSION: High-grade narrowing of the proximal to mid left superior cerebellar artery with non-opacification distally. No high-grade narrowing, large vessel occlusion, dissection or aneurysm within the neck. Electronically Signed   By: Primitivo Gauze M.D.   On: 07/22/2020 15:18   MR BRAIN WO CONTRAST  Result Date: 07/22/2020 CLINICAL DATA:  Neuro deficit,  acute, stroke suspected. Additional history provided: Slurred speech, loss of balance Tuesday 10 19, uncontrolled hypertension. EXAM: MRI HEAD WITHOUT CONTRAST TECHNIQUE: Multiplanar, multiecho pulse sequences of the brain and surrounding structures were obtained without intravenous contrast. COMPARISON:  Prior head CT 07/22/2020. FINDINGS: Brain: Mild generalized cerebral atrophy. 13 mm focus of restricted diffusion within the left pons consistent with acute/early subacute infarction (series 3, image 15). Chronic lacunar infarcts within the bilateral cerebral white matter. Background mild multifocal T2/FLAIR hyperintensity within the cerebral white matter and pons is nonspecific, but compatible with chronic small vessel ischemic disease. No evidence of intracranial mass. No chronic intracranial blood products. No extra-axial fluid collection. No midline shift. Vascular: Expected proximal  arterial flow voids. Skull and upper cervical spine: Nonspecific heterogeneous marrow signal of the visualized upper cervical spine with regions of abnormal T1 hypointensity (series 5, image 12). Sinuses/Orbits: Visualized orbits show no acute finding. Complete T2 hyperintense opacification of the left frontal and anterior ethmoid sinuses. Mild mucosal thickening within the remainder of the ethmoid air cells and right greater than left maxillary sinuses. No significant mastoid effusion. IMPRESSION: 13 mm acute/early subacute infarct within the left pons. Chronic lacunar infarcts within the bilateral cerebral white matter. Background mild cerebral atrophy and mild chronic small vessel ischemic disease. Paranasal sinus disease as described. Most notably, there is complete T2 hyperintense opacification of the left frontal and anterior ethmoid sinuses. Heterogeneous marrow signal of the visualized upper cervical spine with regions of abnormal T1 hypointensity. These findings are nonspecific. Consider cervical spine MRI with contrast  for further evaluation, particularly if the patient has a history of known malignancy with the possibility of osseous metastases. Electronically Signed   By: Kellie Simmering DO   On: 07/22/2020 12:15   ECHOCARDIOGRAM COMPLETE  Result Date: 07/22/2020    ECHOCARDIOGRAM REPORT   Patient Name:   Louis Peters Date of Exam: 07/22/2020 Medical Rec #:  [id number]       Height:       70.0 in Accession #:    [id number]      Weight:       185.0 lb Date of Birth:  10-08-1965      BSA:          2.019 m Patient Age:    51 years        BP:           187/107 mmHg Patient Gender: M               HR:           69 bpm. Exam Location:  Inpatient Procedure: 2D Echo, Cardiac Doppler and Color Doppler Indications:    Stroke 434.91 / I163.9  History:        Patient has no prior history of Echocardiogram examinations.                 Risk Factors:Hypertension, Diabetes, Dyslipidemia and                 Non-Smoker.  Sonographer:    Vickie Epley RDCS Referring Phys: [id number] [place]  1. Left ventricular ejection fraction, by estimation, is 55 to 60%. The left ventricle has normal function. The left ventricle has no regional wall motion abnormalities. Left ventricular diastolic parameters are consistent with Grade I diastolic dysfunction (impaired relaxation). Elevated left atrial pressure.  2. Right ventricular systolic function is normal. The right ventricular size is normal. There is normal pulmonary artery systolic pressure.  3. The mitral valve is normal in structure. Mild mitral valve regurgitation. No evidence of mitral stenosis.  4. The aortic valve is normal in structure. Aortic valve regurgitation is not visualized. No aortic stenosis is present.  5. The inferior vena cava is normal in size with greater than 50% respiratory variability, suggesting right atrial pressure of 3 mmHg. Comparison(s): No prior Echocardiogram. Conclusion(s)/Recommendation(s): No intracardiac source of embolism detected on this  transthoracic study. A transesophageal echocardiogram is recommended to exclude cardiac source of embolism if clinically indicated. FINDINGS  Left Ventricle: Left ventricular ejection fraction, by estimation, is 55 to 60%. The left ventricle has normal function. The left ventricle has no regional wall motion abnormalities. The  left ventricular internal cavity size was normal in size. There is  no left ventricular hypertrophy. Left ventricular diastolic parameters are consistent with Grade I diastolic dysfunction (impaired relaxation). Elevated left atrial pressure. Right Ventricle: The right ventricular size is normal. No increase in right ventricular wall thickness. Right ventricular systolic function is normal. There is normal pulmonary artery systolic pressure. Left Atrium: Left atrial size was normal in size. Right Atrium: Right atrial size was normal in size. Pericardium: There is no evidence of pericardial effusion. Mitral Valve: The mitral valve is normal in structure. Mild mitral valve regurgitation. No evidence of mitral valve stenosis. Tricuspid Valve: The tricuspid valve is normal in structure. Tricuspid valve regurgitation is mild . No evidence of tricuspid stenosis. Aortic Valve: The aortic valve is normal in structure. Aortic valve regurgitation is not visualized. No aortic stenosis is present. Pulmonic Valve: The pulmonic valve was normal in structure. Pulmonic valve regurgitation is not visualized. No evidence of pulmonic stenosis. Aorta: The aortic root is normal in size and structure. Venous: The inferior vena cava is normal in size with greater than 50% respiratory variability, suggesting right atrial pressure of 3 mmHg. IAS/Shunts: No atrial level shunt detected by color flow Doppler.  LEFT VENTRICLE PLAX 2D LVIDd:         4.40 cm      Diastology LVIDs:         3.38 cm      LV e' medial:    3.24 cm/s LV PW:         0.94 cm      LV E/e' medial:  13.1 LV IVS:        0.96 cm      LV e' lateral:    4.46 cm/s LVOT diam:     2.20 cm      LV E/e' lateral: 9.5 LV SV:         67 LV SV Index:   33 LVOT Area:     3.80 cm  LV Volumes (MOD) LV vol d, MOD A2C: 118.0 ml LV vol d, MOD A4C: 132.0 ml LV vol s, MOD A2C: 50.0 ml LV vol s, MOD A4C: 58.8 ml LV SV MOD A2C:     68.0 ml LV SV MOD A4C:     132.0 ml LV SV MOD BP:      74.4 ml RIGHT VENTRICLE RV S prime:     12.90 cm/s TAPSE (M-mode): 2.1 cm LEFT ATRIUM             Index       RIGHT ATRIUM           Index LA diam:        3.40 cm 1.68 cm/m  RA Area:     12.70 cm LA Vol (A2C):   38.7 ml 19.17 ml/m RA Volume:   29.10 ml  14.41 ml/m LA Vol (A4C):   40.2 ml 19.91 ml/m LA Biplane Vol: 42.4 ml 21.00 ml/m  AORTIC VALVE LVOT Vmax:   85.50 cm/s LVOT Vmean:  54.700 cm/s LVOT VTI:    0.177 m  AORTA Ao Root diam: 3.40 cm MITRAL VALVE MV Area (PHT): 3.37 cm    SHUNTS MV Decel Time: 225 msec    Systemic VTI:  0.18 m MV E velocity: 42.40 cm/s  Systemic Diam: 2.20 cm MV A velocity: 62.10 cm/s MV E/A ratio:  0.68 Ena Dawley MD Electronically signed by Ena Dawley MD Signature Date/Time: 07/22/2020/6:42:45 PM    Final  Medications:   . amLODipine  10 mg Oral Daily  . aspirin EC  81 mg Oral Daily  . atorvastatin  80 mg Oral Daily  . clopidogrel  75 mg Oral Daily  . heparin  5,000 Units Subcutaneous Q8H  . hydrALAZINE  50 mg Oral TID  . insulin aspart  0-5 Units Subcutaneous QHS  . insulin aspart  0-9 Units Subcutaneous TID WC  . insulin glargine  7 Units Subcutaneous Daily  . losartan  100 mg Oral Daily   Continuous Infusions:   LOS: 1 day   Geradine Girt  [facility]   How to contact the [facility] Attending or Consulting provider [place] or covering provider during after hours [place], for this patient?  1. Check the care team in [facility] and look for a) attending/consulting [facility] provider listed and b) the [facility] team listed 2. Log into [email or web address] and use 's universal password to access. If you do not have the password, please contact  the hospital operator. 3. Locate the [facility] provider you are looking for under [facility] and page to a number that you can be directly reached. 4. If you still have difficulty reaching the provider, please page the [facility] (Director on Call) for the Hospitalists listed on amion for assistance.  07/23/2020, 1:45 PM

## 2020-07-23 NOTE — Evaluation (Signed)
Physical Therapy Evaluation - One-time  Patient Details Name: Louis Peters MRN: [id number] DOB: Sep 28, 1966 Today's Date: 07/23/2020   History of Present Illness  54 y.o. male with PMH of uncontrolled hypertension, uncontrolled hypercholesterolemia, uncontrolled type I diabetes with retinopathy, chronic kidney disease, retinal detachment s/p repair. Pt admitted to [facility] on 10/21 for slurred speech, 2 day history of gait imbalance. 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  Clinical Impression   Pt presents with [facility] strength with mild LE strength imbalance R (4+/5 throughout) vs L (5/5), very mild RLE incoordination and incoordinated R finger-nose, WFL mobility including gait and step navigation, and WFL activity tolerance. Pt ambulated great hallway distance, with no evidence of imbalance even when challenged via DGI balance assessment. Although pt with mild R incoordination and very mild RLE weakness vs L, this is not functionally observed during mobility and pt reports feeling at baseline. No PT follow up warranted, and PT to sign off. No acute PT needs, pt mobilizing independently in his room upon PT exit.     Follow Up Recommendations No PT follow up    Equipment Recommendations  None recommended by PT    Recommendations for Other Services       Precautions / Restrictions Precautions Precautions: Fall Restrictions Weight Bearing Restrictions: No      Mobility  Bed Mobility Overal bed mobility: Modified Independent;Independent             General bed mobility comments: up in room upon PT arrival to room    Transfers Overall transfer level: Independent Equipment used: None             General transfer comment: no physical assist, WNL  Ambulation/Gait Ambulation/Gait assistance: Supervision Gait Distance (Feet): 450 Feet Assistive device: None Gait Pattern/deviations: Step-through pattern;WFL(Within Functional Limits) Gait velocity: WFL   General  Gait Details: WFL gait, even when PT challenging pt's dynamic standing balance  Stairs            Wheelchair Mobility    Modified [name] (Stroke Patients Only) Modified [name] (Stroke Patients Only) Pre-Morbid [name] Score: No symptoms Modified [name]: No significant disability     Balance Overall balance assessment: Modified Independent                               Standardized Balance Assessment Standardized Balance Assessment : Dynamic Gait Index   Dynamic Gait Index Level Surface: Normal Change in Gait Speed: Normal Gait with Horizontal Head Turns: Normal Gait with Vertical Head Turns: Normal Gait and Pivot Turn: Normal Step Over Obstacle: Normal Step Around Obstacles: Normal Steps: Mild Impairment Total Score: 23       Pertinent Vitals/Pain Pain Assessment: No/denies pain    Home Living Family/patient expects to be discharged to:: Private residence Living Arrangements: Spouse/significant other;Children Available Help at Discharge: Family;Available PRN/intermittently Type of Home: House Home Access: Stairs to enter Entrance Stairs-Rails: Right;Left Entrance Stairs-Number of Steps: 5 Home Layout: Two level;Able to live on main level with bedroom/bathroom Home Equipment: None      Prior Function Level of Independence: Independent         Comments: Pt works in Teacher, adult education care, wife and 2 kids live  with him     Hand Dominance   Dominant Hand: Left    Extremity/Trunk Assessment   Upper Extremity Assessment Upper Extremity Assessment: Defer to OT evaluation    Lower Extremity Assessment Lower Extremity Assessment: RLE deficits/detail RLE  Deficits / Details: very mild RLE incoordination as assessed via heel-shin       Communication   Communication: No difficulties  Cognition Arousal/Alertness: Awake/alert Behavior During Therapy: WFL for tasks assessed/performed Overall Cognitive Status: Within Functional Limits for tasks  assessed                                 General Comments: Slight lack of insight into deficits, when RLE slightly weaker than L pt states "it's because I'm [place]"      General Comments      Exercises Other Exercises Other Exercises: BE FAST - s/s of CVA reviewed with pt   Assessment/Plan    PT Assessment Patent does not need any further PT services  PT Problem List         PT Treatment Interventions      PT Goals (Current goals can be found in the Care Plan section)  Acute Rehab PT Goals Patient Stated Goal: go home PT Goal Formulation: With patient Time For Goal Achievement: 07/23/20 Potential to Achieve Goals: Good    Frequency     Barriers to discharge        Co-evaluation               AM-PAC PT "6 Clicks" Mobility  Outcome Measure Help needed turning from your back to your side while in a flat bed without using bedrails?: None Help needed moving from lying on your back to sitting on the side of a flat bed without using bedrails?: None Help needed moving to and from a bed to a chair (including a wheelchair)?: None Help needed standing up from a chair using your arms (e.g., wheelchair or bedside chair)?: None Help needed to walk in hospital room?: None Help needed climbing 3-5 steps with a railing? : None 6 Click Score: 24    End of Session   Activity Tolerance: Patient tolerated treatment well Patient left: in chair;with call bell/phone within reach Nurse Communication: Mobility status PT Visit Diagnosis: Other abnormalities of gait and mobility (R26.89)    Time: 2355-7322 PT Time Calculation (min) (ACUTE ONLY): 11 min   Charges:   PT Evaluation $PT Eval Low Complexity: 1 Low         Kacee Koren E, PT Acute Rehabilitation Services Pager [phone]  Office [phone]   Inara Dike D Elonda Husky 07/23/2020, 2:18 PM

## 2020-07-23 NOTE — Progress Notes (Signed)
STROKE TEAM PROGRESS NOTE   INTERVAL HISTORY No family at bedside. Dr. Eliseo Squires just saw him. He is up in the room, has walked with PT/OT, pretty good. He said he may be still feel a little unsteady but pretty much back to baseline. He understood that his HTN and DM not in good control and willing to work on it.   Vitals:   07/22/20 1935 07/22/20 2159 07/23/20 0039 07/23/20 0419  BP: (!) 202/88 (!) 192/98 (!) 159/82 (!) 183/93  Pulse: 68 72 62 63  Resp: 16 15 18 15   Temp: 98.2 F (36.8 C) 98.4 F (36.9 C) 98.5 F (36.9 C) 98.2 F (36.8 C)  TempSrc: Oral Oral Oral Oral  SpO2: 97% 99% 100% 99%  Weight:      Height:       CBC:  Recent Labs  Lab 07/22/20 0833 07/22/20 0840 07/22/20 1607 07/23/20 0349  WBC 7.3   < > 7.6 6.1  NEUTROABS 4.9  --   --   --   HGB 11.9*   < > 12.2* 11.2*  HCT 38.4*   < > 38.5* 35.3*  MCV 91.9   < > 90.8 90.3  PLT 205   < > 186 199   < > = values in this interval not displayed.   Basic Metabolic Panel:  Recent Labs  Lab 07/22/20 0833 07/22/20 0833 07/22/20 0840 07/22/20 0840 07/22/20 1607 07/23/20 0349  NA 138   < > 139  --   --  141  K 4.6   < > 4.6  --   --  3.8  CL 103   < > 104  --   --  107  CO2 26  --   --   --   --  27  GLUCOSE 314*   < > 306*  --   --  110*  BUN 44*   < > 43*  --   --  37*  CREATININE 4.02*   < > 4.00*   < > 3.84* 3.81*  CALCIUM 9.5  --   --   --   --  9.1   < > = values in this interval not displayed.   Lipid Panel:  Recent Labs  Lab 07/23/20 0349  CHOL 164  TRIG 60  HDL 78  CHOLHDL 2.1  VLDL 12  LDLCALC 74   HgbA1c:  Recent Labs  Lab 07/22/20 0833  HGBA1C 9.6*   Urine Drug Screen:  Recent Labs  Lab 07/22/20 1344  LABOPIA NONE DETECTED  COCAINSCRNUR NONE DETECTED  LABBENZ NONE DETECTED  AMPHETMU NONE DETECTED  THCU NONE DETECTED  LABBARB NONE DETECTED    Alcohol Level No results for input(s): ETH in the last 168 hours.  IMAGING past 24 hours CT HEAD WO CONTRAST  Result Date:  07/22/2020 CLINICAL DATA:  Neuro deficit, acute stroke suspected. Slurred speech. Hypertension. EXAM: CT HEAD WITHOUT CONTRAST TECHNIQUE: Contiguous axial images were obtained from the base of the skull through the vertex without intravenous contrast. COMPARISON:  None. FINDINGS: Brain: Hypodensity within the right corona radiata, compatible with age-indeterminate white matter infarct. Additional patchy white matter hypoattenuation, favored to reflect the sequela of chronic microvascular ischemic disease. No evidence of acute large vascular territory infarct. No acute hemorrhage. No hydrocephalus. No mass lesion or abnormal mass effect. Vascular: No hyperdense vessel or unexpected calcification. Calcific atherosclerosis. Skull: No acute fracture Sinuses/Orbits: Mild inferior right maxillary sinus mucosal thickening with opacification of left anterior ethmoid cell air cells  and left frontal sinus. Other: No mastoid effusions. IMPRESSION: 1. Age indeterminate small white matter infarct involving the right corona radiata. No evidence of acute large vascular territory infarct. MRI could further evaluate if clinically indicated. 2. Predominately left frontoethmoidal sinus disease. Correlate with signs/symptoms of sinusitis. Electronically Signed   By: Margaretha Sheffield MD   On: 07/22/2020 09:42   MR ANGIO HEAD WO CONTRAST  Result Date: 07/22/2020 CLINICAL DATA:  Stroke, follow-up. EXAM: MRA NECK WITHOUT CONTRAST MRA HEAD WITHOUT CONTRAST TECHNIQUE: Multiplanar and multiecho pulse sequences of the neck were obtained without intravenous contrast. Angiographic images of the neck were obtained using MRA technique without intravenous contast.; Angiographic images of the Circle of Willis were obtained using MRA technique without intravenous contrast. COMPARISON:  07/22/2020 CT and MRI head. FINDINGS: MRA NECK FINDINGS There is no high-grade narrowing or focal aneurysm involving the bilateral carotid arteries. No evidence  of dissection. The bilateral vertebral arteries are patent and demonstrate antegrade flow. Dominant right vertebral artery. No evidence of high-grade narrowing or focal aneurysm. MRA HEAD FINDINGS Anterior circulation: Patent normal caliber bilateral ICAs. Patent normal caliber anterior and middle cerebral arteries. No significant stenosis, proximal occlusion, aneurysm, or vascular malformation. Posterior circulation: Dominant right vertebral artery terminating as basilar. The left vertebral artery likely terminates as PICA. Dominant right AICA. Patent bilateral PCAs with fetal origin on the left. Patent right superior cerebellar artery. High-grade narrowing of the proximal to mid left superior cerebellar artery with non opacification distally. No aneurysm, or vascular malformation. Venous sinuses: No evidence of thrombosis. Anatomic variants: None of significance. IMPRESSION: High-grade narrowing of the proximal to mid left superior cerebellar artery with non-opacification distally. No high-grade narrowing, large vessel occlusion, dissection or aneurysm within the neck. Electronically Signed   By: Primitivo Gauze M.D.   On: 07/22/2020 15:18   MR ANGIO NECK WO CONTRAST  Result Date: 07/22/2020 CLINICAL DATA:  Stroke, follow-up. EXAM: MRA NECK WITHOUT CONTRAST MRA HEAD WITHOUT CONTRAST TECHNIQUE: Multiplanar and multiecho pulse sequences of the neck were obtained without intravenous contrast. Angiographic images of the neck were obtained using MRA technique without intravenous contast.; Angiographic images of the Circle of Willis were obtained using MRA technique without intravenous contrast. COMPARISON:  07/22/2020 CT and MRI head. FINDINGS: MRA NECK FINDINGS There is no high-grade narrowing or focal aneurysm involving the bilateral carotid arteries. No evidence of dissection. The bilateral vertebral arteries are patent and demonstrate antegrade flow. Dominant right vertebral artery. No evidence of  high-grade narrowing or focal aneurysm. MRA HEAD FINDINGS Anterior circulation: Patent normal caliber bilateral ICAs. Patent normal caliber anterior and middle cerebral arteries. No significant stenosis, proximal occlusion, aneurysm, or vascular malformation. Posterior circulation: Dominant right vertebral artery terminating as basilar. The left vertebral artery likely terminates as PICA. Dominant right AICA. Patent bilateral PCAs with fetal origin on the left. Patent right superior cerebellar artery. High-grade narrowing of the proximal to mid left superior cerebellar artery with non opacification distally. No aneurysm, or vascular malformation. Venous sinuses: No evidence of thrombosis. Anatomic variants: None of significance. IMPRESSION: High-grade narrowing of the proximal to mid left superior cerebellar artery with non-opacification distally. No high-grade narrowing, large vessel occlusion, dissection or aneurysm within the neck. Electronically Signed   By: Primitivo Gauze M.D.   On: 07/22/2020 15:18   MR BRAIN WO CONTRAST  Result Date: 07/22/2020 CLINICAL DATA:  Neuro deficit, acute, stroke suspected. Additional history provided: Slurred speech, loss of balance Tuesday 10 19, uncontrolled hypertension. EXAM: MRI HEAD WITHOUT CONTRAST TECHNIQUE:  Multiplanar, multiecho pulse sequences of the brain and surrounding structures were obtained without intravenous contrast. COMPARISON:  Prior head CT 07/22/2020. FINDINGS: Brain: Mild generalized cerebral atrophy. 13 mm focus of restricted diffusion within the left pons consistent with acute/early subacute infarction (series 3, image 15). Chronic lacunar infarcts within the bilateral cerebral white matter. Background mild multifocal T2/FLAIR hyperintensity within the cerebral white matter and pons is nonspecific, but compatible with chronic small vessel ischemic disease. No evidence of intracranial mass. No chronic intracranial blood products. No extra-axial  fluid collection. No midline shift. Vascular: Expected proximal arterial flow voids. Skull and upper cervical spine: Nonspecific heterogeneous marrow signal of the visualized upper cervical spine with regions of abnormal T1 hypointensity (series 5, image 12). Sinuses/Orbits: Visualized orbits show no acute finding. Complete T2 hyperintense opacification of the left frontal and anterior ethmoid sinuses. Mild mucosal thickening within the remainder of the ethmoid air cells and right greater than left maxillary sinuses. No significant mastoid effusion. IMPRESSION: 13 mm acute/early subacute infarct within the left pons. Chronic lacunar infarcts within the bilateral cerebral white matter. Background mild cerebral atrophy and mild chronic small vessel ischemic disease. Paranasal sinus disease as described. Most notably, there is complete T2 hyperintense opacification of the left frontal and anterior ethmoid sinuses. Heterogeneous marrow signal of the visualized upper cervical spine with regions of abnormal T1 hypointensity. These findings are nonspecific. Consider cervical spine MRI with contrast for further evaluation, particularly if the patient has a history of known malignancy with the possibility of osseous metastases. Electronically Signed   By: Kellie Simmering DO   On: 07/22/2020 12:15   ECHOCARDIOGRAM COMPLETE  Result Date: 07/22/2020    ECHOCARDIOGRAM REPORT   Patient Name:   Louis Peters Date of Exam: 07/22/2020 Medical Rec #:  [id number]       Height:       70.0 in Accession #:    [id number]      Weight:       185.0 lb Date of Birth:  02/14/1966      BSA:          2.019 m Patient Age:    33 years        BP:           187/107 mmHg Patient Gender: M               HR:           69 bpm. Exam Location:  Inpatient Procedure: 2D Echo, Cardiac Doppler and Color Doppler Indications:    Stroke 434.91 / I163.9  History:        Patient has no prior history of Echocardiogram examinations.                 Risk  Factors:Hypertension, Diabetes, Dyslipidemia and                 Non-Smoker.  Sonographer:    Vickie Epley RDCS Referring Phys: [id number] [place]  1. Left ventricular ejection fraction, by estimation, is 55 to 60%. The left ventricle has normal function. The left ventricle has no regional wall motion abnormalities. Left ventricular diastolic parameters are consistent with Grade I diastolic dysfunction (impaired relaxation). Elevated left atrial pressure.  2. Right ventricular systolic function is normal. The right ventricular size is normal. There is normal pulmonary artery systolic pressure.  3. The mitral valve is normal in structure. Mild mitral valve regurgitation. No evidence of mitral stenosis.  4. The aortic valve  is normal in structure. Aortic valve regurgitation is not visualized. No aortic stenosis is present.  5. The inferior vena cava is normal in size with greater than 50% respiratory variability, suggesting right atrial pressure of 3 mmHg. Comparison(s): No prior Echocardiogram. Conclusion(s)/Recommendation(s): No intracardiac source of embolism detected on this transthoracic study. A transesophageal echocardiogram is recommended to exclude cardiac source of embolism if clinically indicated. FINDINGS  Left Ventricle: Left ventricular ejection fraction, by estimation, is 55 to 60%. The left ventricle has normal function. The left ventricle has no regional wall motion abnormalities. The left ventricular internal cavity size was normal in size. There is  no left ventricular hypertrophy. Left ventricular diastolic parameters are consistent with Grade I diastolic dysfunction (impaired relaxation). Elevated left atrial pressure. Right Ventricle: The right ventricular size is normal. No increase in right ventricular wall thickness. Right ventricular systolic function is normal. There is normal pulmonary artery systolic pressure. Left Atrium: Left atrial size was normal in size. Right Atrium:  Right atrial size was normal in size. Pericardium: There is no evidence of pericardial effusion. Mitral Valve: The mitral valve is normal in structure. Mild mitral valve regurgitation. No evidence of mitral valve stenosis. Tricuspid Valve: The tricuspid valve is normal in structure. Tricuspid valve regurgitation is mild . No evidence of tricuspid stenosis. Aortic Valve: The aortic valve is normal in structure. Aortic valve regurgitation is not visualized. No aortic stenosis is present. Pulmonic Valve: The pulmonic valve was normal in structure. Pulmonic valve regurgitation is not visualized. No evidence of pulmonic stenosis. Aorta: The aortic root is normal in size and structure. Venous: The inferior vena cava is normal in size with greater than 50% respiratory variability, suggesting right atrial pressure of 3 mmHg. IAS/Shunts: No atrial level shunt detected by color flow Doppler.  LEFT VENTRICLE PLAX 2D LVIDd:         4.40 cm      Diastology LVIDs:         3.38 cm      LV e' medial:    3.24 cm/s LV PW:         0.94 cm      LV E/e' medial:  13.1 LV IVS:        0.96 cm      LV e' lateral:   4.46 cm/s LVOT diam:     2.20 cm      LV E/e' lateral: 9.5 LV SV:         67 LV SV Index:   33 LVOT Area:     3.80 cm  LV Volumes (MOD) LV vol d, MOD A2C: 118.0 ml LV vol d, MOD A4C: 132.0 ml LV vol s, MOD A2C: 50.0 ml LV vol s, MOD A4C: 58.8 ml LV SV MOD A2C:     68.0 ml LV SV MOD A4C:     132.0 ml LV SV MOD BP:      74.4 ml RIGHT VENTRICLE RV S prime:     12.90 cm/s TAPSE (M-mode): 2.1 cm LEFT ATRIUM             Index       RIGHT ATRIUM           Index LA diam:        3.40 cm 1.68 cm/m  RA Area:     12.70 cm LA Vol (A2C):   38.7 ml 19.17 ml/m RA Volume:   29.10 ml  14.41 ml/m LA Vol (A4C):   40.2 ml 19.91 ml/m  LA Biplane Vol: 42.4 ml 21.00 ml/m  AORTIC VALVE LVOT Vmax:   85.50 cm/s LVOT Vmean:  54.700 cm/s LVOT VTI:    0.177 m  AORTA Ao Root diam: 3.40 cm MITRAL VALVE MV Area (PHT): 3.37 cm    SHUNTS MV Decel Time: 225  msec    Systemic VTI:  0.18 m MV E velocity: 42.40 cm/s  Systemic Diam: 2.20 cm MV A velocity: 62.10 cm/s MV E/A ratio:  0.68 Ena Dawley MD Electronically signed by Ena Dawley MD Signature Date/Time: 07/22/2020/6:42:45 PM    Final     PHYSICAL EXAM  Temp:  [97.6 F (36.4 C)-98.6 F (37 C)] 97.6 F (36.4 C) (10/22 1132) Pulse Rate:  [62-73] 73 (10/22 1132) Resp:  [15-19] 19 (10/22 1132) BP: (119-210)/(82-106) 194/106 (10/22 1132) SpO2:  [97 %-100 %] 100 % (10/22 1132)  General - Well nourished, well developed, in no apparent distress.  Ophthalmologic - fundi not visualized due to noncooperation.  Cardiovascular - Regular rhythm and rate.  Mental Status -  Level of arousal and orientation to time, place, and person were intact. Language including expression, naming, repetition, comprehension was assessed and found intact. Attention span and concentration were normal. Fund of Knowledge was assessed and was intact.  Cranial Nerves II - XII - II - Visual field intact OU. III, IV, VI - Extraocular movements intact. V - Facial sensation intact bilaterally. VII - Facial movement intact bilaterally. VIII - Hearing & vestibular intact bilaterally. X - Palate elevates symmetrically. XI - Chin turning & shoulder shrug intact bilaterally. XII - Tongue protrusion intact.  Motor Strength - The patient's strength was normal in all extremities and pronator drift was absent.  Bulk was normal and fasciculations were absent.   Motor Tone - Muscle tone was assessed at the neck and appendages and was normal.  Reflexes - The patient's reflexes were symmetrical in all extremities and he had no pathological reflexes.  Sensory - Light touch, temperature/pinprick were assessed and were symmetrical.    Coordination - The patient had normal movements in the hands and feet with no ataxia or dysmetria.  Tremor was absent.  Gait and Station - deferred.   ASSESSMENT/PLAN Louis Peters  is a 54 y.o. male with history of f retinal detachment, uncontrolled hypertension, uncontrolled hypercholesterolemia, uncontrolled diabetes, chronic kidney disease presenting with 1 d hx unsteady gait and slurred speech. He had been noncompliant w/ meds at home PTA.   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 Sinus dz  MRI  L pontine infarct. Old B lacunes and Small vessel disease.   MRA head proximal to mid L SCA high-grade narrowing   MRA neck Unremarkable   2D Echo EF 55-60%. No source of embolus   LDL 74  HgbA1c 9.6  UDS neg  VTE prophylaxis - Heparin 5000 units sq tid   No antithrombotic prior to admission, now on aspirin 81 mg daily and clopidogrel 75 mg daily. Continue DAPT x 3 weeks then aspirin alone.    Therapy recommendations:  none   Disposition:  pending   Hypertensive Urgency  Home BP meds - norvasc, hydralazine and losartan  BP 241/109 on arrival  BP still at high end  Resumed norvasc and losartan . Gradually normalized BP in 2-3 days . Long-term BP goal normotensive  Hyperlipidemia  Home meds:  lipitor 40  LDL 74, goal < 70  Now on lipitor 80  Continue statin at discharge  Diabetes type I Uncontrolled w/ neuropathy/nephropathy/reinopathy  HgbA1c 9.6, goal < 7.0  CBGs  SSI  Hyperglycemia  Close PCP follow up and recommend endocrinology referral  Other Stroke Risk Factors  ETOH use, advised to drink no more than 2 drink(s) a day  Other Active Problems  AKI on CKD stage IV, Cre 4.00->3.81  Hospital day # 1  Neurology will sign off. Please call with questions. Pt will follow up with stroke clinic NP at [facility] in about 4 weeks. Thanks for the consult.  Rosalin Hawking, MD PhD Stroke Neurology 07/23/2020 2:31 PM   To contact Stroke Continuity provider, please refer to [email or web address] After hours, contact General Neurology

## 2020-07-24 LAB — GLUCOSE, CAPILLARY
Glucose-Capillary: 114 mg/dL — ABNORMAL HIGH (ref 70–99)
Glucose-Capillary: 283 mg/dL — ABNORMAL HIGH (ref 70–99)

## 2020-07-24 LAB — HEMOGLOBIN A1C
Hgb A1c MFr Bld: 9.3 % — ABNORMAL HIGH (ref 4.8–5.6)
Mean Plasma Glucose: 220 mg/dL

## 2020-07-24 MED ORDER — ATORVASTATIN CALCIUM 80 MG PO TABS
80.0000 mg | ORAL_TABLET | Freq: Every day | ORAL | 0 refills | Status: DC
Start: 2020-07-24 — End: 2023-09-01

## 2020-07-24 MED ORDER — ASPIRIN 81 MG PO TBEC
81.0000 mg | DELAYED_RELEASE_TABLET | Freq: Every day | ORAL | 11 refills | Status: DC
Start: 2020-07-24 — End: 2023-09-01

## 2020-07-24 MED ORDER — CLOPIDOGREL BISULFATE 75 MG PO TABS
75.0000 mg | ORAL_TABLET | Freq: Every day | ORAL | 0 refills | Status: DC
Start: 2020-07-24 — End: 2020-08-25

## 2020-07-24 NOTE — Discharge Summary (Signed)
Physician Discharge Summary  Louis Peters YFV:[id number] DOB: 1966-06-18 DOA: 07/22/2020  PCP: Peters, Louis date: 07/22/2020 Discharge date: 07/24/2020  Admitted From: home Discharge disposition: home   Recommendations for Outpatient Follow-Up:   1. Referral to neurology 2. Needs better BP control and blood sugar control   Discharge Diagnosis:   Principal Problem:   Ischemic stroke ([place]) Active Problems:   Diabetes mellitus type 1, uncomplicated ([place])   Hypercholesterolemia   Acute kidney injury superimposed on chronic kidney disease ([place])   Hypertensive urgency    Discharge Condition: Improved.  Diet recommendation: Low sodium, heart healthy.  Carbohydrate-modified.    Wound care: None.  Code status: Full.   History of Present Illness:   Louis Peters is a 54 y.o. male with medical history significant of hypertension, hyperlipidemia, type 1 diabetes mellitus, CKD stage III, diabetic retinopathy/neuropathy/nephropathy presents to emergency department for evaluation of slurred speech and gait instability since 2 days.  Patient tells me that he has slurred speech and problem with his gait since 2 days.  He denies any other symptoms such as headache, blurry vision, lightheadedness, dizziness, chest pain, shortness of breath, palpitation, leg swelling, numbness weakness tingling sensation in hands or feet, head trauma, seizures, loss of consciousness, facial drop, previous history of stroke.  Reports that he has not been taking his antihypertensive medication since 3 to 4 days because antihypertensive medications makes him dizzy. Lives with his wife at home.  No history of smoking, alcohol, illicit drug use.  He is fully vaccinated against COVID-19.  Hospital Course by Problem:   Ischemic stroke: -Patient presented with slurred speech and difficulty walking since 2 days.  -MRI: 13 mm acute/early subacute infarct  within the left pons. -will need ASA plus plavix for 3 weeks then ASA alone -echo: No intracardiac source of embolism detected on this transthoracic study -Neurology consult appreciated -PT/OT with no follow up  Hypertensive urgency: -Secondary to noncompliance with medications. -Blood pressure 240/117 upon arrival. Labetalol was given in ED. Blood pressure improved.  -resume home meds and adjust for better control  AKI on CKD stage IV: -BUN: 44, creatinine: 4.02, GFR: 17(baseline creatinine: 3.68, GFR: 20-22) -follow with Dr. 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:  -A1c: 9.6  -resume home meds -goal HgbA1c <7  Hyperlipidemia:  -LDL 73  -statin per protocol for goal <70 -statin at a higher dose   Medical Consultants:   neurology   Discharge Exam:   Vitals:   07/24/20 0040 07/24/20 0532  BP: 137/78 (!) 159/89  Pulse:  70  Resp: 15 18  Temp: 98.4 F (36.9 C) 97.9 F (36.6 C)  SpO2: 98% 97%   Vitals:   07/23/20 1638 07/23/20 2015 07/24/20 0040 07/24/20 0532  BP: 137/72 (!) 159/83 137/78 (!) 159/89  Pulse: 84 79  70  Resp: _0 Temp: 98.5 F (36.9 C) 98.7 F (37.1 C) 98.4 F (36.9 C) 97.9 F (36.6 C)  TempSrc: Oral Oral Oral Oral  SpO2: 100% 98% 98% 97%  Weight:      Height:        General exam: Appears calm and comfortable.  The results of significant diagnostics from this hospitalization (including imaging, microbiology, ancillary and laboratory) are listed below for reference.     Procedures and Diagnostic Studies:   CT HEAD WO CONTRAST  Result Date: 07/22/2020 CLINICAL DATA:  Neuro deficit, acute stroke suspected. Slurred speech. Hypertension.  EXAM: CT HEAD WITHOUT CONTRAST TECHNIQUE: Contiguous axial images were obtained from the base of the skull through the vertex without intravenous contrast. COMPARISON:  None. FINDINGS: Brain: Hypodensity within the right corona radiata, compatible with  age-indeterminate white matter infarct. Additional patchy white matter hypoattenuation, favored to reflect the sequela of chronic microvascular ischemic disease. No evidence of acute large vascular territory infarct. No acute hemorrhage. No hydrocephalus. No mass lesion or abnormal mass effect. Vascular: No hyperdense vessel or unexpected calcification. Calcific atherosclerosis. Skull: No acute fracture Sinuses/Orbits: Mild inferior right maxillary sinus mucosal thickening with opacification of left anterior ethmoid cell air cells and left frontal sinus. Other: No mastoid effusions. IMPRESSION: 1. Age indeterminate small white matter infarct involving the right corona radiata. No evidence of acute large vascular territory infarct. MRI could further evaluate if clinically indicated. 2. Predominately left frontoethmoidal sinus disease. Correlate with signs/symptoms of sinusitis. Electronically Signed   By: Margaretha Sheffield MD   On: 07/22/2020 09:42   MR ANGIO HEAD WO CONTRAST  Result Date: 07/22/2020 CLINICAL DATA:  Stroke, follow-up. EXAM: MRA NECK WITHOUT CONTRAST MRA HEAD WITHOUT CONTRAST TECHNIQUE: Multiplanar and multiecho pulse sequences of the neck were obtained without intravenous contrast. Angiographic images of the neck were obtained using MRA technique without intravenous contast.; Angiographic images of the Circle of Willis were obtained using MRA technique without intravenous contrast. COMPARISON:  07/22/2020 CT and MRI head. FINDINGS: MRA NECK FINDINGS There is no high-grade narrowing or focal aneurysm involving the bilateral carotid arteries. No evidence of dissection. The bilateral vertebral arteries are patent and demonstrate antegrade flow. Dominant right vertebral artery. No evidence of high-grade narrowing or focal aneurysm. MRA HEAD FINDINGS Anterior circulation: Patent normal caliber bilateral ICAs. Patent normal caliber anterior and middle cerebral arteries. No significant stenosis,  proximal occlusion, aneurysm, or vascular malformation. Posterior circulation: Dominant right vertebral artery terminating as basilar. The left vertebral artery likely terminates as PICA. Dominant right AICA. Patent bilateral PCAs with fetal origin on the left. Patent right superior cerebellar artery. High-grade narrowing of the proximal to mid left superior cerebellar artery with non opacification distally. No aneurysm, or vascular malformation. Venous sinuses: No evidence of thrombosis. Anatomic variants: None of significance. IMPRESSION: High-grade narrowing of the proximal to mid left superior cerebellar artery with non-opacification distally. No high-grade narrowing, large vessel occlusion, dissection or aneurysm within the neck. Electronically Signed   By: Primitivo Gauze M.D.   On: 07/22/2020 15:18   MR ANGIO NECK WO CONTRAST  Result Date: 07/22/2020 CLINICAL DATA:  Stroke, follow-up. EXAM: MRA NECK WITHOUT CONTRAST MRA HEAD WITHOUT CONTRAST TECHNIQUE: Multiplanar and multiecho pulse sequences of the neck were obtained without intravenous contrast. Angiographic images of the neck were obtained using MRA technique without intravenous contast.; Angiographic images of the Circle of Willis were obtained using MRA technique without intravenous contrast. COMPARISON:  07/22/2020 CT and MRI head. FINDINGS: MRA NECK FINDINGS There is no high-grade narrowing or focal aneurysm involving the bilateral carotid arteries. No evidence of dissection. The bilateral vertebral arteries are patent and demonstrate antegrade flow. Dominant right vertebral artery. No evidence of high-grade narrowing or focal aneurysm. MRA HEAD FINDINGS Anterior circulation: Patent normal caliber bilateral ICAs. Patent normal caliber anterior and middle cerebral arteries. No significant stenosis, proximal occlusion, aneurysm, or vascular malformation. Posterior circulation: Dominant right vertebral artery terminating as basilar. The left  vertebral artery likely terminates as PICA. Dominant right AICA. Patent bilateral PCAs with fetal origin on the left. Patent right superior cerebellar artery. High-grade narrowing of  the proximal to mid left superior cerebellar artery with non opacification distally. No aneurysm, or vascular malformation. Venous sinuses: No evidence of thrombosis. Anatomic variants: None of significance. IMPRESSION: High-grade narrowing of the proximal to mid left superior cerebellar artery with non-opacification distally. No high-grade narrowing, large vessel occlusion, dissection or aneurysm within the neck. Electronically Signed   By: Primitivo Gauze M.D.   On: 07/22/2020 15:18   MR BRAIN WO CONTRAST  Result Date: 07/22/2020 CLINICAL DATA:  Neuro deficit, acute, stroke suspected. Additional history provided: Slurred speech, loss of balance Tuesday 10 19, uncontrolled hypertension. EXAM: MRI HEAD WITHOUT CONTRAST TECHNIQUE: Multiplanar, multiecho pulse sequences of the brain and surrounding structures were obtained without intravenous contrast. COMPARISON:  Prior head CT 07/22/2020. FINDINGS: Brain: Mild generalized cerebral atrophy. 13 mm focus of restricted diffusion within the left pons consistent with acute/early subacute infarction (series 3, image 15). Chronic lacunar infarcts within the bilateral cerebral white matter. Background mild multifocal T2/FLAIR hyperintensity within the cerebral white matter and pons is nonspecific, but compatible with chronic small vessel ischemic disease. No evidence of intracranial mass. No chronic intracranial blood products. No extra-axial fluid collection. No midline shift. Vascular: Expected proximal arterial flow voids. Skull and upper cervical spine: Nonspecific heterogeneous marrow signal of the visualized upper cervical spine with regions of abnormal T1 hypointensity (series 5, image 12). Sinuses/Orbits: Visualized orbits show no acute finding. Complete T2 hyperintense  opacification of the left frontal and anterior ethmoid sinuses. Mild mucosal thickening within the remainder of the ethmoid air cells and right greater than left maxillary sinuses. No significant mastoid effusion. IMPRESSION: 13 mm acute/early subacute infarct within the left pons. Chronic lacunar infarcts within the bilateral cerebral white matter. Background mild cerebral atrophy and mild chronic small vessel ischemic disease. Paranasal sinus disease as described. Most notably, there is complete T2 hyperintense opacification of the left frontal and anterior ethmoid sinuses. Heterogeneous marrow signal of the visualized upper cervical spine with regions of abnormal T1 hypointensity. These findings are nonspecific. Consider cervical spine MRI with contrast for further evaluation, particularly if the patient has a history of known malignancy with the possibility of osseous metastases. Electronically Signed   By: Kellie Simmering DO   On: 07/22/2020 12:15   ECHOCARDIOGRAM COMPLETE  Result Date: 07/22/2020    ECHOCARDIOGRAM REPORT   Patient Name:   HADY NIEMCZYK Date of Exam: 07/22/2020 Medical Rec #:  [id number]       Height:       70.0 in Accession #:    [id number]      Weight:       185.0 lb Date of Birth:  Jul 06, 1966      BSA:          2.019 m Patient Age:    60 years        BP:           187/107 mmHg Patient Gender: M               HR:           69 bpm. Exam Location:  Inpatient Procedure: 2D Echo, Cardiac Doppler and Color Doppler Indications:    Stroke 434.91 / I163.9  History:        Patient has no prior history of Echocardiogram examinations.                 Risk Factors:Hypertension, Diabetes, Dyslipidemia and  Non-Smoker.  Sonographer:    Vickie Epley RDCS Referring Phys: [id number] [place]  1. Left ventricular ejection fraction, by estimation, is 55 to 60%. The left ventricle has normal function. The left ventricle has no regional wall motion abnormalities. Left ventricular  diastolic parameters are consistent with Grade I diastolic dysfunction (impaired relaxation). Elevated left atrial pressure.  2. Right ventricular systolic function is normal. The right ventricular size is normal. There is normal pulmonary artery systolic pressure.  3. The mitral valve is normal in structure. Mild mitral valve regurgitation. No evidence of mitral stenosis.  4. The aortic valve is normal in structure. Aortic valve regurgitation is not visualized. No aortic stenosis is present.  5. The inferior vena cava is normal in size with greater than 50% respiratory variability, suggesting right atrial pressure of 3 mmHg. Comparison(s): No prior Echocardiogram. Conclusion(s)/Recommendation(s): No intracardiac source of embolism detected on this transthoracic study. A transesophageal echocardiogram is recommended to exclude cardiac source of embolism if clinically indicated. FINDINGS  Left Ventricle: Left ventricular ejection fraction, by estimation, is 55 to 60%. The left ventricle has normal function. The left ventricle has no regional wall motion abnormalities. The left ventricular internal cavity size was normal in size. There is  no left ventricular hypertrophy. Left ventricular diastolic parameters are consistent with Grade I diastolic dysfunction (impaired relaxation). Elevated left atrial pressure. Right Ventricle: The right ventricular size is normal. No increase in right ventricular wall thickness. Right ventricular systolic function is normal. There is normal pulmonary artery systolic pressure. Left Atrium: Left atrial size was normal in size. Right Atrium: Right atrial size was normal in size. Pericardium: There is no evidence of pericardial effusion. Mitral Valve: The mitral valve is normal in structure. Mild mitral valve regurgitation. No evidence of mitral valve stenosis. Tricuspid Valve: The tricuspid valve is normal in structure. Tricuspid valve regurgitation is mild . No evidence of tricuspid  stenosis. Aortic Valve: The aortic valve is normal in structure. Aortic valve regurgitation is not visualized. No aortic stenosis is present. Pulmonic Valve: The pulmonic valve was normal in structure. Pulmonic valve regurgitation is not visualized. No evidence of pulmonic stenosis. Aorta: The aortic root is normal in size and structure. Venous: The inferior vena cava is normal in size with greater than 50% respiratory variability, suggesting right atrial pressure of 3 mmHg. IAS/Shunts: No atrial level shunt detected by color flow Doppler.  LEFT VENTRICLE PLAX 2D LVIDd:         4.40 cm      Diastology LVIDs:         3.38 cm      LV e' medial:    3.24 cm/s LV PW:         0.94 cm      LV E/e' medial:  13.1 LV IVS:        0.96 cm      LV e' lateral:   4.46 cm/s LVOT diam:     2.20 cm      LV E/e' lateral: 9.5 LV SV:         67 LV SV Index:   33 LVOT Area:     3.80 cm  LV Volumes (MOD) LV vol d, MOD A2C: 118.0 ml LV vol d, MOD A4C: 132.0 ml LV vol s, MOD A2C: 50.0 ml LV vol s, MOD A4C: 58.8 ml LV SV MOD A2C:     68.0 ml LV SV MOD A4C:     132.0 ml LV SV MOD BP:  74.4 ml RIGHT VENTRICLE RV S prime:     12.90 cm/s TAPSE (M-mode): 2.1 cm LEFT ATRIUM             Index       RIGHT ATRIUM           Index LA diam:        3.40 cm 1.68 cm/m  RA Area:     12.70 cm LA Vol (A2C):   38.7 ml 19.17 ml/m RA Volume:   29.10 ml  14.41 ml/m LA Vol (A4C):   40.2 ml 19.91 ml/m LA Biplane Vol: 42.4 ml 21.00 ml/m  AORTIC VALVE LVOT Vmax:   85.50 cm/s LVOT Vmean:  54.700 cm/s LVOT VTI:    0.177 m  AORTA Ao Root diam: 3.40 cm MITRAL VALVE MV Area (PHT): 3.37 cm    SHUNTS MV Decel Time: 225 msec    Systemic VTI:  0.18 m MV E velocity: 42.40 cm/s  Systemic Diam: 2.20 cm MV A velocity: 62.10 cm/s MV E/A ratio:  0.68 Ena Dawley MD Electronically signed by Ena Dawley MD Signature Date/Time: 07/22/2020/6:42:45 PM    Final      Labs:   Basic Metabolic Panel: Recent Labs  Lab 07/22/20 0109 07/22/20 0833 07/22/20 0840  07/22/20 1607 07/23/20 0349  NA 138  --  139  --  141  K 4.6   < > 4.6  --  3.8  CL 103  --  104  --  107  CO2 26  --   --   --  27  GLUCOSE 314*  --  306*  --  110*  BUN 44*  --  43*  --  37*  CREATININE 4.02*  --  4.00* 3.84* 3.81*  CALCIUM 9.5  --   --   --  9.1   < > = values in this interval not displayed.   GFR Estimated Creatinine Clearance: 22.6 mL/min (A) (by C-G formula based on SCr of 3.81 mg/dL (H)). Liver Function Tests: Recent Labs  Lab 07/22/20 0833  AST 23  ALT 50*  ALKPHOS 84  BILITOT 0.8  PROT 6.7  ALBUMIN 3.8   No results for input(s): LIPASE, AMYLASE in the last 168 hours. No results for input(s): AMMONIA in the last 168 hours. Coagulation profile Recent Labs  Lab 07/22/20 0833  INR 0.9    CBC: Recent Labs  Lab 07/20/20 0825 07/22/20 0833 07/22/20 0840 07/22/20 1607 07/23/20 0349  WBC  --  7.3  --  7.6 6.1  NEUTROABS  --  4.9  --   --   --   HGB 11.9* 11.9* 12.6* 12.2* 11.2*  HCT  --  38.4* 37.0* 38.5* 35.3*  MCV  --  91.9  --  90.8 90.3  PLT  --  205  --  186 199   Cardiac Enzymes: No results for input(s): CKTOTAL, CKMB, CKMBINDEX, TROPONINI in the last 168 hours. BNP: Invalid input(s): POCBNP CBG: Recent Labs  Lab 07/23/20 1111 07/23/20 1347 07/23/20 1614 07/23/20 2135 07/24/20 0557  GLUCAP 255* 367* 335* 263* 114*   D-Dimer No results for input(s): DDIMER in the last 72 hours. Hgb A1c Recent Labs    07/22/20 0833 07/23/20 0349  HGBA1C 9.6* 9.3*   Lipid Profile Recent Labs    07/23/20 0349  CHOL 164  HDL 78  LDLCALC 74  TRIG 60  CHOLHDL 2.1   Thyroid function studies No results for input(s): TSH, T4TOTAL, T3FREE, THYROIDAB in the last 72  hours.  Invalid input(s): FREET3 Anemia work up No results for input(s): VITAMINB12, FOLATE, FERRITIN, TIBC, IRON, RETICCTPCT in the last 72 hours. Microbiology Recent Results (from the past 240 hour(s))  Respiratory Panel by RT PCR (Flu A&B, Covid) - Nasopharyngeal Swab      Status: None   Collection Time: 07/22/20  8:58 AM   Specimen: Nasopharyngeal Swab  Result Value Ref Range Status   SARS Coronavirus 2 by RT PCR NEGATIVE NEGATIVE Final    Comment: (NOTE) SARS-CoV-2 target nucleic acids are NOT DETECTED.  The SARS-CoV-2 RNA is generally detectable in upper respiratoy specimens during the acute phase of infection. The lowest concentration of SARS-CoV-2 viral copies this assay can detect is 131 copies/mL. A negative result does not preclude SARS-Cov-2 infection and should not be used as the sole basis for treatment or other patient management decisions. A negative result may occur with  improper specimen collection/handling, submission of specimen other than nasopharyngeal swab, presence of viral mutation(s) within the areas targeted by this assay, and inadequate number of viral copies (<131 copies/mL). A negative result must be combined with clinical observations, patient history, and epidemiological information. The expected result is Negative.  Fact Sheet for Patients:  PinkCheek.be  Fact Sheet for Healthcare Providers:  GravelBags.it  This test is no t yet approved or cleared by the [place] FDA and  has been authorized for detection and/or diagnosis of SARS-CoV-2 by FDA under an Emergency Use Authorization (EUA). This EUA will remain  in effect (meaning this test can be used) for the duration of the COVID-19 declaration under Section 564(b)(1) of the Act, 21 U.S.C. section 360bbb-3(b)(1), unless the authorization is terminated or revoked sooner.     Influenza A by PCR NEGATIVE NEGATIVE Final   Influenza B by PCR NEGATIVE NEGATIVE Final    Comment: (NOTE) The Xpert Xpress SARS-CoV-2/FLU/RSV assay is intended as an aid in  the diagnosis of influenza from Nasopharyngeal swab specimens and  should not be used as a sole basis for treatment. Nasal washings and  aspirates are  unacceptable for Xpert Xpress SARS-CoV-2/FLU/RSV  testing.  Fact Sheet for Patients: PinkCheek.be  Fact Sheet for Healthcare Providers: GravelBags.it  This test is not yet approved or cleared by the [place] FDA and  has been authorized for detection and/or diagnosis of SARS-CoV-2 by  FDA under an Emergency Use Authorization (EUA). This EUA will remain  in effect (meaning this test can be used) for the duration of the  Covid-19 declaration under Section 564(b)(1) of the Act, 21  U.S.C. section 360bbb-3(b)(1), unless the authorization is  terminated or revoked. Performed at [facility], [place] [address] [place], [place] [address]      Discharge Instructions:   Discharge Instructions    Ambulatory referral to Neurology   Complete by: As directed    Follow up with stroke clinic NP (Glenette Bookwalter Vanschaick or Cecille Rubin, if both not available, consider Zachery Dauer, or [facility]) at [facility] in about 4 weeks. Thanks.   Diet - low sodium heart healthy   Complete by: As directed    Diet Carb Modified   Complete by: As directed    Increase activity slowly   Complete by: As directed      Allergies as of 07/24/2020      Reactions   Fexofenadine Rash   unknown unknown unknown   Latex Rash      Medication List    TAKE these medications   amLODipine 10 MG tablet Commonly  known as: NORVASC Take 10 mg by mouth daily.   aspirin 81 MG EC tablet Take 1 tablet (81 mg total) by mouth daily. Swallow whole.   atorvastatin 80 MG tablet Commonly known as: LIPITOR Take 1 tablet (80 mg total) by mouth daily. What changed:   medication strength  how much to take  when to take this   Baqsimi One Pack 3 MG/DOSE Powd Generic drug: Glucagon Place 1 spray into both nostrils as directed. For low Glucose   cholecalciferol 25 MCG (1000 UNIT) tablet Commonly known as: VITAMIN D3 Take 1,000 Units by mouth daily.    clopidogrel 75 MG tablet Commonly known as: PLAVIX Take 1 tablet (75 mg total) by mouth daily.   Fifty50 Pen Needles 32G X 4 MM Misc Generic drug: Insulin Pen Needle Use with lantus and humalog 4 imes per day   glucagon 1 MG injection Inject 1 mg into the skin once as needed (low Glucose).   HumaLOG KwikPen 100 UNIT/ML KwikPen Generic drug: insulin lispro Inject 5-8 Units into the skin 3 (three) times daily. Take 5 units at breakfast and lunch and Take 8 units at dinner.   hydrALAZINE 50 MG tablet Commonly known as: APRESOLINE Take 50 mg by mouth 3 (three) times daily.   hydrochlorothiazide 25 MG tablet Commonly known as: HYDRODIURIL Take 25 mg by mouth daily.   insulin degludec 100 UNIT/ML FlexTouch Pen Commonly known as: TRESIBA Inject 14 Units into the skin at bedtime.   losartan 100 MG tablet Commonly known as: COZAAR Take 100 mg by mouth daily.   montelukast 10 MG tablet Commonly known as: SINGULAIR Take 10 mg by mouth at bedtime.   ONE TOUCH LANCETS Misc Use to check blood sugar 8 time(s) daily   OneTouch Verio Flex System w/Device Kit by Does not apply route.       Follow-up Information    [facility]. Schedule an appointment as soon as possible for a visit in 4 week(s).   Specialty: Neurology Contact information: [address] [place], [place] Follow up in 1 week(s).   Specialty: Family Medicine Contact information: [address] [place] [phone]                Time coordinating discharge: 35 min  Signed:  Geradine Girt DO  [facility] 07/24/2020, 8:33 AM

## 2020-07-24 NOTE — Progress Notes (Signed)
Occupational Therapy Treatment Patient Details Name: Louis Peters MRN: [id number] DOB: 13-Jul-1966 Today's Date: 07/24/2020    History of present illness 54 y.o. male with PMH of uncontrolled hypertension, uncontrolled hypercholesterolemia, uncontrolled type I diabetes with retinopathy, chronic kidney disease, retinal detachment s/p repair. Pt admitted to [facility] on 10/21 for slurred speech, 2 day history of gait imbalance. 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   OT comments  Pt making good progress towards OT goals this session likely nearing baseline level of function. Pt up in room upon arrival continuing to present with R [facility] deficits impactings pt ability to complete BADLs. Pt completed therex as indicated below with no reports of pain. Pt able to complete dynamic balance tasks with no AD or LOB. Issued pt deck of cards to facilitate increased [facility] in R hand. Pt reports he's being working on in Recruitment consultant with coins and demonstrates improvements in comparison to previous session. Agree with DC plan below, will follow acutely per POC.    Follow Up Recommendations  No OT follow up;Supervision - Intermittent    Equipment Recommendations  None recommended by OT    Recommendations for Other Services      Precautions / Restrictions Precautions Precautions: None Restrictions Weight Bearing Restrictions: No       Mobility Bed Mobility Overal bed mobility: Independent             General bed mobility comments: pt up in room upon OTA arrrival  Transfers Overall transfer level: Independent Equipment used: None             General transfer comment: no physical assist, WNL    Balance Overall balance assessment: No apparent balance deficits (not formally assessed)                                         ADL either performed or assessed with clinical judgement   ADL                                        Functional mobility during ADLs: Independent General ADL Comments: pt very close to baseline level of function, per nurinsg pt doing pushups in room earlier this AM. session focus on [facility] skills for ADL participation. suspect pt MOD I- supervision for ADLs     Vision       Perception     Praxis      Cognition Arousal/Alertness: Awake/alert Behavior During Therapy: WFL for tasks assessed/performed Overall Cognitive Status: Within Functional Limits for tasks assessed                                 General Comments: lack of insight into deficits, most likely close to baseline        Exercises Hand Exercises Digit Composite Flexion: AROM;Right;5 reps;Seated Composite Extension: AROM;Right;5 reps;Seated Digit Composite Abduction: AROM;Right;Both;Seated Digit Composite Adduction: AROM;Right;Both;Seated Digit Lifts: AROM;Right;Seated;5 reps Opposition: AROM;Right;5 reps;Seated Hand Activities Cards - Deal: Right;Seated (flipping up one card at a time and matching based on color) Cards - Flip: Right;Seated Other Exercises Other Exercises: dynamic standing balance task where therapist instructed pt to retrieve cards placed on wall to facilitate improved [place] and challenge dynamic balance with higher level GM movement  Other Exercises: in-hand manipulation skills using coins Other Exercises: encouraged stacking cups at home and dealing/ playing cards at home   Shoulder Instructions       General Comments pt reports living with wife who can assist as needed    Pertinent Vitals/ Pain       Pain Assessment: No/denies pain  Home Living                                          Prior Functioning/Environment              Frequency  Min 2X/week        Progress Toward Goals  OT Goals(current goals can now be found in the care plan section)  Progress towards OT goals: Progressing toward goals  Acute Rehab OT Goals Patient Stated Goal: go  home OT Goal Formulation: With patient/family Time For Goal Achievement: 07/30/20 Potential to Achieve Goals: Good  Plan Discharge plan remains appropriate;Frequency remains appropriate    Co-evaluation                 AM-PAC OT "6 Clicks" Daily Activity     Outcome Measure   Help from another person eating meals?: None Help from another person taking care of personal grooming?: None Help from another person toileting, which includes using toliet, bedpan, or urinal?: None Help from another person bathing (including washing, rinsing, drying)?: None Help from another person to put on and taking off regular upper body clothing?: None Help from another person to put on and taking off regular lower body clothing?: A Little 6 Click Score: 23    End of Session    OT Visit Diagnosis: Muscle weakness (generalized) (M62.81)   Activity Tolerance Patient tolerated treatment well   Patient Left Other (comment) (seated on couch)   Nurse Communication Mobility status        Time: 9983-3825 OT Time Calculation (min): 15 min  Charges: OT General Charges $OT Visit: 1 Visit OT Treatments $Therapeutic Exercise: 8-22 mins  Lanier Clam., COTA/L Acute Rehabilitation Services [phone]    Ihor Gully 07/24/2020, 10:23 AM

## 2020-07-24 NOTE — Progress Notes (Signed)
Nsg Discharge Note  Admit Date:  07/22/2020 Discharge date: 07/24/2020   GARED GILLIE to be D/C'd Home per MD order.  AVS completed.  Copy for chart, and copy for patient signed, and dated. Patient/caregiver able to verbalize understanding.  Discharge Medication: Allergies as of 07/24/2020      Reactions   Fexofenadine Rash   unknown unknown unknown   Latex Rash      Medication List    TAKE these medications   amLODipine 10 MG tablet Commonly known as: NORVASC Take 10 mg by mouth daily.   aspirin 81 MG EC tablet Take 1 tablet (81 mg total) by mouth daily. Swallow whole.   atorvastatin 80 MG tablet Commonly known as: LIPITOR Take 1 tablet (80 mg total) by mouth daily. What changed:   medication strength  how much to take  when to take this   Baqsimi One Pack 3 MG/DOSE Powd Generic drug: Glucagon Place 1 spray into both nostrils as directed. For low Glucose   cholecalciferol 25 MCG (1000 UNIT) tablet Commonly known as: VITAMIN D3 Take 1,000 Units by mouth daily.   clopidogrel 75 MG tablet Commonly known as: PLAVIX Take 1 tablet (75 mg total) by mouth daily.   Fifty50 Pen Needles 32G X 4 MM Misc Generic drug: Insulin Pen Needle Use with lantus and humalog 4 imes per day   glucagon 1 MG injection Inject 1 mg into the skin once as needed (low Glucose).   HumaLOG KwikPen 100 UNIT/ML KwikPen Generic drug: insulin lispro Inject 5-8 Units into the skin 3 (three) times daily. Take 5 units at breakfast and lunch and Take 8 units at dinner.   hydrALAZINE 50 MG tablet Commonly known as: APRESOLINE Take 50 mg by mouth 3 (three) times daily.   hydrochlorothiazide 25 MG tablet Commonly known as: HYDRODIURIL Take 25 mg by mouth daily.   insulin degludec 100 UNIT/ML FlexTouch Pen Commonly known as: TRESIBA Inject 14 Units into the skin at bedtime.   losartan 100 MG tablet Commonly known as: COZAAR Take 100 mg by mouth daily.   montelukast 10 MG  tablet Commonly known as: SINGULAIR Take 10 mg by mouth at bedtime.   ONE TOUCH LANCETS Misc Use to check blood sugar 8 time(s) daily   OneTouch Verio Flex System w/Device Kit by Does not apply route.       Discharge Assessment: Vitals:   07/24/20 0532 07/24/20 1048  BP: (!) 159/89 (!) 155/73  Pulse: 70 79  Resp: 18   Temp: 97.9 F (36.6 C)   SpO2: 97%    Skin clean, dry and intact without evidence of skin break down, no evidence of skin tears noted. IV catheter discontinued intact. Site without signs and symptoms of complications - no redness or edema noted at insertion site, patient denies c/o pain - only slight tenderness at site.  Dressing with slight pressure applied.  D/c Instructions-Education: Discharge instructions given to patient/family with verbalized understanding. D/c education completed with patient/family including follow up instructions, medication list, d/c activities limitations if indicated, with other d/c instructions as indicated by MD - patient able to verbalize understanding, all questions fully answered. Patient instructed to return to ED, call 911, or call MD for any changes in condition.  Patient escorted via [employer], and D/C home via private auto.  Erasmo Leventhal, RN 07/24/2020 11:19 AM

## 2020-07-24 NOTE — Plan of Care (Signed)
  Problem: Education: Goal: Knowledge of secondary prevention will improve Outcome: Progressing

## 2020-07-26 DIAGNOSIS — R4781 Slurred speech: Secondary | ICD-10-CM | POA: Insufficient documentation

## 2020-08-03 ENCOUNTER — Encounter (HOSPITAL_COMMUNITY)
Admission: RE | Admit: 2020-08-03 | Discharge: 2020-08-03 | Disposition: A | Discharge status: Home / Self Care | Payer: Medicare Other | Source: Ambulatory Visit | Attending: Nephrology | Admitting: Nephrology

## 2020-08-03 ENCOUNTER — Other Ambulatory Visit: Payer: Self-pay

## 2020-08-03 VITALS — BP 171/91 | HR 82 | Temp 98.2°F | Resp 18

## 2020-08-03 DIAGNOSIS — N1832 Chronic kidney disease, stage 3b: Secondary | ICD-10-CM | POA: Diagnosis not present

## 2020-08-03 DIAGNOSIS — D631 Anemia in chronic kidney disease: Secondary | ICD-10-CM | POA: Insufficient documentation

## 2020-08-03 LAB — POCT HEMOGLOBIN-HEMACUE: Hemoglobin: 12.5 g/dL — ABNORMAL LOW (ref 13.0–17.0)

## 2020-08-03 LAB — IRON AND TIBC
Iron: 90 ug/dL (ref 45–182)
Saturation Ratios: 37 % (ref 17.9–39.5)
TIBC: 245 ug/dL — ABNORMAL LOW (ref 250–450)
UIBC: 155 ug/dL

## 2020-08-03 LAB — FERRITIN: Ferritin: 201 ng/mL (ref 24–336)

## 2020-08-03 MED ORDER — EPOETIN ALFA-EPBX 10000 UNIT/ML IJ SOLN: 10000.0000 [IU] | INTRAMUSCULAR | Status: DC

## 2020-08-17 ENCOUNTER — Other Ambulatory Visit: Payer: Self-pay

## 2020-08-17 ENCOUNTER — Encounter (HOSPITAL_COMMUNITY)
Admission: RE | Admit: 2020-08-17 | Discharge: 2020-08-17 | Disposition: A | Discharge status: Home / Self Care | Payer: Medicare Other | Source: Ambulatory Visit | Attending: Nephrology | Admitting: Nephrology

## 2020-08-17 VITALS — BP 179/63 | HR 78 | Temp 98.2°F | Resp 18

## 2020-08-17 DIAGNOSIS — N1832 Chronic kidney disease, stage 3b: Secondary | ICD-10-CM

## 2020-08-17 LAB — POCT HEMOGLOBIN-HEMACUE: Hemoglobin: 11.6 g/dL — ABNORMAL LOW (ref 13.0–17.0)

## 2020-08-17 MED ORDER — EPOETIN ALFA-EPBX 10000 UNIT/ML IJ SOLN
INTRAMUSCULAR | Status: AC
  Filled 2020-08-17: qty 1

## 2020-08-17 MED ORDER — EPOETIN ALFA-EPBX 10000 UNIT/ML IJ SOLN
10000.0000 [IU] | INTRAMUSCULAR | Status: DC
  Administered 2020-08-17: 10000 [IU] via SUBCUTANEOUS

## 2020-08-25 ENCOUNTER — Other Ambulatory Visit: Payer: Self-pay

## 2020-08-25 ENCOUNTER — Encounter: Payer: Self-pay | Admitting: Adult Health

## 2020-08-25 ENCOUNTER — Ambulatory Visit: Payer: Medicare Other | Admitting: Adult Health

## 2020-08-25 VITALS — BP 154/74 | HR 87 | Ht 70.0 in | Wt 185.0 lb

## 2020-08-25 DIAGNOSIS — I639 Cerebral infarction, unspecified: Secondary | ICD-10-CM

## 2020-08-25 DIAGNOSIS — I10 Essential (primary) hypertension: Secondary | ICD-10-CM | POA: Diagnosis not present

## 2020-08-25 DIAGNOSIS — E785 Hyperlipidemia, unspecified: Secondary | ICD-10-CM | POA: Diagnosis not present

## 2020-08-25 DIAGNOSIS — E1065 Type 1 diabetes mellitus with hyperglycemia: Secondary | ICD-10-CM | POA: Diagnosis not present

## 2020-08-25 DIAGNOSIS — I69328 Other speech and language deficits following cerebral infarction: Secondary | ICD-10-CM

## 2020-08-25 NOTE — Progress Notes (Signed)
[facility] [address] [place]. [place] [address] [phone]       [place]  Mr. CJ EDGELL Date of Birth:  08/28/1966 Medical Record Number:  [id number]   Reason for Referral:  hospital stroke follow up    SUBJECTIVE:   CHIEF COMPLAINT:  Chief Complaint  Patient presents with  . Hospitalization Follow-up    rm 9, with wife, reports difficulty speaking     HPI:   Mr. ALYAN HARTLINE is a 54 y.o. male with history of f retinal detachment, uncontrolled hypertension, uncontrolled hypercholesterolemia, uncontrolled diabetes, chronic kidney disease and medication noncompliance PTA  who presented on 07/22/2020 with 1 d hx unsteady gait and slurred speech.  Personally reviewed hospitalization pertinent progress notes, lab work and imaging with summary provided.  Evaluated by Dr. 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  Recommended DAPT for 3 weeks and aspirin alone.  Presented with hypertensive urgency with BP 241/109 on arrival and resumed home dose amlodipine and losartan with long-term BP goal normotensive range.  LDL 74 and increased atorvastatin from 40 mg to 80 mg daily.  Uncontrolled DM with A1c 9.6 with neuropathy, nephropathy and retinopathy and recommended endocrinology referral.  Other stroke risk factors include EtOH use but no prior stroke history.  Evaluated by therapies without therapy needs and discharged home in stable condition.  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 Sinus dz  MRI  L pontine infarct. Old B lacunes and Small vessel disease.   MRA head proximal to mid L SCA high-grade narrowing   MRA neck Unremarkable   2D Echo EF 55-60%. No source of embolus   LDL 74  HgbA1c 9.6  UDS neg  VTE prophylaxis - Heparin 5000 units sq tid   No antithrombotic prior  to admission, now on aspirin 81 mg daily and clopidogrel 75 mg daily. Continue DAPT x 3 weeks then aspirin alone.    Therapy recommendations:  none   Disposition:  home    Today, 08/25/2020, Mr. Boike is being seen for hospital follow-up accompanied by his wife. Reports residual speech difficulty with waxing/waning of symptoms. Denies residual weakness or cognitive issues.  He has returned back to all prior activities without difficulty.  Denies new stroke/TIA symptoms. Has remained on aspirin and Plavix despite 3-week recommendation but denies bleeding or bruising. Remains on atorvastatin 80 mg daily without myalgias. Blood pressure today 154/74. Monitors at home and typically 120-140s. Reports glucose levels remain uncontrolled with consistent fluctuation and has not had follow-up with endocrinology since discharge.  No further concerns at this time.    ROS:   14 system review of systems performed and negative with exception of speech difficulty  PMH:  Past Medical History:  Diagnosis Date  . Benign hypertension with chronic kidney disease, stage III ([place]) 03/10/2019  . Chronic kidney disease (CKD), stage III (moderate) ([place]) 03/10/2019  . Diabetes mellitus type 1, uncomplicated ([place]) 11/04/7626  . Diabetes mellitus without complication ([place])   . Diabetic retinopathy ([place])    PDR OU  . Heat Injury 03/10/2019  . Hypercholesterolemia 03/10/2019  . Hypertension   . Retinal detachment    TRD OD    PSH:  Past Surgical History:  Procedure Laterality Date  . CATARACT EXTRACTION    . EYE SURGERY    . RETINAL DETACHMENT SURGERY  Social History:  Social History   Socioeconomic History  . Marital status: Married    Spouse name: Not on file  . Number of children: Not on file  . Years of education: Not on file  . Highest education level: Not on file  Occupational History  . Not on file  Tobacco Use  . Smoking status: Never Smoker  . Smokeless tobacco: Never Used  Vaping Use  .  Vaping Use: Never used  Substance and Sexual Activity  . Alcohol use: Yes    Comment: occ  . Drug use: No  . Sexual activity: Not on file  Other Topics Concern  . Not on file  Social History Narrative  . Not on file   Social Determinants of Health   Financial Resource Strain:   . Difficulty of Paying Living Expenses: Not on file  Food Insecurity:   . Worried About Charity fundraiser in the Last Year: Not on file  . Ran Out of Food in the Last Year: Not on file  Transportation Needs:   . Lack of Transportation (Medical): Not on file  . Lack of Transportation (Non-Medical): Not on file  Physical Activity:   . Days of Exercise per Week: Not on file  . Minutes of Exercise per Session: Not on file  Stress:   . Feeling of Stress : Not on file  Social Connections:   . Frequency of Communication with Friends and Family: Not on file  . Frequency of Social Gatherings with Friends and Family: Not on file  . Attends Religious Services: Not on file  . Active Member of Clubs or Organizations: Not on file  . Attends Archivist Meetings: Not on file  . Marital Status: Not on file  Intimate Partner Violence:   . Fear of Current or Ex-Partner: Not on file  . Emotionally Abused: Not on file  . Physically Abused: Not on file  . Sexually Abused: Not on file    Family History:  Family History  Problem Relation Age of Onset  . Diabetes Maternal Grandmother   . Diabetes Sister     Medications:   Current Outpatient Medications on File Prior to Visit  Medication Sig Dispense Refill  . amLODipine (NORVASC) 10 MG tablet Take 10 mg by mouth daily.     Marland Kitchen aspirin EC 81 MG EC tablet Take 1 tablet (81 mg total) by mouth daily. Swallow whole. 30 tablet 11  . atorvastatin (LIPITOR) 80 MG tablet Take 1 tablet (80 mg total) by mouth daily. 30 tablet 0  . BAQSIMI ONE PACK 3 MG/DOSE POWD Place 1 spray into both nostrils as directed. For low Glucose    . Blood Glucose Monitoring Suppl  (ONETOUCH VERIO FLEX SYSTEM) w/Device KIT by Does not apply route.    . cholecalciferol (VITAMIN D3) 25 MCG (1000 UNIT) tablet Take 1,000 Units by mouth daily.    Marland Kitchen glucagon 1 MG injection Inject 1 mg into the skin once as needed (low Glucose).     Marland Kitchen HUMALOG KWIKPEN 100 UNIT/ML KiwkPen Inject 5-8 Units into the skin 3 (three) times daily. Take 5 units at breakfast and lunch and Take 8 units at dinner.  0  . hydrALAZINE (APRESOLINE) 50 MG tablet Take 50 mg by mouth 3 (three) times daily.    . hydrochlorothiazide (HYDRODIURIL) 25 MG tablet Take 25 mg by mouth daily.    . insulin degludec (TRESIBA) 100 UNIT/ML SOPN FlexTouch Pen Inject 14 Units into the skin at bedtime.     Marland Kitchen  Insulin Pen Needle (FIFTY50 PEN NEEDLES) 32G X 4 MM MISC Use with lantus and humalog 4 imes per day    . losartan (COZAAR) 100 MG tablet Take 100 mg by mouth daily.     . montelukast (SINGULAIR) 10 MG tablet Take 10 mg by mouth at bedtime.    . ONE TOUCH LANCETS MISC Use to check blood sugar 8 time(s) daily     No current facility-administered medications on file prior to visit.    Allergies:   Allergies  Allergen Reactions  . Fexofenadine Rash    unknown unknown unknown  . Latex Rash      OBJECTIVE:  Physical Exam  Vitals:   08/25/20 1250  BP: (!) 154/74  Pulse: 87  Weight: 185 lb (83.9 kg)  Height: 5' 10"  (1.778 m)   Body mass index is 26.54 kg/m. No exam data present  Depression screen [facility] 2/9 08/25/2020  Decreased Interest 0  Down, Depressed, Hopeless 0  PHQ - 2 Score 0     General: well developed, well nourished,  pleasant middle-age African-American male, seated, in no evident distress Head: head normocephalic and atraumatic.   Neck: supple with no carotid or supraclavicular bruits Cardiovascular: regular rate and rhythm, no murmurs Musculoskeletal: no deformity Skin:  no rash/petichiae Vascular:  Normal pulses all extremities   Neurologic Exam Mental Status: Awake and fully alert.  mild expressive aphasia and milddysarthria. Oriented to place and time. Recent and remote memory intact. Attention span, concentration and fund of knowledge appropriate. Mood and affect appropriate. Recall: 2/3. Serial addition: difficulty. Names 8 animals for 4 legs in 60 seconds. Clock drawing 3/4 Cranial Nerves: Fundoscopic exam reveals sharp disc margins. Pupils equal, briskly reactive to light. Extraocular movements full without nystagmus. Visual fields full to confrontation. Hearing intact. Facial sensation intact. Face, tongue, palate moves normally and symmetrically.  Motor: Normal bulk and tone. Normal strength in all tested extremity muscles. Sensory.: intact to touch , pinprick , position and vibratory sensation.  Coordination: Rapid alternating movements normal in all extremities except slightly decreased right hand dexterity. Finger-to-nose showed RUE ataxia and heel-to-shin performed accurately bilaterally. Gait and Station: Arises from chair without difficulty. Stance is normal. Gait demonstrates normal stride length and balance without use of assistive device Reflexes: 1+ and symmetric. Toes downgoing.     NIHSS  3 Modified [name]  1      ASSESSMENT: BRAELYN BORDONARO is a 54 y.o. 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 Vascular risk factors include uncontrolled HTN, HLD, uncontrolled DM and medication noncompliance.      PLAN:  1. L pontine stroke :  a. Residual deficit: Mild expressive aphasia, mild dysarthria, RUE ataxia and mild cognitive impairment.  He was evaluated by SLP during hospitalization.  He is not interested in pursuing SLP at this time but will call office in the future if interested.  Provided additional information/exercises to complete at home for hopeful ongoing recovery.   b. Continue aspirin 81 mg daily  and atorvastatin for  secondary stroke prevention.  Advised to discontinue Plavix as 3 weeks DAPT completed  c. Discussed secondary stroke prevention measures and importance of close PCP follow up for aggressive stroke risk factor management  2. HTN: BP goal <130/90.  Slightly elevated today but reports stable at home and ongoing compliance with losartan, hydrochlorothiazide, hydralazine and amlodipine 3. HLD: LDL goal <70. Recent  LDL 74.  Increased atorvastatin from 40 mg to 80 mg daily during stroke admission.  Request ongoing follow-up with PCP for repeat lipid panel in the next 1 to 2 months as well as ongoing prescribing of atorvastatin 4. DM type I: A1c goal<7.0. Recent A1c 9.6.  Reports compliance with Tyler Aas and Humalog per endocrinology.  Advised to schedule follow-up visit as he reports continued fluctuation    Follow up in 4 months or call earlier if needed  CC:  [facility] provider: Dr. Audie Pinto, Shanon Brow, MD    I spent 45 minutes of face-to-face and non-face-to-face time with patient and wife.  This included previsit chart review including recent hospitalization pertinent progress notes, lab work and imaging, lab review, study review, order entry, electronic health record documentation, patient education regarding recent stroke and etiology, residual deficits, importance of managing stroke risk factors and medication compliance and answered all questions to patient and wife's satisfaction   Frann Rider, AGNP-BC  [facility] [address] [place], [place] [address]  Phone [phone] Fax [phone] Note: This document was prepared with digital dictation and possible smart phrase technology. Any transcriptional errors that result from this process are unintentional.

## 2020-08-25 NOTE — Progress Notes (Signed)
I agree with the above plan 

## 2020-08-25 NOTE — Patient Instructions (Addendum)
Please call office if you would like to do any speech therapy  Continue aspirin 81 mg daily  and atorvastatin 80 mg daily for secondary stroke prevention Discontinue Plavix as recommended 3 weeks completed  Continue to follow up with PCP regarding cholesterol, blood pressure and diabetes management  Maintain strict control of hypertension with blood pressure goal below 130/90, diabetes with hemoglobin A1c goal below 7% and cholesterol with LDL cholesterol (bad cholesterol) goal below 70 mg/dL.      Followup in the future with me in 4 months or call earlier if needed      Thank you for coming to see [place] at [facility]. I hope we have been able to provide you high quality care today.  You may receive a patient satisfaction survey over the next few weeks. We would appreciate your feedback and comments so that we may continue to improve ourselves and the health of our patients.    Speech-Language Therapy After a Stroke You may have many physical changes after a stroke, and some of them may affect your ability to communicate. You may have problems talking, putting your thoughts into words, or using and understanding words. Speech-language therapy is a common treatment after a stroke. How are speech and language affected by a stroke? A stroke can damage your brain and nervous system. In most people, the left side of the brain controls language and speech, so damage to that area can affect those functions. You may have problems with:  Understanding spoken or written words.  Sharing your thoughts by talking. You may have trouble: ? Speaking clearly. ? Saying what you mean to say. What is aphasia? Aphasia is a condition that affects your ability to communicate with others. A stroke often causes aphasia because of damage to the left side of the brain. This is typically the side that controls the ability to talk and understand language. Symptoms of aphasia  include:  Struggling to think of words and names of people, places, and objects.  Using the wrong words while talking.  Making up new words (without knowing it) that do not make sense to other people.  Problems putting words together into a sentence.  Difficulty understanding others while they talk.  Problems with listening, reading, writing, using numbers, or doing math. How can therapy help? Speech-language therapy is a common treatment during stroke recovery. It may include doing communication activities, exercising your speech muscles, and practicing speech. It may help you:  Learn to talk and communicate again.  Have conversations.  Use the right words to express ideas.  Put words together into sentences.  Learn to speak more clearly so others can understand you.  Use gestures, sign language, symbols, or other methods to express yourself (aided communication). These can be used in place of speech or to add to what you are able to say. Some aided communication may involve use of electronic devices. When will therapy start and where will I have therapy? Your health care provider will decide when it is best for you to start therapy. Some people start rehabilitation, including speech-language therapy, as soon as they are medically stable. This may be 24-48 hours after a stroke. Rehabilitation can take place in a few different places, based on your needs. It may take place in:  The hospital or an in-patient rehabilitation hospital.  An outpatient rehabilitation facility.  A long-term care facility.  A community rehabilitation clinic.  Your home. How can my family and friends support my recovery?  Your family and friends can help by:  Being open about your problems.  Creating or modifying daily routines to make talking and communicating easier.  Lowering background noise.  Being patient when you are trying to express your thoughts or understand other people.  Getting  your attention before talking to you.  Using short and simple sentences and giving you extra time to talk or to respond to questions.  Talking to you in a normal voice.  Keeping eye contact with you during conversation.  Paying attention to your body language.  Using pictures, written words, or symbols to help you understand.  Helping you to use aided communication.  Asking "yes" or "no" questions.  Praising your speech and progress. Summary  Since a stroke results from damage to your brain and nervous system, it can affect your speech and ability to use and understand language.  Aphasia is a condition that affects your ability to communicate with others. A stroke often causes aphasia because of damage to the left side of the brain.  Speech-language therapy is a common treatment after a stroke.  Your family and friends can help by being open about your problems, giving you time to form words and sentences, and speaking in short, simple sentences. This information is not intended to replace advice given to you by your health care provider. Make sure you discuss any questions you have with your health care provider. Document Revised: 06/13/2019 Document Reviewed: 01/05/2017 Elsevier Patient Education  [place].

## 2020-08-30 ENCOUNTER — Telehealth: Payer: Self-pay | Admitting: Adult Health

## 2020-08-30 DIAGNOSIS — I69328 Other speech and language deficits following cerebral infarction: Secondary | ICD-10-CM

## 2020-08-30 NOTE — Telephone Encounter (Signed)
I called wife and relayed that the order for ST at neuro rehab has been done.  I gave her the [phone] to call and check on scheduling.

## 2020-08-30 NOTE — Telephone Encounter (Signed)
Pt's wife, Lewie Deman (on [place]) called, has decided he would like to get speech therapy. Please let the physician know. Would like a call from the nurse.

## 2020-08-31 ENCOUNTER — Encounter (HOSPITAL_COMMUNITY)
Admission: RE | Admit: 2020-08-31 | Discharge: 2020-08-31 | Disposition: A | Discharge status: Home / Self Care | Payer: Medicare Other | Source: Ambulatory Visit | Attending: Nephrology | Admitting: Nephrology

## 2020-08-31 ENCOUNTER — Other Ambulatory Visit: Payer: Self-pay

## 2020-08-31 VITALS — BP 166/70 | HR 81 | Temp 98.3°F | Resp 18

## 2020-08-31 DIAGNOSIS — N1832 Chronic kidney disease, stage 3b: Secondary | ICD-10-CM

## 2020-08-31 LAB — POCT HEMOGLOBIN-HEMACUE: Hemoglobin: 12.2 g/dL — ABNORMAL LOW (ref 13.0–17.0)

## 2020-08-31 MED ORDER — EPOETIN ALFA-EPBX 10000 UNIT/ML IJ SOLN: 10000.0000 [IU] | INTRAMUSCULAR | Status: DC

## 2020-09-03 ENCOUNTER — Ambulatory Visit: Payer: Medicare Other | Attending: Adult Health

## 2020-09-03 ENCOUNTER — Other Ambulatory Visit: Payer: Self-pay

## 2020-09-03 DIAGNOSIS — R1313 Dysphagia, pharyngeal phase: Secondary | ICD-10-CM | POA: Diagnosis present

## 2020-09-03 DIAGNOSIS — R471 Dysarthria and anarthria: Secondary | ICD-10-CM | POA: Insufficient documentation

## 2020-09-03 NOTE — Patient Instructions (Signed)
° ° °  Please keep track of the DATE you cough and WHAT you coughed with. Just put a piece of notebook paper on the frig to do this.  Bring that back to therapy next time.  ================================================  Practice the sentences twice a day - move the muscles more than you would normally.

## 2020-09-05 NOTE — Therapy (Addendum)
[facility] [address] [place], [place], [address] Phone: [phone]   Fax:  [phone]  Speech Language Pathology Evaluation  Patient Details  Name: Louis Peters MRN: [id number] Date of Birth: 1966-02-28 Referring Provider (SLP): Frann Rider, NP   Encounter Date: 09/03/2020   End of Session - 09/05/20 0925    Visit Number 1    Number of Visits 17    Date for SLP Re-Evaluation 12/16/20   extended two weeks out from 90 day date as pt's first appointment 4 weeks after eval date   SLP Start Time 33    SLP Stop Time  41    SLP Time Calculation (min) 40 min    Activity Tolerance Patient tolerated treatment well           Past Medical History:  Diagnosis Date  . Benign hypertension with chronic kidney disease, stage III ([place]) 03/10/2019  . Chronic kidney disease (CKD), stage III (moderate) ([place]) 03/10/2019  . Diabetes mellitus type 1, uncomplicated ([place]) 11/03/252  . Diabetes mellitus without complication ([place])   . Diabetic retinopathy ([place])    PDR OU  . Heat Injury 03/10/2019  . Hypercholesterolemia 03/10/2019  . Hypertension   . Retinal detachment    TRD OD    Past Surgical History:  Procedure Laterality Date  . CATARACT EXTRACTION    . EYE SURGERY    . RETINAL DETACHMENT SURGERY      There were no vitals filed for this visit.       SLP Evaluation [facility] - 09/05/20 2706      SLP Visit Information   SLP Received On 09/03/20    Referring Provider (SLP) Frann Rider, NP    Onset Date 07-20-20    Medical Diagnosis CVA      Subjective   Subjective "I still do the same things I did before (the CVA)."    Patient/Family Stated Goal "Try and get my speech back."      Pain Assessment   Currently in Pain? No/denies      General Information   HPI Complicated neuro history including chronic lacunar infarcts in bil cerebral wihite matter, mild cerebral atrophy and mild chronic small vessel ischemic  disease. 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 PMH of uncontrolled HTN, hypercholesterolemia, uncontrolled type 1 diabetes, chronic kidney disease, and retinal detachment with repair.       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"     Cognition   Overall Cognitive Status Within Functional Limits for tasks assessed      Auditory Comprehension   Overall Auditory Comprehension Appears within functional limits for tasks assessed      Verbal Expression   Overall Verbal Expression Appears within functional limits for tasks assessed      Oral Motor/Sensory Function   Overall Oral Motor/Sensory Function Appears within functional limits for tasks assessed    Labial ROM Within Functional Limits    Labial Sensation Within Functional Limits    Labial Coordination Reduced    Lingual Symmetry Abnormal symmetry left    Lingual Sensation Within Functional Limits    Lingual Coordination Reduced   R->L movement more intermittent than  L->R     Motor Speech   Overall Motor Speech Impaired    Respiration Within functional limits    Phonation Normal    Articulation Impaired    Level of Impairment Phrase    Intelligibility Intelligible   90%+ in mod complex conversation; decr'd with emotion   Effective Techniques Over-articulate;Slow rate   WNL speech using these stratgies reading setnences                          SLP Education - 09/05/20 0924    Education Details cough log, speech compensations    Person(s) Educated Patient    Methods Explanation;Demonstration;Verbal cues;Handout    Comprehension Verbalized understanding;Returned demonstration;Verbal cues required;Need further instruction            SLP Short Term Goals -  09/05/20 0935      SLP SHORT TERM GOAL #1   Title pt will copmlete dysarthria HEP with rare min A over 3 sessions    Time 4    Period Weeks    Status New      SLP SHORT TERM GOAL #2   Title pt will demonstrtate speech compensations in 10 minutes min-mod complex conversation 80% of the time in 3 sessions    Time 4    Period Weeks    Status New      SLP SHORT TERM GOAL #3   Title pt will undergo objective swallowing assessment PRN    Time 4    Period Weeks    Status New      SLP SHORT TERM GOAL #4   Status Revised            SLP Long Term Goals - 09/05/20 9024      SLP LONG TERM GOAL #1   Title pt will use speech intelligibilty strategies in 20 minutes mod complex conversation x3 sessions    Time 8    Period Weeks   or 17 total sessions           Plan - 09/05/20 0926    Clinical Impression Statement Pt presents today with 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 Arrived to ED 07-22-20. [name] also reports coughing with POs occasionally. Today when eating a cereal bar and drinking water there were not idenfiable issues with his swallowing. If clinically indicated pt will require objective swallow assessment during therapy course. SLP asked pt to have his wife keep a cough log (pt with reduced literacy) and bring to next ST session. He also states frustration about having to repeat himself with people but tells SLP that his dysarthria has not decr'd his desire to speak at all. [name] has hx of uncontrolled HNT hypercohloesterolemia and type 1 diabetes, chronic kidney disease, chronic lacunar infarcts in bil cerbral white matter, mild cerebral atrophy, mild chronic small vessel ischemic disease. SLP provided [name] with some simple conversational sentences to practice with overarticulation. SLP recommeds skilled ST to improve pt's intelligibility to decr his frustration with speaking, and to ID and treat any possible lingering dysphagia.    Speech Therapy Frequency 2x / week     Duration 8 weeks   or 17 sessions   Treatment/Interventions Aspiration precaution training;Pharyngeal strengthening exercises;Diet toleration management by SLP;Trials of upgraded texture/liquids;Cueing hierarchy;Patient/family education;Compensatory strategies;SLP instruction and feedback;Oral motor exercises;Functional tasks;Other (comment)    Potential to Achieve Goals Good    SLP Home Exercise Plan provided    Consulted and Agree with Plan of Care  Patient           Patient will benefit from skilled therapeutic intervention in order to improve the following deficits and impairments:   Dysarthria and anarthria  Dysphagia, pharyngeal phase    Problem List Patient Active Problem List   Diagnosis Date Noted  . Hypertension   . Hypertensive urgency   . Ischemic stroke ([place])   . AKI (acute kidney injury) ([place]) 03/10/2019  . Heat Injury 03/10/2019  . Chronic kidney disease (CKD), stage III (moderate) ([place]) 03/10/2019  . Benign hypertension with chronic kidney disease, stage III ([place]) 03/10/2019  . Hypercholesterolemia 03/10/2019  . Acute kidney injury superimposed on chronic kidney disease ([place])   . Diabetes mellitus type 1, uncomplicated ([place]) 35/32/9924    [facility], [place]  09/05/2020, 9:53 AM  [facility] [address] [place], [place], [address] Phone: [phone]   Fax:  [phone]  Name: Louis Peters MRN: [id number] Date of Birth: 10-16-1965

## 2020-09-14 ENCOUNTER — Other Ambulatory Visit: Payer: Self-pay

## 2020-09-14 ENCOUNTER — Encounter (HOSPITAL_COMMUNITY)
Admission: RE | Admit: 2020-09-14 | Discharge: 2020-09-14 | Disposition: A | Discharge status: Home / Self Care | Payer: Medicare Other | Source: Ambulatory Visit | Attending: Nephrology | Admitting: Nephrology

## 2020-09-14 VITALS — BP 166/78 | HR 92 | Temp 97.4°F | Resp 18

## 2020-09-14 DIAGNOSIS — N1832 Chronic kidney disease, stage 3b: Secondary | ICD-10-CM | POA: Insufficient documentation

## 2020-09-14 DIAGNOSIS — D631 Anemia in chronic kidney disease: Secondary | ICD-10-CM | POA: Insufficient documentation

## 2020-09-14 LAB — FERRITIN: Ferritin: 253 ng/mL (ref 24–336)

## 2020-09-14 LAB — IRON AND TIBC
Iron: 118 ug/dL (ref 45–182)
Saturation Ratios: 50 % — ABNORMAL HIGH (ref 17.9–39.5)
TIBC: 237 ug/dL — ABNORMAL LOW (ref 250–450)
UIBC: 119 ug/dL

## 2020-09-14 LAB — POCT HEMOGLOBIN-HEMACUE: Hemoglobin: 11.9 g/dL — ABNORMAL LOW (ref 13.0–17.0)

## 2020-09-14 MED ORDER — EPOETIN ALFA-EPBX 10000 UNIT/ML IJ SOLN
INTRAMUSCULAR | Status: AC
  Filled 2020-09-14: qty 1

## 2020-09-14 MED ORDER — EPOETIN ALFA-EPBX 10000 UNIT/ML IJ SOLN
10000.0000 [IU] | INTRAMUSCULAR | Status: DC
  Administered 2020-09-14: 09:00:00 10000 [IU] via SUBCUTANEOUS

## 2020-09-28 ENCOUNTER — Other Ambulatory Visit: Payer: Self-pay

## 2020-09-28 ENCOUNTER — Encounter (HOSPITAL_COMMUNITY)
Admission: RE | Admit: 2020-09-28 | Discharge: 2020-09-28 | Disposition: A | Discharge status: Home / Self Care | Payer: Medicare Other | Source: Ambulatory Visit | Attending: Nephrology | Admitting: Nephrology

## 2020-09-28 VITALS — BP 161/76 | HR 88 | Temp 97.8°F | Resp 18

## 2020-09-28 DIAGNOSIS — N1832 Chronic kidney disease, stage 3b: Secondary | ICD-10-CM | POA: Diagnosis not present

## 2020-09-28 LAB — POCT HEMOGLOBIN-HEMACUE: Hemoglobin: 11.8 g/dL — ABNORMAL LOW (ref 13.0–17.0)

## 2020-09-28 MED ORDER — EPOETIN ALFA-EPBX 10000 UNIT/ML IJ SOLN
INTRAMUSCULAR | Status: AC
  Filled 2020-09-28: qty 1

## 2020-09-28 MED ORDER — EPOETIN ALFA-EPBX 10000 UNIT/ML IJ SOLN
10000.0000 [IU] | INTRAMUSCULAR | Status: DC
  Administered 2020-09-28: 08:00:00 10000 [IU] via SUBCUTANEOUS

## 2020-10-04 ENCOUNTER — Ambulatory Visit: Payer: Medicare Other | Attending: Adult Health

## 2020-10-04 ENCOUNTER — Other Ambulatory Visit: Payer: Self-pay

## 2020-10-04 DIAGNOSIS — R471 Dysarthria and anarthria: Secondary | ICD-10-CM | POA: Diagnosis not present

## 2020-10-04 NOTE — Therapy (Signed)
[facility] [address] [place], [place], [address] Phone: [phone]   Fax:  [phone]  Speech Language Pathology Treatment  Patient Details  Name: Louis Peters MRN: [id number] Date of Birth: 03/25/1966 Referring Provider (SLP): Frann Rider, NP   Encounter Date: 10/04/2020   End of Session - 10/04/20 1619    Visit Number 2    Number of Visits 17    Date for SLP Re-Evaluation 12/16/20   extended two weeks out from 90 day date as pt's first appointment 4 weeks after eval date   SLP Start Time 0848    SLP Stop Time  0930    SLP Time Calculation (min) 42 min    Activity Tolerance Patient tolerated treatment well           Past Medical History:  Diagnosis Date  . Benign hypertension with chronic kidney disease, stage III ([place]) 03/10/2019  . Chronic kidney disease (CKD), stage III (moderate) ([place]) 03/10/2019  . Diabetes mellitus type 1, uncomplicated ([place]) 12/06/6281  . Diabetes mellitus without complication ([place])   . Diabetic retinopathy ([place])    PDR OU  . Heat Injury 03/10/2019  . Hypercholesterolemia 03/10/2019  . Hypertension   . Retinal detachment    TRD OD    Past Surgical History:  Procedure Laterality Date  . CATARACT EXTRACTION    . EYE SURGERY    . RETINAL DETACHMENT SURGERY      There were no vitals filed for this visit.   Subjective Assessment - 10/04/20 0846    Subjective "Your Chr-mas was good?"    Currently in Pain? No/denies                 ADULT SLP TREATMENT - 10/04/20 0854      General Information   Behavior/Cognition Alert;Cooperative;Pleasant mood      Treatment Provided   Treatment provided Cognitive-Linquistic      Cognitive-Linquistic Treatment   Treatment focused on Dysarthria    Skilled Treatment Pt reports coughing with solids and liquids during meals has resolved completely. SLP assessed pt's lingual strength and labial strength - SLP added speech-based oral  motor strength exercises for pt today and he and SLP reviewed them with SLP demonstration and verbal explanation due to pt's reduced literacy. SLP ensured pt completed correctly prior to moving on to next exercise. Pt read 90% of target words correctly. May require SLP cueing for focusing on strength of articulator pressure and reducing loudness of productions.      Assessment / Recommendations / Plan   Plan Continue with current plan of care;Goals updated      Progression Toward Goals   Progression toward goals Progressing toward goals            SLP Education - 10/04/20 1618    Education Details HEP procedure, need to complete HEP for success with improving articuation    Person(s) Educated Patient    Methods Explanation;Demonstration    Comprehension Verbalized understanding;Returned demonstration;Verbal cues required;Need further instruction            SLP Short Term Goals - 10/04/20 1621      SLP SHORT TERM GOAL #1   Title pt will copmlete dysarthria HEP (for speech compensations) with rare min A over 3 sessions    Time 4    Period Weeks    Status On-going      SLP SHORT TERM GOAL #2   Title pt will demonstrtate speech compensations in 10  minutes min-mod complex conversation 80% of the time in 3 sessions    Time 4    Period Weeks    Status On-going      SLP SHORT TERM GOAL #3   Title pt will undergo objective swallowing assessment PRN    Time 4    Period Weeks    Status On-going      SLP SHORT TERM GOAL #4   Title pt will complete dysarthria (strength of articulators) HEP with rare min A over two sessions    Time 4    Period Weeks    Status New            SLP Long Term Goals - 09/05/20 6803      SLP LONG TERM GOAL #1   Title pt will use speech intelligibilty strategies in 20 minutes mod complex conversation x3 sessions    Time 8    Period Weeks   or 17 total sessions           Plan - 10/04/20 1620    Clinical Impression Statement Pt continues to  present today with 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 Coughin with POs has resolved - no coughing in past 1-2 weeks with POs. Today SLP added word-based (articulation based) HEP for dysarthria. [place] has hx of uncontrolled HNT hypercohloesterolemia and type 1 diabetes, chronic kidney disease, chronic lacunar infarcts in bil cerbral white matter, mild cerebral atrophy, mild chronic small vessel ischemic disease. SLP provided [name] with some simple conversational sentences to practice with overarticulation. SLP recommeds skilled ST to improve pt's intelligibility to decr his frustration with speaking, and to ID and treat any possible lingering dysphagia.    Speech Therapy Frequency 2x / week    Duration 8 weeks   or 17 sessions   Treatment/Interventions Aspiration precaution training;Pharyngeal strengthening exercises;Diet toleration management by SLP;Trials of upgraded texture/liquids;Cueing hierarchy;Patient/family education;Compensatory strategies;SLP instruction and feedback;Oral motor exercises;Functional tasks;Other (comment)    Potential to Achieve Goals Good    SLP Home Exercise Plan provided    Consulted and Agree with Plan of Care Patient           Patient will benefit from skilled therapeutic intervention in order to improve the following deficits and impairments:   Dysarthria and anarthria    Problem List Patient Active Problem List   Diagnosis Date Noted  . Hypertension   . Hypertensive urgency   . Ischemic stroke ([place])   . AKI (acute kidney injury) ([place]) 03/10/2019  . Heat Injury 03/10/2019  . Chronic kidney disease (CKD), stage III (moderate) ([place]) 03/10/2019  . Benign hypertension with chronic kidney disease, stage III ([place]) 03/10/2019  . Hypercholesterolemia 03/10/2019  . Acute kidney injury superimposed on chronic kidney disease ([place])   . Diabetes mellitus type 1, uncomplicated ([place]) 21/22/4825    [facility] ,[place], [place]  10/04/2020, 4:23 PM  [facility] [address] [place], [place], [address] Phone: [phone]   Fax:  [phone]   Name: Louis Peters MRN: [id number] Date of Birth: 12-22-1965

## 2020-10-04 NOTE — Patient Instructions (Signed)
  Purpose: To improve lip and tongue strength in order to help people understand you. These exercises can be done while looking in a mirror.  Do them twice a day.  Lips  1. Press hard and briefly hold all the beginning sounds in these words: Repeat each set 2 times     "ma ma ma"    "boo boo boo"  "pa pa pa."          "[place]" "bye bye bye"  "pie pie pie"          "me me me"  "bee bee bee"  "pea pea pea"   2. Pucker your lips tightly and say "OOOO," then smile wide and say "EEEE."  Repeat _2_ times.  3. Practice whistling for __30__ seconds.  4. "Blow out" candles.  Repeat _10___ times.  5. Blow kisses - make them LOUD!  Repeat __10__ times.   Tip of Tongue  1. Say the sound  "ta ta ta," "la la la,"          and "Engineering geologist."  Repeat __2__ times.               "tee tee tee" "lee lee lee"   "dee dee dee"        "too too too" "[place]"  "do do do"  2. Say "time," "took," "take," "town," and "Tom."  Repeat __2__ times.  3. Say "long," "look," "low," "lay" and "lie."  Repeat _2__ times.  4. Say "name," "neck," "not," "new," and "no."  Repeat __2_ times.  5. Say "down," "dip," "dot," "Don," and "do."  Repeat __2__ times   Back of Tongue  1. Say the sounds "ka ka ka" and "go go go."   Repeat ___2_ times.       "cow cow cow" "guy guy guy"       "koo koo koo" "goo goo goo"  2. Say "kick," "cake," "Anda Kraft," "key," "keep," "kite," and "Ken."  Repeat __2__ times.  3. Say "gate," "gag," "got," "get," "gone," and "go."  Repeat __2__ times.    You can do the ones below if you want, too. ============================================================== LIP and TONGUE exercises  --- DO ALL EXERCISES TWICE EACH DAY  1. Lip press - Press your lips together firmly (like making a /m/ sound) and hold 5 seconds -repeat 10 times  2. LOUD and LONG [place] - make a kissing sound as loud as you can, as long as you can - repeat 10 times  3. Tongue sweep - Sweep your tongue in the pockets  of your mouth - touch every tooth  - five revolutions each way  4.  PA! - 10 times       MAKE THESE STRONG      TWO - 10 times      KEY! - 10 times  5. Move a licorice stick from side to side in your mouth - 10 back and forth movements  6. Say "OOOOO", and "EEEEE" 10 times (Kiss and smile)  7. Put air in your cheeks, and push on either side 10 times  *KEEP THE AIR IN and DON'T LET IT ESCAPE!!*

## 2020-10-05 ENCOUNTER — Ambulatory Visit: Payer: Medicare Other

## 2020-10-11 ENCOUNTER — Ambulatory Visit: Payer: Medicare Other

## 2020-10-12 ENCOUNTER — Encounter (HOSPITAL_COMMUNITY)
Admission: RE | Admit: 2020-10-12 | Discharge: 2020-10-12 | Disposition: A | Discharge status: Home / Self Care | Payer: Medicare Other | Source: Ambulatory Visit | Attending: Nephrology | Admitting: Nephrology

## 2020-10-12 ENCOUNTER — Other Ambulatory Visit: Payer: Self-pay

## 2020-10-12 VITALS — BP 139/63 | HR 96 | Temp 97.5°F | Resp 18

## 2020-10-12 DIAGNOSIS — D631 Anemia in chronic kidney disease: Secondary | ICD-10-CM | POA: Insufficient documentation

## 2020-10-12 DIAGNOSIS — N1832 Chronic kidney disease, stage 3b: Secondary | ICD-10-CM | POA: Diagnosis not present

## 2020-10-12 LAB — IRON AND TIBC
Iron: 91 ug/dL (ref 45–182)
Saturation Ratios: 38 % (ref 17.9–39.5)
TIBC: 239 ug/dL — ABNORMAL LOW (ref 250–450)
UIBC: 148 ug/dL

## 2020-10-12 LAB — POCT HEMOGLOBIN-HEMACUE: Hemoglobin: 12.2 g/dL — ABNORMAL LOW (ref 13.0–17.0)

## 2020-10-12 LAB — FERRITIN: Ferritin: 200 ng/mL (ref 24–336)

## 2020-10-12 MED ORDER — EPOETIN ALFA-EPBX 10000 UNIT/ML IJ SOLN: 10000.0000 [IU] | INTRAMUSCULAR | Status: DC

## 2020-10-13 ENCOUNTER — Ambulatory Visit: Payer: Medicare Other

## 2020-10-14 ENCOUNTER — Other Ambulatory Visit: Payer: Self-pay | Admitting: *Deleted

## 2020-10-14 DIAGNOSIS — N186 End stage renal disease: Secondary | ICD-10-CM

## 2020-10-18 ENCOUNTER — Ambulatory Visit: Payer: Medicare Other

## 2020-10-20 ENCOUNTER — Ambulatory Visit: Payer: Medicare Other

## 2020-10-20 ENCOUNTER — Other Ambulatory Visit: Payer: Self-pay

## 2020-10-20 DIAGNOSIS — R471 Dysarthria and anarthria: Secondary | ICD-10-CM | POA: Diagnosis not present

## 2020-10-20 NOTE — Therapy (Signed)
[facility] [address] [place], [place], [address] Phone: [phone]   Fax:  [phone]  Speech Language Pathology Treatment  Patient Details  Name: LARKIN KOTILA MRN: [id number] Date of Birth: 27-Apr-1966 Referring Provider (SLP): Frann Rider, NP   Encounter Date: 10/20/2020   End of Session - 10/20/20 1045    Visit Number 3    Number of Visits 17    Date for SLP Re-Evaluation 12/16/20   extended two weeks out from 90 day date as pt's first appointment 4 weeks after eval date   SLP Start Time 0850    SLP Stop Time  0923    SLP Time Calculation (min) 33 min    Activity Tolerance Patient tolerated treatment well           Past Medical History:  Diagnosis Date  . Benign hypertension with chronic kidney disease, stage III ([place]) 03/10/2019  . Chronic kidney disease (CKD), stage III (moderate) ([place]) 03/10/2019  . Diabetes mellitus type 1, uncomplicated ([place]) [id number]  . Diabetes mellitus without complication ([place])   . Diabetic retinopathy ([place])    PDR OU  . Heat Injury 03/10/2019  . Hypercholesterolemia 03/10/2019  . Hypertension   . Retinal detachment    TRD OD    Past Surgical History:  Procedure Laterality Date  . CATARACT EXTRACTION    . EYE SURGERY    . RETINAL DETACHMENT SURGERY      There were no vitals filed for this visit.          ADULT SLP TREATMENT - 10/20/20 0926      General Information   Behavior/Cognition Alert;Cooperative;Pleasant mood      Treatment Provided   Treatment provided Cognitive-Linquistic      Cognitive-Linquistic Treatment   Treatment focused on Dysarthria    Skilled Treatment Pt reports that he thinks his speech has improved since last visit. SLP agrees - "My grand-daughter is helping me with my words, man!" Pt reports he has maintained the HEP each day. Today pt rated his ability to maintain exaggerated speech/compensations on scale 1-5 (5=excellent), pt and SLP  agreed pt with average of 4.5/5.      Assessment / Recommendations / Plan   Plan Continue with current plan of care;Other (Comment)   if pt success continues, decr to once/week next session     Progression Toward Goals   Progression toward goals Progressing toward goals              SLP Short Term Goals - 10/20/20 1048      SLP SHORT TERM GOAL #1   Title pt will copmlete dysarthria HEP (for speech compensations) with rare min A over 3 sessions    Time 3    Period Weeks    Status On-going      SLP SHORT TERM GOAL #2   Title pt will demonstrtate speech compensations in 10 minutes min-mod complex conversation 80% of the time in 3 sessions    Baseline 10-20-20    Time 3    Period Weeks    Status On-going      SLP SHORT TERM GOAL #3   Title pt will undergo objective swallowing assessment PRN    Status Deferred      SLP SHORT TERM GOAL #4   Title pt will complete dysarthria (strength of articulators) HEP with rare min A over two sessions    Time 3    Period Weeks    Status On-going  SLP Long Term Goals - 10/20/20 1048      SLP LONG TERM GOAL #1   Title pt will use speech intelligibilty strategies in 20 minutes mod complex conversation x3 sessions    Time 7    Period Weeks   or 17 total sessions   Status On-going            Plan - 10/20/20 1046    Clinical Impression Statement Pt continues to present today with 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 Today, Ramond did very well with speech compensations in conversational selections of 2 minutes each x8, and in simple conversation. If pt continues with this success next session SLP suggested pt decr to once/week and pt agreed with this plan. SLP recommeds skilled ST to improve pt's intelligibility to decr his frustration with speaking, and to ID and treat any possible lingering dysphagia.    Speech Therapy Frequency 2x / week    Duration 8 weeks   or 17 sessions   Treatment/Interventions Aspiration precaution  training;Pharyngeal strengthening exercises;Diet toleration management by SLP;Trials of upgraded texture/liquids;Cueing hierarchy;Patient/family education;Compensatory strategies;SLP instruction and feedback;Oral motor exercises;Functional tasks;Other (comment)    Potential to Achieve Goals Good    SLP Home Exercise Plan provided    Consulted and Agree with Plan of Care Patient           Patient will benefit from skilled therapeutic intervention in order to improve the following deficits and impairments:   Dysarthria and anarthria    Problem List Patient Active Problem List   Diagnosis Date Noted  . Hypertension   . Hypertensive urgency   . Ischemic stroke ([place])   . AKI (acute kidney injury) ([place]) 03/10/2019  . Heat Injury 03/10/2019  . Chronic kidney disease (CKD), stage III (moderate) ([place]) 03/10/2019  . Benign hypertension with chronic kidney disease, stage III ([place]) 03/10/2019  . Hypercholesterolemia 03/10/2019  . Acute kidney injury superimposed on chronic kidney disease ([place])   . Diabetes mellitus type 1, uncomplicated ([place]) [id number]    [facility] ,[place], [place]   10/20/2020, 10:49 AM  [facility] [address] [place], [place], [address] Phone: [phone]   Fax:  [phone]   Name: RAYMONDO STAHLECKER MRN: [id number] Date of Birth: 1966/03/26

## 2020-10-25 ENCOUNTER — Ambulatory Visit: Payer: Medicare Other

## 2020-10-25 ENCOUNTER — Other Ambulatory Visit: Payer: Self-pay

## 2020-10-25 DIAGNOSIS — R471 Dysarthria and anarthria: Secondary | ICD-10-CM

## 2020-10-25 NOTE — Therapy (Signed)
[facility] [address] [place], [place], [address] Phone: [phone]   Fax:  [phone]  Speech Language Pathology Treatment  Patient Details  Name: Louis Peters MRN: [id number] Date of Birth: 1966-04-20 Referring Provider (SLP): Louis Rider, NP   Encounter Date: 10/25/2020   End of Session - 10/25/20 1217    Visit Number 4    Number of Visits 17    Date for SLP Re-Evaluation 12/16/20   extended two weeks out from 90 day date as pt's first appointment 4 weeks after eval date   SLP Start Time 0851    SLP Stop Time  0928    SLP Time Calculation (min) 37 min    Activity Tolerance Patient tolerated treatment well           Past Medical History:  Diagnosis Date  . Benign hypertension with chronic kidney disease, stage III ([place]) 03/10/2019  . Chronic kidney disease (CKD), stage III (moderate) ([place]) 03/10/2019  . Diabetes mellitus type 1, uncomplicated ([place]) [id number]  . Diabetes mellitus without complication ([place])   . Diabetic retinopathy ([place])    PDR OU  . Heat Injury 03/10/2019  . Hypercholesterolemia 03/10/2019  . Hypertension   . Retinal detachment    TRD OD    Past Surgical History:  Procedure Laterality Date  . CATARACT EXTRACTION    . EYE SURGERY    . RETINAL DETACHMENT SURGERY      There were no vitals filed for this visit.   Subjective Assessment - 10/25/20 1213    Subjective "Did you watch any games?"    Currently in Pain? No/denies                 ADULT SLP TREATMENT - 10/25/20 0001      General Information   Behavior/Cognition Alert;Cooperative;Pleasant mood      Treatment Provided   Treatment provided Cognitive-Linquistic      Cognitive-Linquistic Treatment   Treatment focused on Dysarthria    Skilled Treatment Pt again reports his speech has improved since last visit. SLP agrees - He reports he has maintained suggested frequency with the HEP. Today pt rated his ability to  maintain exaggerated speech/compensations in 3-minute conversation segments on a scale 1-5 (5=excellent), pt and SLP agreed with pt with average of 5/5. Louis Peters thinks he can reduce frequency to x1/every other week and SLP agrees.      Assessment / Recommendations / Plan   Plan Other (Comment)   decr to once/every other week next session     Progression Toward Goals   Progression toward goals Progressing toward goals              SLP Short Term Goals - 10/25/20 1219      SLP SHORT TERM GOAL #1   Title pt will copmlete dysarthria HEP (for speech compensations) with rare min A over 3 sessions    Time 3    Status Achieved      SLP SHORT TERM GOAL #2   Title pt will demonstrtate speech compensations in 10 minutes min-mod complex conversation 80% of the time in 3 sessions    Baseline 10-20-20, 10-25-20    Time 2    Period Weeks    Status On-going      SLP SHORT TERM GOAL #3   Title pt will undergo objective swallowing assessment PRN    Status Deferred      SLP SHORT TERM GOAL #4   Title pt will  complete dysarthria (strength of articulators) HEP with rare min A over two sessions    Status Achieved            SLP Long Term Goals - 10/25/20 1220      SLP LONG TERM GOAL #1   Title pt will use speech intelligibilty strategies in 20 minutes mod complex conversation x3 sessions    Baseline 10-25-20    Time 6    Period Weeks   or 17 total sessions   Status On-going            Plan - 10/25/20 1217    Clinical Impression Statement Pt continues to present today with 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 Pt reports no overt s/sx dysphagia. Again today, Louis Peters did very well with speech compensations in conversational selections of 3 minutes each x6, and in conversation out doors. He agreed to reduce frequency to once every other week. SLP recommeds skilled ST to improve pt's intelligibility to decr his frustration with speaking.    Speech Therapy Frequency --   every other  week   Duration 8 weeks   or 17 sessions   Treatment/Interventions Aspiration precaution training;Pharyngeal strengthening exercises;Diet toleration management by SLP;Trials of upgraded texture/liquids;Cueing hierarchy;Patient/family education;Compensatory strategies;SLP instruction and feedback;Oral motor exercises;Functional tasks;Other (comment)    Potential to Achieve Goals Good    SLP Home Exercise Plan provided    Consulted and Agree with Plan of Care Patient           Patient will benefit from skilled therapeutic intervention in order to improve the following deficits and impairments:   Dysarthria and anarthria    Problem List Patient Active Problem List   Diagnosis Date Noted  . Hypertension   . Hypertensive urgency   . Ischemic stroke ([place])   . AKI (acute kidney injury) ([place]) 03/10/2019  . Heat Injury 03/10/2019  . Chronic kidney disease (CKD), stage III (moderate) ([place]) 03/10/2019  . Benign hypertension with chronic kidney disease, stage III ([place]) 03/10/2019  . Hypercholesterolemia 03/10/2019  . Acute kidney injury superimposed on chronic kidney disease ([place])   . Diabetes mellitus type 1, uncomplicated ([place]) [id number]    [place] ,[place], [place]  10/25/2020, 12:20 PM  [facility] [address] [place], [place], [address] Phone: [phone]   Fax:  [phone]   Name: Louis Peters MRN: [id number] Date of Birth: Aug 17, 1966

## 2020-10-26 ENCOUNTER — Encounter (HOSPITAL_COMMUNITY)
Admission: RE | Admit: 2020-10-26 | Discharge: 2020-10-26 | Disposition: A | Discharge status: Home / Self Care | Payer: Medicare Other | Source: Ambulatory Visit | Attending: Nephrology | Admitting: Nephrology

## 2020-10-26 VITALS — BP 149/80 | HR 88 | Temp 98.0°F

## 2020-10-26 DIAGNOSIS — N1832 Chronic kidney disease, stage 3b: Secondary | ICD-10-CM | POA: Diagnosis not present

## 2020-10-26 LAB — POCT HEMOGLOBIN-HEMACUE: Hemoglobin: 11 g/dL — ABNORMAL LOW (ref 13.0–17.0)

## 2020-10-26 MED ORDER — EPOETIN ALFA-EPBX 10000 UNIT/ML IJ SOLN
10000.0000 [IU] | INTRAMUSCULAR | Status: DC
  Administered 2020-10-26: 10000 [IU] via SUBCUTANEOUS

## 2020-10-26 MED ORDER — EPOETIN ALFA-EPBX 10000 UNIT/ML IJ SOLN
INTRAMUSCULAR | Status: AC
  Filled 2020-10-26: qty 1

## 2020-10-27 ENCOUNTER — Ambulatory Visit (HOSPITAL_COMMUNITY)
Admission: RE | Admit: 2020-10-27 | Discharge: 2020-10-27 | Disposition: A | Discharge status: Home / Self Care | Payer: Medicare Other | Source: Ambulatory Visit | Attending: Vascular Surgery | Admitting: Vascular Surgery

## 2020-10-27 ENCOUNTER — Other Ambulatory Visit: Payer: Self-pay

## 2020-10-27 ENCOUNTER — Ambulatory Visit (INDEPENDENT_AMBULATORY_CARE_PROVIDER_SITE_OTHER): Payer: Medicare Other | Admitting: Vascular Surgery

## 2020-10-27 ENCOUNTER — Ambulatory Visit (INDEPENDENT_AMBULATORY_CARE_PROVIDER_SITE_OTHER)
Admission: RE | Admit: 2020-10-27 | Discharge: 2020-10-27 | Disposition: A | Discharge status: Home / Self Care | Payer: Medicare Other | Source: Ambulatory Visit | Attending: Vascular Surgery | Admitting: Vascular Surgery

## 2020-10-27 ENCOUNTER — Encounter: Payer: Self-pay | Admitting: Vascular Surgery

## 2020-10-27 ENCOUNTER — Ambulatory Visit: Payer: Medicare Other | Admitting: Speech Pathology

## 2020-10-27 VITALS — BP 163/83 | HR 85 | Temp 98.1°F | Resp 20 | Ht 70.0 in | Wt 191.2 lb

## 2020-10-27 DIAGNOSIS — N186 End stage renal disease: Secondary | ICD-10-CM | POA: Insufficient documentation

## 2020-10-27 DIAGNOSIS — N184 Chronic kidney disease, stage 4 (severe): Secondary | ICD-10-CM | POA: Diagnosis not present

## 2020-10-27 NOTE — H&P (View-Only) (Signed)
REASON FOR CONSULT:    To evaluate for hemodialysis access.  The consult is requested by Dr. Carolin Sicks.   ASSESSMENT & PLAN:   STAGE IV CHRONIC KIDNEY DISEASE: This patient was referred for hemodialysis access.  Based on the vein map it looks like his only chance for a fistula would be a right brachiocephalic fistula.  We were asked to not place an AV graft if a fistula were not possible.  Based on his vein map I think he would be a candidate for a right brachiocephalic fistula.  He has a fairly large biceps of tunneling around the muscle may require harvesting a longer segment of vein to prevent any compression.  The surgeries been scheduled for 11/08/2020.  I have explained the indications for placement of an AV fistula or AV graft. I've explained that if at all possible we will place an AV fistula.  I have reviewed the risks of placement of an AV fistula including but not limited to: failure of the fistula to mature, need for subsequent interventions, and thrombosis. In addition I have reviewed the potential complications of placement of an AV graft. These risks include, but are not limited to, graft thrombosis, graft infection, wound healing problems, bleeding, arm swelling, and steal syndrome. All the patient's questions were answered and they are agreeable to proceed with surgery.  Deitra Mayo, MD Office: [phone]   HPI:   Louis Peters is a pleasant 55 y.o. male, who was referred for evaluation for hemodialysis access.  I have reviewed the records from the referring office.  On 09/06/2020 it was noted that if we were unable to place an AV fistula we should wait before placing an AV graft.  The patient has stage IV chronic kidney disease.  The patient was seen on 09/02/2020 and has longstanding uncontrolled hypertension with a hemoglobin A1c of greater than 10.  Patient has diabetic retinopathy, uncontrolled hypertension, hyperlipidemia and chronic kidney disease.  Previous MRI shows  polycystic kidney disease.  On my history, the patient is not on dialysis.  He does not have a pacemaker.  He is not on any blood thinners.  He is left-handed.  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  He denies any recent uremic symptoms.  Past Medical History:  Diagnosis Date  . Benign hypertension with chronic kidney disease, stage III ([place]) 03/10/2019  . Chronic kidney disease (CKD), stage III (moderate) ([place]) 03/10/2019  . Diabetes mellitus type 1, uncomplicated ([place]) 06/08/262  . Diabetes mellitus without complication ([place])   . Diabetic retinopathy ([place])    PDR OU  . Heat Injury 03/10/2019  . Hypercholesterolemia 03/10/2019  . Hypertension   . Retinal detachment    TRD OD    Family History  Problem Relation Age of Onset  . Diabetes Maternal Grandmother   . Diabetes Sister     SOCIAL HISTORY: Social History   Socioeconomic History  . Marital status: Married    Spouse name: Not on file  . Number of children: Not on file  . Years of education: Not on file  . Highest education level: Not on file  Occupational History  . Not on file  Tobacco Use  . Smoking status: Never Smoker  . Smokeless tobacco: Never Used  Vaping Use  . Vaping Use: Never used  Substance and Sexual Activity  . Alcohol use: Yes    Comment: occ  . Drug use: No  . Sexual activity: Not  on file  Other Topics Concern  . Not on file  Social History Narrative  . Not on file   Social Determinants of Health   Financial Resource Strain: Not on file  Food Insecurity: Not on file  Transportation Needs: Not on file  Physical Activity: Not on file  Stress: Not on file  Social Connections: Not on file  Intimate Partner Violence: Not on file    Allergies  Allergen Reactions  . Fexofenadine Rash    unknown unknown unknown  . Latex Rash    Current Outpatient Medications  Medication Sig Dispense Refill  . amLODipine (NORVASC) 10 MG tablet Take 10 mg by mouth  daily.     Marland Kitchen ammonium lactate (LAC-HYDRIN) 12 % lotion Apply topically.    Marland Kitchen aspirin EC 81 MG EC tablet Take 1 tablet (81 mg total) by mouth daily. Swallow whole. 30 tablet 11  . atorvastatin (LIPITOR) 80 MG tablet Take 1 tablet (80 mg total) by mouth daily. 30 tablet 0  . BAQSIMI ONE PACK 3 MG/DOSE POWD Place 1 spray into both nostrils as directed. For low Glucose    . Blood Glucose Monitoring Suppl (ONETOUCH VERIO FLEX SYSTEM) w/Device KIT by Does not apply route.    . cholecalciferol (VITAMIN D3) 25 MCG (1000 UNIT) tablet Take 1,000 Units by mouth daily.    . Continuous Blood Gluc Receiver (FREESTYLE LIBRE 2 READER) DEVI by Does not apply route.    Marland Kitchen glucagon 1 MG injection Inject 1 mg into the skin once as needed (low Glucose).     Marland Kitchen HUMALOG KWIKPEN 100 UNIT/ML KiwkPen Inject 5-8 Units into the skin 3 (three) times daily. Take 5 units at breakfast and lunch and Take 8 units at dinner.  0  . hydrALAZINE (APRESOLINE) 50 MG tablet Take 50 mg by mouth 3 (three) times daily.    . hydrochlorothiazide (HYDRODIURIL) 25 MG tablet Take 25 mg by mouth daily.    . insulin degludec (TRESIBA) 100 UNIT/ML SOPN FlexTouch Pen Inject 14 Units into the skin at bedtime.     . Insulin Disposable Pump (OMNIPOD DASH 5 PACK PODS) MISC REPLACE POD EVERY 72 HOURS    . Insulin Pen Needle 32G X 4 MM MISC Use with lantus and humalog 4 imes per day    . losartan (COZAAR) 100 MG tablet Take 100 mg by mouth daily.     . montelukast (SINGULAIR) 10 MG tablet Take 10 mg by mouth at bedtime.    . ONE TOUCH LANCETS MISC Use to check blood sugar 8 time(s) daily     No current facility-administered medications for this visit.   REVIEW OF SYSTEMS:  _0  denotes positive finding, _1  denotes negative finding Cardiac  Comments:  Chest pain or chest pressure:    Shortness of breath upon exertion:    Short of breath when lying flat:    Irregular heart rhythm:        Vascular    Pain in calf, thigh, or hip brought on by  ambulation: x   Pain in feet at night that wakes you up from your sleep:     Blood clot in your veins:    Leg swelling:  x       Pulmonary    Oxygen at home:    Productive cough:     Wheezing:         Neurologic    Sudden weakness in arms or legs:     Sudden numbness in arms or  legs:     Sudden onset of difficulty speaking or slurred speech:    Temporary loss of vision in one eye:     Problems with dizziness:         Gastrointestinal    Blood in stool:     Vomited blood:         Genitourinary    Burning when urinating:     Blood in urine:        Psychiatric    Major depression:         Hematologic    Bleeding problems:    Problems with blood clotting too easily:        Skin    Rashes or ulcers:        Constitutional    Fever or chills:     PHYSICAL EXAM:   Vitals:   10/27/20 1342  BP: (!) 163/83  Pulse: 85  Resp: 20  Temp: 98.1 F (36.7 C)  TempSrc: Temporal  SpO2: 97%  Weight: 191 lb 3.2 oz (86.7 kg)  Height: _0  (1.778 m)    GENERAL: The patient is a well-nourished male, in no acute distress. The vital signs are documented above. CARDIAC: There is a regular rate and rhythm.  VASCULAR: I do not detect carotid bruits. He has palpable brachial and radial pulses bilaterally. PULMONARY: There is good air exchange bilaterally without wheezing or rales. ABDOMEN: Soft and non-tender with normal pitched bowel sounds.  MUSCULOSKELETAL: There are no major deformities or cyanosis. NEUROLOGIC: No focal weakness or paresthesias are detected. SKIN: There are no ulcers or rashes noted. PSYCHIATRIC: The patient has a normal affect.  DATA:    ARTERIAL DOPPLER STUDY: I have independently interpreted his arterial Doppler study.  On the right side there is a triphasic radial and ulnar waveform.  The brachial artery measures 0.42 cm in diameter.  On the left side there is a triphasic radial and ulnar waveform.  The brachial artery measures 0.49 cm in  diameter.  UPPER EXTREMITY VEIN MAP: I have independently interpreted his upper extremity vein map.  On the right side the forearm cephalic vein is fairly small especially at the wrist.  The upper arm cephalic vein looks reasonable in size.  The basilic vein is marginal in size.  On the left side the forearm cephalic vein is not adequate in size.  The upper arm cephalic vein is marginal in size.  The basilic vein is small.  LABS: Most recent labs that I can find were from 07/23/1990.  His GFR at that time was 18.

## 2020-10-27 NOTE — Progress Notes (Deleted)
[facility] Note  10/28/2020                        CHIEF COMPLAINT Patient presents for No chief complaint on file.   HISTORY OF PRESENT ILLNESS: Louis Peters is a 55 y.o. male who presents to the clinic today for:   pt feels like the blood in his eye may be clearing a little bit, pts blood pressure is 200/130 this morning, pt states he is on 4 different pills for bp and did not take any of them this morning  Referring physician: Bernerd Limbo, MD [address] [place],  [place] [id number]  HISTORICAL INFORMATION:   Selected notes from the [facility] Referred by Dr. [name] for concern of retinal traction LEE: 09.25.19 (K. Hallahan) [BCVA: OD: CF_0  OS: 20/40] Ocular Hx-vitreous hemorrhage OS, traction detachment OD, PDR OU Previous eye docs: [name] Last visit w/ [name] was in 2016 -- was supposed to schedule surgery OS (PPV, MP, EL) but pt never followed up PMH-DM (last A1C: 11.1, takes humalog, lantus), HTN     CURRENT MEDICATIONS: No current outpatient medications on file. (Ophthalmic Drugs)   No current facility-administered medications for this visit. (Ophthalmic Drugs)   Current Outpatient Medications (Other)  Medication Sig  . amLODipine (NORVASC) 10 MG tablet Take 10 mg by mouth daily.   Marland Kitchen ammonium lactate (LAC-HYDRIN) 12 % lotion Apply topically.  Marland Kitchen aspirin EC 81 MG EC tablet Take 1 tablet (81 mg total) by mouth daily. Swallow whole.  Marland Kitchen atorvastatin (LIPITOR) 80 MG tablet Take 1 tablet (80 mg total) by mouth daily.  Marland Kitchen BAQSIMI ONE PACK 3 MG/DOSE POWD Place 1 spray into both nostrils as directed. For low Glucose  . Blood Glucose Monitoring Suppl (ONETOUCH VERIO FLEX SYSTEM) w/Device KIT by Does not apply route.  . cholecalciferol (VITAMIN D3) 25 MCG (1000 UNIT) tablet Take 1,000 Units by mouth daily.  . Continuous Blood Gluc Receiver (FREESTYLE LIBRE 2 READER) DEVI by Does not apply  route.  Marland Kitchen glucagon 1 MG injection Inject 1 mg into the skin once as needed (low Glucose).   Marland Kitchen HUMALOG KWIKPEN 100 UNIT/ML KiwkPen Inject 5-8 Units into the skin 3 (three) times daily. Take 5 units at breakfast and lunch and Take 8 units at dinner.  . hydrALAZINE (APRESOLINE) 50 MG tablet Take 50 mg by mouth 3 (three) times daily.  . hydrochlorothiazide (HYDRODIURIL) 25 MG tablet Take 25 mg by mouth daily.  . insulin degludec (TRESIBA) 100 UNIT/ML SOPN FlexTouch Pen Inject 14 Units into the skin at bedtime.   . Insulin Disposable Pump (OMNIPOD DASH 5 PACK PODS) MISC REPLACE POD EVERY 72 HOURS  . Insulin Pen Needle 32G X 4 MM MISC Use with lantus and humalog 4 imes per day  . losartan (COZAAR) 100 MG tablet Take 100 mg by mouth daily.   . montelukast (SINGULAIR) 10 MG tablet Take 10 mg by mouth at bedtime.  . ONE TOUCH LANCETS MISC Use to check blood sugar 8 time(s) daily   No current facility-administered medications for this visit. (Other)      REVIEW OF SYSTEMS:    ALLERGIES Allergies  Allergen Reactions  . Fexofenadine Rash    unknown unknown unknown  . Latex Rash    PAST MEDICAL HISTORY Past Medical History:  Diagnosis Date  . Benign hypertension with chronic kidney disease, stage III ([place]) 03/10/2019  .  Chronic kidney disease (CKD), stage III (moderate) ([place]) 03/10/2019  . Diabetes mellitus type 1, uncomplicated ([place]) 10/07/567  . Diabetes mellitus without complication ([place])   . Diabetic retinopathy ([place])    PDR OU  . Heat Injury 03/10/2019  . Hypercholesterolemia 03/10/2019  . Hypertension   . Retinal detachment    TRD OD   Past Surgical History:  Procedure Laterality Date  . CATARACT EXTRACTION    . EYE SURGERY    . RETINAL DETACHMENT SURGERY      FAMILY HISTORY Family History  Problem Relation Age of Onset  . Diabetes Maternal Grandmother   . Diabetes Sister     SOCIAL HISTORY Social History   Tobacco Use  . Smoking status: Never Smoker  . Smokeless  tobacco: Never Used  Vaping Use  . Vaping Use: Never used  Substance Use Topics  . Alcohol use: Yes    Comment: occ  . Drug use: No         OPHTHALMIC EXAM:  Not recorded     IMAGING AND PROCEDURES  Imaging and Procedures for _0 @           ASSESSMENT/PLAN:    ICD-10-CM   1. 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 Retinal edema  H35.81   3. 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 Essential hypertension  I10   5. Hypertensive retinopathy of both eyes  H35.033   6. Combined forms of age-related cataract of both eyes  H25.813     1,2. Proliferative diabetic retinopathy OU OD -- chronic, macula-involving TRD -- 20/400 vision -- severe retinal ischemia OS -- vitreous traction with macular edema / retinoschisis of superior macula -- BCVA 20/30-2 - former pt of Dr. Zadie Rhine in 2016 -- records reviewed, at that time, OD was already detached with VA 20/400, OS was to undergo PPV/MP/EL, but pt never had the surgery or followed up with [name] - FA (01.13.20) shows active NVE OS, OD with minimal leakage  - OCT shows chronic mac off TRD OD, and OS with tractional retinoschisis / macular edema of superior macula -- mild interval improvement today - VA 20/25 OS - S/P PRP fill-in OS (10.01.19), fill-in (01.13.20) - new onset vitreous hemorrhage following previous laser, continues to clear - discussed possible need for surgery in the future - F/U 2-3 week, OCT, DFE - OS may eventually need PPV / MP to relieve tractional retinoschisis, but with VA 20/25, will monitor for now  3. Chronic TRD OD-  - fovea involving chronic TRD OD -- periphery attached by laser PRP - severe ischemia / arteriolar sclerosis OD -- low vision potential - review of records for [name]  indicate mac off TRD has been present since early 2016 -- now 3+ years - discussed findings and poor prognosis - do not recommend surgical intervention OD -- high risk, low benefit - pt not interested in surgical intervention at this time - monitor  4,5. Hypertensive retinopathy OU - discussed importance of tight BP control - monitor  6. Combined form age related cataracts OU- - The symptoms of cataract, surgical options, and treatments and risks were discussed with patient. - discussed diagnosis and progression - not yet visually significant - monitor   Ophthalmic Meds Ordered this visit:  No orders of the defined types were placed in this  encounter.      No follow-ups on file.  There are no Patient Instructions on file for this visit.   Explained the diagnoses, plan, and follow up with the patient and they expressed understanding.  Patient expressed understanding of the importance of proper follow up care.   This document serves as a record of services personally performed by Gardiner Sleeper, MD, PhD. It was created on their behalf by Leonie Douglas, an ophthalmic technician. The creation of this record is the provider's dictation and/or activities during the visit.    Electronically signed by: Leonie Douglas COA, 10/27/20  4:27 PM     Gardiner Sleeper, M.D., Ph.D. Diseases & Surgery of the Retina and Vitreous [facility]: M myopia (nearsighted); A astigmatism; H hyperopia (farsighted); P presbyopia; Mrx spectacle prescription;  CTL contact lenses; OD right eye; OS left eye; OU both eyes  XT exotropia; ET esotropia; PEK punctate epithelial keratitis; PEE punctate epithelial erosions; DES dry eye syndrome; MGD meibomian gland dysfunction; ATs artificial tears; PFAT's preservative free artificial tears; [place] nuclear sclerotic cataract; PSC posterior subcapsular cataract; ERM epi-retinal membrane; PVD posterior vitreous detachment; RD  retinal detachment; DM diabetes mellitus; DR diabetic retinopathy; NPDR non-proliferative diabetic retinopathy; PDR proliferative diabetic retinopathy; CSME clinically significant macular edema; DME diabetic macular edema; dbh dot blot hemorrhages; CWS cotton wool spot; POAG primary open angle glaucoma; C/D cup-to-disc ratio; HVF humphrey visual field; GVF goldmann visual field; OCT optical coherence tomography; IOP intraocular pressure; BRVO Branch retinal vein occlusion; CRVO central retinal vein occlusion; CRAO central retinal artery occlusion; BRAO branch retinal artery occlusion; RT retinal tear; SB scleral buckle; PPV pars plana vitrectomy; VH Vitreous hemorrhage; PRP panretinal laser photocoagulation; IVK intravitreal kenalog; VMT vitreomacular traction; MH Macular hole;  NVD neovascularization of the disc; NVE neovascularization elsewhere; AREDS age related eye disease study; ARMD age related macular degeneration; POAG primary open angle glaucoma; EBMD epithelial/anterior basement membrane dystrophy; ACIOL anterior chamber intraocular lens; IOL intraocular lens; PCIOL posterior chamber intraocular lens; Phaco/IOL phacoemulsification with intraocular lens placement; [place] photorefractive keratectomy; LASIK laser assisted in situ keratomileusis; HTN hypertension; DM diabetes mellitus; COPD chronic obstructive pulmonary disease

## 2020-10-27 NOTE — Progress Notes (Signed)
 REASON FOR CONSULT:    To evaluate for hemodialysis access.  The consult is requested by Dr. Bhandari.   ASSESSMENT & PLAN:   STAGE IV CHRONIC KIDNEY DISEASE: This patient was referred for hemodialysis access.  Based on the vein map it looks like his only chance for a fistula would be a right brachiocephalic fistula.  We were asked to not place an AV graft if a fistula were not possible.  Based on his vein map I think he would be a candidate for a right brachiocephalic fistula.  He has a fairly large biceps of tunneling around the muscle may require harvesting a longer segment of vein to prevent any compression.  The surgeries been scheduled for 11/08/2020.  I have explained the indications for placement of an AV fistula or AV graft. I've explained that if at all possible we will place an AV fistula.  I have reviewed the risks of placement of an AV fistula including but not limited to: failure of the fistula to mature, need for subsequent interventions, and thrombosis. In addition I have reviewed the potential complications of placement of an AV graft. These risks include, but are not limited to, graft thrombosis, graft infection, wound healing problems, bleeding, arm swelling, and steal syndrome. All the patient's questions were answered and they are agreeable to proceed with surgery.  Louis Arzuaga, MD Office: [phone]   HPI:   Louis Peters is a pleasant 55 y.o. male, who was referred for evaluation for hemodialysis access.  I have reviewed the records from the referring office.  On 09/06/2020 it was noted that if we were unable to place an AV fistula we should wait before placing an AV graft.  The patient has stage IV chronic kidney disease.  The patient was seen on 09/02/2020 and has longstanding uncontrolled hypertension with a hemoglobin A1c of greater than 10.  Patient has diabetic retinopathy, uncontrolled hypertension, hyperlipidemia and chronic kidney disease.  Previous MRI shows  polycystic kidney disease.  On my history, the patient is not on dialysis.  He does not have a pacemaker.  He is not on any blood thinners.  He is left-handed.  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  He denies any recent uremic symptoms.  Past Medical History:  Diagnosis Date  . Benign hypertension with chronic kidney disease, stage III (H[facility]) 03/10/2019  . Chronic kidney disease (CKD), stage III (moderate) ([facility]) 03/10/2019  . Diabetes mellitus type 1, uncomplicated ([facility]) 12/09/2015  . Diabetes mellitus without complication ([facility])   . Diabetic retinopathy ([facility])    PDR OU  . Heat Injury 03/10/2019  . Hypercholesterolemia 03/10/2019  . Hypertension   . Retinal detachment    TRD OD    Family History  Problem Relation Age of Onset  . Diabetes Maternal Grandmother   . Diabetes Sister     SOCIAL HISTORY: Social History   Socioeconomic History  . Marital status: Married    Spouse name: Not on file  . Number of children: Not on file  . Years of education: Not on file  . Highest education level: Not on file  Occupational History  . Not on file  Tobacco Use  . Smoking status: Never Smoker  . Smokeless tobacco: Never Used  Vaping Use  . Vaping Use: Never used  Substance and Sexual Activity  . Alcohol use: Yes    Comment: occ  . Drug use: No  . Sexual activity: Not   on file  Other Topics Concern  . Not on file  Social History Narrative  . Not on file   Social Determinants of Health   Financial Resource Strain: Not on file  Food Insecurity: Not on file  Transportation Needs: Not on file  Physical Activity: Not on file  Stress: Not on file  Social Connections: Not on file  Intimate Partner Violence: Not on file    Allergies  Allergen Reactions  . Fexofenadine Rash    unknown unknown unknown  . Latex Rash    Current Outpatient Medications  Medication Sig Dispense Refill  . amLODipine (NORVASC) 10 MG tablet Take 10 mg by mouth  daily.     . ammonium lactate (LAC-HYDRIN) 12 % lotion Apply topically.    . aspirin EC 81 MG EC tablet Take 1 tablet (81 mg total) by mouth daily. Swallow whole. 30 tablet 11  . atorvastatin (LIPITOR) 80 MG tablet Take 1 tablet (80 mg total) by mouth daily. 30 tablet 0  . BAQSIMI ONE PACK 3 MG/DOSE POWD Place 1 spray into both nostrils as directed. For low Glucose    . Blood Glucose Monitoring Suppl (ONETOUCH VERIO FLEX SYSTEM) w/Device KIT by Does not apply route.    . cholecalciferol (VITAMIN D3) 25 MCG (1000 UNIT) tablet Take 1,000 Units by mouth daily.    . Continuous Blood Gluc Receiver (FREESTYLE LIBRE 2 READER) DEVI by Does not apply route.    . glucagon 1 MG injection Inject 1 mg into the skin once as needed (low Glucose).     . HUMALOG KWIKPEN 100 UNIT/ML KiwkPen Inject 5-8 Units into the skin 3 (three) times daily. Take 5 units at breakfast and lunch and Take 8 units at dinner.  0  . hydrALAZINE (APRESOLINE) 50 MG tablet Take 50 mg by mouth 3 (three) times daily.    . hydrochlorothiazide (HYDRODIURIL) 25 MG tablet Take 25 mg by mouth daily.    . insulin degludec (TRESIBA) 100 UNIT/ML SOPN FlexTouch Pen Inject 14 Units into the skin at bedtime.     . Insulin Disposable Pump (OMNIPOD DASH 5 PACK PODS) MISC REPLACE POD EVERY 72 HOURS    . Insulin Pen Needle 32G X 4 MM MISC Use with lantus and humalog 4 imes per day    . losartan (COZAAR) 100 MG tablet Take 100 mg by mouth daily.     . montelukast (SINGULAIR) 10 MG tablet Take 10 mg by mouth at bedtime.    . ONE TOUCH LANCETS MISC Use to check blood sugar 8 time(s) daily     No current facility-administered medications for this visit.   REVIEW OF SYSTEMS:  [X] denotes positive finding, [ ] denotes negative finding Cardiac  Comments:  Chest pain or chest pressure:    Shortness of breath upon exertion:    Short of breath when lying flat:    Irregular heart rhythm:        Vascular    Pain in calf, thigh, or hip brought on by  ambulation: x   Pain in feet at night that wakes you up from your sleep:     Blood clot in your veins:    Leg swelling:  x       Pulmonary    Oxygen at home:    Productive cough:     Wheezing:         Neurologic    Sudden weakness in arms or legs:     Sudden numbness in arms or   legs:     Sudden onset of difficulty speaking or slurred speech:    Temporary loss of vision in one eye:     Problems with dizziness:         Gastrointestinal    Blood in stool:     Vomited blood:         Genitourinary    Burning when urinating:     Blood in urine:        Psychiatric    Major depression:         Hematologic    Bleeding problems:    Problems with blood clotting too easily:        Skin    Rashes or ulcers:        Constitutional    Fever or chills:     PHYSICAL EXAM:   Vitals:   10/27/20 1342  BP: (!) 163/83  Pulse: 85  Resp: 20  Temp: 98.1 F (36.7 C)  TempSrc: Temporal  SpO2: 97%  Weight: 191 lb 3.2 oz (86.7 kg)  Height: 5' 10" (1.778 m)    GENERAL: The patient is a well-nourished male, in no acute distress. The vital signs are documented above. CARDIAC: There is a regular rate and rhythm.  VASCULAR: I do not detect carotid bruits. He has palpable brachial and radial pulses bilaterally. PULMONARY: There is good air exchange bilaterally without wheezing or rales. ABDOMEN: Soft and non-tender with normal pitched bowel sounds.  MUSCULOSKELETAL: There are no major deformities or cyanosis. NEUROLOGIC: No focal weakness or paresthesias are detected. SKIN: There are no ulcers or rashes noted. PSYCHIATRIC: The patient has a normal affect.  DATA:    ARTERIAL DOPPLER STUDY: I have independently interpreted his arterial Doppler study.  On the right side there is a triphasic radial and ulnar waveform.  The brachial artery measures 0.42 cm in diameter.  On the left side there is a triphasic radial and ulnar waveform.  The brachial artery measures 0.49 cm in  diameter.  UPPER EXTREMITY VEIN MAP: I have independently interpreted his upper extremity vein map.  On the right side the forearm cephalic vein is fairly small especially at the wrist.  The upper arm cephalic vein looks reasonable in size.  The basilic vein is marginal in size.  On the left side the forearm cephalic vein is not adequate in size.  The upper arm cephalic vein is marginal in size.  The basilic vein is small.  LABS: Most recent labs that I can find were from 07/23/1990.  His GFR at that time was 18. 

## 2020-10-28 ENCOUNTER — Other Ambulatory Visit: Payer: Self-pay

## 2020-10-28 ENCOUNTER — Encounter (INDEPENDENT_AMBULATORY_CARE_PROVIDER_SITE_OTHER): Payer: Self-pay | Admitting: Ophthalmology

## 2020-10-28 ENCOUNTER — Ambulatory Visit (INDEPENDENT_AMBULATORY_CARE_PROVIDER_SITE_OTHER): Payer: Medicare Other | Admitting: Ophthalmology

## 2020-10-28 DIAGNOSIS — E113512 Type 2 diabetes mellitus with proliferative diabetic retinopathy with macular edema, left eye: Secondary | ICD-10-CM | POA: Diagnosis not present

## 2020-10-28 DIAGNOSIS — E113521 Type 2 diabetes mellitus with proliferative diabetic retinopathy with traction retinal detachment involving the macula, right eye: Secondary | ICD-10-CM | POA: Diagnosis not present

## 2020-10-28 DIAGNOSIS — H3581 Retinal edema: Secondary | ICD-10-CM | POA: Diagnosis not present

## 2020-10-28 DIAGNOSIS — I10 Essential (primary) hypertension: Secondary | ICD-10-CM | POA: Diagnosis not present

## 2020-10-28 DIAGNOSIS — H25813 Combined forms of age-related cataract, bilateral: Secondary | ICD-10-CM

## 2020-10-28 DIAGNOSIS — H35033 Hypertensive retinopathy, bilateral: Secondary | ICD-10-CM

## 2020-10-28 NOTE — Progress Notes (Signed)
[facility] Note  10/28/2020                        CHIEF COMPLAINT Patient presents for Retina Follow Up   HISTORY OF PRESENT ILLNESS: Louis Peters is a 55 y.o. male who presents to the clinic today for:   HPI    Retina Follow Up    Patient presents with  Diabetic Retinopathy.  In both eyes.  This started years ago.  Severity is moderate.  Duration of 2 years.  Since onset it is stable.  I, the attending physician,  performed the HPI with the patient and updated documentation appropriately.          Comments    55 y/o male pt referred back by Dr. Hinton Lovely for eval of PDR OU.  Pt last seen here 2.12.20.  No change in [place] OU noticed.  Denies pain, FOL, floaters.  No gtts.  BS 53 this a.m.  A1C 11.0.       Last edited by [name], MD on 11/01/2020  4:30 PM. (History)    previous pt being referred back by Dr. Herbert Deaner, pt states his left eye is a little blurry, pt had a stroke in October 2021  Referring physician: Monna Fam, [place],  [place] [address]  HISTORICAL INFORMATION:   Selected notes from the [facility] Referred by Dr. [name] for concern of retinal traction LEE: 09.25.19 ([facility]. [facility]) [BCVA: OD: CF_0  OS: 20/40] Ocular Hx-vitreous hemorrhage OS, traction detachment OD, PDR OU Previous eye docs: [name] Last visit w/ [name] was in 2016 -- was supposed to schedule surgery OS (PPV, MP, EL) but pt never followed up PMH-DM (last A1C: 11.1, takes humalog, lantus), HTN     CURRENT MEDICATIONS: No current outpatient medications on file. (Ophthalmic Drugs)   No current facility-administered medications for this visit. (Ophthalmic Drugs)   Current Outpatient Medications (Other)  Medication Sig  . amLODipine (NORVASC) 10 MG tablet Take 10 mg by mouth daily.   Marland Kitchen ammonium lactate (LAC-HYDRIN) 12 % lotion Apply topically.  Marland Kitchen aspirin EC 81 MG EC tablet Take 1 tablet (81 mg total) by  mouth daily. Swallow whole.  Marland Kitchen atorvastatin (LIPITOR) 80 MG tablet Take 1 tablet (80 mg total) by mouth daily.  Marland Kitchen BAQSIMI ONE PACK 3 MG/DOSE POWD Place 1 spray into both nostrils as directed. For low Glucose  . Blood Glucose Monitoring Suppl (ONETOUCH VERIO FLEX SYSTEM) w/Device KIT by Does not apply route.  . cholecalciferol (VITAMIN D3) 25 MCG (1000 UNIT) tablet Take 1,000 Units by mouth daily.  . Continuous Blood Gluc Receiver (FREESTYLE LIBRE 2 READER) DEVI by Does not apply route.  Marland Kitchen glucagon 1 MG injection Inject 1 mg into the skin once as needed (low Glucose).   Marland Kitchen HUMALOG KWIKPEN 100 UNIT/ML KiwkPen Inject 5-8 Units into the skin 3 (three) times daily. Take 5 units at breakfast and lunch and Take 8 units at dinner.  . hydrALAZINE (APRESOLINE) 50 MG tablet Take 50 mg by mouth 3 (three) times daily.  . hydrochlorothiazide (HYDRODIURIL) 25 MG tablet Take 25 mg by mouth daily.  . insulin degludec (TRESIBA) 100 UNIT/ML SOPN FlexTouch Pen Inject 14 Units into the skin at bedtime.   . Insulin Disposable Pump (OMNIPOD DASH 5 PACK PODS) MISC REPLACE POD EVERY 72 HOURS  . Insulin Pen Needle 32G X 4 MM MISC Use with lantus and  humalog 4 imes per day  . losartan (COZAAR) 100 MG tablet Take 100 mg by mouth daily.   . montelukast (SINGULAIR) 10 MG tablet Take 10 mg by mouth at bedtime.  . ONE TOUCH LANCETS MISC Use to check blood sugar 8 time(s) daily   No current facility-administered medications for this visit. (Other)      REVIEW OF SYSTEMS: ROS    Positive for: Genitourinary, Endocrine, Eyes   Negative for: Constitutional, Gastrointestinal, Neurological, Skin, Musculoskeletal, HENT, Cardiovascular, Respiratory, Psychiatric, Allergic/Imm, Heme/Lymph   Last edited by Matthew Folks, COA on 10/28/2020  8:44 AM. (History)       ALLERGIES Allergies  Allergen Reactions  . Fexofenadine Rash    unknown unknown unknown  . Latex Rash    PAST MEDICAL HISTORY Past Medical History:   Diagnosis Date  . Benign hypertension with chronic kidney disease, stage III ([place]) 03/10/2019  . Chronic kidney disease (CKD), stage III (moderate) ([place]) 03/10/2019  . Diabetes mellitus type 1, uncomplicated ([place]) 12/31/299  . Diabetes mellitus without complication ([place])   . Diabetic retinopathy ([place])    PDR OU  . Heat Injury 03/10/2019  . Hypercholesterolemia 03/10/2019  . Hypertension   . Retinal detachment    TRD OD   Past Surgical History:  Procedure Laterality Date  . CATARACT EXTRACTION    . EYE SURGERY    . RETINAL DETACHMENT SURGERY      FAMILY HISTORY Family History  Problem Relation Age of Onset  . Diabetes Maternal Grandmother   . Diabetes Sister     SOCIAL HISTORY Social History   Tobacco Use  . Smoking status: Never Smoker  . Smokeless tobacco: Never Used  Vaping Use  . Vaping Use: Never used  Substance Use Topics  . Alcohol use: Yes    Comment: occ  . Drug use: No         OPHTHALMIC EXAM:  Base Eye Exam    Visual Acuity (Snellen - Linear)      Right Left   Dist cc 20/200 - 20/25 -2   Dist ph cc NI NI   Correction: Glasses       Tonometry (Tonopen, 8:46 AM)      Right Left   Pressure 15 14       Pupils      Dark Light Shape React APD   Right 4 3 Round Brisk None   Left 4 3 Round Brisk None       Visual Fields (Counting fingers)      Left Right    Full    Restrictions  Partial outer superior temporal deficiency       Extraocular Movement      Right Left    Full, Ortho Full, Ortho       Neuro/Psych    Oriented x3: Yes   Mood/Affect: Normal       Dilation    Both eyes: 1.0% Mydriacyl, 2.5% Phenylephrine @ 8:46 AM        Slit Lamp and Fundus Exam    Slit Lamp Exam      Right Left   Lids/Lashes Dermatochalasis - upper lid, Meibomian gland dysfunction Dermatochalasis - upper lid, Meibomian gland dysfunction   Conjunctiva/Sclera Melanosis Melanosis   Cornea Arcus Arcus   Anterior Chamber Deep and quiet Deep and quiet   Iris  Round and dilated, No NVI Round and dilated, No NVI   Lens 2-3+ Nuclear sclerosis, 2-3+ Cortical cataract 2-3+ Nuclear sclerosis, 3+ Cortical cataract  Vitreous Vitreous syneresis VH improved centrally, boat shaped heme below inferior arcade, mild blood clots inferiorly       Fundus Exam      Right Left   Disc 1-2+ Pallor, fibrosis eminating to ST arcade, Sharp rim sharp rim, mild pallor   C/D Ratio 0.0 0.4   Macula TRD with vertical fibrotic band extending from ST to IT arcades, tractional fibrosis -- stable from prior Flat, blunted foveal reflex, +edema superior macula, Retinal pigment epithelial mottling   Vessels Severe Vascular attenuation, sclerotic arterioles, +fibrotic NV along temporal arcades - inactive Vascular attenuation, early arteriolar sclerosis, fibrosis along temporal arcades, fibrotic NVE along distal IT arcade -- regressing   Periphery Attached peripherally with 360 PRP laser  Attached, tractional retinoschisis along superior arcades; 360 PRP with good posterior fill in superiorly towards schisis, room for fill in           IMAGING AND PROCEDURES  Imaging and Procedures for _0 @  OCT, Retina - OU - Both Eyes       Right Eye Quality was good. Central Foveal Thickness: 280 (Unable to obtain). Progression has been stable. 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   Left Eye Quality was good. Central Foveal Thickness: 212. Progression has improved. Findings include abnormal foveal contour, no SRF, intraretinal fluid, vitreous traction (Mild Interval improvement in vitreous opacities and tractional schisis).   Notes *Images captured and stored on drive  Diagnosis / Impression:  OD: chronic TRD, +SRF OS: non-central VMT along superotemporal arcades with tractional retinoschisis; mild  interval improvement in vitreous opacities and tractional schisis  Clinical management:  See below  Abbreviations: NFP - Normal foveal profile. CME - cystoid macular edema. PED - pigment epithelial detachment. IRF - intraretinal fluid. SRF - subretinal fluid. EZ - ellipsoid zone. ERM - epiretinal membrane. ORA - outer retinal atrophy. ORT - outer retinal tubulation. SRHM - subretinal hyper-reflective material                  ASSESSMENT/PLAN:    ICD-10-CM   1. 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 Retinal edema  H35.81 OCT, Retina - OU - Both Eyes  3. Right eye affected by proliferative diabetic retinopathy with traction retinal detachment involving macula, associated with type 2 diabetes mellitus ([place])  T24.5809   4. Essential hypertension  I10   5. Hypertensive retinopathy of both eyes  H35.033   6. Combined forms of age-related cataract of both eyes  H25.813     1,2. Proliferative diabetic retinopathy OU  - pt lost to f/u here since 02.12.2020  - OD -- chronic, macula-involving TRD -- 20/400 vision -- severe retinal ischemia  - OS -- vitreous traction with macular edema / retinoschisis of superior macula -- BCVA 20/30-2  - former pt of Dr. Zadie Rhine in 2016 -- records reviewed, at that time, OD was already detached with VA 20/400, OS was to undergo PPV/MP/EL, but pt never had the surgery or followed up with [name]  - FA (01.13.20) shows active NVE OS, OD with minimal leakage   - OCT shows chronic mac off TRD OD, and OS with tractional retinoschisis / macular edema of superior macula -- mild interval improvement today compared to 2020 study  - VA 20/25 OS  - S/P PRP fill-in OS (10.01.19), fill-in (01.13.20)  -  new onset vitreous hemorrhage following previous laser, improved today  - discussed possible need for surgery in the future  - F/U 3-4 weeks, OCT, DFE, FA transit OS  - OS may  eventually need PPV / MP to relieve tractional retinoschisis, but with VA 20/25, will monitor for now  3. Chronic TRD OD-   - fovea involving chronic TRD OD -- periphery attached by laser PRP  - severe ischemia / arteriolar sclerosis OD -- low vision potential  - review of records for [name] indicate mac off TRD has been present since early 2016 -- now 3+ years  - discussed findings and poor prognosis  - do not recommend surgical intervention OD -- high risk, low benefit  - pt not interested in surgical intervention at this time  - monitor  4,5. Hypertensive retinopathy OU  - discussed importance of tight BP control  - monitor  6. Combined form age related cataracts OU-  - The symptoms of cataract, surgical options, and treatments and risks were discussed with patient.  - discussed diagnosis and progression  - not yet visually significant  - monitor   Ophthalmic Meds Ordered this visit:  No orders of the defined types were placed in this encounter.      Return for f/u 3-4 weeks, PDR OU, DFE, OCT, Fluorescein Angiogram.  There are no Patient Instructions on file for this visit.   Explained the diagnoses, plan, and follow up with the patient and they expressed understanding.  Patient expressed understanding of the importance of proper follow up care.   This document serves as a record of services personally performed by Gardiner Sleeper, MD, PhD. It was created on their behalf by San Jetty. Owens Shark, OA an ophthalmic technician. The creation of this record is the provider's dictation and/or activities during the visit.    Electronically signed by: San Jetty. Owens Shark, [place] 01.27.2022 4:32 PM  Gardiner Sleeper, M.D., Ph.D. Diseases & Surgery of the Retina and Vitreous [facility]  I have reviewed the above documentation for accuracy and completeness, and I agree with the above. Gardiner Sleeper, M.D., Ph.D. 11/01/20 4:32 PM  Abbreviations: M myopia (nearsighted);  A astigmatism; H hyperopia (farsighted); P presbyopia; Mrx spectacle prescription;  CTL contact lenses; OD right eye; OS left eye; OU both eyes  XT exotropia; ET esotropia; PEK punctate epithelial keratitis; PEE punctate epithelial erosions; DES dry eye syndrome; MGD meibomian gland dysfunction; ATs artificial tears; PFAT's preservative free artificial tears; [place] nuclear sclerotic cataract; PSC posterior subcapsular cataract; ERM epi-retinal membrane; PVD posterior vitreous detachment; RD retinal detachment; DM diabetes mellitus; DR diabetic retinopathy; NPDR non-proliferative diabetic retinopathy; PDR proliferative diabetic retinopathy; CSME clinically significant macular edema; DME diabetic macular edema; dbh dot blot hemorrhages; CWS cotton wool spot; POAG primary open angle glaucoma; C/D cup-to-disc ratio; HVF humphrey visual field; GVF goldmann visual field; OCT optical coherence tomography; IOP intraocular pressure; BRVO Branch retinal vein occlusion; CRVO central retinal vein occlusion; CRAO central retinal artery occlusion; BRAO branch retinal artery occlusion; RT retinal tear; SB scleral buckle; PPV pars plana vitrectomy; VH Vitreous hemorrhage; PRP panretinal laser photocoagulation; IVK intravitreal kenalog; VMT vitreomacular traction; MH Macular hole;  NVD neovascularization of the disc; NVE neovascularization elsewhere; AREDS age related eye disease study; ARMD age related macular degeneration; POAG primary open angle glaucoma; EBMD epithelial/anterior basement membrane dystrophy; ACIOL anterior chamber intraocular lens; IOL intraocular lens; PCIOL posterior chamber intraocular lens; Phaco/IOL phacoemulsification with intraocular lens placement; PRK photorefractive keratectomy; LASIK laser  assisted in situ keratomileusis; HTN hypertension; DM diabetes mellitus; COPD chronic obstructive pulmonary disease

## 2020-10-28 NOTE — Progress Notes (Incomplete)
[facility] Note  10/28/2020                        CHIEF COMPLAINT Patient presents for No chief complaint on file.   HISTORY OF PRESENT ILLNESS: BOB DAVERSA is a 55 y.o. male who presents to the clinic today for:   pt feels like the blood in his eye may be clearing a little bit, pts blood pressure is 200/130 this morning, pt states he is on 4 different pills for bp and did not take any of them this morning  Referring physician: Bernerd Limbo, MD [address] [place],   [phone]  HISTORICAL INFORMATION:   Selected notes from the [facility] Referred by Dr. [name] for concern of retinal traction LEE: 09.25.19 ([facility]. [facility]) [BCVA: OD: CF_0  OS: 20/40] Ocular Hx-vitreous hemorrhage OS, traction detachment OD, PDR OU Previous eye docs: [name] Last visit w/ [name] was in 2016 -- was supposed to schedule surgery OS (PPV, MP, EL) but pt never followed up PMH-DM (last A1C: 11.1, takes humalog, lantus), HTN     CURRENT MEDICATIONS: No current outpatient medications on file. (Ophthalmic Drugs)   No current facility-administered medications for this visit. (Ophthalmic Drugs)   Current Outpatient Medications (Other)  Medication Sig  . amLODipine (NORVASC) 10 MG tablet Take 10 mg by mouth daily.   Marland Kitchen ammonium lactate (LAC-HYDRIN) 12 % lotion Apply topically.  Marland Kitchen aspirin EC 81 MG EC tablet Take 1 tablet (81 mg total) by mouth daily. Swallow whole.  Marland Kitchen atorvastatin (LIPITOR) 80 MG tablet Take 1 tablet (80 mg total) by mouth daily.  Marland Kitchen BAQSIMI ONE PACK 3 MG/DOSE POWD Place 1 spray into both nostrils as directed. For low Glucose  . Blood Glucose Monitoring Suppl (ONETOUCH VERIO FLEX SYSTEM) w/Device KIT by Does not apply route.  . cholecalciferol (VITAMIN D3) 25 MCG (1000 UNIT) tablet Take 1,000 Units by mouth daily.  . Continuous Blood Gluc Receiver (FREESTYLE LIBRE 2 READER) DEVI by Does not apply  route.  Marland Kitchen glucagon 1 MG injection Inject 1 mg into the skin once as needed (low Glucose).   Marland Kitchen HUMALOG KWIKPEN 100 UNIT/ML KiwkPen Inject 5-8 Units into the skin 3 (three) times daily. Take 5 units at breakfast and lunch and Take 8 units at dinner.  . hydrALAZINE (APRESOLINE) 50 MG tablet Take 50 mg by mouth 3 (three) times daily.  . hydrochlorothiazide (HYDRODIURIL) 25 MG tablet Take 25 mg by mouth daily.  . insulin degludec (TRESIBA) 100 UNIT/ML SOPN FlexTouch Pen Inject 14 Units into the skin at bedtime.   . Insulin Disposable Pump (OMNIPOD DASH 5 PACK PODS) MISC REPLACE POD EVERY 72 HOURS  . Insulin Pen Needle 32G X 4 MM MISC Use with lantus and humalog 4 imes per day  . losartan (COZAAR) 100 MG tablet Take 100 mg by mouth daily.   . montelukast (SINGULAIR) 10 MG tablet Take 10 mg by mouth at bedtime.  . ONE TOUCH LANCETS MISC Use to check blood sugar 8 time(s) daily   No current facility-administered medications for this visit. (Other)      REVIEW OF SYSTEMS:    ALLERGIES Allergies  Allergen Reactions  . Fexofenadine Rash    unknown unknown unknown  . Latex Rash    PAST MEDICAL HISTORY Past Medical History:  Diagnosis Date  . Benign hypertension with chronic kidney disease, stage III ([place]) 03/10/2019  .  Chronic kidney disease (CKD), stage III (moderate) ([place]) 03/10/2019  . Diabetes mellitus type 1, uncomplicated ([place]) 10/07/567  . Diabetes mellitus without complication ([place])   . Diabetic retinopathy ([place])    PDR OU  . Heat Injury 03/10/2019  . Hypercholesterolemia 03/10/2019  . Hypertension   . Retinal detachment    TRD OD   Past Surgical History:  Procedure Laterality Date  . CATARACT EXTRACTION    . EYE SURGERY    . RETINAL DETACHMENT SURGERY      FAMILY HISTORY Family History  Problem Relation Age of Onset  . Diabetes Maternal Grandmother   . Diabetes Sister     SOCIAL HISTORY Social History   Tobacco Use  . Smoking status: Never Smoker  . Smokeless  tobacco: Never Used  Vaping Use  . Vaping Use: Never used  Substance Use Topics  . Alcohol use: Yes    Comment: occ  . Drug use: No         OPHTHALMIC EXAM:  Not recorded     IMAGING AND PROCEDURES  Imaging and Procedures for _0 @           ASSESSMENT/PLAN:    ICD-10-CM   1. 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 Retinal edema  H35.81   3. 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 Essential hypertension  I10   5. Hypertensive retinopathy of both eyes  H35.033   6. Combined forms of age-related cataract of both eyes  H25.813     1,2. Proliferative diabetic retinopathy OU OD -- chronic, macula-involving TRD -- 20/400 vision -- severe retinal ischemia OS -- vitreous traction with macular edema / retinoschisis of superior macula -- BCVA 20/30-2 - former pt of Dr. Zadie Rhine in 2016 -- records reviewed, at that time, OD was already detached with VA 20/400, OS was to undergo PPV/MP/EL, but pt never had the surgery or followed up with [name] - FA (01.13.20) shows active NVE OS, OD with minimal leakage  - OCT shows chronic mac off TRD OD, and OS with tractional retinoschisis / macular edema of superior macula -- mild interval improvement today - VA 20/25 OS - S/P PRP fill-in OS (10.01.19), fill-in (01.13.20) - new onset vitreous hemorrhage following previous laser, continues to clear - discussed possible need for surgery in the future - F/U 2-3 week, OCT, DFE - OS may eventually need PPV / MP to relieve tractional retinoschisis, but with VA 20/25, will monitor for now  3. Chronic TRD OD-  - fovea involving chronic TRD OD -- periphery attached by laser PRP - severe ischemia / arteriolar sclerosis OD -- low vision potential - review of records for [name]  indicate mac off TRD has been present since early 2016 -- now 3+ years - discussed findings and poor prognosis - do not recommend surgical intervention OD -- high risk, low benefit - pt not interested in surgical intervention at this time - monitor  4,5. Hypertensive retinopathy OU - discussed importance of tight BP control - monitor  6. Combined form age related cataracts OU- - The symptoms of cataract, surgical options, and treatments and risks were discussed with patient. - discussed diagnosis and progression - not yet visually significant - monitor   Ophthalmic Meds Ordered this visit:  No orders of the defined types were placed in this  encounter.      No follow-ups on file.  There are no Patient Instructions on file for this visit.   Explained the diagnoses, plan, and follow up with the patient and they expressed understanding.  Patient expressed understanding of the importance of proper follow up care.   This document serves as a record of services personally performed by Gardiner Sleeper, MD, PhD. It was created on their behalf by Ernest Mallick, OA, an ophthalmic assistant. The creation of this record is the provider's dictation and/or activities during the visit.    Electronically signed by: Ernest Mallick, OA  02.10.2020 7:57 AM     Gardiner Sleeper, M.D., Ph.D. Diseases & Surgery of the Retina and Vitreous [facility]  I have reviewed the above documentation for accuracy and completeness, and I agree with the above. Gardiner Sleeper, M.D., Ph.D. 11/16/18 7:57 AM     Abbreviations: M myopia (nearsighted); A astigmatism; H hyperopia (farsighted); P presbyopia; Mrx spectacle prescription;  CTL contact lenses; OD right eye; OS left eye; OU both eyes  XT exotropia; ET esotropia; PEK punctate epithelial keratitis; PEE punctate epithelial erosions; DES dry eye syndrome; MGD meibomian gland dysfunction; ATs artificial tears; PFAT's preservative free  artificial tears; [place] nuclear sclerotic cataract; PSC posterior subcapsular cataract; ERM epi-retinal membrane; PVD posterior vitreous detachment; RD retinal detachment; DM diabetes mellitus; DR diabetic retinopathy; NPDR non-proliferative diabetic retinopathy; PDR proliferative diabetic retinopathy; CSME clinically significant macular edema; DME diabetic macular edema; dbh dot blot hemorrhages; CWS cotton wool spot; POAG primary open angle glaucoma; C/D cup-to-disc ratio; HVF humphrey visual field; GVF goldmann visual field; OCT optical coherence tomography; IOP intraocular pressure; BRVO Branch retinal vein occlusion; CRVO central retinal vein occlusion; CRAO central retinal artery occlusion; BRAO branch retinal artery occlusion; RT retinal tear; SB scleral buckle; PPV pars plana vitrectomy; VH Vitreous hemorrhage; PRP panretinal laser photocoagulation; IVK intravitreal kenalog; VMT vitreomacular traction; MH Macular hole;  NVD neovascularization of the disc; NVE neovascularization elsewhere; AREDS age related eye disease study; ARMD age related macular degeneration; POAG primary open angle glaucoma; EBMD epithelial/anterior basement membrane dystrophy; ACIOL anterior chamber intraocular lens; IOL intraocular lens; PCIOL posterior chamber intraocular lens; Phaco/IOL phacoemulsification with intraocular lens placement; [place] photorefractive keratectomy; LASIK laser assisted in situ keratomileusis; HTN hypertension; DM diabetes mellitus; COPD chronic obstructive pulmonary disease

## 2020-10-28 NOTE — Progress Notes (Deleted)
[facility] Note  10/28/2020     CHIEF COMPLAINT Patient presents for No chief complaint on file.   HISTORY OF PRESENT ILLNESS: Louis Peters is a 55 y.o. male who presents to the clinic today for:     Referring physician: Bernerd Limbo, MD [address] [place],  [place] [address]  HISTORICAL INFORMATION:   Selected notes from the MEDICAL RECORD NUMBER Referred by Dr. Herbert Deaner LEE: 06/26/2018 Ocular Hx-PDR OU, Tractional RD OU, cataracts OU BCVA OD LP, OS 20/50-    CURRENT MEDICATIONS: No current outpatient medications on file. (Ophthalmic Drugs)   No current facility-administered medications for this visit. (Ophthalmic Drugs)   Current Outpatient Medications (Other)  Medication Sig  . amLODipine (NORVASC) 10 MG tablet Take 10 mg by mouth daily.   Marland Kitchen ammonium lactate (LAC-HYDRIN) 12 % lotion Apply topically.  Marland Kitchen aspirin EC 81 MG EC tablet Take 1 tablet (81 mg total) by mouth daily. Swallow whole.  Marland Kitchen atorvastatin (LIPITOR) 80 MG tablet Take 1 tablet (80 mg total) by mouth daily.  Marland Kitchen BAQSIMI ONE PACK 3 MG/DOSE POWD Place 1 spray into both nostrils as directed. For low Glucose  . Blood Glucose Monitoring Suppl (ONETOUCH VERIO FLEX SYSTEM) w/Device KIT by Does not apply route.  . cholecalciferol (VITAMIN D3) 25 MCG (1000 UNIT) tablet Take 1,000 Units by mouth daily.  . Continuous Blood Gluc Receiver (FREESTYLE LIBRE 2 READER) DEVI by Does not apply route.  Marland Kitchen glucagon 1 MG injection Inject 1 mg into the skin once as needed (low Glucose).   Marland Kitchen HUMALOG KWIKPEN 100 UNIT/ML KiwkPen Inject 5-8 Units into the skin 3 (three) times daily. Take 5 units at breakfast and lunch and Take 8 units at dinner.  . hydrALAZINE (APRESOLINE) 50 MG tablet Take 50 mg by mouth 3 (three) times daily.  . hydrochlorothiazide (HYDRODIURIL) 25 MG tablet Take 25 mg by mouth daily.  . insulin degludec (TRESIBA) 100 UNIT/ML SOPN FlexTouch Pen Inject 14 Units into the  skin at bedtime.   . Insulin Disposable Pump (OMNIPOD DASH 5 PACK PODS) MISC REPLACE POD EVERY 72 HOURS  . Insulin Pen Needle 32G X 4 MM MISC Use with lantus and humalog 4 imes per day  . losartan (COZAAR) 100 MG tablet Take 100 mg by mouth daily.   . montelukast (SINGULAIR) 10 MG tablet Take 10 mg by mouth at bedtime.  . ONE TOUCH LANCETS MISC Use to check blood sugar 8 time(s) daily   No current facility-administered medications for this visit. (Other)      REVIEW OF SYSTEMS:    ALLERGIES Allergies  Allergen Reactions  . Fexofenadine Rash    unknown unknown unknown  . Latex Rash    PAST MEDICAL HISTORY Past Medical History:  Diagnosis Date  . Benign hypertension with chronic kidney disease, stage III ([place]) 03/10/2019  . Chronic kidney disease (CKD), stage III (moderate) ([place]) 03/10/2019  . Diabetes mellitus type 1, uncomplicated ([place]) 03/07/4402  . Diabetes mellitus without complication ([place])   . Diabetic retinopathy ([place])    PDR OU  . Heat Injury 03/10/2019  . Hypercholesterolemia 03/10/2019  . Hypertension   . Retinal detachment    TRD OD   Past Surgical History:  Procedure Laterality Date  . CATARACT EXTRACTION    . EYE SURGERY    . RETINAL DETACHMENT SURGERY      FAMILY HISTORY Family History  Problem Relation Age of Onset  . Diabetes Maternal Grandmother   .  Diabetes Sister     SOCIAL HISTORY Social History   Tobacco Use  . Smoking status: Never Smoker  . Smokeless tobacco: Never Used  Vaping Use  . Vaping Use: Never used  Substance Use Topics  . Alcohol use: Yes    Comment: occ  . Drug use: No         OPHTHALMIC EXAM: Not recorded     IMAGING AND PROCEDURES  Imaging and Procedures for 10/28/2020           ASSESSMENT/PLAN:    ICD-10-CM   1. 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 Retinal edema  H35.81   3. Right eye affected by proliferative  diabetic retinopathy with traction retinal detachment involving macula, associated with type 2 diabetes mellitus ([place])  D32.2025   4. Essential hypertension  I10   5. Hypertensive retinopathy of both eyes  H35.033   6. Combined forms of age-related cataract of both eyes  H25.813     1.  2.  3.  Ophthalmic Meds Ordered this visit:  No orders of the defined types were placed in this encounter.      No follow-ups on file.  There are no Patient Instructions on file for this visit.   Explained the diagnoses, plan, and follow up with the patient and they expressed understanding.  Patient expressed understanding of the importance of proper follow up care.   This document serves as a record of services personally performed by Gardiner Sleeper, MD, PhD. It was created on their behalf by Leonie Douglas, an ophthalmic technician. The creation of this record is the provider's dictation and/or activities during the visit.    Electronically signed by: Leonie Douglas COA, 10/28/20  8:01 AM   Gardiner Sleeper, M.D., Ph.D. Diseases & Surgery of the Retina and Vitreous [facility] 10/28/2020     Abbreviations: M myopia (nearsighted); A astigmatism; H hyperopia (farsighted); P presbyopia; Mrx spectacle prescription;  CTL contact lenses; OD right eye; OS left eye; OU both eyes  XT exotropia; ET esotropia; PEK punctate epithelial keratitis; PEE punctate epithelial erosions; DES dry eye syndrome; MGD meibomian gland dysfunction; ATs artificial tears; PFAT's preservative free artificial tears; [place] nuclear sclerotic cataract; PSC posterior subcapsular cataract; ERM epi-retinal membrane; PVD posterior vitreous detachment; RD retinal detachment; DM diabetes mellitus; DR diabetic retinopathy; NPDR non-proliferative diabetic retinopathy; PDR proliferative diabetic retinopathy; CSME clinically significant macular edema; DME diabetic macular edema; dbh dot blot hemorrhages; CWS cotton wool  spot; POAG primary open angle glaucoma; C/D cup-to-disc ratio; HVF humphrey visual field; GVF goldmann visual field; OCT optical coherence tomography; IOP intraocular pressure; BRVO Branch retinal vein occlusion; CRVO central retinal vein occlusion; CRAO central retinal artery occlusion; BRAO branch retinal artery occlusion; RT retinal tear; SB scleral buckle; PPV pars plana vitrectomy; VH Vitreous hemorrhage; PRP panretinal laser photocoagulation; IVK intravitreal kenalog; VMT vitreomacular traction; MH Macular hole;  NVD neovascularization of the disc; NVE neovascularization elsewhere; AREDS age related eye disease study; ARMD age related macular degeneration; POAG primary open angle glaucoma; EBMD epithelial/anterior basement membrane dystrophy; ACIOL anterior chamber intraocular lens; IOL intraocular lens; PCIOL posterior chamber intraocular lens; Phaco/IOL phacoemulsification with intraocular lens placement; [place] photorefractive keratectomy; LASIK laser assisted in situ keratomileusis; HTN hypertension; DM diabetes mellitus; COPD chronic obstructive pulmonary disease

## 2020-11-01 ENCOUNTER — Ambulatory Visit: Payer: Medicare Other

## 2020-11-03 ENCOUNTER — Encounter (HOSPITAL_COMMUNITY): Payer: Self-pay | Admitting: Vascular Surgery

## 2020-11-03 ENCOUNTER — Ambulatory Visit: Payer: Medicare Other

## 2020-11-03 NOTE — Progress Notes (Signed)
EKG:  07/22/20 CXR:  04/09/17 ECHO:  07/22/20 Stress Test:  Denies Cardiac Cath:  Denies  Fasting Blood Sugar- Checks Blood Sugar___ times a day  OSA/CPAP:  No  ASA:  Continue Blood Thinners:  No  Covid test 11/06/20  Anesthesia Review:  NO  Patient denies shortness of breath, fever, cough, and chest pain at PAT appointment.  Patient verbalized understanding of instructions provided today at the PAT appointment.  Patient asked to review instructions at home and day of surgery.

## 2020-11-06 ENCOUNTER — Other Ambulatory Visit (HOSPITAL_COMMUNITY)
Admission: RE | Admit: 2020-11-06 | Discharge: 2020-11-06 | Disposition: A | Discharge status: Home / Self Care | Payer: Medicare Other | Source: Ambulatory Visit | Attending: Vascular Surgery | Admitting: Vascular Surgery

## 2020-11-06 DIAGNOSIS — Z20822 Contact with and (suspected) exposure to covid-19: Secondary | ICD-10-CM | POA: Insufficient documentation

## 2020-11-06 DIAGNOSIS — Z01812 Encounter for preprocedural laboratory examination: Secondary | ICD-10-CM | POA: Diagnosis present

## 2020-11-06 LAB — SARS CORONAVIRUS 2 (TAT 6-24 HRS): SARS Coronavirus 2: NEGATIVE

## 2020-11-07 NOTE — Anesthesia Preprocedure Evaluation (Signed)
Anesthesia Evaluation  Patient identified by MRN, date of birth, ID band Patient awake    Reviewed: Allergy & Precautions, NPO status , Patient's Chart, lab work & pertinent test results  History of Anesthesia Complications Negative for: history of anesthetic complications  Airway Mallampati: I  TM Distance: >3 FB Neck ROM: Full    Dental  (+) Edentulous Upper, Edentulous Lower   Pulmonary neg pulmonary ROS,  11/06/2020 SARS coronavirus NEG   breath sounds clear to auscultation       Cardiovascular hypertension, Pt. on medications (-) angina Rhythm:Regular Rate:Normal  07/2020 ECHO: EF 55-60%, grade 1 DD, mild MR   Neuro/Psych CVA, No Residual Symptoms negative psych ROS   GI/Hepatic negative GI ROS, Neg liver ROS,   Endo/Other  diabetes (glu 153), Insulin Dependent  Renal/GU ESRFRenal disease (not on dialysis yet, K+ 4.5)     Musculoskeletal   Abdominal   Peds  Hematology negative hematology ROS (+)   Anesthesia Other Findings   Reproductive/Obstetrics                            Anesthesia Physical Anesthesia Plan  ASA: III  Anesthesia Plan: MAC   Post-op Pain Management:    Induction:   PONV Risk Score and Plan: 1 and Ondansetron and Treatment may vary due to age or medical condition  Airway Management Planned: Natural Airway and Simple Face Mask  Additional Equipment: None  Intra-op Plan:   Post-operative Plan:   Informed Consent: I have reviewed the patients History and Physical, chart, labs and discussed the procedure including the risks, benefits and alternatives for the proposed anesthesia with the patient or authorized representative who has indicated his/her understanding and acceptance.       Plan Discussed with: CRNA and Surgeon  Anesthesia Plan Comments:        Anesthesia Quick Evaluation

## 2020-11-08 ENCOUNTER — Other Ambulatory Visit: Payer: Self-pay

## 2020-11-08 ENCOUNTER — Ambulatory Visit (HOSPITAL_COMMUNITY): Payer: Medicare Other | Admitting: Anesthesiology

## 2020-11-08 ENCOUNTER — Encounter (HOSPITAL_COMMUNITY): Payer: Self-pay | Admitting: Vascular Surgery

## 2020-11-08 ENCOUNTER — Ambulatory Visit: Payer: Medicare Other

## 2020-11-08 ENCOUNTER — Ambulatory Visit (HOSPITAL_COMMUNITY)
Admission: RE | Admit: 2020-11-08 | Discharge: 2020-11-08 | Disposition: A | Discharge status: Home / Self Care | Payer: Medicare Other | Attending: Vascular Surgery | Admitting: Vascular Surgery

## 2020-11-08 ENCOUNTER — Encounter (HOSPITAL_COMMUNITY)
Admission: RE | Disposition: A | Discharge status: Home / Self Care | Payer: Self-pay | Source: Home / Self Care | Attending: Vascular Surgery

## 2020-11-08 DIAGNOSIS — Z888 Allergy status to other drugs, medicaments and biological substances status: Secondary | ICD-10-CM | POA: Diagnosis not present

## 2020-11-08 DIAGNOSIS — Z794 Long term (current) use of insulin: Secondary | ICD-10-CM | POA: Diagnosis not present

## 2020-11-08 DIAGNOSIS — E785 Hyperlipidemia, unspecified: Secondary | ICD-10-CM | POA: Diagnosis not present

## 2020-11-08 DIAGNOSIS — I120 Hypertensive chronic kidney disease with stage 5 chronic kidney disease or end stage renal disease: Secondary | ICD-10-CM | POA: Diagnosis not present

## 2020-11-08 DIAGNOSIS — Q613 Polycystic kidney, unspecified: Secondary | ICD-10-CM | POA: Insufficient documentation

## 2020-11-08 DIAGNOSIS — E1022 Type 1 diabetes mellitus with diabetic chronic kidney disease: Secondary | ICD-10-CM | POA: Insufficient documentation

## 2020-11-08 DIAGNOSIS — E10319 Type 1 diabetes mellitus with unspecified diabetic retinopathy without macular edema: Secondary | ICD-10-CM | POA: Diagnosis not present

## 2020-11-08 DIAGNOSIS — N186 End stage renal disease: Secondary | ICD-10-CM | POA: Insufficient documentation

## 2020-11-08 DIAGNOSIS — Z833 Family history of diabetes mellitus: Secondary | ICD-10-CM | POA: Insufficient documentation

## 2020-11-08 DIAGNOSIS — Z79899 Other long term (current) drug therapy: Secondary | ICD-10-CM | POA: Diagnosis not present

## 2020-11-08 DIAGNOSIS — Z7982 Long term (current) use of aspirin: Secondary | ICD-10-CM | POA: Diagnosis not present

## 2020-11-08 DIAGNOSIS — Z91040 Latex allergy status: Secondary | ICD-10-CM | POA: Diagnosis not present

## 2020-11-08 DIAGNOSIS — N184 Chronic kidney disease, stage 4 (severe): Secondary | ICD-10-CM | POA: Diagnosis not present

## 2020-11-08 HISTORY — PX: AV FISTULA PLACEMENT: SHX1204

## 2020-11-08 LAB — POCT I-STAT, CHEM 8
BUN: 69 mg/dL — ABNORMAL HIGH (ref 6–20)
Calcium, Ion: 1.19 mmol/L (ref 1.15–1.40)
Chloride: 104 mmol/L (ref 98–111)
Creatinine, Ser: 5.6 mg/dL — ABNORMAL HIGH (ref 0.61–1.24)
Glucose, Bld: 166 mg/dL — ABNORMAL HIGH (ref 70–99)
HCT: 40 % (ref 39.0–52.0)
Hemoglobin: 13.6 g/dL (ref 13.0–17.0)
Potassium: 4.5 mmol/L (ref 3.5–5.1)
Sodium: 139 mmol/L (ref 135–145)
TCO2: 24 mmol/L (ref 22–32)

## 2020-11-08 LAB — GLUCOSE, CAPILLARY
Glucose-Capillary: 153 mg/dL — ABNORMAL HIGH (ref 70–99)
Glucose-Capillary: 204 mg/dL — ABNORMAL HIGH (ref 70–99)

## 2020-11-08 SURGERY — ARTERIOVENOUS (AV) FISTULA CREATION
Anesthesia: Monitor Anesthesia Care | Laterality: Right

## 2020-11-08 MED ORDER — PROTAMINE SULFATE 10 MG/ML IV SOLN
INTRAVENOUS | Status: DC | PRN
  Administered 2020-11-08: 40 mg via INTRAVENOUS

## 2020-11-08 MED ORDER — SODIUM CHLORIDE 0.9 % IV SOLN
INTRAVENOUS | Status: DC | PRN
  Administered 2020-11-08: 08:00:00 500 mL

## 2020-11-08 MED ORDER — MIDAZOLAM HCL 2 MG/2ML IJ SOLN: 0.5000 mg | Freq: Once | INTRAMUSCULAR | Status: DC | PRN

## 2020-11-08 MED ORDER — SODIUM CHLORIDE 0.9 % IV SOLN
INTRAVENOUS | Status: AC
  Filled 2020-11-08: qty 1.2

## 2020-11-08 MED ORDER — FENTANYL CITRATE (PF) 250 MCG/5ML IJ SOLN
INTRAMUSCULAR | Status: AC
  Filled 2020-11-08: qty 5

## 2020-11-08 MED ORDER — CHLORHEXIDINE GLUCONATE 4 % EX LIQD: 60.0000 mL | Freq: Once | CUTANEOUS | Status: DC

## 2020-11-08 MED ORDER — HEPARIN SODIUM (PORCINE) 1000 UNIT/ML IJ SOLN
INTRAMUSCULAR | Status: DC | PRN
Start: 2020-11-08 — End: 2020-11-08
  Administered 2020-11-08: 8000 [IU] via INTRAVENOUS

## 2020-11-08 MED ORDER — ORAL CARE MOUTH RINSE: 15.0000 mL | Freq: Once | OROMUCOSAL | Status: AC

## 2020-11-08 MED ORDER — FENTANYL CITRATE (PF) 250 MCG/5ML IJ SOLN
INTRAMUSCULAR | Status: DC | PRN
  Administered 2020-11-08: 50 ug via INTRAVENOUS
  Administered 2020-11-08: 25 ug via INTRAVENOUS

## 2020-11-08 MED ORDER — DEXAMETHASONE SODIUM PHOSPHATE 10 MG/ML IJ SOLN
INTRAMUSCULAR | Status: DC | PRN
  Administered 2020-11-08: 5 mg via INTRAVENOUS

## 2020-11-08 MED ORDER — LIDOCAINE-EPINEPHRINE (PF) 1 %-1:200000 IJ SOLN
INTRAMUSCULAR | Status: DC | PRN
  Administered 2020-11-08: 20 mL

## 2020-11-08 MED ORDER — OXYCODONE-ACETAMINOPHEN 5-325 MG PO TABS: 1.0000 | ORAL_TABLET | ORAL | 0 refills | Status: DC | PRN

## 2020-11-08 MED ORDER — PROTAMINE SULFATE 10 MG/ML IV SOLN
INTRAVENOUS | Status: AC
  Filled 2020-11-08: qty 25

## 2020-11-08 MED ORDER — PHENYLEPHRINE HCL-NACL 10-0.9 MG/250ML-% IV SOLN
INTRAVENOUS | Status: DC | PRN
  Administered 2020-11-08: 30 ug/min via INTRAVENOUS

## 2020-11-08 MED ORDER — SODIUM CHLORIDE 0.9 % IV SOLN: INTRAVENOUS | Status: DC

## 2020-11-08 MED ORDER — DEXAMETHASONE SODIUM PHOSPHATE 10 MG/ML IJ SOLN
INTRAMUSCULAR | Status: AC
  Filled 2020-11-08: qty 1

## 2020-11-08 MED ORDER — MEPERIDINE HCL 25 MG/ML IJ SOLN: 6.2500 mg | INTRAMUSCULAR | Status: DC | PRN

## 2020-11-08 MED ORDER — LACTATED RINGERS IV SOLN: INTRAVENOUS | Status: DC

## 2020-11-08 MED ORDER — PROPOFOL 10 MG/ML IV BOLUS
INTRAVENOUS | Status: AC
  Filled 2020-11-08: qty 20

## 2020-11-08 MED ORDER — PROMETHAZINE HCL 25 MG/ML IJ SOLN: 6.2500 mg | INTRAMUSCULAR | Status: DC | PRN

## 2020-11-08 MED ORDER — OXYCODONE HCL 5 MG PO TABS: 5.0000 mg | ORAL_TABLET | Freq: Once | ORAL | Status: DC | PRN

## 2020-11-08 MED ORDER — HEPARIN SODIUM (PORCINE) 1000 UNIT/ML IJ SOLN
INTRAMUSCULAR | Status: AC
  Filled 2020-11-08: qty 1

## 2020-11-08 MED ORDER — OXYCODONE HCL 5 MG/5ML PO SOLN: 5.0000 mg | Freq: Once | ORAL | Status: DC | PRN

## 2020-11-08 MED ORDER — LIDOCAINE HCL (PF) 1 % IJ SOLN
INTRAMUSCULAR | Status: AC
  Filled 2020-11-08: qty 30

## 2020-11-08 MED ORDER — CHLORHEXIDINE GLUCONATE 0.12 % MT SOLN
15.0000 mL | Freq: Once | OROMUCOSAL | Status: AC
  Administered 2020-11-08: 15 mL via OROMUCOSAL
  Filled 2020-11-08: qty 15

## 2020-11-08 MED ORDER — 0.9 % SODIUM CHLORIDE (POUR BTL) OPTIME
TOPICAL | Status: DC | PRN
  Administered 2020-11-08: 1000 mL

## 2020-11-08 MED ORDER — ONDANSETRON HCL 4 MG/2ML IJ SOLN
INTRAMUSCULAR | Status: AC
  Filled 2020-11-08: qty 2

## 2020-11-08 MED ORDER — LACTATED RINGERS IV SOLN: INTRAVENOUS | Status: DC | PRN

## 2020-11-08 MED ORDER — PROPOFOL 500 MG/50ML IV EMUL
INTRAVENOUS | Status: DC | PRN
  Administered 2020-11-08: 75 ug/kg/min via INTRAVENOUS

## 2020-11-08 MED ORDER — ONDANSETRON HCL 4 MG/2ML IJ SOLN
INTRAMUSCULAR | Status: DC | PRN
  Administered 2020-11-08: 4 mg via INTRAVENOUS

## 2020-11-08 MED ORDER — FENTANYL CITRATE (PF) 100 MCG/2ML IJ SOLN: 25.0000 ug | INTRAMUSCULAR | Status: DC | PRN

## 2020-11-08 MED ORDER — CEFAZOLIN SODIUM-DEXTROSE 2-4 GM/100ML-% IV SOLN
2.0000 g | INTRAVENOUS | Status: AC
  Administered 2020-11-08: 2 g via INTRAVENOUS
  Filled 2020-11-08: qty 100

## 2020-11-08 MED ORDER — LIDOCAINE-EPINEPHRINE (PF) 1 %-1:200000 IJ SOLN
INTRAMUSCULAR | Status: AC
  Filled 2020-11-08: qty 30

## 2020-11-08 SURGICAL SUPPLY — 36 items
ADH SKN CLS APL DERMABOND .7 (GAUZE/BANDAGES/DRESSINGS) ×1
ADH SKN CLS LQ APL DERMABOND (GAUZE/BANDAGES/DRESSINGS) ×1
ARMBAND PINK RESTRICT EXTREMIT (MISCELLANEOUS) ×4 IMPLANT
CANISTER SUCT 3000ML PPV (MISCELLANEOUS) ×2 IMPLANT
CANNULA VESSEL 3MM 2 BLNT TIP (CANNULA) ×2 IMPLANT
CLIP VESOCCLUDE MED 6/CT (CLIP) ×2 IMPLANT
CLIP VESOCCLUDE SM WIDE 6/CT (CLIP) ×2 IMPLANT
COVER PROBE W GEL 5X96 (DRAPES) IMPLANT
COVER WAND RF STERILE (DRAPES) ×2 IMPLANT
DECANTER SPIKE VIAL GLASS SM (MISCELLANEOUS) ×1 IMPLANT
DERMABOND ADHESIVE PROPEN (GAUZE/BANDAGES/DRESSINGS) ×1
DERMABOND ADVANCED (GAUZE/BANDAGES/DRESSINGS) ×1
DERMABOND ADVANCED .7 DNX12 (GAUZE/BANDAGES/DRESSINGS) ×1 IMPLANT
DERMABOND ADVANCED .7 DNX6 (GAUZE/BANDAGES/DRESSINGS) IMPLANT
ELECT REM PT RETURN 9FT ADLT (ELECTROSURGICAL) ×2
ELECTRODE REM PT RTRN 9FT ADLT (ELECTROSURGICAL) ×1 IMPLANT
GLOVE BIO SURGEON STRL SZ7.5 (GLOVE) ×2 IMPLANT
GLOVE SRG 8 PF TXTR STRL LF DI (GLOVE) ×1 IMPLANT
GLOVE SURG UNDER POLY LF SZ6.5 (GLOVE) ×4 IMPLANT
GLOVE SURG UNDER POLY LF SZ7 (GLOVE) ×1 IMPLANT
GLOVE SURG UNDER POLY LF SZ8 (GLOVE) ×2
GOWN STRL REUS W/ TWL LRG LVL3 (GOWN DISPOSABLE) ×3 IMPLANT
GOWN STRL REUS W/TWL LRG LVL3 (GOWN DISPOSABLE) ×6
KIT BASIN OR (CUSTOM PROCEDURE TRAY) ×2 IMPLANT
KIT TURNOVER KIT B (KITS) ×2 IMPLANT
NS IRRIG 1000ML POUR BTL (IV SOLUTION) ×2 IMPLANT
PACK CV ACCESS (CUSTOM PROCEDURE TRAY) ×2 IMPLANT
PAD ARMBOARD 7.5X6 YLW CONV (MISCELLANEOUS) ×4 IMPLANT
SPONGE SURGIFOAM ABS GEL 100 (HEMOSTASIS) IMPLANT
SUT PROLENE 6 0 BV (SUTURE) ×3 IMPLANT
SUT VIC AB 3-0 SH 27 (SUTURE) ×2
SUT VIC AB 3-0 SH 27X BRD (SUTURE) ×1 IMPLANT
SUT VICRYL 4-0 PS2 18IN ABS (SUTURE) ×2 IMPLANT
TOWEL GREEN STERILE (TOWEL DISPOSABLE) ×2 IMPLANT
UNDERPAD 30X36 HEAVY ABSORB (UNDERPADS AND DIAPERS) ×2 IMPLANT
WATER STERILE IRR 1000ML POUR (IV SOLUTION) ×2 IMPLANT

## 2020-11-08 NOTE — Discharge Instructions (Signed)
[facility]  Discharge Instructions  AV Fistula or Graft Surgery for Dialysis Access  Please refer to the following instructions for your post-procedure care. Your surgeon or physician assistant will discuss any changes with you.  Activity  You may drive the day following your surgery, if you are comfortable and no longer taking prescription pain medication. Resume full activity as the soreness in your incision resolves.  Bathing/Showering  You may shower after you go home. Keep your incision dry for 48 hours. Do not soak in a bathtub, hot tub, or swim until the incision heals completely. You may not shower if you have a hemodialysis catheter.  Incision Care  Clean your incision with mild soap and water after 48 hours. Pat the area dry with a clean towel. You do not need a bandage unless otherwise instructed. Do not apply any ointments or creams to your incision. You may have skin glue on your incision. Do not peel it off. It will come off on its own in about one week. Your arm may swell a bit after surgery. To reduce swelling use pillows to elevate your arm so it is above your heart. Your doctor will tell you if you need to lightly wrap your arm with an ACE bandage.  Diet  Resume your normal diet. There are not special food restrictions following this procedure. In order to heal from your surgery, it is CRITICAL to get adequate nutrition. Your body requires vitamins, minerals, and protein. Vegetables are the best source of vitamins and minerals. Vegetables also provide the perfect balance of protein. Processed food has little nutritional value, so try to avoid this.  Medications  Resume taking all of your medications. If your incision is causing pain, you may take over-the counter pain relievers such as acetaminophen (Tylenol). If you were prescribed a stronger pain medication, please be aware these medications can cause nausea and constipation. Prevent  nausea by taking the medication with a snack or meal. Avoid constipation by drinking plenty of fluids and eating foods with high amount of fiber, such as fruits, vegetables, and grains.  Do not take Tylenol if you are taking prescription pain medications.  Follow up Your surgeon may want to see you in the office following your access surgery. If so, this will be arranged at the time of your surgery.  Please call us immediately for any of the following conditions:   Increased pain, redness, drainage (pus) from your incision site  Fever of 101 degrees or higher  Severe or worsening pain at your incision site  Hand pain or numbness.   Reduce your risk of vascular disease:   Stop smoking. If you would like help, call [facility] at 1-800-QUIT-NOW (320) 317-9084) or [place] at [employer] your cholesterol  Maintain a desired weight  Control your diabetes  Keep your blood pressure down  Dialysis  It will take several weeks to several months for your new dialysis access to be ready for use. Your surgeon will determine when it is okay to use it. Your nephrologist will continue to direct your dialysis. You can continue to use your Permcath until your new access is ready for use.   11/08/2020 Louis Peters [id number] Aug 09, 1966  Surgeon(s): Angelia Mould, MD  Procedure(s): RIGHT ARM BRACHIOCEPHALIC ARTERIOVENOUS (AV) FISTULA CREATION   May stick graft immediately   May stick graft on designated area only:   X Do not stick right AV fistula for 12 weeks  If you have any questions, please call the office at [phone].

## 2020-11-08 NOTE — Interval H&P Note (Signed)
History and Physical Interval Note:  11/08/2020 7:20 AM  Louis Peters  has presented today for surgery, with the diagnosis of ESRD.  The various methods of treatment have been discussed with the patient and family. After consideration of risks, benefits and other options for treatment, the patient has consented to  Procedure(s): RIGHT ARM BRACHIOCEPHALIC ARTERIOVENOUS (AV) FISTULA CREATION (Right) as a surgical intervention.  The patient's history has been reviewed, patient examined, no change in status, stable for surgery.  I have reviewed the patient's chart and labs.  Questions were answered to the patient's satisfaction.     Deitra Mayo

## 2020-11-08 NOTE — Anesthesia Postprocedure Evaluation (Signed)
Anesthesia Post Note  Patient: Louis Peters  Procedure(s) Performed: RIGHT ARM BRACHIOCEPHALIC ARTERIOVENOUS (AV) FISTULA CREATION (Right )     Patient location during evaluation: PACU Anesthesia Type: MAC Level of consciousness: awake and alert, patient cooperative and oriented Pain management: pain level controlled Vital Signs Assessment: post-procedure vital signs reviewed and stable Respiratory status: spontaneous breathing, nonlabored ventilation and respiratory function stable Cardiovascular status: blood pressure returned to baseline and stable Postop Assessment: no apparent nausea or vomiting Anesthetic complications: no   No complications documented.  Last Vitals:  Vitals:   11/08/20 0922 11/08/20 0940  BP: (!) 151/77 (!) 155/74  Pulse: 82 83  Resp: 15 15  Temp:    SpO2: 98% 99%    Last Pain:  Vitals:   11/08/20 0940  TempSrc:   PainSc: 0-No pain                 Gearl Kimbrough,E. Lorrene Graef

## 2020-11-08 NOTE — Op Note (Signed)
    NAME: Louis Peters    MRN: [id number] DOB: [birth year]    DATE OF OPERATION: 11/08/2020  PREOP DIAGNOSIS:    Stage IV chronic kidney disease  POSTOP DIAGNOSIS:    Same  PROCEDURE:    Right brachiocephalic AV fistula  SURGEON: Judeth Cornfield. Scot Dock, MD  ASSIST: Karoline Caldwell, PA  ANESTHESIA: Local with sedation  EBL: Minimal  INDICATIONS:    [name] is a 55 y.o. male who is not yet on dialysis.  He presents for new access.  FINDINGS:   4 mm upper arm cephalic vein.  Palpable radial pulse at the completion of the procedure with a good thrill in fistula.  TECHNIQUE:   The patient was taken to the operating room and sedated by anesthesia.  The right upper extremity was prepped and draped in usual sterile fashion.  After the skin was anesthetized with 1% lidocaine a transverse incision was made just below the antecubital level where the cephalic vein was dissected free and ligated distally.  Irrigated up nicely with heparinized saline.  The brachial artery was dissected free beneath the fascia.  The patient was heparinized.  The brachial artery was clamped proximally and distally and a longitudinal arteriotomy was made.  The vein was sewn into side to the artery using continuous 6-0 Prolene suture.  At the completion was a good thrill in the fistula.  There was a palpable radial pulse.  Hemostasis was obtained in the wound.  The wound was then closed with a deep layer of 3-0 Vicryl and the skin closed with 4-0 Vicryl.  Dermabond was applied.  The patient tolerated the procedure well was transferred to recovery room in stable condition.  All needle and sponge counts were correct.  Given the complexity of the case a first assistant was necessary in order to expedient the procedure and safely perform the technical aspects of the operation.  Deitra Mayo, MD, FACS [facility]  DATE OF DICTATION:   11/08/2020

## 2020-11-08 NOTE — Transfer of Care (Signed)
Immediate Anesthesia Transfer of Care Note  Patient: Louis Peters  Procedure(s) Performed: RIGHT ARM BRACHIOCEPHALIC ARTERIOVENOUS (AV) FISTULA CREATION (Right )  Patient Location: PACU  Anesthesia Type:MAC  Level of Consciousness: drowsy and patient cooperative  Airway & Oxygen Therapy: Patient Spontanous Breathing  Post-op Assessment: Report given to RN and Post -op Vital signs reviewed and stable  Post vital signs: Reviewed and stable  Last Vitals:  Vitals Value Taken Time  BP 129/65 11/08/20 0907  Temp    Pulse 83 11/08/20 0907  Resp 17 11/08/20 0907  SpO2 99 % 11/08/20 0907  Vitals shown include unvalidated device data.  Last Pain:  Vitals:   11/08/20 0622  TempSrc:   PainSc: 0-No pain         Complications: No complications documented.

## 2020-11-08 NOTE — Anesthesia Procedure Notes (Signed)
Procedure Name: MAC Date/Time: 11/08/2020 7:37 AM Performed by: Kathryne Hitch, CRNA Pre-anesthesia Checklist: Patient identified, Emergency Drugs available, Suction available and Patient being monitored Patient Re-evaluated:Patient Re-evaluated prior to induction Oxygen Delivery Method: Simple face mask Preoxygenation: Pre-oxygenation with 100% oxygen Induction Type: IV induction Placement Confirmation: positive ETCO2 Dental Injury: Teeth and Oropharynx as per pre-operative assessment

## 2020-11-09 ENCOUNTER — Encounter (HOSPITAL_COMMUNITY)
Admission: RE | Admit: 2020-11-09 | Discharge: 2020-11-09 | Disposition: A | Discharge status: Home / Self Care | Payer: Medicare Other | Source: Ambulatory Visit | Attending: Nephrology | Admitting: Nephrology

## 2020-11-09 ENCOUNTER — Encounter (HOSPITAL_COMMUNITY): Payer: Self-pay | Admitting: Vascular Surgery

## 2020-11-09 VITALS — BP 129/66 | HR 82 | Temp 98.3°F | Resp 18

## 2020-11-09 DIAGNOSIS — D631 Anemia in chronic kidney disease: Secondary | ICD-10-CM | POA: Insufficient documentation

## 2020-11-09 DIAGNOSIS — N184 Chronic kidney disease, stage 4 (severe): Secondary | ICD-10-CM | POA: Insufficient documentation

## 2020-11-09 DIAGNOSIS — N1832 Chronic kidney disease, stage 3b: Secondary | ICD-10-CM

## 2020-11-09 LAB — IRON AND TIBC
Iron: 70 ug/dL (ref 45–182)
Saturation Ratios: 29 % (ref 17.9–39.5)
TIBC: 238 ug/dL — ABNORMAL LOW (ref 250–450)
UIBC: 168 ug/dL

## 2020-11-09 LAB — FERRITIN: Ferritin: 189 ng/mL (ref 24–336)

## 2020-11-09 LAB — POCT HEMOGLOBIN-HEMACUE: Hemoglobin: 11.6 g/dL — ABNORMAL LOW (ref 13.0–17.0)

## 2020-11-09 MED ORDER — EPOETIN ALFA-EPBX 10000 UNIT/ML IJ SOLN
INTRAMUSCULAR | Status: AC
  Filled 2020-11-09: qty 1

## 2020-11-09 MED ORDER — EPOETIN ALFA-EPBX 10000 UNIT/ML IJ SOLN
10000.0000 [IU] | INTRAMUSCULAR | Status: DC
  Administered 2020-11-09: 10000 [IU] via SUBCUTANEOUS

## 2020-11-10 ENCOUNTER — Other Ambulatory Visit: Payer: Self-pay

## 2020-11-10 ENCOUNTER — Ambulatory Visit: Payer: Medicare Other | Attending: Adult Health

## 2020-11-10 DIAGNOSIS — R471 Dysarthria and anarthria: Secondary | ICD-10-CM | POA: Insufficient documentation

## 2020-11-10 NOTE — Therapy (Signed)
[facility] [address] [place], [place], [address] Phone: [phone]   Fax:  [phone]  Speech Language Pathology Treatment/Discharge Summary  Patient Details  Name: Louis Peters MRN: [id number] Date of Birth: 11-May-1966 Referring Provider (SLP): Frann Rider, NP   Encounter Date: 11/10/2020   End of Session - 11/10/20 1140    Visit Number 5    Number of Visits 17    Date for SLP Re-Evaluation 12/16/20   extended two weeks out from 90 day date as pt's first appointment 4 weeks after eval date   SLP Start Time 0935    SLP Stop Time  40    SLP Time Calculation (min) 30 min    Activity Tolerance Patient tolerated treatment well           Past Medical History:  Diagnosis Date  . Benign hypertension with chronic kidney disease, stage III ([place]) 03/10/2019  . Chronic kidney disease (CKD), stage III (moderate) ([place]) 03/10/2019  . Diabetes mellitus type 1, uncomplicated ([place]) 02/03/6567  . Diabetes mellitus without complication ([place])   . Diabetic retinopathy ([place])    PDR OU  . Heat Injury 03/10/2019  . Hypercholesterolemia 03/10/2019  . Hypertension   . Retinal detachment    TRD OD    Past Surgical History:  Procedure Laterality Date  . AV FISTULA PLACEMENT Right 11/08/2020   Procedure: RIGHT ARM BRACHIOCEPHALIC ARTERIOVENOUS (AV) FISTULA CREATION;  Surgeon: Angelia Mould, MD;  Location: [facility];  Service: Vascular;  Laterality: Right;  . CATARACT EXTRACTION    . EYE SURGERY    . RETINAL DETACHMENT SURGERY      SPEECH THERAPY DISCHARGE SUMMARY  Visits from Start of Care: 5  Current functional level related to goals / functional outcomes: Pt met most STGs, and his one LTG met 2/3 dates for speech intelligibility - would have met a third but pt/SLP agreed d/c was warranted at this time.   Remaining deficits: None   Education / Equipment: Compensations for speech clarity, HEP for dysarthria    Plan: Patient agrees to discharge.  Patient goals were partially met. Patient is being discharged due to meeting the stated rehab goals.  ?????        There were no vitals filed for this visit.   Subjective Assessment - 11/10/20 1022    Subjective "I talk too much. But she still loves me!"    Currently in Pain? No/denies                 ADULT SLP TREATMENT - 11/10/20 1023      General Information   Behavior/Cognition Alert;Cooperative;Pleasant mood      Treatment Provided   Treatment provided Cognitive-Linquistic      Cognitive-Linquistic Treatment   Treatment focused on Dysarthria    Skilled Treatment Louis Peters tells SLP his speech has again improved since last visit. Pt again maintains he has kept to suggested frequency with the HEP. Today, pt completed HEP with independence. Outside for 12 minutes pt maintained excellent intelligibilty in complex conversation. Louis Peters is comfortable with d/c today and agrees it is appropriate timing for this.      Assessment / Recommendations / Plan   Plan Discharge SLP treatment due to (comment)   pt met goals/comfortable with d/c     Progression Toward Goals   Progression toward goals --   d/c day- see update of goals           SLP Education - 11/10/20  1140    Education Details how long to continue the exercises    Person(s) Educated Patient    Methods Explanation    Comprehension Verbalized understanding            SLP Short Term Goals - 11/10/20 1433      SLP SHORT TERM GOAL #1   Title pt will copmlete dysarthria HEP (for speech compensations) with rare min A over 3 sessions    Time 3    Status Achieved      SLP SHORT TERM GOAL #2   Title pt will demonstrtate speech compensations in 10 minutes min-mod complex conversation 80% of the time in 3 sessions    Baseline 10-20-20, 10-25-20    Time 2    Period Weeks    Status Achieved      SLP SHORT TERM GOAL #3   Title pt will undergo objective swallowing assessment  PRN    Status Deferred      SLP SHORT TERM GOAL #4   Title pt will complete dysarthria (strength of articulators) HEP with rare min A over two sessions    Status Achieved            SLP Long Term Goals - 11/10/20 1434      SLP LONG TERM GOAL #1   Title pt will use speech intelligibilty strategies in 20 minutes mod complex conversation x3 sessions    Baseline 10-25-20, 11-10-20    Period --   or 17 total sessions   Status Partially Met            Plan - 11/10/20 1141    Clinical Impression Statement Pt continues to present today with 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 Pt reports no overt s/sx dysphagia. Again today, Louis Peters did very well with speech compensations outdoors wiht complex conversation. He agreed d/c is appropriate at this time.    Treatment/Interventions Aspiration precaution training;Pharyngeal strengthening exercises;Diet toleration management by SLP;Trials of upgraded texture/liquids;Cueing hierarchy;Patient/family education;Compensatory strategies;SLP instruction and feedback;Oral motor exercises;Functional tasks;Other (comment)    Potential to Achieve Goals Good    SLP Home Exercise Plan provided    Consulted and Agree with Plan of Care Patient           Patient will benefit from skilled therapeutic intervention in order to improve the following deficits and impairments:   Dysarthria and anarthria    Problem List Patient Active Problem List   Diagnosis Date Noted  . Slurred speech 07/26/2020  . Hypertension   . Hypertensive urgency   . Ischemic stroke ([place])   . History of amputation of lesser toe ([place]) 07/30/2019  . AKI (acute kidney injury) ([place]) 03/10/2019  . Heat Injury 03/10/2019  . Chronic kidney disease (CKD), stage III (moderate) ([place]) 03/10/2019  . Benign hypertension with chronic kidney disease, stage III ([place]) 03/10/2019  . Hypercholesterolemia 03/10/2019  . Acute kidney injury superimposed on chronic kidney disease ([place])   .  Diabetic infection of left foot ([place]) 08/10/2018  . Hyperlipidemia due to type 1 diabetes mellitus ([place]) 07/31/2018  . Combined forms of age-related cataract of both eyes 01/25/2017  . Proliferative diabetic retinopathy of left eye without macular edema associated with type 1 diabetes mellitus ([place]) 01/25/2017  . Vitreous hemorrhage of left eye ([place]) 01/25/2017  . Fungal dermatitis 02/18/2016  . Diabetes mellitus type 1, uncomplicated ([place]) 92/08/9416  . Edema 11/27/2012  . Proteinuria 09/30/2012  . Diabetic peripheral neuropathy ([place]) 05/02/2012  . Proliferative diabetic retinopathy ([place]) 03/24/2012  [facility] ,[place], [place]  11/10/2020, 2:34 PM  [facility] [address] [place], [place], [address] Phone: [phone]   Fax:  [phone]   Name: KALIB BHAGAT MRN: [id number] Date of Birth: 06-24-1966

## 2020-11-10 NOTE — Patient Instructions (Signed)
Continue to do exercises 6/7 days/week until Feb 28th, then 3x/week for the first two weeks in March. Then you can stop the exercises.

## 2020-11-22 ENCOUNTER — Ambulatory Visit: Payer: Medicare Other

## 2020-11-23 ENCOUNTER — Inpatient Hospital Stay (HOSPITAL_COMMUNITY): Admission: RE | Admit: 2020-11-23 | Payer: Medicare Other | Source: Ambulatory Visit

## 2020-11-23 ENCOUNTER — Ambulatory Visit (HOSPITAL_COMMUNITY)
Admission: RE | Admit: 2020-11-23 | Discharge: 2020-11-23 | Disposition: A | Discharge status: Home / Self Care | Payer: Medicare Other | Source: Ambulatory Visit | Attending: Nephrology | Admitting: Nephrology

## 2020-11-23 ENCOUNTER — Other Ambulatory Visit: Payer: Self-pay

## 2020-11-23 VITALS — BP 172/74 | HR 81 | Temp 97.7°F

## 2020-11-23 DIAGNOSIS — N1832 Chronic kidney disease, stage 3b: Secondary | ICD-10-CM | POA: Diagnosis present

## 2020-11-23 LAB — POCT HEMOGLOBIN-HEMACUE: Hemoglobin: 11.9 g/dL — ABNORMAL LOW (ref 13.0–17.0)

## 2020-11-23 MED ORDER — EPOETIN ALFA-EPBX 10000 UNIT/ML IJ SOLN
INTRAMUSCULAR | Status: AC
  Filled 2020-11-23: qty 1

## 2020-11-23 MED ORDER — EPOETIN ALFA-EPBX 10000 UNIT/ML IJ SOLN
10000.0000 [IU] | INTRAMUSCULAR | Status: DC
  Administered 2020-11-23: 10000 [IU] via SUBCUTANEOUS

## 2020-11-23 NOTE — Progress Notes (Shared)
[facility] Note  11/25/2020                        CHIEF COMPLAINT Patient presents for No chief complaint on file.   HISTORY OF PRESENT ILLNESS: Louis Peters is a 55 y.o. male who presents to the clinic today for:   previous pt being referred back by Dr. Herbert Deaner, pt states his left eye is a little blurry, pt had a stroke in October 2021  Referring physician: Bernerd Limbo, [address],  [place] [address]  HISTORICAL INFORMATION:   Selected notes from the [facility] Referred by Dr. [name] for concern of retinal traction LEE: 09.25.19 (K. [facility]) [BCVA: OD: CF_0  OS: 20/40] Ocular Hx-vitreous hemorrhage OS, traction detachment OD, PDR OU Previous eye docs: [name] Last visit w/ [name] was in 2016 -- was supposed to schedule surgery OS (PPV, MP, EL) but pt never followed up PMH-DM (last A1C: 11.1, takes humalog, lantus), HTN     CURRENT MEDICATIONS: No current outpatient medications on file. (Ophthalmic Drugs)   No current facility-administered medications for this visit. (Ophthalmic Drugs)   Current Outpatient Medications (Other)  Medication Sig  . acetaminophen (TYLENOL) 500 MG tablet Take 500 mg by mouth every 6 (six) hours as needed for moderate pain or headache.  Marland Kitchen amLODipine (NORVASC) 10 MG tablet Take 10 mg by mouth daily.   Marland Kitchen ammonium lactate (LAC-HYDRIN) 12 % lotion Apply 1 application topically daily.  Marland Kitchen aspirin EC 81 MG EC tablet Take 1 tablet (81 mg total) by mouth daily. Swallow whole.  Marland Kitchen atorvastatin (LIPITOR) 80 MG tablet Take 1 tablet (80 mg total) by mouth daily.  Marland Kitchen BAQSIMI ONE PACK 3 MG/DOSE POWD Place 1 spray into the nose daily as needed (For low Glucose).  . Blood Glucose Monitoring Suppl ([place]) w/Device KIT by Does not apply route.  . cholecalciferol (VITAMIN D3) 25 MCG (1000 UNIT) tablet Take 1,000 Units by mouth daily.  . Continuous  Blood Gluc Receiver (FREESTYLE LIBRE 2 READER) DEVI by Does not apply route.  Marland Kitchen HUMALOG KWIKPEN 100 UNIT/ML KiwkPen Inject 5-8 Units into the skin See admin instructions. Take 5 units at breakfast, 5 units with lunch, and 8 units at dinner.  . hydrALAZINE (APRESOLINE) 100 MG tablet Take 100 mg by mouth 2 (two) times daily.  . hydrochlorothiazide (HYDRODIURIL) 25 MG tablet Take 25 mg by mouth daily.  . insulin degludec (TRESIBA) 100 UNIT/ML SOPN FlexTouch Pen Inject 14 Units into the skin at bedtime.   . Insulin Disposable Pump (OMNIPOD DASH 5 PACK PODS) MISC REPLACE POD EVERY 72 HOURS  . Insulin Pen Needle 32G X 4 MM MISC Use with lantus and humalog 4 imes per day  . losartan (COZAAR) 100 MG tablet Take 100 mg by mouth daily.   . ONE TOUCH LANCETS MISC Use to check blood sugar 8 time(s) daily  . oxyCODONE-acetaminophen (PERCOCET) 5-325 MG tablet Take 1 tablet by mouth every 4 (four) hours as needed for severe pain.   No current facility-administered medications for this visit. (Other)   Facility-Administered Medications Ordered in Other Visits (Other)  Medication Route  . epoetin alfa-epbx (RETACRIT) 23300 UNIT/ML injection   . epoetin alfa-epbx (RETACRIT) injection 10,000 Units Subcutaneous      REVIEW OF SYSTEMS:    ALLERGIES Allergies  Allergen Reactions  . Fexofenadine Rash  . Latex Rash  PAST MEDICAL HISTORY Past Medical History:  Diagnosis Date  . Benign hypertension with chronic kidney disease, stage III ([place]) 03/10/2019  . Chronic kidney disease (CKD), stage III (moderate) ([place]) 03/10/2019  . Diabetes mellitus type 1, uncomplicated ([place]) 11/04/3005  . Diabetes mellitus without complication ([place])   . Diabetic retinopathy ([place])    PDR OU  . Heat Injury 03/10/2019  . Hypercholesterolemia 03/10/2019  . Hypertension   . Retinal detachment    TRD OD   Past Surgical History:  Procedure Laterality Date  . AV FISTULA PLACEMENT Right 11/08/2020   Procedure: RIGHT ARM  BRACHIOCEPHALIC ARTERIOVENOUS (AV) FISTULA CREATION;  Surgeon: Angelia Mould, MD;  Location: [facility];  Service: Vascular;  Laterality: Right;  . CATARACT EXTRACTION    . EYE SURGERY    . RETINAL DETACHMENT SURGERY      FAMILY HISTORY Family History  Problem Relation Age of Onset  . Diabetes Maternal Grandmother   . Diabetes Sister     SOCIAL HISTORY Social History   Tobacco Use  . Smoking status: Never Smoker  . Smokeless tobacco: Never Used  Vaping Use  . Vaping Use: Never used  Substance Use Topics  . Alcohol use: Yes    Comment: occ  . Drug use: No         OPHTHALMIC EXAM:  Not recorded     IMAGING AND PROCEDURES  Imaging and Procedures for _0 @           ASSESSMENT/PLAN:    ICD-10-CM   1. Proliferative diabetic retinopathy of left eye with macular edema associated with type 2 diabetes mellitus ([place])  M22.6333   2. Retinal edema  H35.81   3. 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 Essential hypertension  I10   5. Hypertensive retinopathy of both eyes  H35.033   6. Combined forms of age-related cataract of both eyes  H25.813     1,2. Proliferative diabetic retinopathy OU  - pt lost to f/u here since 02.12.2020  - OD -- chronic, macula-involving TRD -- 20/400 vision -- severe retinal ischemia  - OS -- vitreous traction with macular edema / retinoschisis of superior macula -- BCVA 20/30-2  - former pt of Dr. Zadie Rhine in 2016 -- records reviewed, at that time, OD was already detached with VA 20/400, OS was to undergo PPV/MP/EL, but pt never had the surgery or followed up with [name]  - FA (01.13.20) shows active NVE OS, OD with minimal leakage   - OCT shows chronic mac off TRD OD, and OS with tractional retinoschisis / macular edema of superior macula -- mild interval improvement today compared to 2020 study  - VA 20/25  OS  - S/P PRP fill-in OS (10.01.19), fill-in (01.13.20)  - new onset vitreous hemorrhage following previous laser, improved today  - discussed possible need for surgery in the future.   - F/U 3-4 weeks, OCT, DFE, FA transit OS  - OS may eventually need PPV / MP to relieve tractional retinoschisis, but with VA 20/25, will monitor for now  3. Chronic TRD OD-   - fovea involving chronic TRD OD -- periphery attached by laser PRP  - severe ischemia / arteriolar sclerosis OD -- low vision potential  - review of records for [name] indicate mac off TRD has been present since early 2016 -- now 3+ years  - discussed findings and poor prognosis  -  do not recommend surgical intervention OD -- high risk, low benefit  - pt not interested in surgical intervention at this time  - monitor  4,5. Hypertensive retinopathy OU  - discussed importance of tight BP control  - monitor  6. Combined form age related cataracts OU-  - The symptoms of cataract, surgical options, and treatments and risks were discussed with patient.  - discussed diagnosis and progression  - not yet visually significant  - monitor   Ophthalmic Meds Ordered this visit:  No orders of the defined types were placed in this encounter.      No follow-ups on file.  There are no Patient Instructions on file for this visit.   Explained the diagnoses, plan, and follow up with the patient and they expressed understanding.  Patient expressed understanding of the importance of proper follow up care.   This document serves as a record of services personally performed by Gardiner Sleeper, MD, PhD. It was created on their behalf by Leonie Douglas, an ophthalmic technician. The creation of this record is the provider's dictation and/or activities during the visit.    Electronically signed by: Leonie Douglas COA, 11/23/20  1:04 PM  Gardiner Sleeper, M.D., Ph.D. Diseases & Surgery of the Retina and Vitreous [facility] 11/25/20  Abbreviations: M myopia (nearsighted); A astigmatism; H hyperopia (farsighted); P presbyopia; Mrx spectacle prescription;  CTL contact lenses; OD right eye; OS left eye; OU both eyes  XT exotropia; ET esotropia; PEK punctate epithelial keratitis; PEE punctate epithelial erosions; DES dry eye syndrome; MGD meibomian gland dysfunction; ATs artificial tears; PFAT's preservative free artificial tears; [place] nuclear sclerotic cataract; PSC posterior subcapsular cataract; ERM epi-retinal membrane; PVD posterior vitreous detachment; RD retinal detachment; DM diabetes mellitus; DR diabetic retinopathy; NPDR non-proliferative diabetic retinopathy; PDR proliferative diabetic retinopathy; CSME clinically significant macular edema; DME diabetic macular edema; dbh dot blot hemorrhages; CWS cotton wool spot; POAG primary open angle glaucoma; C/D cup-to-disc ratio; HVF humphrey visual field; GVF goldmann visual field; OCT optical coherence tomography; IOP intraocular pressure; BRVO Branch retinal vein occlusion; CRVO central retinal vein occlusion; CRAO central retinal artery occlusion; BRAO branch retinal artery occlusion; RT retinal tear; SB scleral buckle; PPV pars plana vitrectomy; VH Vitreous hemorrhage; PRP panretinal laser photocoagulation; IVK intravitreal kenalog; VMT vitreomacular traction; MH Macular hole;  NVD neovascularization of the disc; NVE neovascularization elsewhere; AREDS age related eye disease study; ARMD age related macular degeneration; POAG primary open angle glaucoma; EBMD epithelial/anterior basement membrane dystrophy; ACIOL anterior chamber intraocular lens; IOL intraocular lens; PCIOL posterior chamber intraocular lens; Phaco/IOL phacoemulsification with intraocular lens placement; [place] photorefractive keratectomy; LASIK laser assisted in situ keratomileusis; HTN hypertension; DM diabetes mellitus; COPD chronic obstructive pulmonary disease

## 2020-11-25 ENCOUNTER — Encounter (INDEPENDENT_AMBULATORY_CARE_PROVIDER_SITE_OTHER): Payer: Medicare Other | Admitting: Ophthalmology

## 2020-11-25 DIAGNOSIS — E113521 Type 2 diabetes mellitus with proliferative diabetic retinopathy with traction retinal detachment involving the macula, right eye: Secondary | ICD-10-CM

## 2020-11-25 DIAGNOSIS — H35033 Hypertensive retinopathy, bilateral: Secondary | ICD-10-CM

## 2020-11-25 DIAGNOSIS — H3581 Retinal edema: Secondary | ICD-10-CM

## 2020-11-25 DIAGNOSIS — H25813 Combined forms of age-related cataract, bilateral: Secondary | ICD-10-CM

## 2020-11-25 DIAGNOSIS — E113512 Type 2 diabetes mellitus with proliferative diabetic retinopathy with macular edema, left eye: Secondary | ICD-10-CM

## 2020-11-25 DIAGNOSIS — I10 Essential (primary) hypertension: Secondary | ICD-10-CM

## 2020-11-25 NOTE — Progress Notes (Signed)
[facility] Note  11/26/2020                        CHIEF COMPLAINT Patient presents for Retina Follow Up   HISTORY OF PRESENT ILLNESS: Louis Peters is a 55 y.o. male who presents to the clinic today for:   HPI    Retina Follow Up    Patient presents with  Diabetic Retinopathy.  In both eyes.  Duration of 4 weeks.  Since onset it is stable.  I, the attending physician,  performed the HPI with the patient and updated documentation appropriately.          Comments    4 week follow up [place] is about the same states pt OU.       Last edited by [name], MD on 11/26/2020  1:30 PM. (History)      Referring physician: Bernerd Limbo, MD [place],  [place] [address]  HISTORICAL INFORMATION:   Selected notes from the MEDICAL RECORD NUMBER Referred by Dr. [name] for concern of retinal traction LEE: 09.25.19 (K. Hallahan) [BCVA: OD: CF_0  OS: 20/40] Ocular Hx-vitreous hemorrhage OS, traction detachment OD, PDR OU Previous eye docs: [name] Last visit w/ [name] was in 2016 -- was supposed to schedule surgery OS (PPV, MP, EL) but pt never followed up PMH-DM (last A1C: 11.1, takes humalog, lantus), HTN     CURRENT MEDICATIONS: No current outpatient medications on file. (Ophthalmic Drugs)   No current facility-administered medications for this visit. (Ophthalmic Drugs)   Current Outpatient Medications (Other)  Medication Sig  . acetaminophen (TYLENOL) 500 MG tablet Take 500 mg by mouth every 6 (six) hours as needed for moderate pain or headache.  Marland Kitchen amLODipine (NORVASC) 10 MG tablet Take 10 mg by mouth daily.   Marland Kitchen ammonium lactate (LAC-HYDRIN) 12 % lotion Apply 1 application topically daily.  Marland Kitchen aspirin EC 81 MG EC tablet Take 1 tablet (81 mg total) by mouth daily. Swallow whole.  Marland Kitchen atorvastatin (LIPITOR) 80 MG tablet Take 1 tablet (80 mg total) by mouth daily.  Marland Kitchen BAQSIMI ONE PACK 3  MG/DOSE POWD Place 1 spray into the nose daily as needed (For low Glucose).  . cholecalciferol (VITAMIN D3) 25 MCG (1000 UNIT) tablet Take 1,000 Units by mouth daily.  Marland Kitchen HUMALOG KWIKPEN 100 UNIT/ML KiwkPen Inject 5-8 Units into the skin See admin instructions. Take 5 units at breakfast, 5 units with lunch, and 8 units at dinner.  . hydrALAZINE (APRESOLINE) 100 MG tablet Take 100 mg by mouth 2 (two) times daily.  . hydrochlorothiazide (HYDRODIURIL) 25 MG tablet Take 25 mg by mouth daily.  . insulin degludec (TRESIBA) 100 UNIT/ML SOPN FlexTouch Pen Inject 14 Units into the skin at bedtime.   . Insulin Disposable Pump (OMNIPOD DASH 5 PACK PODS) MISC REPLACE POD EVERY 72 HOURS  . losartan (COZAAR) 100 MG tablet Take 100 mg by mouth daily.   Marland Kitchen oxyCODONE-acetaminophen (PERCOCET) 5-325 MG tablet Take 1 tablet by mouth every 4 (four) hours as needed for severe pain.  . Blood Glucose Monitoring Suppl ([place]) w/Device KIT by Does not apply route.  . Continuous Blood Gluc Receiver (FREESTYLE LIBRE 2 READER) DEVI by Does not apply route.  . Insulin Pen Needle 32G X 4 MM MISC Use with lantus and humalog 4 imes per day  . ONE TOUCH LANCETS MISC Use to check  blood sugar 8 time(s) daily   No current facility-administered medications for this visit. (Other)      REVIEW OF SYSTEMS: ROS    Positive for: Neurological, Genitourinary, Endocrine, Eyes   Negative for: Constitutional, Gastrointestinal, Skin, Musculoskeletal, HENT, Cardiovascular, Respiratory, Psychiatric, Allergic/Imm, Heme/Lymph   Last edited by Leonie Douglas, COA on 11/26/2020  9:32 AM. (History)       ALLERGIES Allergies  Allergen Reactions  . Fexofenadine Rash  . Latex Rash    PAST MEDICAL HISTORY Past Medical History:  Diagnosis Date  . Benign hypertension with chronic kidney disease, stage III ([place]) 03/10/2019  . Chronic kidney disease (CKD), stage III (moderate) ([place]) 03/10/2019  . Diabetes mellitus type 1,  uncomplicated ([place]) 11/07/35  . Diabetes mellitus without complication ([place])   . Diabetic retinopathy ([place])    PDR OU  . Heat Injury 03/10/2019  . Hypercholesterolemia 03/10/2019  . Hypertension   . Retinal detachment    TRD OD   Past Surgical History:  Procedure Laterality Date  . AV FISTULA PLACEMENT Right 11/08/2020   Procedure: RIGHT ARM BRACHIOCEPHALIC ARTERIOVENOUS (AV) FISTULA CREATION;  Surgeon: Angelia Mould, MD;  Location: [facility];  Service: Vascular;  Laterality: Right;  . CATARACT EXTRACTION    . EYE SURGERY    . RETINAL DETACHMENT SURGERY      FAMILY HISTORY Family History  Problem Relation Age of Onset  . Diabetes Maternal Grandmother   . Diabetes Sister     SOCIAL HISTORY Social History   Tobacco Use  . Smoking status: Never Smoker  . Smokeless tobacco: Never Used  Vaping Use  . Vaping Use: Never used  Substance Use Topics  . Alcohol use: Yes    Comment: occ  . Drug use: No         OPHTHALMIC EXAM:  Base Eye Exam    Visual Acuity (Snellen - Linear)      Right Left   Dist cc 20/400- 20/25-   Dist ph cc NI NI   Correction: Glasses  OD he was only able to see the horizontal lines of the E       Tonometry (Tonopen, 9:38 AM)      Right Left   Pressure 11 9       Pupils      Dark Light Shape React APD   Right 4 3 Round Brisk None   Left 4 3 Round Brisk None       Visual Fields (Counting fingers)      Left Right    Full    Restrictions  Partial outer inferior temporal, superior nasal, inferior nasal deficiencies       Extraocular Movement      Right Left    Full Full       Neuro/Psych    Oriented x3: Yes   Mood/Affect: Normal       Dilation    Both eyes: 1.0% Mydriacyl, 2.5% Phenylephrine @ 9:38 AM        Slit Lamp and Fundus Exam    Slit Lamp Exam      Right Left   Lids/Lashes Dermatochalasis - upper lid, Meibomian gland dysfunction Dermatochalasis - upper lid, Meibomian gland dysfunction   Conjunctiva/Sclera  Melanosis Melanosis   Cornea Arcus Arcus   Anterior Chamber Deep and quiet Deep and quiet   Iris Round and dilated, No NVI Round and dilated, No NVI   Lens 2-3+ Nuclear sclerosis, 2-3+ Cortical cataract 2-3+ Nuclear sclerosis, 3+ Cortical cataract  Vitreous Vitreous syneresis VH improved centrally, boat shaped heme below inferior arcade improved, mild blood clots inferiorly       Fundus Exam      Right Left   Disc 1-2+ Pallor, fibrosis eminating to ST arcade, Sharp rim sharp rim, mild pallor, mild PPA / PPP   C/D Ratio 0.0 0.4   Macula TRD with vertical fibrotic band extending from ST to IT arcades, tractional fibrosis -- stable from prior Flat, blunted foveal reflex, +edema superior macula, Retinal pigment epithelial mottling   Vessels Severe Vascular attenuation, sclerotic arterioles, +fibrotic NV along temporal arcades - inactive Vascular attenuation, early arteriolar sclerosis, fibrosis along temporal arcades, fibrotic NVE along distal IT arcade -- regressing   Periphery Attached peripherally with 360 PRP laser, No heme  Attached, tractional retinoschisis along superior arcades; 360 PRP with good posterior fill in superiorly towards schisis, room for fill in         Refraction    Wearing Rx      Sphere Cylinder Axis Add   Right -0.50 +0.75 045 +1.75   Left -3.00 +1.25 155 +1.75          IMAGING AND PROCEDURES  Imaging and Procedures for _0 @  OCT, Retina - OU - Both Eyes       Right Eye Quality was good. Central Foveal Thickness: 396 (Unable to obtain). Progression has been stable. 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   Left Eye Quality was good. Central Foveal Thickness: 214. Progression has improved. Findings include abnormal foveal contour, no SRF, intraretinal fluid, vitreous traction (Mild Interval  improvement in vitreous opacities, persistent, stable tractional schisis).   Notes *Images captured and stored on drive  Diagnosis / Impression:  OD: chronic TRD, +SRF OS: non-central VMT along superotemporal arcades with tractional retinoschisis--stable; Mild Interval improvement in vitreous opacities  Clinical management:  See below  Abbreviations: NFP - Normal foveal profile. CME - cystoid macular edema. PED - pigment epithelial detachment. IRF - intraretinal fluid. SRF - subretinal fluid. EZ - ellipsoid zone. ERM - epiretinal membrane. ORA - outer retinal atrophy. ORT - outer retinal tubulation. SRHM - subretinal hyper-reflective material         Fluorescein Angiography Optos (Transit OS)       Right Eye   Progression has been stable. Early phase findings include staining, window defect. Mid/Late phase findings include staining, window defect (No NV).   Left Eye   Progression has improved. Early phase findings include staining, microaneurysm. Mid/Late phase findings include staining, leakage, microaneurysm (Mild perivascular leakage superior macula in area of tractional schisis, no frank NV).   Notes Images captured and stored on drive;   Impression: PDR OU OD: no active NV, diffuse staining and window defect OS: Mild perivascular leakage superior macula in area of tractional schisis, no frank NV -- improved from prior                 ASSESSMENT/PLAN:    ICD-10-CM   1. 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 Retinal edema  H35.81 OCT, Retina - OU - Both Eyes  3. Right eye affected by proliferative diabetic retinopathy with traction retinal detachment involving macula, associated with type 2 diabetes mellitus ([place])  Y50.3546   4. Essential hypertension  I10   5. Hypertensive retinopathy of both eyes  H35.033  Fluorescein Angiography Optos (Transit OS)  6. Combined forms  of age-related cataract of both eyes  H25.813     1,2. Proliferative diabetic retinopathy OU  - pt lost to f/u here from 02.12.2020 to 01.27.22  - OD -- chronic, macula-involving TRD -- 20/400 vision -- severe retinal ischemia  - OS -- vitreous traction with macular edema / retinoschisis of superior macula -- BCVA 20/30-2  - former pt of Dr. Zadie Rhine in 2016 -- records reviewed, at that time, OD was already detached with VA 20/400, OS was to undergo PPV/MP/EL, but pt never had the surgery or followed up with [name]  - history of new onset vitreous hemorrhage following previous laser  - S/P PRP fill-in OS (10.01.19), fill-in (01.13.20)  - FA (01.13.20) shows active NVE OS, OD with minimal leakage   - VA 20/25 OS -- stable  - OCT shows chronic mac off TRD OD, and OS with tractional retinoschisis / macular edema of superior macula -- stable improvement compared to 2020 study  - repeat FA 02.25.22 shows interval improvement in NVE OS -- minimal leakage along tractional schisis superior macula OS  - discussed possible need for surgery in the future  - OS may eventually need PPV / MP to relieve tractional retinoschisis, but with VA 20/25, will monitor for now  - F/U 3 months, sooner prn -- DFE, OCT  3. Chronic TRD OD-   - fovea involving chronic TRD OD -- periphery attached by laser PRP  - severe ischemia / arteriolar sclerosis OD -- low vision potential  - review of records for [name] indicate mac off TRD has been present since early 2016 -- now 3+ years  - discussed findings and poor prognosis  - do not recommend surgical intervention OD -- high risk, low benefit  - pt not interested in surgical intervention at this time  - monitor  4,5. Hypertensive retinopathy OU  - discussed importance of tight BP control  - monitor  6. Combined form age related cataracts OU-  - The symptoms of cataract, surgical options, and treatments and risks were discussed with patient.  - discussed diagnosis and  progression  - not yet visually significant  - monitor   Ophthalmic Meds Ordered this visit:  No orders of the defined types were placed in this encounter.      Return in about 3 months (around 02/23/2021) for f/u PDR OU, DFE, OCT.  There are no Patient Instructions on file for this visit.   Explained the diagnoses, plan, and follow up with the patient and they expressed understanding.  Patient expressed understanding of the importance of proper follow up care.   This document serves as a record of services personally performed by Gardiner Sleeper, MD, PhD. It was created on their behalf by San Jetty. Owens Shark, OA an ophthalmic technician. The creation of this record is the provider's dictation and/or activities during the visit.    Electronically signed by: San Jetty. [place], [place] 02.24.2022 3:24 PM  Gardiner Sleeper, M.D., Ph.D. Diseases & Surgery of the Retina and Vitreous [facility]  I have reviewed the above documentation for accuracy and completeness, and I agree with the above. Gardiner Sleeper, M.D., Ph.D. 11/26/20 3:24 PM  Abbreviations: M myopia (nearsighted); A astigmatism; H hyperopia (farsighted); P presbyopia; Mrx spectacle prescription;  CTL contact lenses; OD right eye; OS left eye; OU both eyes  XT exotropia; ET esotropia; PEK punctate epithelial keratitis; PEE punctate epithelial erosions; DES dry  eye syndrome; MGD meibomian gland dysfunction; ATs artificial tears; PFAT's preservative free artificial tears; [place] nuclear sclerotic cataract; PSC posterior subcapsular cataract; ERM epi-retinal membrane; PVD posterior vitreous detachment; RD retinal detachment; DM diabetes mellitus; DR diabetic retinopathy; NPDR non-proliferative diabetic retinopathy; PDR proliferative diabetic retinopathy; CSME clinically significant macular edema; DME diabetic macular edema; dbh dot blot hemorrhages; CWS cotton wool spot; POAG primary open angle glaucoma; C/D cup-to-disc ratio;  HVF humphrey visual field; GVF goldmann visual field; OCT optical coherence tomography; IOP intraocular pressure; BRVO Branch retinal vein occlusion; CRVO central retinal vein occlusion; CRAO central retinal artery occlusion; BRAO branch retinal artery occlusion; RT retinal tear; SB scleral buckle; PPV pars plana vitrectomy; VH Vitreous hemorrhage; PRP panretinal laser photocoagulation; IVK intravitreal kenalog; VMT vitreomacular traction; MH Macular hole;  NVD neovascularization of the disc; NVE neovascularization elsewhere; AREDS age related eye disease study; ARMD age related macular degeneration; POAG primary open angle glaucoma; EBMD epithelial/anterior basement membrane dystrophy; ACIOL anterior chamber intraocular lens; IOL intraocular lens; PCIOL posterior chamber intraocular lens; Phaco/IOL phacoemulsification with intraocular lens placement; [place] photorefractive keratectomy; LASIK laser assisted in situ keratomileusis; HTN hypertension; DM diabetes mellitus; COPD chronic obstructive pulmonary disease

## 2020-11-26 ENCOUNTER — Other Ambulatory Visit: Payer: Self-pay

## 2020-11-26 ENCOUNTER — Ambulatory Visit (INDEPENDENT_AMBULATORY_CARE_PROVIDER_SITE_OTHER): Payer: Medicare Other | Admitting: Ophthalmology

## 2020-11-26 ENCOUNTER — Encounter (INDEPENDENT_AMBULATORY_CARE_PROVIDER_SITE_OTHER): Payer: Self-pay | Admitting: Ophthalmology

## 2020-11-26 DIAGNOSIS — I10 Essential (primary) hypertension: Secondary | ICD-10-CM | POA: Diagnosis not present

## 2020-11-26 DIAGNOSIS — H25813 Combined forms of age-related cataract, bilateral: Secondary | ICD-10-CM

## 2020-11-26 DIAGNOSIS — E113521 Type 2 diabetes mellitus with proliferative diabetic retinopathy with traction retinal detachment involving the macula, right eye: Secondary | ICD-10-CM | POA: Diagnosis not present

## 2020-11-26 DIAGNOSIS — E113512 Type 2 diabetes mellitus with proliferative diabetic retinopathy with macular edema, left eye: Secondary | ICD-10-CM

## 2020-11-26 DIAGNOSIS — H35033 Hypertensive retinopathy, bilateral: Secondary | ICD-10-CM

## 2020-11-26 DIAGNOSIS — H3581 Retinal edema: Secondary | ICD-10-CM

## 2020-12-07 ENCOUNTER — Encounter (HOSPITAL_COMMUNITY)
Admission: RE | Admit: 2020-12-07 | Discharge: 2020-12-07 | Disposition: A | Discharge status: Home / Self Care | Payer: Medicare Other | Source: Ambulatory Visit | Attending: Nephrology | Admitting: Nephrology

## 2020-12-07 ENCOUNTER — Other Ambulatory Visit: Payer: Self-pay

## 2020-12-07 VITALS — BP 176/75 | HR 88 | Temp 97.2°F | Resp 18

## 2020-12-07 DIAGNOSIS — D631 Anemia in chronic kidney disease: Secondary | ICD-10-CM | POA: Insufficient documentation

## 2020-12-07 DIAGNOSIS — N1832 Chronic kidney disease, stage 3b: Secondary | ICD-10-CM | POA: Insufficient documentation

## 2020-12-07 LAB — IRON AND TIBC
Iron: 76 ug/dL (ref 45–182)
Saturation Ratios: 33 % (ref 17.9–39.5)
TIBC: 231 ug/dL — ABNORMAL LOW (ref 250–450)
UIBC: 155 ug/dL

## 2020-12-07 LAB — FERRITIN: Ferritin: 155 ng/mL (ref 24–336)

## 2020-12-07 LAB — POCT HEMOGLOBIN-HEMACUE: Hemoglobin: 11.8 g/dL — ABNORMAL LOW (ref 13.0–17.0)

## 2020-12-07 MED ORDER — EPOETIN ALFA-EPBX 10000 UNIT/ML IJ SOLN
10000.0000 [IU] | INTRAMUSCULAR | Status: DC
  Administered 2020-12-07: 10000 [IU] via SUBCUTANEOUS

## 2020-12-07 MED ORDER — EPOETIN ALFA-EPBX 10000 UNIT/ML IJ SOLN
INTRAMUSCULAR | Status: AC
  Filled 2020-12-07: qty 1

## 2020-12-10 ENCOUNTER — Other Ambulatory Visit: Payer: Self-pay

## 2020-12-10 DIAGNOSIS — N186 End stage renal disease: Secondary | ICD-10-CM

## 2020-12-15 ENCOUNTER — Ambulatory Visit (HOSPITAL_COMMUNITY)
Admission: RE | Admit: 2020-12-15 | Discharge: 2020-12-15 | Disposition: A | Discharge status: Home / Self Care | Payer: Medicare Other | Source: Ambulatory Visit | Attending: Vascular Surgery | Admitting: Vascular Surgery

## 2020-12-15 ENCOUNTER — Ambulatory Visit (INDEPENDENT_AMBULATORY_CARE_PROVIDER_SITE_OTHER): Payer: Medicare Other | Admitting: Physician Assistant

## 2020-12-15 ENCOUNTER — Other Ambulatory Visit: Payer: Self-pay

## 2020-12-15 ENCOUNTER — Encounter: Payer: Self-pay | Admitting: Physician Assistant

## 2020-12-15 VITALS — BP 118/63 | HR 70 | Temp 97.7°F | Resp 20 | Ht 70.0 in | Wt 179.6 lb

## 2020-12-15 DIAGNOSIS — N186 End stage renal disease: Secondary | ICD-10-CM | POA: Diagnosis not present

## 2020-12-15 DIAGNOSIS — N184 Chronic kidney disease, stage 4 (severe): Secondary | ICD-10-CM

## 2020-12-15 NOTE — Progress Notes (Addendum)
    Postoperative Access Visit   History of Present Illness   Louis Peters is a 55 y.o. year old male who presents for postoperative follow-up for: right brachiocephalic arteriovenous fistula by Dr. Scot Dock (Date: 11/08/20).  The patient's wounds are healed.  The patient denies steal symptoms.  The patient is able to complete their activities of daily living.  He is not yet on hemodialysis.  He is following up with Nephrology every 2 months.   Physical Examination   Vitals:   12/15/20 1432  BP: 118/63  Pulse: 70  Resp: 20  Temp: 97.7 F (36.5 C)  TempSrc: Temporal  SpO2: 98%  Weight: 179 lb 9.6 oz (81.5 kg)  Height: '5\' 10"'$  (1.778 m)   Body mass index is 25.77 kg/m.  right arm Incision is healed, palpable radial pulse, hand grip is 5/5, sensation in digits is intact, palpable thrill, bruit can be auscultated     Medical Decision Making   Louis Peters is a 55 y.o. year old male who presents s/p right brachiocephalic arteriovenous fistula   Patent R brachiocephalic fistula without signs or symptoms of steal syndrome  The patient's access will be ready for use 01/31/21  The patient may follow up on a prn basis  Please notify our office if HD is initiated and fistula use is required before the above date   Dagoberto Ligas PA-C [facility] Office: [phone]  Clinic MD: Scot Dock

## 2020-12-21 ENCOUNTER — Other Ambulatory Visit: Payer: Self-pay

## 2020-12-21 ENCOUNTER — Encounter (HOSPITAL_COMMUNITY)
Admission: RE | Admit: 2020-12-21 | Discharge: 2020-12-21 | Disposition: A | Discharge status: Home / Self Care | Payer: Medicare Other | Source: Ambulatory Visit | Attending: Nephrology | Admitting: Nephrology

## 2020-12-21 VITALS — BP 129/66 | HR 73 | Temp 97.3°F | Resp 18

## 2020-12-21 DIAGNOSIS — N1832 Chronic kidney disease, stage 3b: Secondary | ICD-10-CM | POA: Diagnosis not present

## 2020-12-21 LAB — POCT HEMOGLOBIN-HEMACUE: Hemoglobin: 12.2 g/dL — ABNORMAL LOW (ref 13.0–17.0)

## 2020-12-21 MED ORDER — EPOETIN ALFA-EPBX 10000 UNIT/ML IJ SOLN: 10000.0000 [IU] | INTRAMUSCULAR | Status: DC

## 2020-12-22 ENCOUNTER — Ambulatory Visit: Payer: Medicare Other | Admitting: Adult Health

## 2020-12-22 ENCOUNTER — Encounter: Payer: Self-pay | Admitting: Adult Health
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
## 2020-12-31 ENCOUNTER — Emergency Department (HOSPITAL_COMMUNITY): Payer: Medicare Other

## 2020-12-31 ENCOUNTER — Emergency Department (HOSPITAL_COMMUNITY)
Admission: EM | Admit: 2020-12-31 | Discharge: 2021-01-01 | Disposition: A | Discharge status: Home / Self Care | Payer: Medicare Other | Attending: Emergency Medicine | Admitting: Emergency Medicine

## 2020-12-31 ENCOUNTER — Other Ambulatory Visit: Payer: Self-pay

## 2020-12-31 DIAGNOSIS — Z7982 Long term (current) use of aspirin: Secondary | ICD-10-CM | POA: Diagnosis not present

## 2020-12-31 DIAGNOSIS — I129 Hypertensive chronic kidney disease with stage 1 through stage 4 chronic kidney disease, or unspecified chronic kidney disease: Secondary | ICD-10-CM | POA: Insufficient documentation

## 2020-12-31 DIAGNOSIS — E1022 Type 1 diabetes mellitus with diabetic chronic kidney disease: Secondary | ICD-10-CM | POA: Insufficient documentation

## 2020-12-31 DIAGNOSIS — Z79899 Other long term (current) drug therapy: Secondary | ICD-10-CM | POA: Diagnosis not present

## 2020-12-31 DIAGNOSIS — N183 Chronic kidney disease, stage 3 unspecified: Secondary | ICD-10-CM | POA: Diagnosis not present

## 2020-12-31 DIAGNOSIS — R55 Syncope and collapse: Secondary | ICD-10-CM | POA: Diagnosis not present

## 2020-12-31 DIAGNOSIS — Z91040 Latex allergy status: Secondary | ICD-10-CM | POA: Diagnosis not present

## 2020-12-31 DIAGNOSIS — R112 Nausea with vomiting, unspecified: Secondary | ICD-10-CM | POA: Insufficient documentation

## 2020-12-31 LAB — COMPREHENSIVE METABOLIC PANEL
ALT: 46 U/L — ABNORMAL HIGH (ref 0–44)
AST: 41 U/L (ref 15–41)
Albumin: 4.3 g/dL (ref 3.5–5.0)
Alkaline Phosphatase: 56 U/L (ref 38–126)
Anion gap: 8 (ref 5–15)
BUN: 75 mg/dL — ABNORMAL HIGH (ref 6–20)
CO2: 23 mmol/L (ref 22–32)
Calcium: 9.4 mg/dL (ref 8.9–10.3)
Chloride: 106 mmol/L (ref 98–111)
Creatinine, Ser: 5.38 mg/dL — ABNORMAL HIGH (ref 0.61–1.24)
GFR, Estimated: 12 mL/min — ABNORMAL LOW (ref >60)
Glucose, Bld: 241 mg/dL — ABNORMAL HIGH (ref 70–99)
Potassium: 4.7 mmol/L (ref 3.5–5.1)
Sodium: 137 mmol/L (ref 135–145)
Total Bilirubin: 1.2 mg/dL (ref 0.3–1.2)
Total Protein: 6.9 g/dL (ref 6.5–8.1)

## 2020-12-31 LAB — URINALYSIS, ROUTINE W REFLEX MICROSCOPIC
Bilirubin Urine: NEGATIVE
Glucose, UA: 50 mg/dL — AB
Hgb urine dipstick: NEGATIVE
Ketones, ur: NEGATIVE mg/dL
Leukocytes,Ua: NEGATIVE
Nitrite: NEGATIVE
Protein, ur: 100 mg/dL — AB
Specific Gravity, Urine: 1.014 (ref 1.005–1.030)
pH: 5 (ref 5.0–8.0)

## 2020-12-31 LAB — CBC WITH DIFFERENTIAL/PLATELET
Abs Immature Granulocytes: 0.04 10*3/uL (ref 0.00–0.07)
Basophils Absolute: 0 10*3/uL (ref 0.0–0.1)
Basophils Relative: 0 %
Eosinophils Absolute: 0.1 10*3/uL (ref 0.0–0.5)
Eosinophils Relative: 1 %
HCT: 38.3 % — ABNORMAL LOW (ref 39.0–52.0)
Hemoglobin: 12.1 g/dL — ABNORMAL LOW (ref 13.0–17.0)
Immature Granulocytes: 0 %
Lymphocytes Relative: 9 %
Lymphs Abs: 0.9 10*3/uL (ref 0.7–4.0)
MCH: 29.4 pg (ref 26.0–34.0)
MCHC: 31.6 g/dL (ref 30.0–36.0)
MCV: 93 fL (ref 80.0–100.0)
Monocytes Absolute: 0.5 10*3/uL (ref 0.1–1.0)
Monocytes Relative: 5 %
Neutro Abs: 8.5 10*3/uL — ABNORMAL HIGH (ref 1.7–7.7)
Neutrophils Relative %: 85 %
Platelets: 192 10*3/uL (ref 150–400)
RBC: 4.12 MIL/uL — ABNORMAL LOW (ref 4.22–5.81)
RDW: 13.7 % (ref 11.5–15.5)
WBC: 10 10*3/uL (ref 4.0–10.5)
nRBC: 0 % (ref 0.0–0.2)

## 2020-12-31 LAB — TROPONIN I (HIGH SENSITIVITY)
Troponin I (High Sensitivity): 9 ng/L (ref <18)
Troponin I (High Sensitivity): 10 ng/L (ref <18)

## 2020-12-31 LAB — CBG MONITORING, ED
Glucose-Capillary: 264 mg/dL — ABNORMAL HIGH (ref 70–99)
Glucose-Capillary: 308 mg/dL — ABNORMAL HIGH (ref 70–99)

## 2020-12-31 NOTE — ED Provider Notes (Signed)
MSE was initiated and I personally evaluated the patient and placed orders (if any) at  3:28 PM on December 31, 2020.  The patient appears stable so that the remainder of the MSE may be completed by another provider.  Patient was standing at the [facility] when he passed out.  Reportedly hit head.  He denies any pain.  He has a prior stroke causing him to have slurred speech.  This is unchanged.  No chest pain or shortness of breath.   Patient counseled on the need to stay for full evaluation, that a MSE does not replace full evaluation and examination.    Lorin Glass, PA-C 12/31/20 1529    Elnora Morrison, MD 01/01/21 1018

## 2020-12-31 NOTE — ED Triage Notes (Signed)
Pt reports was at the [facility] and fell down to the ground, Pt reports hit the back of his head however no  LOC. Pt reports about 3 episodes of vomit in the ambulance on the way to [place]. Pt denies any injury, chest pain, sob

## 2020-12-31 NOTE — ED Notes (Signed)
Is being seen by a kidney dr [phone]

## 2021-01-01 ENCOUNTER — Other Ambulatory Visit: Payer: Self-pay

## 2021-01-01 MED ORDER — ACETAMINOPHEN 325 MG PO TABS
650.0000 mg | ORAL_TABLET | Freq: Once | ORAL | Status: AC | PRN
  Administered 2021-01-01: 650 mg via ORAL
  Filled 2021-01-01: qty 2

## 2021-01-01 NOTE — ED Provider Notes (Signed)
[facility] EMERGENCY DEPARTMENT Provider Note   CSN: [id number] Arrival date & time: 12/31/20  1517     History Chief Complaint  Patient presents with  . Near Syncope    Louis Peters is a 55 y.o. male with a hx of T1DM, CKD, hypercholesterolemia, hypertension, & prior stroke who presents to the ED for evaluation S/p near syncope last night and later developed abdominal pain. Patient states he was at the [facility] when he began to feel lightheaded and subsequently fell to the ground, he states he did bump his head with the fall but did not lose consciousness throughout event. Ambulance came to the [facility] and he was evaluated there with reassuring vitals therefore he did not come to the ED and went home. At home he started to feel nauseated with generalized abdominal discomfort nausea, 2-3 episodes of emesis, and some diarrhea. All sxs have now resolved, he states he feels fine and ready to go home. No alleviating/aggravating factors. He denies fever, chest pain, dyspnea, hematemesis, melena, hematochezia, dysuria, retention, or current pain.   HPI     Past Medical History:  Diagnosis Date  . Benign hypertension with chronic kidney disease, stage III ([place]) 03/10/2019  . Chronic kidney disease (CKD), stage III (moderate) ([place]) 03/10/2019  . Diabetes mellitus type 1, uncomplicated ([place]) 01/06/9628  . Diabetes mellitus without complication ([place])   . Diabetic retinopathy ([place])    PDR OU  . Heat Injury 03/10/2019  . Hypercholesterolemia 03/10/2019  . Hypertension   . Retinal detachment    TRD OD    Patient Active Problem List   Diagnosis Date Noted  . Slurred speech 07/26/2020  . Hypertension   . Hypertensive urgency   . Ischemic stroke ([place])   . History of amputation of lesser toe ([place]) 07/30/2019  . AKI (acute kidney injury) ([place]) 03/10/2019  . Heat Injury 03/10/2019  . Chronic kidney disease (CKD), stage III (moderate) ([place]) 03/10/2019  . Benign hypertension with chronic  kidney disease, stage III ([place]) 03/10/2019  . Hypercholesterolemia 03/10/2019  . Acute kidney injury superimposed on chronic kidney disease ([place])   . Diabetic infection of left foot ([place]) 08/10/2018  . Hyperlipidemia due to type 1 diabetes mellitus ([place]) 07/31/2018  . Combined forms of age-related cataract of both eyes 01/25/2017  . Proliferative diabetic retinopathy of left eye without macular edema associated with type 1 diabetes mellitus ([place]) 01/25/2017  . Vitreous hemorrhage of left eye ([place]) 01/25/2017  . Fungal dermatitis 02/18/2016  . Diabetes mellitus type 1, uncomplicated ([place]) 52/84/1324  . Edema 11/27/2012  . Proteinuria 09/30/2012  . Diabetic peripheral neuropathy ([place]) 05/02/2012  . Proliferative diabetic retinopathy ([place]) 03/24/2012    Past Surgical History:  Procedure Laterality Date  . AV FISTULA PLACEMENT Right 11/08/2020   Procedure: RIGHT ARM BRACHIOCEPHALIC ARTERIOVENOUS (AV) FISTULA CREATION;  Surgeon: Angelia Mould, MD;  Location: [facility];  Service: Vascular;  Laterality: Right;  . CATARACT EXTRACTION    . EYE SURGERY    . RETINAL DETACHMENT SURGERY         Family History  Problem Relation Age of Onset  . Diabetes Maternal Grandmother   . Diabetes Sister     Social History   Tobacco Use  . Smoking status: Never Smoker  . Smokeless tobacco: Never Used  Vaping Use  . Vaping Use: Never used  Substance Use Topics  . Alcohol use: Yes    Comment: occ  . Drug use: No    Home Medications  Prior to Admission medications   Medication Sig Start Date End Date Taking? Authorizing Provider  acetaminophen (TYLENOL) 500 MG tablet Take 500 mg by mouth every 6 (six) hours as needed for moderate pain or headache.   Yes [provider]  amLODipine (NORVASC) 10 MG tablet Take 10 mg by mouth daily.  01/16/17  Yes [provider]  ammonium lactate (LAC-HYDRIN) 12 % lotion Apply 1 application topically daily. 10/01/18  Yes [provider]  aspirin EC 81 MG EC tablet Take 1 tablet (81 mg total) by mouth daily. Swallow whole. 07/24/20  Yes Vann, Jessica U, DO  atorvastatin (LIPITOR) 80 MG tablet Take 1 tablet (80 mg total) by mouth daily. 07/24/20  Yes Vann, Jessica U, DO  BAQSIMI ONE PACK 3 MG/DOSE POWD Place 1 spray into the nose daily as needed (For low Glucose). 02/23/20  Yes [provider]  Blood Glucose Monitoring Suppl ([place]) w/Device KIT by Does not apply route. 07/31/18  Yes [provider]  cholecalciferol (VITAMIN D3) 25 MCG (1000 UNIT) tablet Take 1,000 Units by mouth daily.   Yes [provider]  clopidogrel (PLAVIX) 75 MG tablet Take 75 mg by mouth daily. 11/28/20  Yes [provider]  Continuous Blood Gluc Receiver (FREESTYLE LIBRE 2 READER) DEVI by Does not apply route. 04/08/20  Yes [provider]  HUMALOG KWIKPEN 100 UNIT/ML KiwkPen Inject 5-8 Units into the skin See admin instructions. Take 5 units at breakfast, 5 units with lunch, and 8 units at dinner. 02/08/17  Yes [provider]  hydrALAZINE (APRESOLINE) 100 MG tablet Take 100 mg by mouth 3 (three) times daily.   Yes [provider]  hydrochlorothiazide (HYDRODIURIL) 25 MG tablet Take 25 mg by mouth daily. 05/14/20  Yes [provider]  insulin degludec (TRESIBA) 100 UNIT/ML SOPN FlexTouch Pen Inject 14 Units into the skin at bedtime.  04/30/19  Yes [provider]  Insulin Disposable Pump (OMNIPOD DASH 5 PACK PODS) MISC REPLACE POD EVERY 72 HOURS 06/24/20  Yes [provider]  Insulin Pen Needle 32G X 4 MM MISC Use with lantus and humalog 4 imes per day 07/26/17  Yes [provider]  labetalol (NORMODYNE) 100 MG tablet Take 100 mg by mouth 2 (two) times daily. 12/08/20  Yes [provider]  losartan (COZAAR) 100 MG tablet Take 100 mg by mouth daily.  08/12/18  Yes [provider]  ONE TOUCH LANCETS MISC Use to check blood sugar 8  time(s) daily 07/31/18  Yes [provider]  ONETOUCH VERIO test strip SMARTSIG:Via Meter 8 Times Daily 12/04/20  Yes [provider]  oxyCODONE-acetaminophen (PERCOCET) 5-325 MG tablet Take 1 tablet by mouth every 4 (four) hours as needed for severe pain. 11/08/20 11/08/21 Yes Baglia, Corrina, PA-C    Allergies    Fexofenadine and Latex  Review of Systems   Review of Systems  Constitutional: Negative for fever.  Respiratory: Negative for shortness of breath.   Cardiovascular: Negative for chest pain.  Gastrointestinal: Positive for abdominal pain, diarrhea, nausea and vomiting.  Genitourinary: Negative for difficulty urinating and dysuria.  Neurological: Positive for light-headedness (resolved). Negative for syncope.  All other systems reviewed and are negative.   Physical Exam Updated Vital Signs BP (!) 170/79 (BP Location: Left Arm)   Pulse 96   Temp 99.9 F (37.7 C) (Oral)   Resp 14   SpO2 99%   Physical Exam Vitals and nursing note reviewed.  Constitutional:  General: He is not in acute distress.    Appearance: He is well-developed. He is not toxic-appearing.  HENT:     Head: Normocephalic and atraumatic.  Eyes:     General:        Right eye: No discharge.        Left eye: No discharge.     Conjunctiva/sclera: Conjunctivae normal.  Cardiovascular:     Rate and Rhythm: Normal rate and regular rhythm.  Pulmonary:     Effort: Pulmonary effort is normal. No respiratory distress.     Breath sounds: Normal breath sounds. No wheezing, rhonchi or rales.  Abdominal:     General: There is no distension.     Palpations: Abdomen is soft.     Tenderness: There is no abdominal tenderness. There is no right CVA tenderness, left CVA tenderness, guarding or rebound.  Musculoskeletal:     Cervical back: Neck supple.  Skin:    General: Skin is warm and dry.     Findings: No rash.  Neurological:     Mental Status: He is alert.     Comments: Clear speech.    Psychiatric:        Behavior: Behavior normal.     ED Results / Procedures / Treatments   Labs (all labs ordered are listed, but only abnormal results are displayed) Labs Reviewed  CBC WITH DIFFERENTIAL/PLATELET - Abnormal; Notable for the following components:      Result Value   RBC 4.12 (*)    Hemoglobin 12.1 (*)    HCT 38.3 (*)    Neutro Abs 8.5 (*)    All other components within normal limits  COMPREHENSIVE METABOLIC PANEL - Abnormal; Notable for the following components:   Glucose, Bld 241 (*)    BUN 75 (*)    Creatinine, Ser 5.38 (*)    ALT 46 (*)    GFR, Estimated 12 (*)    All other components within normal limits  URINALYSIS, ROUTINE W REFLEX MICROSCOPIC - Abnormal; Notable for the following components:   Glucose, UA 50 (*)    Protein, ur 100 (*)    Bacteria, UA RARE (*)    All other components within normal limits  CBG MONITORING, ED - Abnormal; Notable for the following components:   Glucose-Capillary 264 (*)    All other components within normal limits  CBG MONITORING, ED - Abnormal; Notable for the following components:   Glucose-Capillary 308 (*)    All other components within normal limits  TROPONIN I (HIGH SENSITIVITY)  TROPONIN I (HIGH SENSITIVITY)    EKG EKG Interpretation  Date/Time:  Friday December 31 2020 15:24:26 EDT Ventricular Rate:  83 PR Interval:  188 QRS Duration: 86 QT Interval:  374 QTC Calculation: 439 R Axis:   71 Text Interpretation: Normal sinus rhythm Nonspecific T wave abnormality Abnormal ECG No significant change since last tracing Confirmed by Nanda Quinton [phone]) on 01/01/2021 7:22:08 AM   Radiology DG Chest 2 View  Result Date: 12/31/2020 CLINICAL DATA:  Syncope. EXAM: CHEST - 2 VIEW COMPARISON:  April 09, 2017. FINDINGS: The heart size and mediastinal contours are within normal limits. No pneumothorax or pleural effusion is noted. Both lungs are clear. The visualized skeletal structures are unremarkable. IMPRESSION: No active  cardiopulmonary disease. Electronically Signed   By: Marijo Conception M.D.   On: 12/31/2020 15:55    Procedures Procedures   Medications Ordered in ED Medications  acetaminophen (TYLENOL) tablet 650 mg (650 mg Oral Given 01/01/21 0225)  ED Course  I have reviewed the triage vital signs and the nursing notes.  Pertinent labs & imaging results that were available during my care of the patient were reviewed by me and considered in my medical decision making (see chart for details).    MDM Rules/Calculators/A&P                         Patient presents to the ED with complaints of near syncopal episode at the [facility] yesterday and subsequent abdominal pain with N/V/D, sxs now resolved. Nontoxic, temp borderline @ 100.1, BP somewhat elevated at times.   Additional history obtained:  Additional history obtained from chart review & nursing note review.   EKG: No significant change since last tracing.   Lab Tests:  I reviewed and interpreted labs, which included:  CBC: anemia similar to prior.  CMP: Renal function consistent w/ known CKD, patient is being prepared for dialysis by his outpatient providers, making urine, renal function similar to prior. Hyperglycemia without acidosis or anion gap elevation.  UA: Glucosuria, no ketonuria.  Troponin: Flat  Imaging Studies ordered:  CXR ordered by triage, I independently reviewed, formal radiology impression shows: No active cardiopulmonary disease.  ED Course:  Work-up overall reassuring, CKD known, hyperglycemia without DKA- has not had his insulin tonight. Abdomen nontender without peritoneal signs. EKG with no significant change compared to prior, troponins flat. Patient currently asymptomatic, feels ready to go home. Overall appears appropriate for discharge. I discussed results, treatment plan, need for follow-up, and return precautions with the patient. Provided opportunity for questions, patient confirmed understanding and is in agreement  with plan.   Findings and plan of care discussed with supervising physician Dr. Laverta Baltimore who has evaluated the patient & is in agreement.   Portions of this note were generated with [employer]obbyist. Dictation errors may occur despite best attempts at proofreading.  Final Clinical Impression(s) / ED Diagnoses Final diagnoses:  Near syncope  Non-intractable vomiting with nausea, unspecified vomiting type    Rx / DC Orders ED Discharge Orders    None       Amaryllis Dyke, PA-C 01/01/21 1443    Margette Fast, MD 01/04/21 1006

## 2021-01-01 NOTE — Discharge Instructions (Addendum)
You were seen in the emergency department today for nearly passing out at the [facility] as well as some nausea, vomiting, and abdominal pain.  Your work-up in the ER showed that your blood sugar is elevated and that your kidney function is abnormal.  Your kidney function looks similar to the last labs you have on record in our system.   Please be sure to take your insulin at home and monitor your blood sugar closely as it was elevated last night.  You have a pressure was also elevated somewhat, please have this rechecked at your follow-up appointment.  Please return to follow-up with your primary care provider within 3 days for reevaluation.  Return to the ER for new or worsening symptoms including but not limited to fever, inability to keep fluids down, blood in vomit or stool, return of abdominal pain, new or worsening pain, chest pain, trouble breathing, passing out, dizziness, or any other concerns.

## 2021-01-02 ENCOUNTER — Emergency Department (HOSPITAL_BASED_OUTPATIENT_CLINIC_OR_DEPARTMENT_OTHER): Payer: Medicare Other

## 2021-01-02 ENCOUNTER — Other Ambulatory Visit: Payer: Self-pay

## 2021-01-02 ENCOUNTER — Encounter (HOSPITAL_BASED_OUTPATIENT_CLINIC_OR_DEPARTMENT_OTHER): Payer: Self-pay | Admitting: *Deleted

## 2021-01-02 ENCOUNTER — Observation Stay (HOSPITAL_BASED_OUTPATIENT_CLINIC_OR_DEPARTMENT_OTHER)
Admission: EM | Admit: 2021-01-02 | Discharge: 2021-01-03 | Disposition: A | Discharge status: Home / Self Care | Payer: Medicare Other | Attending: Student | Admitting: Student

## 2021-01-02 DIAGNOSIS — E109 Type 1 diabetes mellitus without complications: Secondary | ICD-10-CM

## 2021-01-02 DIAGNOSIS — Z794 Long term (current) use of insulin: Secondary | ICD-10-CM | POA: Insufficient documentation

## 2021-01-02 DIAGNOSIS — Z79899 Other long term (current) drug therapy: Secondary | ICD-10-CM | POA: Insufficient documentation

## 2021-01-02 DIAGNOSIS — N185 Chronic kidney disease, stage 5: Secondary | ICD-10-CM | POA: Insufficient documentation

## 2021-01-02 DIAGNOSIS — E1065 Type 1 diabetes mellitus with hyperglycemia: Secondary | ICD-10-CM | POA: Insufficient documentation

## 2021-01-02 DIAGNOSIS — E1022 Type 1 diabetes mellitus with diabetic chronic kidney disease: Secondary | ICD-10-CM | POA: Diagnosis not present

## 2021-01-02 DIAGNOSIS — E1043 Type 1 diabetes mellitus with diabetic autonomic (poly)neuropathy: Principal | ICD-10-CM | POA: Insufficient documentation

## 2021-01-02 DIAGNOSIS — R1011 Right upper quadrant pain: Secondary | ICD-10-CM | POA: Diagnosis not present

## 2021-01-02 DIAGNOSIS — Z20822 Contact with and (suspected) exposure to covid-19: Secondary | ICD-10-CM | POA: Insufficient documentation

## 2021-01-02 DIAGNOSIS — Z7982 Long term (current) use of aspirin: Secondary | ICD-10-CM | POA: Insufficient documentation

## 2021-01-02 DIAGNOSIS — I120 Hypertensive chronic kidney disease with stage 5 chronic kidney disease or end stage renal disease: Secondary | ICD-10-CM | POA: Insufficient documentation

## 2021-01-02 DIAGNOSIS — R112 Nausea with vomiting, unspecified: Secondary | ICD-10-CM | POA: Diagnosis present

## 2021-01-02 LAB — LACTIC ACID, PLASMA
Lactic Acid, Venous: 2 mmol/L (ref 0.5–1.9)
Lactic Acid, Venous: 1.3 mmol/L (ref 0.5–1.9)

## 2021-01-02 LAB — COMPREHENSIVE METABOLIC PANEL
ALT: 33 U/L (ref 0–44)
AST: 32 U/L (ref 15–41)
Albumin: 4.5 g/dL (ref 3.5–5.0)
Alkaline Phosphatase: 57 U/L (ref 38–126)
Anion gap: 14 (ref 5–15)
BUN: 84 mg/dL — ABNORMAL HIGH (ref 6–20)
CO2: 19 mmol/L — ABNORMAL LOW (ref 22–32)
Calcium: 9.9 mg/dL (ref 8.9–10.3)
Chloride: 101 mmol/L (ref 98–111)
Creatinine, Ser: 5.71 mg/dL — ABNORMAL HIGH (ref 0.61–1.24)
GFR, Estimated: 11 mL/min — ABNORMAL LOW (ref >60)
Glucose, Bld: 357 mg/dL — ABNORMAL HIGH (ref 70–99)
Potassium: 4.5 mmol/L (ref 3.5–5.1)
Sodium: 134 mmol/L — ABNORMAL LOW (ref 135–145)
Total Bilirubin: 1.6 mg/dL — ABNORMAL HIGH (ref 0.3–1.2)
Total Protein: 7.6 g/dL (ref 6.5–8.1)

## 2021-01-02 LAB — I-STAT VENOUS BLOOD GAS, ED
Acid-Base Excess: 2 mmol/L (ref 0.0–2.0)
Acid-base deficit: 3 mmol/L — ABNORMAL HIGH (ref 0.0–2.0)
Bicarbonate: 21.1 mmol/L (ref 20.0–28.0)
Bicarbonate: 21.3 mmol/L (ref 20.0–28.0)
Calcium, Ion: 1.08 mmol/L — ABNORMAL LOW (ref 1.15–1.40)
Calcium, Ion: 1.22 mmol/L (ref 1.15–1.40)
HCT: 41 % (ref 39.0–52.0)
HCT: 38 % — ABNORMAL LOW (ref 39.0–52.0)
Hemoglobin: 13.9 g/dL (ref 13.0–17.0)
Hemoglobin: 12.9 g/dL — ABNORMAL LOW (ref 13.0–17.0)
O2 Saturation: 96 %
O2 Saturation: 97 %
Patient temperature: 100.2
Patient temperature: 99.4
Potassium: 4.5 mmol/L (ref 3.5–5.1)
Potassium: 3.8 mmol/L (ref 3.5–5.1)
Sodium: 137 mmol/L (ref 135–145)
Sodium: 140 mmol/L (ref 135–145)
TCO2: 22 mmol/L (ref 22–32)
TCO2: 22 mmol/L (ref 22–32)
pCO2, Ven: 21.1 mmHg — ABNORMAL LOW (ref 44.0–60.0)
pCO2, Ven: 36.7 mmHg — ABNORMAL LOW (ref 44.0–60.0)
pH, Ven: 7.61 (ref 7.250–7.430)
pH, Ven: 7.372 (ref 7.250–7.430)
pO2, Ven: 66 mmHg — ABNORMAL HIGH (ref 32.0–45.0)
pO2, Ven: 99 mmHg — ABNORMAL HIGH (ref 32.0–45.0)

## 2021-01-02 LAB — CBC WITH DIFFERENTIAL/PLATELET
Abs Immature Granulocytes: 0.04 10*3/uL (ref 0.00–0.07)
Basophils Absolute: 0.1 10*3/uL (ref 0.0–0.1)
Basophils Relative: 1 %
Eosinophils Absolute: 0 10*3/uL (ref 0.0–0.5)
Eosinophils Relative: 0 %
HCT: 39 % (ref 39.0–52.0)
Hemoglobin: 13.1 g/dL (ref 13.0–17.0)
Immature Granulocytes: 0 %
Lymphocytes Relative: 13 %
Lymphs Abs: 1.5 10*3/uL (ref 0.7–4.0)
MCH: 28.8 pg (ref 26.0–34.0)
MCHC: 33.6 g/dL (ref 30.0–36.0)
MCV: 85.7 fL (ref 80.0–100.0)
Monocytes Absolute: 0.7 10*3/uL (ref 0.1–1.0)
Monocytes Relative: 6 %
Neutro Abs: 9.4 10*3/uL — ABNORMAL HIGH (ref 1.7–7.7)
Neutrophils Relative %: 80 %
Platelets: 201 10*3/uL (ref 150–400)
RBC: 4.55 MIL/uL (ref 4.22–5.81)
RDW: 13.7 % (ref 11.5–15.5)
WBC: 11.8 10*3/uL — ABNORMAL HIGH (ref 4.0–10.5)
nRBC: 0 % (ref 0.0–0.2)

## 2021-01-02 LAB — CBG MONITORING, ED
Glucose-Capillary: 323 mg/dL — ABNORMAL HIGH (ref 70–99)
Glucose-Capillary: 303 mg/dL — ABNORMAL HIGH (ref 70–99)
Glucose-Capillary: 282 mg/dL — ABNORMAL HIGH (ref 70–99)

## 2021-01-02 LAB — RESP PANEL BY RT-PCR (FLU A&B, COVID) ARPGX2
Influenza A by PCR: NEGATIVE
Influenza B by PCR: NEGATIVE
SARS Coronavirus 2 by RT PCR: NEGATIVE

## 2021-01-02 LAB — GLUCOSE, CAPILLARY: Glucose-Capillary: 353 mg/dL — ABNORMAL HIGH (ref 70–99)

## 2021-01-02 LAB — LIPASE, BLOOD: Lipase: 41 U/L (ref 11–51)

## 2021-01-02 MED ORDER — ASPIRIN EC 81 MG PO TBEC
81.0000 mg | DELAYED_RELEASE_TABLET | Freq: Every morning | ORAL | Status: DC
  Administered 2021-01-03: 81 mg via ORAL
  Filled 2021-01-02: qty 1

## 2021-01-02 MED ORDER — MORPHINE SULFATE (PF) 4 MG/ML IV SOLN
4.0000 mg | Freq: Once | INTRAVENOUS | Status: AC
  Administered 2021-01-02: 4 mg via INTRAVENOUS
  Filled 2021-01-02: qty 1

## 2021-01-02 MED ORDER — HEPARIN SODIUM (PORCINE) 5000 UNIT/ML IJ SOLN
5000.0000 [IU] | Freq: Three times a day (TID) | INTRAMUSCULAR | Status: DC
  Administered 2021-01-02 – 2021-01-03 (×3): 5000 [IU] via SUBCUTANEOUS
  Filled 2021-01-02 (×3): qty 1

## 2021-01-02 MED ORDER — LABETALOL HCL 100 MG PO TABS
100.0000 mg | ORAL_TABLET | Freq: Two times a day (BID) | ORAL | Status: DC
  Administered 2021-01-03 (×2): 100 mg via ORAL
  Filled 2021-01-02 (×2): qty 1

## 2021-01-02 MED ORDER — SENNA 8.6 MG PO TABS
1.0000 | ORAL_TABLET | Freq: Two times a day (BID) | ORAL | Status: DC
  Administered 2021-01-02 – 2021-01-03 (×2): 8.6 mg via ORAL
  Filled 2021-01-02 (×2): qty 1

## 2021-01-02 MED ORDER — ATORVASTATIN CALCIUM 40 MG PO TABS
80.0000 mg | ORAL_TABLET | Freq: Every morning | ORAL | Status: DC
  Administered 2021-01-03: 80 mg via ORAL
  Filled 2021-01-02: qty 2

## 2021-01-02 MED ORDER — INSULIN ASPART 100 UNIT/ML ~~LOC~~ SOLN
0.0000 [IU] | Freq: Every day | SUBCUTANEOUS | Status: DC
  Administered 2021-01-02: 5 [IU] via SUBCUTANEOUS

## 2021-01-02 MED ORDER — CLOPIDOGREL BISULFATE 75 MG PO TABS
75.0000 mg | ORAL_TABLET | Freq: Every morning | ORAL | Status: DC
  Administered 2021-01-03: 75 mg via ORAL
  Filled 2021-01-02: qty 1

## 2021-01-02 MED ORDER — ACETAMINOPHEN 650 MG RE SUPP: 650.0000 mg | Freq: Four times a day (QID) | RECTAL | Status: DC | PRN

## 2021-01-02 MED ORDER — SODIUM CHLORIDE 0.9 % IV BOLUS
1000.0000 mL | Freq: Once | INTRAVENOUS | Status: AC
  Administered 2021-01-02: 1000 mL via INTRAVENOUS

## 2021-01-02 MED ORDER — INSULIN ASPART 100 UNIT/ML ~~LOC~~ SOLN
0.0000 [IU] | Freq: Three times a day (TID) | SUBCUTANEOUS | Status: DC
  Administered 2021-01-03: 2 [IU] via SUBCUTANEOUS
  Administered 2021-01-03: 3 [IU] via SUBCUTANEOUS
  Administered 2021-01-03: 15 [IU] via SUBCUTANEOUS

## 2021-01-02 MED ORDER — INSULIN GLARGINE 100 UNIT/ML ~~LOC~~ SOLN
8.0000 [IU] | Freq: Every day | SUBCUTANEOUS | Status: DC
  Filled 2021-01-02: qty 0.08

## 2021-01-02 MED ORDER — VITAMIN D 25 MCG (1000 UNIT) PO TABS
1000.0000 [IU] | ORAL_TABLET | Freq: Every morning | ORAL | Status: DC
  Administered 2021-01-03: 1000 [IU] via ORAL
  Filled 2021-01-02: qty 1

## 2021-01-02 MED ORDER — INSULIN ASPART 100 UNIT/ML ~~LOC~~ SOLN
8.0000 [IU] | Freq: Once | SUBCUTANEOUS | Status: AC
  Administered 2021-01-02: 8 [IU] via INTRAVENOUS

## 2021-01-02 MED ORDER — PROCHLORPERAZINE EDISYLATE 10 MG/2ML IJ SOLN: 10.0000 mg | Freq: Four times a day (QID) | INTRAMUSCULAR | Status: DC | PRN

## 2021-01-02 MED ORDER — ACETAMINOPHEN 325 MG PO TABS: 650.0000 mg | ORAL_TABLET | Freq: Four times a day (QID) | ORAL | Status: DC | PRN

## 2021-01-02 MED ORDER — INSULIN GLARGINE 100 UNIT/ML ~~LOC~~ SOLN
12.0000 [IU] | Freq: Every day | SUBCUTANEOUS | Status: DC
  Administered 2021-01-02: 12 [IU] via SUBCUTANEOUS
  Filled 2021-01-02 (×2): qty 0.12

## 2021-01-02 MED ORDER — ONDANSETRON HCL 4 MG/2ML IJ SOLN
4.0000 mg | Freq: Once | INTRAMUSCULAR | Status: AC
  Administered 2021-01-02: 4 mg via INTRAVENOUS
  Filled 2021-01-02: qty 2

## 2021-01-02 MED ORDER — AMLODIPINE BESYLATE 10 MG PO TABS
10.0000 mg | ORAL_TABLET | Freq: Every morning | ORAL | Status: DC
  Administered 2021-01-03: 10 mg via ORAL
  Filled 2021-01-02: qty 1

## 2021-01-02 MED ORDER — SODIUM CHLORIDE 0.9 % IV SOLN: INTRAVENOUS | Status: DC

## 2021-01-02 MED ORDER — SODIUM BICARBONATE 650 MG PO TABS
650.0000 mg | ORAL_TABLET | Freq: Three times a day (TID) | ORAL | Status: DC
  Administered 2021-01-02 – 2021-01-03 (×4): 650 mg via ORAL
  Filled 2021-01-02 (×4): qty 1

## 2021-01-02 MED ORDER — DOCUSATE SODIUM 100 MG PO CAPS
100.0000 mg | ORAL_CAPSULE | Freq: Two times a day (BID) | ORAL | Status: DC
  Administered 2021-01-02 – 2021-01-03 (×2): 100 mg via ORAL
  Filled 2021-01-02 (×2): qty 1

## 2021-01-02 MED ORDER — HYDRALAZINE HCL 50 MG PO TABS
100.0000 mg | ORAL_TABLET | Freq: Three times a day (TID) | ORAL | Status: DC
  Administered 2021-01-02 – 2021-01-03 (×3): 100 mg via ORAL
  Filled 2021-01-02 (×3): qty 2

## 2021-01-02 NOTE — ED Notes (Signed)
US at bedside

## 2021-01-02 NOTE — ED Provider Notes (Signed)
Emergency Department Provider Note   I have reviewed the triage vital signs and the nursing notes.   HISTORY  Chief Complaint Abdominal Pain   HPI Louis Peters is a 55 y.o. male with past medical history reviewed below including CKD with AV fistula placement in February, insulin-dependent diabetes, hypertension, high cholesterol, presents to the emergency department with recurrent abdominal pain, vomiting, subjective fevers at home.  Patient is having diffuse abdominal discomfort.  He denies chest pain or shortness of breath.  No cough, congestion, sore throat.  His blood sugars have been running high despite being compliant with his insulin.  He was seen in the emergency department yesterday with similar symptoms and ultimately discharged after treatment and feeling better.  The patient and family member at bedside state that he is gone home and continued vomiting and having cramping abdominal pain.  He is not able to keep anything down.  Continues to have some subjective fevers.  Denies any back pain.  No numbness or weakness.  Past Medical History:  Diagnosis Date  . Benign hypertension with chronic kidney disease, stage III ([place]) 03/10/2019  . Chronic kidney disease (CKD), stage III (moderate) ([place]) 03/10/2019  . Diabetes mellitus type 1, uncomplicated ([place]) [id number]  . Diabetes mellitus without complication ([place])   . Diabetic retinopathy ([place])    PDR OU  . Heat Injury 03/10/2019  . Hypercholesterolemia 03/10/2019  . Hypertension   . Retinal detachment    TRD OD    Patient Active Problem List   Diagnosis Date Noted  . Slurred speech 07/26/2020  . Hypertension   . Hypertensive urgency   . Ischemic stroke ([place])   . History of amputation of lesser toe ([place]) 07/30/2019  . AKI (acute kidney injury) ([place]) 03/10/2019  . Heat Injury 03/10/2019  . Chronic kidney disease (CKD), stage III (moderate) ([place]) 03/10/2019  . Benign hypertension with chronic kidney disease, stage III ([place])  03/10/2019  . Hypercholesterolemia 03/10/2019  . Acute kidney injury superimposed on chronic kidney disease ([place])   . Diabetic infection of left foot ([place]) 08/10/2018  . Hyperlipidemia due to type 1 diabetes mellitus ([place]) 07/31/2018  . Combined forms of age-related cataract of both eyes 01/25/2017  . Proliferative diabetic retinopathy of left eye without macular edema associated with type 1 diabetes mellitus ([place]) 01/25/2017  . Vitreous hemorrhage of left eye ([place]) 01/25/2017  . Fungal dermatitis 02/18/2016  . Diabetes mellitus type 1, uncomplicated ([place]) [id number]  . Edema 11/27/2012  . Proteinuria 09/30/2012  . Diabetic peripheral neuropathy ([place]) 05/02/2012  . Proliferative diabetic retinopathy ([place]) 03/24/2012    Past Surgical History:  Procedure Laterality Date  . AV FISTULA PLACEMENT Right 11/08/2020   Procedure: RIGHT ARM BRACHIOCEPHALIC ARTERIOVENOUS (AV) FISTULA CREATION;  Surgeon: Angelia Mould, MD;  Location: [facility];  Service: Vascular;  Laterality: Right;  . CATARACT EXTRACTION    . EYE SURGERY    . RETINAL DETACHMENT SURGERY      Allergies Fexofenadine and Latex  Family History  Problem Relation Age of Onset  . Diabetes Maternal Grandmother   . Diabetes Sister     Social History Social History   Tobacco Use  . Smoking status: Never Smoker  . Smokeless tobacco: Never Used  Vaping Use  . Vaping Use: Never used  Substance Use Topics  . Alcohol use: Yes    Comment: occ  . Drug use: No    Review of Systems  Constitutional: No fever/chills Eyes: No visual changes. ENT: No sore  throat. Cardiovascular: Denies chest pain. Respiratory: Denies shortness of breath. Gastrointestinal: Positive abdominal pain. Positive nausea and vomiting.  No diarrhea.  No constipation. Genitourinary: Negative for dysuria. Musculoskeletal: Negative for back pain. Skin: Negative for rash. Neurological: Negative for headaches, focal weakness or numbness.  10-point ROS  otherwise negative.  ____________________________________________   PHYSICAL EXAM:  VITAL SIGNS: ED Triage Vitals  Enc Vitals Group     BP 01/02/21 1031 (!) 99/50     Pulse Rate 01/02/21 1031 92     Resp 01/02/21 1031 (!) 22     Temp 01/02/21 1031 99.2 F (37.3 C)     Temp Source 01/02/21 1031 Oral     SpO2 01/02/21 1031 100 %     Weight 01/02/21 1036 177 lb (80.3 kg)     Height 01/02/21 1036 '5\' 10"'$  (1.778 m)   Constitutional: Alert and oriented.  Patient arrives looking very uncomfortable, clutching his abdomen, writhing in pain. Eyes: Conjunctivae are normal.  Head: Atraumatic. Nose: No congestion/rhinnorhea. Mouth/Throat: Mucous membranes are moist.  Neck: No stridor.  Cardiovascular: Normal rate, regular rhythm. Good peripheral circulation. Grossly normal heart sounds.   Respiratory: Normal respiratory effort.  No retractions. Lungs CTAB. Gastrointestinal: Soft with mild diffuse tenderness. No rebound or guarding. No distention.  Musculoskeletal: No lower extremity tenderness nor edema. No gross deformities of extremities. No midline thoracic or lumbar spine tenderness.  Neurologic:  Normal speech and language. No gross focal neurologic deficits are appreciated.  Skin:  Skin is warm, dry and intact. No rash noted.   ____________________________________________   LABS (all labs ordered are listed, but only abnormal results are displayed)  Labs Reviewed  CBC WITH DIFFERENTIAL/PLATELET - Abnormal; Notable for the following components:      Result Value   WBC 11.8 (*)    Neutro Abs 9.4 (*)    All other components within normal limits  COMPREHENSIVE METABOLIC PANEL - Abnormal; Notable for the following components:   Sodium 134 (*)    CO2 19 (*)    Glucose, Bld 357 (*)    BUN 84 (*)    Creatinine, Ser 5.71 (*)    Total Bilirubin 1.6 (*)    GFR, Estimated 11 (*)    All other components within normal limits  LACTIC ACID, PLASMA - Abnormal; Notable for the following  components:   Lactic Acid, Venous 2.0 (*)    All other components within normal limits  CBG MONITORING, ED - Abnormal; Notable for the following components:   Glucose-Capillary 323 (*)    All other components within normal limits  I-STAT VENOUS BLOOD GAS, ED - Abnormal; Notable for the following components:   pH, Ven 7.610 (*)    pCO2, Ven 21.1 (*)    pO2, Ven 66.0 (*)    Calcium, Ion 1.08 (*)    All other components within normal limits  RESP PANEL BY RT-PCR (FLU A&B, COVID) ARPGX2  CULTURE, BLOOD (ROUTINE X 2)  CULTURE, BLOOD (ROUTINE X 2)  LIPASE, BLOOD  LACTIC ACID, PLASMA  URINALYSIS, ROUTINE W REFLEX MICROSCOPIC   ____________________________________________  RADIOLOGY  CT ABDOMEN PELVIS WO CONTRAST  Result Date: 01/02/2021 CLINICAL DATA:  Generalized abdominal pain. EXAM: CT ABDOMEN AND PELVIS WITHOUT CONTRAST TECHNIQUE: Multidetector CT imaging of the abdomen and pelvis was performed following the standard protocol without IV contrast. COMPARISON:  None. FINDINGS: Lower chest: Lung bases are normal. Hepatobiliary: Liver, gallbladder and biliary tree are normal. Pancreas: Possible mild dilatation of the main pancreatic duct in the region  of the pancreatic head. This is not accurately evaluated on this noncontrast study. Spleen: Normal. Adrenals/Urinary Tract: Adrenal glands are normal. Kidneys are normal in size without hydronephrosis or nephrolithiasis. Ureters and bladder are normal. Stomach/Bowel: Stomach and small bowel are normal. Appendix not visualized. Colon is unremarkable. Vascular/Lymphatic: Abdominal aorta is normal in caliber. Remaining vascular structures are unremarkable. No definite adenopathy. Reproductive: Normal. Other: No free fluid or focal inflammatory change. Musculoskeletal: Minimal degenerate change of the spine and hips. There is a mild compression fracture of T12 involving the superior endplate with suggestion of acute to subacute component. IMPRESSION: 1. No  acute findings in the abdomen/pelvis. 2. Mild compression fracture of T12 involving the superior endplate age indeterminate, although suggestion of acute to subacute component which may account for patient's pain. 3. Possible mild dilatation of the main pancreatic duct in the region of the pancreatic head. This is not accurately evaluated on this noncontrast study. Recommend CT abdomen/pelvis with intravenous contrast on an elective basis for further evaluation. Electronically Signed   By: Marin Olp M.D.   On: 01/02/2021 12:03   DG Chest Portable 1 View  Result Date: 01/02/2021 CLINICAL DATA:  Altered mental status. EXAM: PORTABLE CHEST 1 VIEW COMPARISON:  December 31, 2020. FINDINGS: The heart size and mediastinal contours are within normal limits. Calcific atherosclerosis of the aorta. Both lungs are clear. No visible pleural effusions or pneumothorax. No acute osseous abnormality. Remote left rib fractures. IMPRESSION: No active disease. Electronically Signed   By: Margaretha Sheffield MD   On: 01/02/2021 11:44    ____________________________________________   PROCEDURES  Procedure(s) performed:   Procedures  None ____________________________________________   INITIAL IMPRESSION / ASSESSMENT AND PLAN / ED COURSE  Pertinent labs & imaging results that were available during my care of the patient were reviewed by me and considered in my medical decision making (see chart for details).   Patient presents to the emergency department with abdominal pain and vomiting continuing over the past several days.  This is his second ED visit with the same.  He appears very uncomfortable on arrival with dry heaving.  He is not having chest pain or shortness of breath symptoms.  Labs reviewed from yesterday showing hyperglycemia but no evidence of DKA.  He is describes some borderline fevers at home but no clear source for infection.   Lab work here shows very mild leukocytosis.  Lipase is normal.  Initial  lactic acid was 2 but then cleared to 1.3 with IV fluids.  He has CKD and hyperglycemia but no DKA.  pH is 7.6 likely from rapid breathing and respiratory compensation on arrival.  He looks more comfortable after IV fluids, nausea medication, pain medication.  Question some component of gastroparesis but with borderline fever, lactic acidosis, hyperglycemia plan for IV fluids and admit for monitoring and further inpatient treat.   Discussed patient's case with [facility] to request admission. Patient and family (if present) updated with plan. Care transferred to [facility] service.  I reviewed all nursing notes, vitals, pertinent old records, EKGs, labs, imaging (as available).  ____________________________________________  FINAL CLINICAL IMPRESSION(S) / ED DIAGNOSES  Final diagnoses:  RUQ abdominal pain     MEDICATIONS GIVEN DURING THIS VISIT:  Medications  insulin aspart (novoLOG) injection 8 Units (has no administration in time range)  sodium chloride 0.9 % bolus 1,000 mL (0 mLs Intravenous Stopped 01/02/21 1323)  morphine 4 MG/ML injection 4 mg (4 mg Intravenous Given 01/02/21 1111)  ondansetron (ZOFRAN) injection 4 mg (4  mg Intravenous Given 01/02/21 1111)    Note:  This document was prepared using Dragon voice recognition software and may include unintentional dictation errors.  Nanda Quinton, MD, [facility]    Donal Lynam, [facility], MD 01/04/21 [phone]

## 2021-01-02 NOTE — Progress Notes (Signed)
Notified by EDP of need for admission d/t intractable N/V and abdominal pain. [facility] accepts patient to tele bed at [facility]. EDP is to remain responsible for orders/medical decisions while patient is holding at [facility]. Upon arrival to [facility], [facility] will assume care. Nursing/Floor staff will call flow manager/carelink to notify them of patient's arrival so that the proper [facility] member may be contacted. Thank you.   Jonnie Finner, DO

## 2021-01-02 NOTE — ED Notes (Signed)
Pt cbg 282, carelink at bedside. Vs recorded. pts wife aware of patients room assignment .

## 2021-01-02 NOTE — Progress Notes (Signed)
Critical venous blood gas results given to ED MD.

## 2021-01-02 NOTE — Progress Notes (Signed)
Pt has arrived to Room 1532 from [facility]. Alert and oriented. Spouse at bedside.

## 2021-01-02 NOTE — ED Triage Notes (Signed)
Abdominal pain with vomiting x 1 week. Pt seen and discharged from [facility] last week. Pt moaning in triage. pts wife reports pt has been like he is now x 2 weeks.

## 2021-01-02 NOTE — ED Notes (Signed)
Patient transported to CT 
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
## 2021-01-02 NOTE — ED Notes (Signed)
Lactic 1300

## 2021-01-02 NOTE — ED Notes (Signed)
ED Provider at bedside to discuss admission. Room assignment given to family member at bedside

## 2021-01-03 DIAGNOSIS — K3184 Gastroparesis: Secondary | ICD-10-CM

## 2021-01-03 DIAGNOSIS — I10 Essential (primary) hypertension: Secondary | ICD-10-CM

## 2021-01-03 DIAGNOSIS — N186 End stage renal disease: Secondary | ICD-10-CM

## 2021-01-03 DIAGNOSIS — R112 Nausea with vomiting, unspecified: Secondary | ICD-10-CM | POA: Diagnosis not present

## 2021-01-03 DIAGNOSIS — N179 Acute kidney failure, unspecified: Secondary | ICD-10-CM | POA: Diagnosis not present

## 2021-01-03 DIAGNOSIS — D631 Anemia in chronic kidney disease: Secondary | ICD-10-CM

## 2021-01-03 DIAGNOSIS — R7989 Other specified abnormal findings of blood chemistry: Secondary | ICD-10-CM | POA: Diagnosis not present

## 2021-01-03 DIAGNOSIS — Z992 Dependence on renal dialysis: Secondary | ICD-10-CM

## 2021-01-03 DIAGNOSIS — N189 Chronic kidney disease, unspecified: Secondary | ICD-10-CM

## 2021-01-03 DIAGNOSIS — E1022 Type 1 diabetes mellitus with diabetic chronic kidney disease: Secondary | ICD-10-CM

## 2021-01-03 LAB — BLOOD CULTURE ID PANEL (REFLEXED) - BCID2

## 2021-01-03 LAB — CBC
HCT: 34.5 % — ABNORMAL LOW (ref 39.0–52.0)
Hemoglobin: 11 g/dL — ABNORMAL LOW (ref 13.0–17.0)
MCH: 29.2 pg (ref 26.0–34.0)
MCHC: 31.9 g/dL (ref 30.0–36.0)
MCV: 91.5 fL (ref 80.0–100.0)
Platelets: 163 10*3/uL (ref 150–400)
RBC: 3.77 MIL/uL — ABNORMAL LOW (ref 4.22–5.81)
RDW: 13.8 % (ref 11.5–15.5)
WBC: 8.5 10*3/uL (ref 4.0–10.5)
nRBC: 0 % (ref 0.0–0.2)

## 2021-01-03 LAB — COMPREHENSIVE METABOLIC PANEL
ALT: 25 U/L (ref 0–44)
AST: 20 U/L (ref 15–41)
Albumin: 3.6 g/dL (ref 3.5–5.0)
Alkaline Phosphatase: 48 U/L (ref 38–126)
Anion gap: 9 (ref 5–15)
BUN: 74 mg/dL — ABNORMAL HIGH (ref 6–20)
CO2: 23 mmol/L (ref 22–32)
Calcium: 8.9 mg/dL (ref 8.9–10.3)
Chloride: 107 mmol/L (ref 98–111)
Creatinine, Ser: 4.83 mg/dL — ABNORMAL HIGH (ref 0.61–1.24)
GFR, Estimated: 13 mL/min — ABNORMAL LOW (ref >60)
Glucose, Bld: 258 mg/dL — ABNORMAL HIGH (ref 70–99)
Potassium: 4 mmol/L (ref 3.5–5.1)
Sodium: 139 mmol/L (ref 135–145)
Total Bilirubin: 1.2 mg/dL (ref 0.3–1.2)
Total Protein: 6 g/dL — ABNORMAL LOW (ref 6.5–8.1)

## 2021-01-03 LAB — HEMOGLOBIN A1C
Hgb A1c MFr Bld: 7.8 % — ABNORMAL HIGH (ref 4.8–5.6)
Mean Plasma Glucose: 177.16 mg/dL

## 2021-01-03 LAB — GLUCOSE, CAPILLARY
Glucose-Capillary: 161 mg/dL — ABNORMAL HIGH (ref 70–99)
Glucose-Capillary: 124 mg/dL — ABNORMAL HIGH (ref 70–99)
Glucose-Capillary: 382 mg/dL — ABNORMAL HIGH (ref 70–99)

## 2021-01-03 MED ORDER — METOCLOPRAMIDE HCL 5 MG PO TABS
5.0000 mg | ORAL_TABLET | Freq: Three times a day (TID) | ORAL | Status: DC
  Administered 2021-01-03 (×2): 5 mg via ORAL
  Filled 2021-01-03 (×2): qty 1

## 2021-01-03 MED ORDER — METOCLOPRAMIDE HCL 5 MG PO TABS: ORAL_TABLET | ORAL | 0 refills | Status: DC

## 2021-01-03 MED ORDER — LOSARTAN POTASSIUM 50 MG PO TABS: 50.0000 mg | ORAL_TABLET | Freq: Every day | ORAL | 3 refills | Status: DC

## 2021-01-03 MED ORDER — FUROSEMIDE 40 MG PO TABS: 40.0000 mg | ORAL_TABLET | Freq: Every day | ORAL | 3 refills | Status: DC

## 2021-01-03 MED ORDER — METOCLOPRAMIDE HCL 5 MG/ML IJ SOLN
5.0000 mg | Freq: Three times a day (TID) | INTRAMUSCULAR | Status: DC
  Administered 2021-01-03: 5 mg via INTRAVENOUS
  Filled 2021-01-03: qty 2

## 2021-01-03 NOTE — Progress Notes (Signed)
PHARMACY - PHYSICIAN COMMUNICATION CRITICAL VALUE ALERT - BLOOD CULTURE IDENTIFICATION (BCID)  Louis Peters is an 55 y.o. male who presented to [facility] on 01/02/2021 with a chief complaint of intractable N/V.  Assessment:  No noted sources on infection on H&P, afebrile, WBC wnl, LA 1.3.   4/3 Bcx: 1/4 bottles, GPC clusters 4/4 BCID: + Staph species (not staph aureus), no methicillin resistance  Name of physician (or Provider) Contacted: Dr. Cyndia Skeeters  Current antibiotics: None  Changes to prescribed antibiotics recommended:  No changes needed, suspect contaminant.  Results for orders placed or performed during the hospital encounter of 01/02/21  Blood Culture ID Panel (Reflexed) (Collected: 01/02/2021 10:48 AM)  Result Value Ref Range   Enterococcus faecalis NOT DETECTED NOT DETECTED   Enterococcus Faecium NOT DETECTED NOT DETECTED   Listeria monocytogenes NOT DETECTED NOT DETECTED   Staphylococcus species DETECTED (A) NOT DETECTED   Staphylococcus aureus (BCID) NOT DETECTED NOT DETECTED   Staphylococcus epidermidis NOT DETECTED NOT DETECTED   Staphylococcus lugdunensis NOT DETECTED NOT DETECTED   Streptococcus species NOT DETECTED NOT DETECTED   Streptococcus agalactiae NOT DETECTED NOT DETECTED   Streptococcus pneumoniae NOT DETECTED NOT DETECTED   Streptococcus pyogenes NOT DETECTED NOT DETECTED   A.calcoaceticus-baumannii NOT DETECTED NOT DETECTED   Bacteroides fragilis NOT DETECTED NOT DETECTED   Enterobacterales NOT DETECTED NOT DETECTED   Enterobacter cloacae complex NOT DETECTED NOT DETECTED   Escherichia coli NOT DETECTED NOT DETECTED   Klebsiella aerogenes NOT DETECTED NOT DETECTED   Klebsiella oxytoca NOT DETECTED NOT DETECTED   Klebsiella pneumoniae NOT DETECTED NOT DETECTED   Proteus species NOT DETECTED NOT DETECTED   Salmonella species NOT DETECTED NOT DETECTED   Serratia marcescens NOT DETECTED NOT DETECTED   Haemophilus influenzae NOT DETECTED NOT DETECTED    Neisseria meningitidis NOT DETECTED NOT DETECTED   Pseudomonas aeruginosa NOT DETECTED NOT DETECTED   Stenotrophomonas maltophilia NOT DETECTED NOT DETECTED   Candida albicans NOT DETECTED NOT DETECTED   Candida auris NOT DETECTED NOT DETECTED   Candida glabrata NOT DETECTED NOT DETECTED   Candida krusei NOT DETECTED NOT DETECTED   Candida parapsilosis NOT DETECTED NOT DETECTED   Candida tropicalis NOT DETECTED NOT DETECTED   Cryptococcus neoformans/gattii NOT DETECTED NOT DETECTED    Gretta Arab PharmD, BCPS Clinical Pharmacist [facility] main pharmacy [phone] 01/03/2021 12:07 PM

## 2021-01-03 NOTE — Discharge Summary (Addendum)
Physician Discharge Summary  Louis Peters DSK:[id number] DOB: 09-11-1966 DOA: 01/02/2021  PCP: Bernerd Limbo, MD  Admit date: 01/02/2021 Discharge date: 01/03/2021  Admitted From: Home Disposition: Home  Recommendations for Outpatient Follow-up:  1. Follow ups as below. 2. Please obtain CBC/BMP/Mag at follow up 3. Please follow up on the following pending results: None  Home Health: None required Equipment/Devices: None required  Discharge Condition: Stable CODE STATUS: Full code   Follow-up Information    Bernerd Limbo, MD. Schedule an appointment as soon as possible for a visit in 1 week(s).   Specialty: Family Medicine Contact information: [address] [place] [id number] [phone]                Hospital Course: 55 year old M with PMH of IDDM-1, CKD-5, resistant HTN and HLD presenting with intractable nausea and vomiting for about 2 weeks and admitted for the same, AKI on CKD-5 with azotemia and hyperglycemia.  Patient was started on IV fluid and antiemetics with improvement in his symptoms, AKI, hyperglycemia and azotemia.  Blood culture with staph species in 1 out of 4 bottles thought to be contaminant.   On the day of discharge, patient tolerated soft diet and felt well to go home.  We changed his hydrochlorothiazide to Lasix given his low GFR.  We also reduced his losartan from 100 mg to 50 mg.  Patient to start these 2 medications after 3 to 4 days.  Patient has been encouraged to follow-up with primary care doctor and his nephrologist.   See individual problem list below for more on hospital course.   Discharge Diagnoses:  Intractable nausea and vomiting-felt to be due to gastroparesis and possibly from azotemia.  Resolved. -Reglan 5 mg every 6 hours for the next 5 days followed by every 8 hours as needed  Uncontrolled IDDM-1 with hyperglycemia, gastroparesis, retinopathy and CKD-5: A1c 7.8%.   -Not in DKA or HHS. -Discharged on  home medications   AKI on CKD-5 with azotemia: Improved. Recent Labs    07/22/20 0833 07/22/20 0840 07/22/20 1607 07/23/20 0349 11/08/20 0600 12/31/20 1533 01/02/21 1122 01/03/21 0302  BUN 44* 43*  --  37* 69* 75* 84* 74*  CREATININE 4.02* 4.00* 3.84* 3.81* 5.60* 5.38* 5.71* 4.83*  -Reduced losartan from 100 mg to 50 mg with the hope of getting some renal function back -Changed hydrochlorothiazide to Lasix -Check renal function at follow-up.  Resistant hypertension: Normotensive. -Reduce losartan from 100 mg to 50 mg daily -Changed hydrochlorothiazide to Lasix -Continue home amlodipine, labetalol and hydralazine -Could benefit from evaluation for secondary causes if he has not had one yet  Abnormal blood culture: Blood culture with staph species in 1 out of 4 bottles.  Not a staph aureus.  Likely contaminant.  Patient appears well with no leukocytosis or fever.  Hyperbilirubinemia -Continue home statin  Non-gap metabolic acidosis: Resolved.  Anemia of renal disease: Mild drop in H&H likely dilutional from IV fluid. Recent Labs    11/08/20 0600 11/09/20 0817 11/23/20 0803 12/07/20 0830 12/21/20 0810 12/31/20 1533 01/02/21 1108 01/02/21 1122 01/02/21 1422 01/03/21 0302  HGB 13.6 11.6* 11.9* 11.8* 12.2* 12.1* 13.9 13.1 12.9* 11.0*  -  Addendum Type 1 diabetes.  Body mass index is 23.47 kg/m.            Discharge Exam: Vitals:   01/03/21 0030 01/03/21 0434  BP: 121/68 (!) 151/83  Pulse: 73 71  Resp: 18 19  Temp: 98.5 F (36.9 C) 98.3  F (36.8 C)  SpO2: 98% 99%    GENERAL: No apparent distress.  Nontoxic. HEENT: MMM.  Vision and hearing grossly intact.  NECK: Supple.  No apparent JVD.  RESP:  No IWOB.  Fair aeration bilaterally. CVS:  RRR. Heart sounds normal.  ABD/GI/GU: Bowel sounds present. Soft. Non tender.  MSK/EXT:  Moves extremities. No apparent deformity. No edema.  SKIN: no apparent skin lesion or wound NEURO: Awake, alert and  oriented appropriately.  No apparent focal neuro deficit. PSYCH: Calm. Normal affect.  Discharge Instructions  Discharge Instructions    Call MD for:  extreme fatigue   Complete by: As directed    Call MD for:  persistant dizziness or light-headedness   Complete by: As directed    Call MD for:  persistant nausea and vomiting   Complete by: As directed    Call MD for:  severe uncontrolled pain   Complete by: As directed    Call MD for:  temperature >100.4   Complete by: As directed    Diet - low sodium heart healthy   Complete by: As directed    Diet Carb Modified   Complete by: As directed    Discharge instructions   Complete by: As directed    It has been a pleasure taking care of you!  You were hospitalized nausea and vomiting likely from gastroparesis (see a separate discharge instruction for more on this).  Your symptoms improved with intravenous fluid and nausea medications.  We are discharging you on Reglan.  Please follow the instruction on this medications before you take it.  I have also made some adjustment to your home medications during this hospitalization.  Patient list and the directions on your medications before you take them.  Please follow-up with your primary care doctor and your kidney doctor in 1 to 2 weeks or sooner if needed.   Take care,   Increase activity slowly   Complete by: As directed      Allergies as of 01/03/2021      Reactions   Fexofenadine Rash   Latex Rash      Medication List    STOP taking these medications   hydrochlorothiazide 25 MG tablet Commonly known as: HYDRODIURIL     TAKE these medications   amLODipine 10 MG tablet Commonly known as: NORVASC Take 10 mg by mouth every morning.   ammonium lactate 12 % lotion Commonly known as: LAC-HYDRIN Apply 1 application topically daily as needed for dry skin.   aspirin 81 MG EC tablet Take 1 tablet (81 mg total) by mouth daily. Swallow whole. What changed: when to take this    atorvastatin 80 MG tablet Commonly known as: LIPITOR Take 1 tablet (80 mg total) by mouth daily. What changed: when to take this   Baqsimi One Pack 3 MG/DOSE Powd Generic drug: Glucagon Place 3 mg into the nose once as needed (low blood sugar).   cholecalciferol 25 MCG (1000 UNIT) tablet Commonly known as: VITAMIN D3 Take 1,000 Units by mouth every morning.   clopidogrel 75 MG tablet Commonly known as: PLAVIX Take 75 mg by mouth every morning.   FreeStyle Libre 2 Reader Organ by Does not apply route.   furosemide 40 MG tablet Commonly known as: Lasix Take 1 tablet (40 mg total) by mouth daily. Start taking on: January 07, 2021   HumaLOG KwikPen 200 UNIT/ML KwikPen Generic drug: insulin lispro Inject 5-8 Units into the skin See admin instructions. Inject 5 units subcutaneously before  breakfast and lunch, inject 8 units before supper   hydrALAZINE 100 MG tablet Commonly known as: APRESOLINE Take 100 mg by mouth 3 (three) times daily.   insulin degludec 100 UNIT/ML FlexTouch Pen Commonly known as: TRESIBA Inject 12 Units into the skin at bedtime.   Insulin Pen Needle 32G X 4 MM Misc Use with lantus and humalog 4 imes per day   labetalol 100 MG tablet Commonly known as: NORMODYNE Take 100 mg by mouth 2 (two) times daily with breakfast and lunch.   losartan 50 MG tablet Commonly known as: Cozaar Take 1 tablet (50 mg total) by mouth daily. What changed:   medication strength  how much to take  when to take this   metoCLOPramide 5 MG tablet Commonly known as: REGLAN Take 1 tablet (5 mg total) by mouth 3 (three) times daily before meals for 5 days, THEN 1 tablet (5 mg total) every 6 (six) hours as needed for up to 5 days for nausea. Start taking on: January 03, 2021   ONE TOUCH LANCETS Misc Use to check blood sugar 8 time(s) daily   OneTouch Verio Flex System w/Device Kit by Does not apply route.   OneTouch Verio test strip Generic drug: glucose  blood SMARTSIG:Via Meter 8 Times Daily   oxyCODONE-acetaminophen 5-325 MG tablet Commonly known as: Percocet Take 1 tablet by mouth every 4 (four) hours as needed for severe pain.       Consultations:  None  Procedures/Studies:   CT ABDOMEN PELVIS WO CONTRAST  Result Date: 01/02/2021 CLINICAL DATA:  Generalized abdominal pain. EXAM: CT ABDOMEN AND PELVIS WITHOUT CONTRAST TECHNIQUE: Multidetector CT imaging of the abdomen and pelvis was performed following the standard protocol without IV contrast. COMPARISON:  None. FINDINGS: Lower chest: Lung bases are normal. Hepatobiliary: Liver, gallbladder and biliary tree are normal. Pancreas: Possible mild dilatation of the main pancreatic duct in the region of the pancreatic head. This is not accurately evaluated on this noncontrast study. Spleen: Normal. Adrenals/Urinary Tract: Adrenal glands are normal. Kidneys are normal in size without hydronephrosis or nephrolithiasis. Ureters and bladder are normal. Stomach/Bowel: Stomach and small bowel are normal. Appendix not visualized. Colon is unremarkable. Vascular/Lymphatic: Abdominal aorta is normal in caliber. Remaining vascular structures are unremarkable. No definite adenopathy. Reproductive: Normal. Other: No free fluid or focal inflammatory change. Musculoskeletal: Minimal degenerate change of the spine and hips. There is a mild compression fracture of T12 involving the superior endplate with suggestion of acute to subacute component. IMPRESSION: 1. No acute findings in the abdomen/pelvis. 2. Mild compression fracture of T12 involving the superior endplate age indeterminate, although suggestion of acute to subacute component which may account for patient's pain. 3. Possible mild dilatation of the main pancreatic duct in the region of the pancreatic head. This is not accurately evaluated on this noncontrast study. Recommend CT abdomen/pelvis with intravenous contrast on an elective basis for further  evaluation. Electronically Signed   By: Marin Olp M.D.   On: 01/02/2021 12:03   DG Chest 2 View  Result Date: 12/31/2020 CLINICAL DATA:  Syncope. EXAM: CHEST - 2 VIEW COMPARISON:  April 09, 2017. FINDINGS: The heart size and mediastinal contours are within normal limits. No pneumothorax or pleural effusion is noted. Both lungs are clear. The visualized skeletal structures are unremarkable. IMPRESSION: No active cardiopulmonary disease. Electronically Signed   By: Marijo Conception M.D.   On: 12/31/2020 15:55   DG Chest Portable 1 View  Result Date: 01/02/2021 CLINICAL DATA:  Altered  mental status. EXAM: PORTABLE CHEST 1 VIEW COMPARISON:  December 31, 2020. FINDINGS: The heart size and mediastinal contours are within normal limits. Calcific atherosclerosis of the aorta. Both lungs are clear. No visible pleural effusions or pneumothorax. No acute osseous abnormality. Remote left rib fractures. IMPRESSION: No active disease. Electronically Signed   By: Margaretha Sheffield MD   On: 01/02/2021 11:44   VAS [place] Angel[place] (AVF,AVG)  Result Date: 12/15/2020 DIALYSIS ACCESS Reason for Exam: Routine follow up. Access Site: Right Upper Extremity. Access Type: Brachial-cephalic AVF. History: Placed 11/08/20. Performing Technologist: Ralene Cork RVT  Examination Guidelines: A complete evaluation includes B-mode imaging, spectral Doppler, color Doppler, and power Doppler as needed of all accessible portions of each vessel. Unilateral testing is considered an integral part of a complete examination. Limited examinations for reoccurring indications may be performed as noted.  Findings: +--------------------+----------+-----------------+--------+ AVF                 PSV (cm/s)Flow Vol (mL/min)Comments +--------------------+----------+-----------------+--------+ Native artery inflow   169           803                +--------------------+----------+-----------------+--------+ AVF Anastomosis        275                               +--------------------+----------+-----------------+--------+  +------------+----------+-------------+----------+-----------------------------+ OUTFLOW VEINPSV (cm/s)Diameter (cm)Depth (cm)          Describe            +------------+----------+-------------+----------+-----------------------------+ Prox UA        329        0.50        0.81    sample taken at confluence                                                        with branch.          +------------+----------+-------------+----------+-----------------------------+ Mid UA       478->144     0.50        0.27       Increased velocity at                                                   confluence of small branch.  +------------+----------+-------------+----------+-----------------------------+ Dist UA        216        0.60        0.32                                 +------------+----------+-------------+----------+-----------------------------+ AC Fossa       324        0.51        0.77                                 +------------+----------+-------------+----------+-----------------------------+   Summary: Patent right BCAVF. No evidence of thrombus. Areas of increased velocity appear to be at the confluence of  branches. Velocities are not hemodynamically significant.  *See table(s) above for measurements and observations.  Diagnosing physician: Deitra Mayo MD Electronically signed by Deitra Mayo MD on 12/15/2020 at 2:51:36 PM.    --------------------------------------------------------------------------------   Final    US Abdomen Limited RUQ (LIVER/GB)  Result Date: 01/02/2021 CLINICAL DATA:  Right upper quadrant abdominal pain with nausea and vomiting for 1 week. History of diabetes. EXAM: ULTRASOUND ABDOMEN LIMITED RIGHT UPPER QUADRANT COMPARISON:  Abdominal CT 01/02/2021. FINDINGS: Gallbladder: No gallstones or wall thickening visualized. No sonographic Murphy  sign noted by sonographer. Common bile duct: Diameter: 3 mm Liver: No focal lesion identified. Within normal limits in parenchymal echogenicity. Portal vein is patent on color Doppler imaging with normal direction of blood flow towards the liver. Other: The sonographer notes a small amount of fluid superior to the left hemidiaphragm, likely corresponding with a small amount of pericardial fluid on earlier CT. No ascites. IMPRESSION: Unremarkable right upper quadrant abdominal ultrasound. Electronically Signed   By: Richardean Sale M.D.   On: 01/02/2021 14:48        The results of significant diagnostics from this hospitalization (including imaging, microbiology, ancillary and laboratory) are listed below for reference.     Microbiology: Recent Results (from the past 240 hour(s))  Blood culture (routine x 2)     Status: None (Preliminary result)   Collection Time: 01/02/21 10:48 AM   Specimen: BLOOD LEFT FOREARM  Result Value Ref Range Status   Specimen Description   Final    BLOOD LEFT FOREARM BLOOD Performed at [facility], [address] [place], [place] [address]    Special Requests   Final    BOTTLES DRAWN AEROBIC AND ANAEROBIC Blood Culture adequate volume Performed at [facility], [address] [place], [place] [address]    Culture  Setup Time   Final    GRAM POSITIVE COCCI IN CLUSTERS AEROBIC BOTTLE ONLY Organism ID to follow CRITICAL RESULT CALLED TO, READ BACK BY AND VERIFIED WITH: Melodye Ped PHARMD, AT 1147 01/03/21 BY D.VANHOOK Performed at [facility], [place] [address], [place], [place] [address]    Culture GRAM POSITIVE COCCI  Final   Report Status PENDING  Incomplete  Blood Culture ID Panel (Reflexed)     Status: Abnormal   Collection Time: 01/02/21 10:48 AM  Result Value Ref Range Status   Enterococcus faecalis NOT DETECTED NOT DETECTED Final   Enterococcus Faecium NOT DETECTED NOT DETECTED Final   Listeria monocytogenes NOT DETECTED  NOT DETECTED Final   Staphylococcus species DETECTED (A) NOT DETECTED Final    Comment: CRITICAL RESULT CALLED TO, READ BACK BY AND VERIFIED WITH: J. GADHIA PHARMD, AT 1147 01/03/21 BY D.VANHOOK    Staphylococcus aureus (BCID) NOT DETECTED NOT DETECTED Final   Staphylococcus epidermidis NOT DETECTED NOT DETECTED Final   Staphylococcus lugdunensis NOT DETECTED NOT DETECTED Final   Streptococcus species NOT DETECTED NOT DETECTED Final   Streptococcus agalactiae NOT DETECTED NOT DETECTED Final   Streptococcus pneumoniae NOT DETECTED NOT DETECTED Final   Streptococcus pyogenes NOT DETECTED NOT DETECTED Final   A.calcoaceticus-baumannii NOT DETECTED NOT DETECTED Final   Bacteroides fragilis NOT DETECTED NOT DETECTED Final   Enterobacterales NOT DETECTED NOT DETECTED Final   Enterobacter cloacae complex NOT DETECTED NOT DETECTED Final   Escherichia coli NOT DETECTED NOT DETECTED Final   Klebsiella aerogenes NOT DETECTED NOT DETECTED Final   Klebsiella oxytoca NOT DETECTED NOT DETECTED Final   Klebsiella pneumoniae NOT  DETECTED NOT DETECTED Final   Proteus species NOT DETECTED NOT DETECTED Final   Salmonella species NOT DETECTED NOT DETECTED Final   Serratia marcescens NOT DETECTED NOT DETECTED Final   Haemophilus influenzae NOT DETECTED NOT DETECTED Final   Neisseria meningitidis NOT DETECTED NOT DETECTED Final   Pseudomonas aeruginosa NOT DETECTED NOT DETECTED Final   Stenotrophomonas maltophilia NOT DETECTED NOT DETECTED Final   Candida albicans NOT DETECTED NOT DETECTED Final   Candida auris NOT DETECTED NOT DETECTED Final   Candida glabrata NOT DETECTED NOT DETECTED Final   Candida krusei NOT DETECTED NOT DETECTED Final   Candida parapsilosis NOT DETECTED NOT DETECTED Final   Candida tropicalis NOT DETECTED NOT DETECTED Final   Cryptococcus neoformans/gattii NOT DETECTED NOT DETECTED Final    Comment: Performed at [facility], [place] [address], [place], [place] [address]  Resp  Panel by RT-PCR (Flu A&B, Covid) Nasopharyngeal Swab     Status: None   Collection Time: 01/02/21 11:09 AM   Specimen: Nasopharyngeal Swab; Nasopharyngeal(NP) swabs in vial transport medium  Result Value Ref Range Status   SARS Coronavirus 2 by RT PCR NEGATIVE NEGATIVE Final    Comment: (NOTE) SARS-CoV-2 target nucleic acids are NOT DETECTED.  The SARS-CoV-2 RNA is generally detectable in upper respiratory specimens during the acute phase of infection. The lowest concentration of SARS-CoV-2 viral copies this assay can detect is 138 copies/mL. A negative result does not preclude SARS-Cov-2 infection and should not be used as the sole basis for treatment or other patient management decisions. A negative result may occur with  improper specimen collection/handling, submission of specimen other than nasopharyngeal swab, presence of viral mutation(s) within the areas targeted by this assay, and inadequate number of viral copies(<138 copies/mL). A negative result must be combined with clinical observations, patient history, and epidemiological information. The expected result is Negative.  Fact Sheet for Patients:  EntrepreneurPulse.com.au  Fact Sheet for Healthcare Providers:  IncredibleEmployment.be  This test is no t yet approved or cleared by the [place] FDA and  has been authorized for detection and/or diagnosis of SARS-CoV-2 by FDA under an Emergency Use Authorization (EUA). This EUA will remain  in effect (meaning this test can be used) for the duration of the COVID-19 declaration under Section 564(b)(1) of the Act, 21 U.S.C.section 360bbb-3(b)(1), unless the authorization is terminated  or revoked sooner.       Influenza A by PCR NEGATIVE NEGATIVE Final   Influenza B by PCR NEGATIVE NEGATIVE Final    Comment: (NOTE) The Xpert Xpress SARS-CoV-2/FLU/RSV plus assay is intended as an aid in the diagnosis of influenza from Nasopharyngeal  swab specimens and should not be used as a sole basis for treatment. Nasal washings and aspirates are unacceptable for Xpert Xpress SARS-CoV-2/FLU/RSV testing.  Fact Sheet for Patients: EntrepreneurPulse.com.au  Fact Sheet for Healthcare Providers: IncredibleEmployment.be  This test is not yet approved or cleared by the [place] FDA and has been authorized for detection and/or diagnosis of SARS-CoV-2 by FDA under an Emergency Use Authorization (EUA). This EUA will remain in effect (meaning this test can be used) for the duration of the COVID-19 declaration under Section 564(b)(1) of the Act, 21 U.S.C. section 360bbb-3(b)(1), unless the authorization is terminated or revoked.  Performed at [facility], [address] [place], [place] [address]      Labs:  CBC: Recent Labs  Lab 12/31/20 1533 01/02/21 1108 01/02/21 1122 01/02/21 1422 01/03/21 0302  WBC 10.0  --  11.8*  --  8.5  NEUTROABS 8.5*  --  9.4*  --   --   HGB 12.1* 13.9 13.1 12.9* 11.0*  HCT 38.3* 41.0 39.0 38.0* 34.5*  MCV 93.0  --  85.7  --  91.5  PLT 192  --  201  --  163   BMP &GFR Recent Labs  Lab 12/31/20 1533 01/02/21 1108 01/02/21 1122 01/02/21 1422 01/03/21 0302  NA 137 137 134* 140 139  K 4.7 4.5 4.5 3.8 4.0  CL 106  --  101  --  107  CO2 23  --  19*  --  23  GLUCOSE 241*  --  357*  --  258*  BUN 75*  --  84*  --  74*  CREATININE 5.38*  --  5.71*  --  4.83*  CALCIUM 9.4  --  9.9  --  8.9   Estimated Creatinine Clearance: 17.6 mL/min (A) (by C-G formula based on SCr of 4.83 mg/dL (H)). Liver & Pancreas: Recent Labs  Lab 12/31/20 1533 01/02/21 1122 01/03/21 0302  AST 41 32 20  ALT 46* 33 25  ALKPHOS 56 57 48  BILITOT 1.2 1.6* 1.2  PROT 6.9 7.6 6.0*  ALBUMIN 4.3 4.5 3.6   Recent Labs  Lab 01/02/21 1122  LIPASE 41   No results for input(s): AMMONIA in the last 168 hours. Diabetic: Recent Labs    01/03/21 0302  HGBA1C 7.8*    Recent Labs  Lab 01/02/21 1348 01/02/21 1449 01/02/21 2034 01/03/21 0737 01/03/21 1135  GLUCAP 303* 282* 353* 161* 124*   Cardiac Enzymes: No results for input(s): CKTOTAL, CKMB, CKMBINDEX, TROPONINI in the last 168 hours. No results for input(s): PROBNP in the last 8760 hours. Coagulation Profile: No results for input(s): INR, PROTIME in the last 168 hours. Thyroid Function Tests: No results for input(s): TSH, T4TOTAL, FREET4, T3FREE, THYROIDAB in the last 72 hours. Lipid Profile: No results for input(s): CHOL, HDL, LDLCALC, TRIG, CHOLHDL, LDLDIRECT in the last 72 hours. Anemia Panel: No results for input(s): VITAMINB12, FOLATE, FERRITIN, TIBC, IRON, RETICCTPCT in the last 72 hours. Urine analysis:    Component Value Date/Time   COLORURINE YELLOW 12/31/2020 1900   APPEARANCEUR CLEAR 12/31/2020 1900   LABSPEC 1.014 12/31/2020 1900   PHURINE 5.0 12/31/2020 1900   GLUCOSEU 50 (A) 12/31/2020 1900   HGBUR NEGATIVE 12/31/2020 1900   BILIRUBINUR NEGATIVE 12/31/2020 1900   KETONESUR NEGATIVE 12/31/2020 1900   PROTEINUR 100 (A) 12/31/2020 1900   NITRITE NEGATIVE 12/31/2020 1900   LEUKOCYTESUR NEGATIVE 12/31/2020 1900   Sepsis Labs: Invalid input(s): PROCALCITONIN, LACTICIDVEN   Time coordinating discharge: 40 minutes  SIGNED:  Mercy Riding, MD  [facility] 01/03/2021, 12:30 PM  If 7PM-7AM, please contact night-coverage [email or web address]

## 2021-01-04 ENCOUNTER — Other Ambulatory Visit: Payer: Self-pay

## 2021-01-04 ENCOUNTER — Encounter (HOSPITAL_COMMUNITY)
Admission: RE | Admit: 2021-01-04 | Discharge: 2021-01-04 | Disposition: A | Discharge status: Home / Self Care | Payer: Medicare Other | Source: Ambulatory Visit | Attending: Nephrology | Admitting: Nephrology

## 2021-01-04 VITALS — BP 99/51 | HR 78 | Temp 98.2°F | Resp 18

## 2021-01-04 DIAGNOSIS — D631 Anemia in chronic kidney disease: Secondary | ICD-10-CM | POA: Insufficient documentation

## 2021-01-04 DIAGNOSIS — N1832 Chronic kidney disease, stage 3b: Secondary | ICD-10-CM | POA: Insufficient documentation

## 2021-01-04 LAB — FERRITIN: Ferritin: 225 ng/mL (ref 24–336)

## 2021-01-04 LAB — IRON AND TIBC
Iron: 90 ug/dL (ref 45–182)
Saturation Ratios: 44 % — ABNORMAL HIGH (ref 17.9–39.5)
TIBC: 203 ug/dL — ABNORMAL LOW (ref 250–450)
UIBC: 113 ug/dL

## 2021-01-04 LAB — POCT HEMOGLOBIN-HEMACUE: Hemoglobin: 11 g/dL — ABNORMAL LOW (ref 13.0–17.0)

## 2021-01-04 MED ORDER — EPOETIN ALFA-EPBX 10000 UNIT/ML IJ SOLN
INTRAMUSCULAR | Status: AC
  Filled 2021-01-04: qty 1

## 2021-01-04 MED ORDER — EPOETIN ALFA-EPBX 10000 UNIT/ML IJ SOLN
10000.0000 [IU] | INTRAMUSCULAR | Status: DC
  Administered 2021-01-04: 10000 [IU] via SUBCUTANEOUS

## 2021-01-05 LAB — CULTURE, BLOOD (ROUTINE X 2): Special Requests: ADEQUATE

## 2021-01-07 LAB — CULTURE, BLOOD (ROUTINE X 2)
Culture: NO GROWTH
Special Requests: ADEQUATE

## 2021-01-18 ENCOUNTER — Other Ambulatory Visit: Payer: Self-pay

## 2021-01-18 ENCOUNTER — Encounter (HOSPITAL_COMMUNITY)
Admission: RE | Admit: 2021-01-18 | Discharge: 2021-01-18 | Disposition: A | Discharge status: Home / Self Care | Payer: Medicare Other | Source: Ambulatory Visit | Attending: Nephrology | Admitting: Nephrology

## 2021-01-18 VITALS — BP 123/65 | HR 81 | Temp 97.5°F | Resp 18

## 2021-01-18 DIAGNOSIS — N1832 Chronic kidney disease, stage 3b: Secondary | ICD-10-CM | POA: Diagnosis not present

## 2021-01-18 LAB — POCT HEMOGLOBIN-HEMACUE: Hemoglobin: 10.2 g/dL — ABNORMAL LOW (ref 13.0–17.0)

## 2021-01-18 MED ORDER — EPOETIN ALFA-EPBX 10000 UNIT/ML IJ SOLN
INTRAMUSCULAR | Status: AC
  Filled 2021-01-18: qty 1

## 2021-01-18 MED ORDER — EPOETIN ALFA-EPBX 10000 UNIT/ML IJ SOLN
10000.0000 [IU] | INTRAMUSCULAR | Status: DC
  Administered 2021-01-18: 10000 [IU] via SUBCUTANEOUS

## 2021-02-01 ENCOUNTER — Other Ambulatory Visit: Payer: Self-pay

## 2021-02-01 ENCOUNTER — Encounter (HOSPITAL_COMMUNITY)
Admission: RE | Admit: 2021-02-01 | Discharge: 2021-02-01 | Disposition: A | Discharge status: Home / Self Care | Payer: Medicare Other | Source: Ambulatory Visit | Attending: Nephrology | Admitting: Nephrology

## 2021-02-01 VITALS — BP 115/61 | HR 81 | Temp 97.6°F | Resp 18

## 2021-02-01 DIAGNOSIS — D631 Anemia in chronic kidney disease: Secondary | ICD-10-CM | POA: Insufficient documentation

## 2021-02-01 DIAGNOSIS — N1832 Chronic kidney disease, stage 3b: Secondary | ICD-10-CM | POA: Diagnosis present

## 2021-02-01 LAB — IRON AND TIBC
Iron: 40 ug/dL — ABNORMAL LOW (ref 45–182)
Saturation Ratios: 18 % (ref 17.9–39.5)
TIBC: 225 ug/dL — ABNORMAL LOW (ref 250–450)
UIBC: 185 ug/dL

## 2021-02-01 LAB — FERRITIN: Ferritin: 164 ng/mL (ref 24–336)

## 2021-02-01 LAB — POCT HEMOGLOBIN-HEMACUE: Hemoglobin: 11.2 g/dL — ABNORMAL LOW (ref 13.0–17.0)

## 2021-02-01 MED ORDER — EPOETIN ALFA-EPBX 10000 UNIT/ML IJ SOLN
10000.0000 [IU] | INTRAMUSCULAR | Status: DC
  Administered 2021-02-01: 10000 [IU] via SUBCUTANEOUS

## 2021-02-01 MED ORDER — EPOETIN ALFA-EPBX 10000 UNIT/ML IJ SOLN
INTRAMUSCULAR | Status: AC
  Filled 2021-02-01: qty 1

## 2021-02-15 ENCOUNTER — Other Ambulatory Visit: Payer: Self-pay

## 2021-02-15 ENCOUNTER — Encounter (HOSPITAL_COMMUNITY)
Admission: RE | Admit: 2021-02-15 | Discharge: 2021-02-15 | Disposition: A | Discharge status: Home / Self Care | Payer: Medicare Other | Source: Ambulatory Visit | Attending: Nephrology | Admitting: Nephrology

## 2021-02-15 VITALS — BP 159/91 | HR 80 | Temp 97.5°F | Resp 18

## 2021-02-15 DIAGNOSIS — N1832 Chronic kidney disease, stage 3b: Secondary | ICD-10-CM | POA: Diagnosis not present

## 2021-02-15 MED ORDER — EPOETIN ALFA-EPBX 10000 UNIT/ML IJ SOLN
10000.0000 [IU] | INTRAMUSCULAR | Status: DC
  Administered 2021-02-15: 10000 [IU] via SUBCUTANEOUS

## 2021-02-15 MED ORDER — EPOETIN ALFA-EPBX 10000 UNIT/ML IJ SOLN
INTRAMUSCULAR | Status: AC
  Filled 2021-02-15: qty 1

## 2021-02-16 ENCOUNTER — Other Ambulatory Visit: Payer: Self-pay

## 2021-02-16 ENCOUNTER — Emergency Department (HOSPITAL_COMMUNITY): Payer: Medicare Other

## 2021-02-16 ENCOUNTER — Emergency Department (HOSPITAL_COMMUNITY)
Admission: EM | Admit: 2021-02-16 | Discharge: 2021-02-16 | Disposition: A | Discharge status: Home / Self Care | Payer: Medicare Other | Attending: Emergency Medicine | Admitting: Emergency Medicine

## 2021-02-16 ENCOUNTER — Encounter (HOSPITAL_COMMUNITY): Payer: Self-pay

## 2021-02-16 DIAGNOSIS — Z91040 Latex allergy status: Secondary | ICD-10-CM | POA: Diagnosis not present

## 2021-02-16 DIAGNOSIS — E1022 Type 1 diabetes mellitus with diabetic chronic kidney disease: Secondary | ICD-10-CM | POA: Diagnosis not present

## 2021-02-16 DIAGNOSIS — Z794 Long term (current) use of insulin: Secondary | ICD-10-CM | POA: Insufficient documentation

## 2021-02-16 DIAGNOSIS — R4182 Altered mental status, unspecified: Secondary | ICD-10-CM | POA: Insufficient documentation

## 2021-02-16 DIAGNOSIS — E162 Hypoglycemia, unspecified: Secondary | ICD-10-CM | POA: Diagnosis present

## 2021-02-16 DIAGNOSIS — N183 Chronic kidney disease, stage 3 unspecified: Secondary | ICD-10-CM | POA: Diagnosis not present

## 2021-02-16 DIAGNOSIS — I129 Hypertensive chronic kidney disease with stage 1 through stage 4 chronic kidney disease, or unspecified chronic kidney disease: Secondary | ICD-10-CM | POA: Diagnosis not present

## 2021-02-16 DIAGNOSIS — Z8639 Personal history of other endocrine, nutritional and metabolic disease: Secondary | ICD-10-CM

## 2021-02-16 DIAGNOSIS — Z7982 Long term (current) use of aspirin: Secondary | ICD-10-CM | POA: Insufficient documentation

## 2021-02-16 DIAGNOSIS — Z79899 Other long term (current) drug therapy: Secondary | ICD-10-CM | POA: Insufficient documentation

## 2021-02-16 LAB — COMPREHENSIVE METABOLIC PANEL
ALT: 36 U/L (ref 0–44)
AST: 38 U/L (ref 15–41)
Albumin: 4.1 g/dL (ref 3.5–5.0)
Alkaline Phosphatase: 63 U/L (ref 38–126)
Anion gap: 9 (ref 5–15)
BUN: 57 mg/dL — ABNORMAL HIGH (ref 6–20)
CO2: 22 mmol/L (ref 22–32)
Calcium: 9.2 mg/dL (ref 8.9–10.3)
Chloride: 106 mmol/L (ref 98–111)
Creatinine, Ser: 4.72 mg/dL — ABNORMAL HIGH (ref 0.61–1.24)
GFR, Estimated: 14 mL/min — ABNORMAL LOW (ref >60)
Glucose, Bld: 93 mg/dL (ref 70–99)
Potassium: 4.2 mmol/L (ref 3.5–5.1)
Sodium: 137 mmol/L (ref 135–145)
Total Bilirubin: 0.6 mg/dL (ref 0.3–1.2)
Total Protein: 6.7 g/dL (ref 6.5–8.1)

## 2021-02-16 LAB — ETHANOL: Alcohol, Ethyl (B): <10 mg/dL (ref <10)

## 2021-02-16 LAB — CBG MONITORING, ED
Glucose-Capillary: 79 mg/dL (ref 70–99)
Glucose-Capillary: 113 mg/dL — ABNORMAL HIGH (ref 70–99)
Glucose-Capillary: 136 mg/dL — ABNORMAL HIGH (ref 70–99)
Glucose-Capillary: 164 mg/dL — ABNORMAL HIGH (ref 70–99)

## 2021-02-16 LAB — URINALYSIS, ROUTINE W REFLEX MICROSCOPIC
Bacteria, UA: NONE SEEN
Bilirubin Urine: NEGATIVE
Glucose, UA: NEGATIVE mg/dL
Hgb urine dipstick: NEGATIVE
Ketones, ur: NEGATIVE mg/dL
Leukocytes,Ua: NEGATIVE
Nitrite: NEGATIVE
Protein, ur: 100 mg/dL — AB
Specific Gravity, Urine: 1.009 (ref 1.005–1.030)
pH: 5 (ref 5.0–8.0)

## 2021-02-16 LAB — CBC
HCT: 37.8 % — ABNORMAL LOW (ref 39.0–52.0)
Hemoglobin: 12 g/dL — ABNORMAL LOW (ref 13.0–17.0)
MCH: 29.3 pg (ref 26.0–34.0)
MCHC: 31.7 g/dL (ref 30.0–36.0)
MCV: 92.2 fL (ref 80.0–100.0)
Platelets: 200 10*3/uL (ref 150–400)
RBC: 4.1 MIL/uL — ABNORMAL LOW (ref 4.22–5.81)
RDW: 14.3 % (ref 11.5–15.5)
WBC: 5.1 10*3/uL (ref 4.0–10.5)
nRBC: 0 % (ref 0.0–0.2)

## 2021-02-16 LAB — POCT HEMOGLOBIN-HEMACUE: Hemoglobin: 11.3 g/dL — ABNORMAL LOW (ref 13.0–17.0)

## 2021-02-16 LAB — RAPID URINE DRUG SCREEN, HOSP PERFORMED
Amphetamines: NOT DETECTED
Barbiturates: NOT DETECTED
Benzodiazepines: NOT DETECTED
Cocaine: NOT DETECTED
Opiates: NOT DETECTED
Tetrahydrocannabinol: NOT DETECTED

## 2021-02-16 NOTE — ED Notes (Signed)
Pt given PB with graham crackers and apple juice.

## 2021-02-16 NOTE — ED Notes (Signed)
Patient transported to CT 

## 2021-02-16 NOTE — ED Notes (Signed)
Pt back from CT, bair hugger reapplied

## 2021-02-16 NOTE — Discharge Instructions (Addendum)
It was our pleasure to provide your ER care today - we hope that you feel better.  Make sure to eat meals regularly, and not to delay or skip meals.    If you begin to feel your blood sugar is low, eat or drink something immediately, and check sugar.   Hold any diabetes meds today.    Monitor your sugars closely and record values atleast 4x/day.   Follow up with your doctor in the coming week.  Return to ER if worse, symptoms recur, weak/faint, fevers, chest pain, trouble breathing, or other concern.

## 2021-02-16 NOTE — ED Triage Notes (Addendum)
Pt found unrepsonsive on porch, snoring respirations initially, CBG 20. LKW 1130a, known diabetic, takes humalog x3 a day, can't confirm if he took it or not this morning. EMS reports giving d10 '25mg'$ , OJ, PB sandwich and pt's CBG increased to 213 and dropped to 90. Currently CBG 79. Pt "drenched" in sweat, current temp 92.19F rectal. Pt family reported to ems they administered glucagon which "usually works" but made no difference today.

## 2021-02-16 NOTE — ED Provider Notes (Signed)
[facility] EMERGENCY DEPARTMENT Provider Note   CSN: [id number] Arrival date & time: 02/16/21  1541     History Chief Complaint  Patient presents with  . Hypoglycemia    Louis Peters is a 55 y.o. male.  Patient with hx iddm, presents via EMS with low blood sugar and altered mental status. Patient had been seen earlier today, mowing yard, but then family oculdnt reach him, he was found in seated position, slumped over, and EMS was called - glucose 20. Pt indicates did not take his insulin today. EMS gave D10, and gave food/drink, with improvement of mental status. Recheck was 213, and then was 90. Pt unsure where recent sugars have been running. Indicates had eaten/drank relatively little today. No vomiting or diarrhea. Denies trauma, fall, or pain. No headache. No neck or back pain. No chest pain or discomfort. No sob. No abd pain. No dysuria or gu c/o. Denies change in meds or new meds.   The history is provided by the patient and the EMS personnel.  Hypoglycemia Associated symptoms: no anxiety, no shortness of breath and no vomiting        Past Medical History:  Diagnosis Date  . Benign hypertension with chronic kidney disease, stage III ([place]) 03/10/2019  . Chronic kidney disease (CKD), stage III (moderate) ([place]) 03/10/2019  . Diabetes mellitus type 1, uncomplicated ([place]) 12/08/1015  . Diabetes mellitus without complication ([place])   . Diabetic retinopathy ([place])    PDR OU  . Heat Injury 03/10/2019  . Hypercholesterolemia 03/10/2019  . Hypertension   . Retinal detachment    TRD OD    Patient Active Problem List   Diagnosis Date Noted  . Intractable nausea and vomiting 01/02/2021  . Slurred speech 07/26/2020  . Hypertension   . Hypertensive urgency   . Ischemic stroke ([place])   . History of amputation of lesser toe ([place]) 07/30/2019  . AKI (acute kidney injury) ([place]) 03/10/2019  . Heat Injury 03/10/2019  . Chronic kidney disease (CKD), stage III (moderate)  ([place]) 03/10/2019  . Benign hypertension with chronic kidney disease, stage III ([place]) 03/10/2019  . Hypercholesterolemia 03/10/2019  . Acute kidney injury superimposed on chronic kidney disease ([place])   . Diabetic infection of left foot ([place]) 08/10/2018  . Hyperlipidemia due to type 1 diabetes mellitus ([place]) 07/31/2018  . Combined forms of age-related cataract of both eyes 01/25/2017  . Proliferative diabetic retinopathy of left eye without macular edema associated with type 1 diabetes mellitus ([place]) 01/25/2017  . Vitreous hemorrhage of left eye ([place]) 01/25/2017  . Fungal dermatitis 02/18/2016  . Diabetes mellitus type 1, uncomplicated ([place]) 51/11/5850  . Edema 11/27/2012  . Proteinuria 09/30/2012  . Diabetic peripheral neuropathy ([place]) 05/02/2012  . Proliferative diabetic retinopathy ([place]) 03/24/2012    Past Surgical History:  Procedure Laterality Date  . AV FISTULA PLACEMENT Right 11/08/2020   Procedure: RIGHT ARM BRACHIOCEPHALIC ARTERIOVENOUS (AV) FISTULA CREATION;  Surgeon: Angelia Mould, MD;  Location: [facility];  Service: Vascular;  Laterality: Right;  . CATARACT EXTRACTION    . EYE SURGERY    . RETINAL DETACHMENT SURGERY         Family History  Problem Relation Age of Onset  . Diabetes Maternal Grandmother   . Diabetes Sister     Social History   Tobacco Use  . Smoking status: Never Smoker  . Smokeless tobacco: Never Used  Vaping Use  . Vaping Use: Never used  Substance Use Topics  . Alcohol use:  Yes    Comment: occ  . Drug use: No    Home Medications Prior to Admission medications   Medication Sig Start Date End Date Taking? Authorizing Provider  amLODipine (NORVASC) 10 MG tablet Take 10 mg by mouth every morning. 01/16/17   [provider]  ammonium lactate (LAC-HYDRIN) 12 % lotion Apply 1 application topically daily as needed for dry skin. 10/01/18   [provider]  aspirin EC 81 MG EC tablet Take 1 tablet (81 mg total) by mouth daily.  Swallow whole. Patient taking differently: Take 81 mg by mouth every morning. Swallow whole. 07/24/20   Geradine Girt, DO  atorvastatin (LIPITOR) 80 MG tablet Take 1 tablet (80 mg total) by mouth daily. Patient taking differently: Take 80 mg by mouth every morning. 07/24/20   Geradine Girt, DO  Blood Glucose Monitoring Suppl ([place]) w/Device KIT by Does not apply route. 07/31/18   [provider]  cholecalciferol (VITAMIN D3) 25 MCG (1000 UNIT) tablet Take 1,000 Units by mouth every morning.    [provider]  clopidogrel (PLAVIX) 75 MG tablet Take 75 mg by mouth every morning. 11/28/20   [provider]  Continuous Blood Gluc Receiver (FREESTYLE LIBRE 2 READER) DEVI by Does not apply route. 04/08/20   [provider]  furosemide (LASIX) 40 MG tablet Take 1 tablet (40 mg total) by mouth daily. 01/07/21 01/07/22  Mercy Riding, MD  Glucagon (BAQSIMI ONE PACK) 3 MG/DOSE POWD Place 3 mg into the nose once as needed (low blood sugar).    [provider]  hydrALAZINE (APRESOLINE) 100 MG tablet Take 100 mg by mouth 3 (three) times daily.    [provider]  insulin degludec (TRESIBA) 100 UNIT/ML SOPN FlexTouch Pen Inject 12 Units into the skin at bedtime. 04/30/19   [provider]  insulin lispro (HUMALOG KWIKPEN) 200 UNIT/ML KwikPen Inject 5-8 Units into the skin See admin instructions. Inject 5 units subcutaneously before breakfast and lunch, inject 8 units before supper    [provider]  Insulin Pen Needle 32G X 4 MM MISC Use with lantus and humalog 4 imes per day 07/26/17   [provider]  labetalol (NORMODYNE) 100 MG tablet Take 100 mg by mouth 2 (two) times daily with breakfast and lunch. 12/08/20   [provider]  losartan (COZAAR) 50 MG tablet Take 1 tablet (50 mg total) by mouth daily. 01/03/21 01/03/22  Mercy Riding, MD  metoCLOPramide (REGLAN) 5 MG tablet Take 1 tablet (5 mg total) by  mouth 3 (three) times daily before meals for 5 days, THEN 1 tablet (5 mg total) every 6 (six) hours as needed for up to 5 days for nausea. 01/03/21 01/13/21  Mercy Riding, MD  ONE TOUCH LANCETS MISC Use to check blood sugar 8 time(s) daily 07/31/18   [provider]  Roma Schanz test strip SMARTSIG:Via Meter 8 Times Daily 12/04/20   [provider]  oxyCODONE-acetaminophen (PERCOCET) 5-325 MG tablet Take 1 tablet by mouth every 4 (four) hours as needed for severe pain. Patient not taking: Reported on 01/02/2021 11/08/20 11/08/21  Karoline Caldwell, PA-C    Allergies    Fexofenadine and Latex  Review of Systems   Review of Systems  Constitutional: Negative for chills and fever.  HENT: Negative for sore throat.   Eyes: Negative for redness and visual disturbance.  Respiratory: Negative for cough and shortness of breath.   Cardiovascular: Negative for chest pain.  Gastrointestinal: Negative for abdominal pain, diarrhea and vomiting.  Genitourinary: Negative for flank pain.  Musculoskeletal: Negative for back pain, neck pain and neck stiffness.  Skin: Negative for rash.  Neurological: Negative for headaches.  Hematological: Does not bruise/bleed easily.  Psychiatric/Behavioral: The patient is not nervous/anxious.     Physical Exam Updated Vital Signs BP (!) 161/76   Pulse 76   Resp (!) 21   SpO2 100%   Physical Exam Vitals and nursing note reviewed.  Constitutional:      Appearance: Normal appearance. He is well-developed.  HENT:     Head: Atraumatic.     Nose: Nose normal.     Mouth/Throat:     Mouth: Mucous membranes are moist.     Pharynx: Oropharynx is clear.  Eyes:     General: No scleral icterus.    Conjunctiva/sclera: Conjunctivae normal.     Pupils: Pupils are equal, round, and reactive to light.  Neck:     Trachea: No tracheal deviation.     Comments: Thyroid not enlarged or tender. No bruits.  Cardiovascular:     Rate and Rhythm: Normal rate and  regular rhythm.     Pulses: Normal pulses.     Heart sounds: Normal heart sounds. No murmur heard. No friction rub. No gallop.   Pulmonary:     Effort: Pulmonary effort is normal. No accessory muscle usage or respiratory distress.     Breath sounds: Normal breath sounds.  Abdominal:     General: Bowel sounds are normal. There is no distension.     Palpations: Abdomen is soft. There is no mass.     Tenderness: There is no abdominal tenderness. There is no guarding or rebound.     Hernia: No hernia is present.  Genitourinary:    Comments: No cva tenderness. Musculoskeletal:        General: No swelling or tenderness.     Cervical back: Normal range of motion and neck supple. No rigidity.  Skin:    General: Skin is warm and dry.     Findings: No rash.  Neurological:     Mental Status: He is alert.     Comments: Alert, speech clear. GCS 15. Motor intact bil, stre 5/5. Sens grossly intact.   Psychiatric:        Mood and Affect: Mood normal.     ED Results / Procedures / Treatments   Labs (all labs ordered are listed, but only abnormal results are displayed) Results for orders placed or performed during the hospital encounter of 02/16/21  CBC  Result Value Ref Range   WBC 5.1 4.0 - 10.5 K/uL   RBC 4.10 (L) 4.22 - 5.81 MIL/uL   Hemoglobin 12.0 (L) 13.0 - 17.0 g/dL   HCT 37.8 (L) 39.0 - 52.0 %   MCV 92.2 80.0 - 100.0 fL   MCH 29.3 26.0 - 34.0 pg   MCHC 31.7 30.0 - 36.0 g/dL   RDW 14.3 11.5 - 15.5 %   Platelets 200 150 - 400 K/uL   nRBC 0.0 0.0 - 0.2 %  Comprehensive metabolic panel  Result Value Ref Range   Sodium 137 135 - 145 mmol/L   Potassium 4.2 3.5 - 5.1 mmol/L   Chloride 106 98 - 111 mmol/L   CO2 22 22 - 32 mmol/L   Glucose, Bld 93 70 - 99 mg/dL   BUN 57 (H) 6 - 20 mg/dL   Creatinine, Ser 4.72 (H) 0.61 - 1.24 mg/dL   Calcium 9.2  8.9 - 10.3 mg/dL   Total Protein 6.7 6.5 - 8.1 g/dL   Albumin 4.1 3.5 - 5.0 g/dL   AST 38 15 - 41 U/L   ALT 36 0 - 44 U/L   Alkaline  Phosphatase 63 38 - 126 U/L   Total Bilirubin 0.6 0.3 - 1.2 mg/dL   GFR, Estimated 14 (L) >60 mL/min   Anion gap 9 5 - 15  Ethanol  Result Value Ref Range   Alcohol, Ethyl (B) <10 <10 mg/dL  Rapid urine drug screen (hospital performed)  Result Value Ref Range   Opiates NONE DETECTED NONE DETECTED   Cocaine NONE DETECTED NONE DETECTED   Benzodiazepines NONE DETECTED NONE DETECTED   Amphetamines NONE DETECTED NONE DETECTED   Tetrahydrocannabinol NONE DETECTED NONE DETECTED   Barbiturates NONE DETECTED NONE DETECTED  Urinalysis, Routine w reflex microscopic Urine, Random  Result Value Ref Range   Color, Urine STRAW (A) YELLOW   APPearance CLEAR CLEAR   Specific Gravity, Urine 1.009 1.005 - 1.030   pH 5.0 5.0 - 8.0   Glucose, UA NEGATIVE NEGATIVE mg/dL   Hgb urine dipstick NEGATIVE NEGATIVE   Bilirubin Urine NEGATIVE NEGATIVE   Ketones, ur NEGATIVE NEGATIVE mg/dL   Protein, ur 100 (A) NEGATIVE mg/dL   Nitrite NEGATIVE NEGATIVE   Leukocytes,Ua NEGATIVE NEGATIVE   RBC / HPF 0-5 0 - 5 RBC/hpf   WBC, UA 0-5 0 - 5 WBC/hpf   Bacteria, UA NONE SEEN NONE SEEN   Squamous Epithelial / LPF 0-5 0 - 5   Mucus PRESENT   CBG monitoring, ED  Result Value Ref Range   Glucose-Capillary 79 70 - 99 mg/dL  POC CBG, ED  Result Value Ref Range   Glucose-Capillary 113 (H) 70 - 99 mg/dL  CBG monitoring, ED  Result Value Ref Range   Glucose-Capillary 136 (H) 70 - 99 mg/dL   DG Chest Port 1 View  Result Date: 02/16/2021 CLINICAL DATA:  Weakness, hypoglycemia. EXAM: PORTABLE CHEST 1 VIEW COMPARISON:  01/02/2021. FINDINGS: Trachea is midline. Heart size normal. Lungs are clear. No pleural fluid. IMPRESSION: No acute findings. Electronically Signed   By: Lorin Picket M.D.   On: 02/16/2021 16:38    EKG EKG Interpretation  Date/Time:  Wednesday Feb 16 2021 15:51:27 EDT Ventricular Rate:  75 PR Interval:  216 QRS Duration: 102 QT Interval:  402 QTC Calculation: 449 R Axis:   61 Text  Interpretation: Sinus rhythm Prolonged PR interval Confirmed by Lajean Saver [phone] on 02/16/2021 5:11:44 PM   Radiology CT HEAD WO CONTRAST  Result Date: 02/16/2021 CLINICAL DATA:  Altered mental status. EXAM: CT HEAD WITHOUT CONTRAST TECHNIQUE: Contiguous axial images were obtained from the base of the skull through the vertex without intravenous contrast. COMPARISON:  July 22, 2020 FINDINGS: Brain: There is mild cerebral atrophy with widening of the extra-axial spaces and ventricular dilatation. There are areas of decreased attenuation within the white matter tracts of the supratentorial brain, consistent with microvascular disease changes. Vascular: No hyperdense vessel or unexpected calcification. Skull: Normal. Negative for fracture or focal lesion. Sinuses/Orbits: Moderate to marked severity left ethmoid sinus and left-sided frontal sinus mucosal thickening is seen. Other: None. IMPRESSION: 1. Generalized cerebral atrophy. 2. No acute intracranial abnormality. 3. Left ethmoid sinus and left-sided frontal sinus disease. Electronically Signed   By: Virgina Norfolk M.D.   On: 02/16/2021 18:12   DG Chest Port 1 View  Result Date: 02/16/2021 CLINICAL DATA:  Weakness, hypoglycemia. EXAM: PORTABLE CHEST 1 VIEW  COMPARISON:  01/02/2021. FINDINGS: Trachea is midline. Heart size normal. Lungs are clear. No pleural fluid. IMPRESSION: No acute findings. Electronically Signed   By: Lorin Picket M.D.   On: 02/16/2021 16:38    Procedures Procedures   Medications Ordered in ED Medications - No data to display  ED Course  I have reviewed the triage vital signs and the nursing notes.  Pertinent labs & imaging results that were available during my care of the patient were reviewed by me and considered in my medical decision making (see chart for details).    MDM Rules/Calculators/A&P                         Iv ns. Continuous pulse ox and monitor.  Labs.   Reviewed nursing notes and prior  charts for additional history.   Labs reviewed/interpreted by me - chem normal.   CXR reviewed/interpreted by me - no pna.  CT reviewed/interpreted by me - no hem  Po fluids/food - encouraged to eat.   Recheck, pt fully awake and alert, oriented, denies any pain or other c/o currently.   Patient has eaten/drank, is asymptomatic. Vitals normal.  Pt currently appears stable for d/c.   Return precautions provided.      Final Clinical Impression(s) / ED Diagnoses Final diagnoses:  None    Rx / DC Orders ED Discharge Orders    None       Lajean Saver, MD 02/16/21 [phone]

## 2021-02-16 NOTE — ED Notes (Signed)
Pt states he walked to his porch and passed out. No light headedness or dizziness prior to fall. Pt states he started feeling bad around 1500. Clothing removed to eliminate evaporation heat loss. Pt placed under bair hugger upon rectal temp result.

## 2021-02-17 NOTE — Progress Notes (Signed)
[facility] Note  02/25/2021                        CHIEF COMPLAINT Patient presents for Retina Follow Up   HISTORY OF PRESENT ILLNESS: Louis Peters is a 55 y.o. male who presents to the clinic today for:   HPI    Retina Follow Up    Patient presents with  Diabetic Retinopathy.  In both eyes.  This started years ago.  Severity is moderate.  Duration of 3 months.  Since onset it is stable.  I, the attending physician,  performed the HPI with the patient and updated documentation appropriately.          Comments    55 y/o male pt here for 3 mo f/u for PDR OU.  No change in [place] OU.  Denies pain, FOL, floaters.  No gtts.  BS 174 this a.m.  Last A1C 7.5.       Last edited by [name], MD on 02/27/2021  2:36 AM. (History)    Pt reports [facility] OU is the same.  No changes in medical history.  Pt's wife reports recent hx of stroke and gastroparesis secondary to DM.  Referring physician: Bernerd Limbo, MD [address] [place] [place],  [place] [address]  HISTORICAL INFORMATION:   Selected notes from the MEDICAL RECORD NUMBER Referred by Dr. [name] for concern of retinal traction LEE: 09.25.19 (K. Hallahan) [BCVA: OD: CF_0  OS: 20/40] Ocular Hx-vitreous hemorrhage OS, traction detachment OD, PDR OU Previous eye docs: [name] Last visit w/ [name] was in 2016 -- was supposed to schedule surgery OS (PPV, MP, EL) but pt never followed up PMH-DM (last A1C: 11.1, takes humalog, lantus), HTN   CURRENT MEDICATIONS: No current outpatient medications on file. (Ophthalmic Drugs)   No current facility-administered medications for this visit. (Ophthalmic Drugs)   Current Outpatient Medications (Other)  Medication Sig  . amLODipine (NORVASC) 10 MG tablet Take 10 mg by mouth every morning.  Marland Kitchen ammonium lactate (LAC-HYDRIN) 12 % lotion Apply 1 application topically daily as needed for dry skin.  Marland Kitchen aspirin EC 81 MG EC tablet Take 1  tablet (81 mg total) by mouth daily. Swallow whole. (Patient taking differently: Take 81 mg by mouth every morning. Swallow whole.)  . atorvastatin (LIPITOR) 80 MG tablet Take 1 tablet (80 mg total) by mouth daily. (Patient taking differently: Take 80 mg by mouth every morning.)  . Blood Glucose Monitoring Suppl (ONETOUCH VERIO FLEX SYSTEM) w/Device KIT by Does not apply route.  . cholecalciferol (VITAMIN D3) 25 MCG (1000 UNIT) tablet Take 1,000 Units by mouth every morning.  . clopidogrel (PLAVIX) 75 MG tablet Take 75 mg by mouth every morning.  . Continuous Blood Gluc Receiver (FREESTYLE LIBRE 2 READER) DEVI by Does not apply route.  . furosemide (LASIX) 40 MG tablet Take 1 tablet (40 mg total) by mouth daily.  . Glucagon (BAQSIMI ONE PACK) 3 MG/DOSE POWD Place 3 mg into the nose once as needed (low blood sugar).  . hydrALAZINE (APRESOLINE) 100 MG tablet Take 100 mg by mouth 3 (three) times daily.  . insulin degludec (TRESIBA) 100 UNIT/ML SOPN FlexTouch Pen Inject 12 Units into the skin at bedtime.  . insulin lispro (HUMALOG KWIKPEN) 200 UNIT/ML KwikPen Inject 5-8 Units into the skin See admin instructions. Inject 5 units subcutaneously before breakfast and lunch, inject 8 units before supper  . Insulin Pen  Needle 32G X 4 MM MISC Use with lantus and humalog 4 imes per day  . labetalol (NORMODYNE) 100 MG tablet Take 100 mg by mouth 2 (two) times daily with breakfast and lunch.  . losartan (COZAAR) 100 MG tablet Take 1 tablet by mouth daily.  Marland Kitchen losartan (COZAAR) 50 MG tablet Take 1 tablet (50 mg total) by mouth daily.  . ONE TOUCH LANCETS MISC Use to check blood sugar 8 time(s) daily  . ONETOUCH VERIO test strip SMARTSIG:Via Meter 8 Times Daily  . oxyCODONE-acetaminophen (PERCOCET) 5-325 MG tablet Take 1 tablet by mouth every 4 (four) hours as needed for severe pain.  Marland Kitchen triamcinolone cream (KENALOG) 0.1 % Apply topically 2 (two) times daily.  . metoCLOPramide (REGLAN) 5 MG tablet Take 1 tablet (5  mg total) by mouth 3 (three) times daily before meals for 5 days, THEN 1 tablet (5 mg total) every 6 (six) hours as needed for up to 5 days for nausea.   No current facility-administered medications for this visit. (Other)      REVIEW OF SYSTEMS: ROS    Positive for: Neurological, Genitourinary, Endocrine, Eyes   Negative for: Constitutional, Gastrointestinal, Skin, Musculoskeletal, HENT, Cardiovascular, Respiratory, Psychiatric, Allergic/Imm, Heme/Lymph   Last edited by Matthew Folks, COA on 02/25/2021  8:56 AM. (History)       ALLERGIES Allergies  Allergen Reactions  . Fexofenadine Rash  . Latex Rash    PAST MEDICAL HISTORY Past Medical History:  Diagnosis Date  . Benign hypertension with chronic kidney disease, stage III ([place]) 03/10/2019  . Chronic kidney disease (CKD), stage III (moderate) ([place]) 03/10/2019  . Diabetes mellitus type 1, uncomplicated ([place]) 11/08/7410  . Diabetes mellitus without complication ([place])   . Diabetic retinopathy ([place])    PDR OU  . Heat Injury 03/10/2019  . Hypercholesterolemia 03/10/2019  . Hypertension   . Retinal detachment    TRD OD   Past Surgical History:  Procedure Laterality Date  . AV FISTULA PLACEMENT Right 11/08/2020   Procedure: RIGHT ARM BRACHIOCEPHALIC ARTERIOVENOUS (AV) FISTULA CREATION;  Surgeon: Angelia Mould, MD;  Location: [facility];  Service: Vascular;  Laterality: Right;  . CATARACT EXTRACTION    . EYE SURGERY    . RETINAL DETACHMENT SURGERY      FAMILY HISTORY Family History  Problem Relation Age of Onset  . Diabetes Maternal Grandmother   . Diabetes Sister     SOCIAL HISTORY Social History   Tobacco Use  . Smoking status: Never Smoker  . Smokeless tobacco: Never Used  Vaping Use  . Vaping Use: Never used  Substance Use Topics  . Alcohol use: Yes    Comment: occ  . Drug use: No         OPHTHALMIC EXAM:  Base Eye Exam    Visual Acuity (Snellen - Linear)      Right Left   Dist cc 20/400 20/25 -2    Dist ph cc NI NI   Correction: Glasses       Tonometry (Tonopen, 8:58 AM)      Right Left   Pressure 11 8       Pupils      Dark Light Shape React APD   Right 4 3 Round Brisk None   Left 4 3 Round Brisk None       Visual Fields (Counting fingers)      Left Right    Full    Restrictions  Partial outer superior temporal deficiency  Neuro/Psych    Oriented x3: Yes   Mood/Affect: Normal       Dilation    Both eyes: 1.0% Mydriacyl, 2.5% Phenylephrine @ 8:58 AM        Slit Lamp and Fundus Exam    Slit Lamp Exam      Right Left   Lids/Lashes Dermatochalasis - upper lid, Meibomian gland dysfunction Dermatochalasis - upper lid, Meibomian gland dysfunction   Conjunctiva/Sclera Melanosis Melanosis   Cornea Arcus Arcus   Anterior Chamber Deep and quiet Deep and quiet   Iris Round and dilated, No NVI Round and dilated, No NVI   Lens 2-3+ Nuclear sclerosis, 2-3+ Cortical cataract 2-3+ Nuclear sclerosis, 3+ Cortical cataract   Vitreous Vitreous syneresis VH improved centrally, boat shaped heme below inferior arcade improved, mild blood clots inferiorly       Fundus Exam      Right Left   Disc 1-2+ Pallor, fibrosis eminating to ST arcade, Sharp rim sharp rim, mild pallor, mild PPA / PPP   C/D Ratio 0.0 0.4   Macula TRD with vertical fibrotic band extending from ST to IT arcades, tractional fibrosis -- stable from prior Flat, blunted foveal reflex, +edema superior macula, Retinal pigment epithelial mottling   Vessels Severe Vascular attenuation, sclerotic arterioles, +fibrotic NV along temporal arcades - inactive Vascular attenuation, early arteriolar sclerosis, fibrosis along temporal arcades, fibrotic NVE along distal IT arcade -- regressing   Periphery Attached peripherally with 360 PRP laser, No heme  Attached, tractional retinoschisis along superior arcades; 360 PRP with good posterior fill in superiorly towards schisis, room for fill in           IMAGING AND  PROCEDURES  Imaging and Procedures for _0 @  OCT, Retina - OU - Both Eyes       Right Eye Quality was good. Central Foveal Thickness: 225 (Unable to obtain). Progression has been stable. 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   Left Eye Quality was good. Central Foveal Thickness: 212. Progression has been stable. Findings include abnormal foveal contour, no SRF, intraretinal fluid, vitreous traction (Persistent, stable tractional schisis superiorly).   Notes *Images captured and stored on drive  Diagnosis / Impression:  OD: chronic TRD, +SRF OS: Persistent, stable tractional schisis superiorly   Clinical management:  See below  Abbreviations: NFP - Normal foveal profile. CME - cystoid macular edema. PED - pigment epithelial detachment. IRF - intraretinal fluid. SRF - subretinal fluid. EZ - ellipsoid zone. ERM - epiretinal membrane. ORA - outer retinal atrophy. ORT - outer retinal tubulation. SRHM - subretinal hyper-reflective material                  ASSESSMENT/PLAN:    ICD-10-CM   1. 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 Retinal edema  H35.81 OCT, Retina - OU - Both Eyes  3. Right eye affected by proliferative diabetic retinopathy with traction retinal detachment involving macula, associated with type 2 diabetes mellitus ([place])  E28.0034   4. Essential hypertension  I10   5. Hypertensive retinopathy of both eyes  H35.033   6. Combined forms of age-related cataract of both eyes  H25.813     1,2. Proliferative diabetic retinopathy OU  - pt lost to f/u here from 02.12.2020 to 01.27.22  - OD -- chronic, macula-involving TRD -- 20/400 vision --  severe retinal ischemia  - OS -- vitreous traction with macular edema /  retinoschisis of superior macula -- BCVA 20/25-2  - former pt of Dr. Zadie Rhine in 2016 -- records reviewed, at that time, OD was already detached with VA 20/400, OS was to undergo PPV/MP/EL, but pt never had the surgery or followed up with [name]  - history of new onset vitreous hemorrhage following previous laser  - S/P PRP fill-in OS (10.01.19), fill-in (01.13.20)  - FA (01.13.20) shows active NVE OS, OD with minimal leakage   - VA 20/25 OS -- stable  - OCT shows chronic mac off TRD OD, and OS with tractional retinoschisis / macular edema of superior macula -- stable improvement compared to 2020 study  - repeat FA 02.25.22 shows interval improvement in NVE OS -- minimal leakage along tractional schisis superior macula OS  - discussed possible need for surgery in the future  - OS may eventually need PPV / MP to relieve tractional retinoschisis, but with VA 20/25, will monitor for now  - F/U 3 months, sooner prn -- DFE, OCT  3. Chronic TRD OD-   - fovea involving chronic TRD OD -- periphery attached by laser PRP  - severe ischemia / arteriolar sclerosis OD -- low vision potential  - review of records for [name] indicate mac off TRD has been present since early 2016 -- now 3+ years  - discussed findings and poor prognosis  - do not recommend surgical intervention OD -- high risk, low benefit  - pt not interested in surgical intervention at this time  - monitor  4,5. Hypertensive retinopathy OU  - discussed importance of tight BP control  - monitor  6. Combined form age related cataracts OU-  - The symptoms of cataract, surgical options, and treatments and risks were discussed with patient.  - discussed diagnosis and progression  - not yet visually significant  - monitor    Ophthalmic Meds Ordered this visit:  No orders of the defined types were placed in this encounter.      Return in about 3 months (around 05/28/2021) for 3 mo f/u for PDR OU w/DFE&OCT.  There are no Patient  Instructions on file for this visit.   Explained the diagnoses, plan, and follow up with the patient and they expressed understanding.  Patient expressed understanding of the importance of proper follow up care.   This document serves as a record of services personally performed by Gardiner Sleeper, MD, PhD. It was created on their behalf by Leonie Douglas, an ophthalmic technician. The creation of this record is the provider's dictation and/or activities during the visit.    Electronically signed by: Leonie Douglas COA, 02/27/21  2:41 AM   This document serves as a record of services personally performed by Gardiner Sleeper, MD, PhD. It was created on their behalf by San Jetty. Owens Shark, OA an ophthalmic technician. The creation of this record is the provider's dictation and/or activities during the visit.    Electronically signed by: San Jetty. Owens Shark, [place] 05.27.2022 2:41 AM  This document serves as a record of services personally performed by Gardiner Sleeper, MD, PhD. It was created on their behalf by Estill Bakes, COT an ophthalmic technician. The creation of this record is the provider's dictation and/or activities during the visit.    Electronically signed by: Estill Bakes, COT 5.27.22 @ 2:41 AM  Gardiner Sleeper, M.D., Ph.D. Diseases & Surgery of the Retina and Vitreous [facility] 02/25/2021   I have reviewed the above documentation for accuracy and completeness, and I agree with the above. Gardiner Sleeper, M.D., Ph.D. 02/27/21 2:41 AM   Abbreviations: M myopia (nearsighted); A astigmatism; H hyperopia (farsighted); P presbyopia; Mrx spectacle prescription;  CTL contact lenses; OD right eye; OS left eye; OU both eyes  XT exotropia; ET esotropia; PEK punctate epithelial keratitis; PEE punctate epithelial erosions; DES dry eye syndrome; MGD meibomian gland dysfunction; ATs artificial tears; PFAT's preservative free artificial tears; [place] nuclear sclerotic cataract; PSC  posterior subcapsular cataract; ERM epi-retinal membrane; PVD posterior vitreous detachment; RD retinal detachment; DM diabetes mellitus; DR diabetic retinopathy; NPDR non-proliferative diabetic retinopathy; PDR proliferative diabetic retinopathy; CSME clinically significant macular edema; DME diabetic macular edema; dbh dot blot hemorrhages; CWS cotton wool spot; POAG primary open angle glaucoma; C/D cup-to-disc ratio; HVF humphrey visual field; GVF goldmann visual field; OCT optical coherence tomography; IOP intraocular pressure; BRVO Branch retinal vein occlusion; CRVO central retinal vein occlusion; CRAO central retinal artery occlusion; BRAO branch retinal artery occlusion; RT retinal tear; SB scleral buckle; PPV pars plana vitrectomy; VH Vitreous hemorrhage; PRP panretinal laser photocoagulation; IVK intravitreal kenalog; VMT vitreomacular traction; MH Macular hole;  NVD neovascularization of the disc; NVE neovascularization elsewhere; AREDS age related eye disease study; ARMD age related macular degeneration; POAG primary open angle glaucoma; EBMD epithelial/anterior basement membrane dystrophy; ACIOL anterior chamber intraocular lens; IOL intraocular lens; PCIOL posterior chamber intraocular lens; Phaco/IOL phacoemulsification with intraocular lens placement; [place] photorefractive keratectomy; LASIK laser assisted in situ keratomileusis; HTN hypertension; DM diabetes mellitus; COPD chronic obstructive pulmonary disease

## 2021-02-25 ENCOUNTER — Encounter (INDEPENDENT_AMBULATORY_CARE_PROVIDER_SITE_OTHER): Payer: Self-pay | Admitting: Ophthalmology

## 2021-02-25 ENCOUNTER — Other Ambulatory Visit: Payer: Self-pay

## 2021-02-25 ENCOUNTER — Ambulatory Visit (INDEPENDENT_AMBULATORY_CARE_PROVIDER_SITE_OTHER): Payer: Medicare Other | Admitting: Ophthalmology

## 2021-02-25 DIAGNOSIS — H25813 Combined forms of age-related cataract, bilateral: Secondary | ICD-10-CM

## 2021-02-25 DIAGNOSIS — E113521 Type 2 diabetes mellitus with proliferative diabetic retinopathy with traction retinal detachment involving the macula, right eye: Secondary | ICD-10-CM | POA: Diagnosis not present

## 2021-02-25 DIAGNOSIS — H3581 Retinal edema: Secondary | ICD-10-CM

## 2021-02-25 DIAGNOSIS — E113512 Type 2 diabetes mellitus with proliferative diabetic retinopathy with macular edema, left eye: Secondary | ICD-10-CM | POA: Diagnosis not present

## 2021-02-25 DIAGNOSIS — H35033 Hypertensive retinopathy, bilateral: Secondary | ICD-10-CM

## 2021-02-25 DIAGNOSIS — I10 Essential (primary) hypertension: Secondary | ICD-10-CM

## 2021-02-27 ENCOUNTER — Encounter (INDEPENDENT_AMBULATORY_CARE_PROVIDER_SITE_OTHER): Payer: Self-pay | Admitting: Ophthalmology

## 2021-03-01 ENCOUNTER — Other Ambulatory Visit (HOSPITAL_COMMUNITY): Payer: Self-pay | Admitting: *Deleted

## 2021-03-02 ENCOUNTER — Ambulatory Visit (HOSPITAL_COMMUNITY)
Admission: RE | Admit: 2021-03-02 | Discharge: 2021-03-02 | Disposition: A | Discharge status: Home / Self Care | Payer: Medicare Other | Source: Ambulatory Visit | Attending: Nephrology | Admitting: Nephrology

## 2021-03-02 ENCOUNTER — Other Ambulatory Visit: Payer: Self-pay

## 2021-03-02 VITALS — BP 116/59 | HR 66 | Temp 97.8°F | Resp 18

## 2021-03-02 DIAGNOSIS — N1832 Chronic kidney disease, stage 3b: Secondary | ICD-10-CM | POA: Insufficient documentation

## 2021-03-02 LAB — POCT HEMOGLOBIN-HEMACUE: Hemoglobin: 11.6 g/dL — ABNORMAL LOW (ref 13.0–17.0)

## 2021-03-02 MED ORDER — EPOETIN ALFA-EPBX 10000 UNIT/ML IJ SOLN
INTRAMUSCULAR | Status: AC
  Filled 2021-03-02: qty 1

## 2021-03-02 MED ORDER — SODIUM CHLORIDE 0.9 % IV SOLN
510.0000 mg | Freq: Once | INTRAVENOUS | Status: AC
  Administered 2021-03-02: 510 mg via INTRAVENOUS
  Filled 2021-03-02: qty 510

## 2021-03-02 MED ORDER — EPOETIN ALFA-EPBX 10000 UNIT/ML IJ SOLN
10000.0000 [IU] | INTRAMUSCULAR | Status: DC
  Administered 2021-03-02: 10000 [IU] via SUBCUTANEOUS

## 2021-03-16 ENCOUNTER — Encounter (HOSPITAL_COMMUNITY): Payer: Medicare Other

## 2021-03-20 ENCOUNTER — Emergency Department (HOSPITAL_COMMUNITY): Payer: Medicare Other

## 2021-03-20 ENCOUNTER — Other Ambulatory Visit: Payer: Self-pay

## 2021-03-20 ENCOUNTER — Emergency Department (HOSPITAL_COMMUNITY)
Admission: EM | Admit: 2021-03-20 | Discharge: 2021-03-20 | Disposition: A | Discharge status: Home / Self Care | Payer: Medicare Other | Attending: Emergency Medicine | Admitting: Emergency Medicine

## 2021-03-20 ENCOUNTER — Encounter (HOSPITAL_COMMUNITY): Payer: Self-pay

## 2021-03-20 DIAGNOSIS — Z91040 Latex allergy status: Secondary | ICD-10-CM | POA: Diagnosis not present

## 2021-03-20 DIAGNOSIS — Z7982 Long term (current) use of aspirin: Secondary | ICD-10-CM | POA: Diagnosis not present

## 2021-03-20 DIAGNOSIS — E103592 Type 1 diabetes mellitus with proliferative diabetic retinopathy without macular edema, left eye: Secondary | ICD-10-CM | POA: Diagnosis not present

## 2021-03-20 DIAGNOSIS — E1022 Type 1 diabetes mellitus with diabetic chronic kidney disease: Secondary | ICD-10-CM | POA: Insufficient documentation

## 2021-03-20 DIAGNOSIS — R1110 Vomiting, unspecified: Secondary | ICD-10-CM | POA: Insufficient documentation

## 2021-03-20 DIAGNOSIS — Z79899 Other long term (current) drug therapy: Secondary | ICD-10-CM | POA: Insufficient documentation

## 2021-03-20 DIAGNOSIS — R630 Anorexia: Secondary | ICD-10-CM | POA: Diagnosis not present

## 2021-03-20 DIAGNOSIS — I129 Hypertensive chronic kidney disease with stage 1 through stage 4 chronic kidney disease, or unspecified chronic kidney disease: Secondary | ICD-10-CM | POA: Diagnosis not present

## 2021-03-20 DIAGNOSIS — E1040 Type 1 diabetes mellitus with diabetic neuropathy, unspecified: Secondary | ICD-10-CM | POA: Insufficient documentation

## 2021-03-20 DIAGNOSIS — Z794 Long term (current) use of insulin: Secondary | ICD-10-CM | POA: Diagnosis not present

## 2021-03-20 DIAGNOSIS — R55 Syncope and collapse: Secondary | ICD-10-CM | POA: Insufficient documentation

## 2021-03-20 DIAGNOSIS — N183 Chronic kidney disease, stage 3 unspecified: Secondary | ICD-10-CM | POA: Diagnosis not present

## 2021-03-20 DIAGNOSIS — E162 Hypoglycemia, unspecified: Secondary | ICD-10-CM

## 2021-03-20 LAB — CBC WITH DIFFERENTIAL/PLATELET
Abs Immature Granulocytes: 0.01 10*3/uL (ref 0.00–0.07)
Basophils Absolute: 0 10*3/uL (ref 0.0–0.1)
Basophils Relative: 1 %
Eosinophils Absolute: 0.2 10*3/uL (ref 0.0–0.5)
Eosinophils Relative: 2 %
HCT: 40.4 % (ref 39.0–52.0)
Hemoglobin: 12.9 g/dL — ABNORMAL LOW (ref 13.0–17.0)
Immature Granulocytes: 0 %
Lymphocytes Relative: 20 %
Lymphs Abs: 1.5 10*3/uL (ref 0.7–4.0)
MCH: 29.2 pg (ref 26.0–34.0)
MCHC: 31.9 g/dL (ref 30.0–36.0)
MCV: 91.4 fL (ref 80.0–100.0)
Monocytes Absolute: 0.6 10*3/uL (ref 0.1–1.0)
Monocytes Relative: 8 %
Neutro Abs: 5 10*3/uL (ref 1.7–7.7)
Neutrophils Relative %: 69 %
Platelets: 207 10*3/uL (ref 150–400)
RBC: 4.42 MIL/uL (ref 4.22–5.81)
RDW: 13.2 % (ref 11.5–15.5)
WBC: 7.3 10*3/uL (ref 4.0–10.5)
nRBC: 0 % (ref 0.0–0.2)

## 2021-03-20 LAB — COMPREHENSIVE METABOLIC PANEL
ALT: 39 U/L (ref 0–44)
AST: 31 U/L (ref 15–41)
Albumin: 4.3 g/dL (ref 3.5–5.0)
Alkaline Phosphatase: 71 U/L (ref 38–126)
Anion gap: 9 (ref 5–15)
BUN: 76 mg/dL — ABNORMAL HIGH (ref 6–20)
CO2: 23 mmol/L (ref 22–32)
Calcium: 9.6 mg/dL (ref 8.9–10.3)
Chloride: 108 mmol/L (ref 98–111)
Creatinine, Ser: 5.15 mg/dL — ABNORMAL HIGH (ref 0.61–1.24)
GFR, Estimated: 12 mL/min — ABNORMAL LOW (ref >60)
Glucose, Bld: 104 mg/dL — ABNORMAL HIGH (ref 70–99)
Potassium: 3.9 mmol/L (ref 3.5–5.1)
Sodium: 140 mmol/L (ref 135–145)
Total Bilirubin: 0.7 mg/dL (ref 0.3–1.2)
Total Protein: 7.4 g/dL (ref 6.5–8.1)

## 2021-03-20 LAB — URINALYSIS, ROUTINE W REFLEX MICROSCOPIC
Bacteria, UA: NONE SEEN
Bilirubin Urine: NEGATIVE
Glucose, UA: >=500 mg/dL — AB
Ketones, ur: NEGATIVE mg/dL
Leukocytes,Ua: NEGATIVE
Nitrite: NEGATIVE
Protein, ur: 100 mg/dL — AB
Specific Gravity, Urine: 1.011 (ref 1.005–1.030)
pH: 5 (ref 5.0–8.0)

## 2021-03-20 LAB — CBG MONITORING, ED
Glucose-Capillary: 107 mg/dL — ABNORMAL HIGH (ref 70–99)
Glucose-Capillary: 22 mg/dL — CL (ref 70–99)
Glucose-Capillary: 57 mg/dL — ABNORMAL LOW (ref 70–99)
Glucose-Capillary: 98 mg/dL (ref 70–99)

## 2021-03-20 LAB — LIPASE, BLOOD: Lipase: 47 U/L (ref 11–51)

## 2021-03-20 MED ORDER — SODIUM CHLORIDE 0.9 % IV BOLUS
1000.0000 mL | Freq: Once | INTRAVENOUS | Status: AC
  Administered 2021-03-20: 1000 mL via INTRAVENOUS

## 2021-03-20 NOTE — Discharge Instructions (Addendum)
Please make sure you are eating breakfast and checking your sugars.  Please make sure you are drinking plenty of water.  Please follow up with your primary care doctor and your endocrinologist. If you pass out again, you develop any chest pain, shortness of breath or have any new or concerning symptoms please seek additional medical care and evaluation.

## 2021-03-20 NOTE — ED Notes (Signed)
Patient transported to X-ray 

## 2021-03-20 NOTE — ED Provider Notes (Signed)
I assumed care of patient at shift change from previous team, please see their note for full H&P Briefly patient is here for evaluation of a syncopal event.  Plan is to follow-up on repeat orthostatics after IV fluids as he was significantly orthostatic earlier along with UA and EKG. Patient became hypoglycemic while in the emergency room.  I suspect that this was related to him not eating as he is an insulin-dependent diabetic. He was able to take p.o. intake to adequately raise that sugar and given complex carbs.  Clinical Course as of 03/21/21 0018  Sun Mar 20, 2021  1703 Saw the patient CBG is 22. I went and spoke with patient.  He is awake and alert.  He is able to answer questions and not obtunded or lethargic.  He was given juice by RN.  Discussed plan with RN to recheck sugar in 15 minutes after juice.  If he has normalized his sugar will give him complex carbs and another liter of fluid. If he is still hyperglycemic will give IV dextrose. [EH]  1737 Glucose-Capillary(!): 57 Patient CBG has improved from 22 up to 57.  He remains awake and alert.  He is given sandwich, additional apple juice and applesauce with extra sugar added. [EH]    Clinical Course User Index [EH] Lorin Glass, PA-C    Procedures  Orthostatic VS for the past 24 hrs:  BP- Lying Pulse- Lying BP- Sitting Pulse- Sitting BP- Standing at 0 minutes Pulse- Standing at 0 minutes  03/20/21 1929 178/83 79 171/86 80 154/75 94  03/20/21 1429 154/77 69 140/74 68 (!) 81/53 76      MDM   Needs fluids, UA, Repeat orthostatics and EKG.  EKG without ischemia.  He had significant improvement on his orthostatics after 2 L of IV fluids.  He felt well and stated he was ready to go home.  Patient will be discharged according to plan established by previous team.  Patient tells me that he did not eat anything prior to his syncopal event and had not had any water which I suspect was a contributing factor.   ,Note:  Portions of this report may have been transcribed using voice recognition software. Every effort was made to ensure accuracy; however, inadvertent computerized transcription errors may be present        Ollen Gross 03/21/21 0020    Wyvonnia Dusky, MD 03/21/21 1007

## 2021-03-20 NOTE — ED Provider Notes (Signed)
[facility] DEPT Provider Note   CSN: [id number] Arrival date & time: 03/20/21  1331     History Chief Complaint  Patient presents with   Loss of Consciousness    Louis Peters is a 55 y.o. male presenting for evaluation of syncope.   Patient states he was sitting in the car waiting for stable to be available for breakfast and he stood up, and had a syncopal event.  Did not feel ill or poor prior to the syncopal event.  After syncope, patient felt sick to his stomach and vomited x2.  He is now symptom-free.  Patient states he had 1 episode of vomiting yesterday.  Per wife, patient has decreased p.o. intake due to early satiety, this is thought to be due to gastroparesis.  Patient denies recent fever, cough, chest pain, abdominal pain, change in urination or bowel output. Patient denies injury from the fall other than a scrape on his right elbow  Initial history taken chart reviewed.  Patient with a history of CKD.  He has AV fistula, but is not currently on dialysis.  History of diabetes, hypertension, cva.   HPI     Past Medical History:  Diagnosis Date   Benign hypertension with chronic kidney disease, stage III ([place]) 03/10/2019   Chronic kidney disease (CKD), stage III (moderate) ([place]) 03/10/2019   Diabetes mellitus type 1, uncomplicated ([place]) 5/0/2774   Diabetes mellitus without complication ([place])    Diabetic retinopathy ([place])    PDR OU   Heat Injury 03/10/2019   Hypercholesterolemia 03/10/2019   Hypertension    Retinal detachment    TRD OD    Patient Active Problem List   Diagnosis Date Noted   Intractable nausea and vomiting 01/02/2021   Slurred speech 07/26/2020   Hypertension    Hypertensive urgency    Ischemic stroke ([place])    History of amputation of lesser toe ([place]) 07/30/2019   AKI (acute kidney injury) ([place]) 03/10/2019   Heat Injury 03/10/2019   Chronic kidney disease (CKD), stage III (moderate) ([place]) 03/10/2019   Benign  hypertension with chronic kidney disease, stage III ([place]) 03/10/2019   Hypercholesterolemia 03/10/2019   Acute kidney injury superimposed on chronic kidney disease ([place])    Diabetic infection of left foot ([place]) 08/10/2018   Hyperlipidemia due to type 1 diabetes mellitus ([place]) 07/31/2018   Combined forms of age-related cataract of both eyes 01/25/2017   Proliferative diabetic retinopathy of left eye without macular edema associated with type 1 diabetes mellitus ([place]) 01/25/2017   Vitreous hemorrhage of left eye ([place]) 01/25/2017   Fungal dermatitis 02/18/2016   Diabetes mellitus type 1, uncomplicated ([place]) 12/87/8676   Edema 11/27/2012   Proteinuria 09/30/2012   Diabetic peripheral neuropathy ([place]) 05/02/2012   Proliferative diabetic retinopathy ([place]) 03/24/2012    Past Surgical History:  Procedure Laterality Date   AV FISTULA PLACEMENT Right 11/08/2020   Procedure: RIGHT ARM BRACHIOCEPHALIC ARTERIOVENOUS (AV) FISTULA CREATION;  Surgeon: Angelia Mould, MD;  Location: [facility] OR;  Service: Vascular;  Laterality: Right;   CATARACT EXTRACTION     EYE SURGERY     RETINAL DETACHMENT SURGERY         Family History  Problem Relation Age of Onset   Diabetes Maternal Grandmother    Diabetes Sister     Social History   Tobacco Use   Smoking status: Never   Smokeless tobacco: Never  Vaping Use   Vaping Use: Never used  Substance Use Topics   Alcohol  use: Yes    Comment: occ   Drug use: No    Home Medications Prior to Admission medications   Medication Sig Start Date End Date Taking? Authorizing Provider  amLODipine (NORVASC) 10 MG tablet Take 10 mg by mouth every morning. 01/16/17   [provider]  ammonium lactate (LAC-HYDRIN) 12 % lotion Apply 1 application topically daily as needed for dry skin. 10/01/18   [provider]  aspirin EC 81 MG EC tablet Take 1 tablet (81 mg total) by mouth daily. Swallow whole. Patient taking differently: Take 81 mg by mouth  every morning. Swallow whole. 07/24/20   Geradine Girt, DO  atorvastatin (LIPITOR) 80 MG tablet Take 1 tablet (80 mg total) by mouth daily. Patient taking differently: Take 80 mg by mouth every morning. 07/24/20   Geradine Girt, DO  Blood Glucose Monitoring Suppl ([place]) w/Device KIT by Does not apply route. 07/31/18   [provider]  cholecalciferol (VITAMIN D3) 25 MCG (1000 UNIT) tablet Take 1,000 Units by mouth every morning.    [provider]  clopidogrel (PLAVIX) 75 MG tablet Take 75 mg by mouth every morning. 11/28/20   [provider]  Continuous Blood Gluc Receiver (FREESTYLE LIBRE 2 READER) DEVI by Does not apply route. 04/08/20   [provider]  furosemide (LASIX) 40 MG tablet Take 1 tablet (40 mg total) by mouth daily. 01/07/21 01/07/22  Mercy Riding, MD  Glucagon (BAQSIMI ONE PACK) 3 MG/DOSE POWD Place 3 mg into the nose once as needed (low blood sugar).    [provider]  hydrALAZINE (APRESOLINE) 100 MG tablet Take 100 mg by mouth 3 (three) times daily.    [provider]  hydrochlorothiazide (HYDRODIURIL) 25 MG tablet Take 25 mg by mouth daily. 03/08/21   [provider]  insulin degludec (TRESIBA) 100 UNIT/ML SOPN FlexTouch Pen Inject 12 Units into the skin at bedtime. 04/30/19   [provider]  insulin lispro (HUMALOG KWIKPEN) 200 UNIT/ML KwikPen Inject 5-8 Units into the skin See admin instructions. Inject 5 units subcutaneously before breakfast and lunch, inject 8 units before supper    [provider]  Insulin Pen Needle 32G X 4 MM MISC Use with lantus and humalog 4 imes per day 07/26/17   [provider]  labetalol (NORMODYNE) 100 MG tablet Take 100 mg by mouth 2 (two) times daily with breakfast and lunch. 12/08/20   [provider]  losartan (COZAAR) 100 MG tablet Take 1 tablet by mouth daily. 01/28/21   [provider]  losartan (COZAAR) 50 MG tablet Take  1 tablet (50 mg total) by mouth daily. 01/03/21 01/03/22  Mercy Riding, MD  metoCLOPramide (REGLAN) 5 MG tablet Take 1 tablet (5 mg total) by mouth 3 (three) times daily before meals for 5 days, THEN 1 tablet (5 mg total) every 6 (six) hours as needed for up to 5 days for nausea. 01/03/21 01/13/21  Mercy Riding, MD  ONE TOUCH LANCETS MISC Use to check blood sugar 8 time(s) daily 07/31/18   [provider]  Roma Schanz test strip SMARTSIG:Via Meter 8 Times Daily 12/04/20   [provider]  oxyCODONE-acetaminophen (PERCOCET) 5-325 MG tablet Take 1 tablet by mouth every 4 (four) hours as needed for severe pain. 11/08/20 11/08/21  Baglia, Corrina, PA-C  triamcinolone cream (KENALOG) 0.1 % Apply topically 2 (two) times daily. 02/23/21   [provider]    Allergies    Fexofenadine  and Latex  Review of Systems   Review of Systems  Constitutional:  Positive for appetite change.  Gastrointestinal:  Positive for nausea and vomiting.  Neurological:  Positive for syncope.  Hematological:  Bruises/bleeds easily (on plavix).  All other systems reviewed and are negative.  Physical Exam Updated Vital Signs BP 135/65 (BP Location: Left Arm)   Pulse 71   Temp 98.2 F (36.8 C) (Oral)   Resp 18   Ht 5' 10" (1.778 m)   Wt 76.6 kg   SpO2 100%   BMI 24.23 kg/m   Physical Exam Vitals and nursing note reviewed.  Constitutional:      General: He is not in acute distress.    Appearance: Normal appearance.     Comments: nontoxic  HENT:     Head: Normocephalic and atraumatic.     Comments: No signs of head trauma Eyes:     Conjunctiva/sclera: Conjunctivae normal.     Pupils: Pupils are equal, round, and reactive to light.  Neck:     Comments: No ttp of midline c-spine Cardiovascular:     Rate and Rhythm: Normal rate and regular rhythm.     Pulses: Normal pulses.  Pulmonary:     Effort: Pulmonary effort is normal. No respiratory distress.     Breath sounds: Normal breath  sounds. No wheezing.     Comments: Speaking in full sentences.  Clear lung sounds in all fields. Abdominal:     General: There is no distension.     Palpations: Abdomen is soft. There is no mass.     Tenderness: There is no abdominal tenderness. There is no guarding or rebound.     Comments: No ttp of the abd  Musculoskeletal:        General: Normal range of motion.     Cervical back: Normal range of motion and neck supple.  Skin:    General: Skin is warm and dry.     Capillary Refill: Capillary refill takes less than 2 seconds.  Neurological:     Mental Status: He is alert and oriented to person, place, and time.  Psychiatric:        Mood and Affect: Mood and affect normal.        Speech: Speech normal.        Behavior: Behavior normal.    ED Results / Procedures / Treatments   Labs (all labs ordered are listed, but only abnormal results are displayed) Labs Reviewed  CBC WITH DIFFERENTIAL/PLATELET - Abnormal; Notable for the following components:      Result Value   Hemoglobin 12.9 (*)    All other components within normal limits  CBG MONITORING, ED - Abnormal; Notable for the following components:   Glucose-Capillary 107 (*)    All other components within normal limits  COMPREHENSIVE METABOLIC PANEL  LIPASE, BLOOD  URINALYSIS, ROUTINE W REFLEX MICROSCOPIC    EKG None  Radiology DG Chest 2 View  Result Date: 03/20/2021 CLINICAL DATA:  Syncope EXAM: CHEST - 2 VIEW COMPARISON:  02/16/2021 FINDINGS: The heart size and mediastinal contours are within normal limits. No focal airspace consolidation, pleural effusion, or pneumothorax. Chronic left-sided rib fractures. IMPRESSION: No active cardiopulmonary disease. Electronically Signed   By: Davina Poke D.O.   On: 03/20/2021 14:32    Procedures Procedures   Medications Ordered in ED Medications  sodium chloride 0.9 % bolus 1,000 mL (has no administration in time range)    ED Course  I have reviewed the triage  vital signs and the nursing notes.  Pertinent labs & imaging results that were available during my care of the patient were reviewed by me and considered in my medical decision making (see chart for details).    MDM Rules/Calculators/A&P                          Patient presented for evaluation after syncopal event.  On exam, patient appears nontoxic.  He reports he has no symptoms currently.  Per triage note, patient was mildly hypotensive.  Patient report syncope happened when he went from sitting to standing, consider orthostatics.  However patient does have complex medical history, will need labs, EKG, CT head, chest x-ray to rule out electrode abnormality, head bleed/mass, cardiac abnormality, infection, anemia.  Patient with positive orthostatics with a blood pressure dropped to 80 systolic when he stood up.  He is already received 500 cc with EMS, will give a liter bolus in the ED and continue to follow blood work and imaging.   Pt signed out to Conard Novak, PA-C for f/u on EKG, head CT, and reassessment/repeat orthostatics after fluids.   Final Clinical Impression(s) / ED Diagnoses Final diagnoses:  None    Rx / DC Orders ED Discharge Orders     None        Franchot Heidelberg, PA-C 03/20/21 1501    Lennice Sites, DO 03/23/21 1536

## 2021-03-20 NOTE — ED Triage Notes (Signed)
Per EMS pt had syncope episode and passed out in the back of the car. Pt had a vomiting episode after passing out.   Hx of HTN, DM  VS per EMS, BP 93/44, HR 70, RR 16, O2 99 RA, 98.2 T, BG 153  Per EMS Aox4. 18G LFA, given 53m NS.

## 2021-03-20 NOTE — ED Notes (Signed)
Wyn Quaker, [place] made aware of pt BG 22. 2 apple juice given to patient. Will recheck BG in 15 minutes. Per Phylliss Bob, [place] repeat BG, give [place] sandwich if above 70, then give pt fluids if no urine.

## 2021-03-24 ENCOUNTER — Other Ambulatory Visit: Payer: Self-pay | Admitting: Gastroenterology

## 2021-03-24 ENCOUNTER — Other Ambulatory Visit (HOSPITAL_COMMUNITY): Payer: Self-pay | Admitting: Gastroenterology

## 2021-03-24 DIAGNOSIS — R112 Nausea with vomiting, unspecified: Secondary | ICD-10-CM

## 2021-03-24 DIAGNOSIS — R634 Abnormal weight loss: Secondary | ICD-10-CM

## 2021-03-24 DIAGNOSIS — K3184 Gastroparesis: Secondary | ICD-10-CM

## 2021-03-30 ENCOUNTER — Encounter (HOSPITAL_COMMUNITY): Payer: Medicare Other

## 2021-04-07 ENCOUNTER — Encounter (HOSPITAL_COMMUNITY)
Admission: RE | Admit: 2021-04-07 | Discharge: 2021-04-07 | Disposition: A | Discharge status: Home / Self Care | Payer: Medicare Other | Source: Ambulatory Visit | Attending: Nephrology | Admitting: Nephrology

## 2021-04-07 ENCOUNTER — Other Ambulatory Visit: Payer: Self-pay

## 2021-04-07 ENCOUNTER — Ambulatory Visit (HOSPITAL_COMMUNITY)
Admission: RE | Admit: 2021-04-07 | Discharge: 2021-04-07 | Disposition: A | Discharge status: Home / Self Care | Payer: Medicare Other | Source: Ambulatory Visit | Attending: Gastroenterology | Admitting: Gastroenterology

## 2021-04-07 VITALS — BP 176/85 | HR 73 | Temp 97.1°F | Resp 21

## 2021-04-07 DIAGNOSIS — N1832 Chronic kidney disease, stage 3b: Secondary | ICD-10-CM | POA: Insufficient documentation

## 2021-04-07 DIAGNOSIS — R112 Nausea with vomiting, unspecified: Secondary | ICD-10-CM | POA: Diagnosis present

## 2021-04-07 DIAGNOSIS — K3184 Gastroparesis: Secondary | ICD-10-CM | POA: Diagnosis not present

## 2021-04-07 DIAGNOSIS — D631 Anemia in chronic kidney disease: Secondary | ICD-10-CM | POA: Diagnosis not present

## 2021-04-07 LAB — IRON AND TIBC
Iron: 106 ug/dL (ref 45–182)
Saturation Ratios: 45 % — ABNORMAL HIGH (ref 17.9–39.5)
TIBC: 237 ug/dL — ABNORMAL LOW (ref 250–450)
UIBC: 131 ug/dL

## 2021-04-07 LAB — FERRITIN: Ferritin: 357 ng/mL — ABNORMAL HIGH (ref 24–336)

## 2021-04-07 MED ORDER — EPOETIN ALFA-EPBX 10000 UNIT/ML IJ SOLN
10000.0000 [IU] | INTRAMUSCULAR | Status: DC
Start: 2021-04-07 — End: 2021-04-08
  Administered 2021-04-07: 10000 [IU] via SUBCUTANEOUS

## 2021-04-07 MED ORDER — EPOETIN ALFA-EPBX 10000 UNIT/ML IJ SOLN
INTRAMUSCULAR | Status: AC
  Filled 2021-04-07: qty 1

## 2021-04-07 MED ORDER — TECHNETIUM TC 99M SULFUR COLLOID
2.0000 | Freq: Once | INTRAVENOUS | Status: AC | PRN
  Administered 2021-04-07: 2 via INTRAVENOUS

## 2021-04-08 LAB — POCT HEMOGLOBIN-HEMACUE: Hemoglobin: 11.8 g/dL — ABNORMAL LOW (ref 13.0–17.0)

## 2021-04-12 ENCOUNTER — Other Ambulatory Visit: Payer: Self-pay

## 2021-04-12 ENCOUNTER — Other Ambulatory Visit: Payer: Medicare Other

## 2021-04-12 ENCOUNTER — Ambulatory Visit
Admission: RE | Admit: 2021-04-12 | Discharge: 2021-04-12 | Disposition: A | Discharge status: Home / Self Care | Payer: Medicare Other | Source: Ambulatory Visit | Attending: Gastroenterology | Admitting: Gastroenterology

## 2021-04-12 DIAGNOSIS — R634 Abnormal weight loss: Secondary | ICD-10-CM

## 2021-04-21 ENCOUNTER — Encounter (HOSPITAL_COMMUNITY)
Admission: RE | Admit: 2021-04-21 | Discharge: 2021-04-21 | Disposition: A | Discharge status: Home / Self Care | Payer: Medicare Other | Source: Ambulatory Visit | Attending: Nephrology | Admitting: Nephrology

## 2021-04-21 ENCOUNTER — Other Ambulatory Visit: Payer: Self-pay

## 2021-04-21 VITALS — BP 174/79 | HR 77 | Temp 97.6°F | Resp 18

## 2021-04-21 DIAGNOSIS — N1832 Chronic kidney disease, stage 3b: Secondary | ICD-10-CM

## 2021-04-21 LAB — POCT HEMOGLOBIN-HEMACUE: Hemoglobin: 11 g/dL — ABNORMAL LOW (ref 13.0–17.0)

## 2021-04-21 MED ORDER — EPOETIN ALFA-EPBX 10000 UNIT/ML IJ SOLN: 10000.0000 [IU] | INTRAMUSCULAR | Status: DC

## 2021-04-21 MED ORDER — EPOETIN ALFA-EPBX 10000 UNIT/ML IJ SOLN
INTRAMUSCULAR | Status: AC
  Administered 2021-04-21: 10000 [IU] via SUBCUTANEOUS
  Filled 2021-04-21: qty 1

## 2021-05-05 ENCOUNTER — Encounter (HOSPITAL_COMMUNITY): Payer: Medicare Other

## 2021-05-11 ENCOUNTER — Other Ambulatory Visit: Payer: Self-pay

## 2021-05-11 ENCOUNTER — Encounter (HOSPITAL_COMMUNITY)
Admission: RE | Admit: 2021-05-11 | Discharge: 2021-05-11 | Disposition: A | Discharge status: Home / Self Care | Payer: Medicare Other | Source: Ambulatory Visit | Attending: Nephrology | Admitting: Nephrology

## 2021-05-11 VITALS — BP 167/94 | HR 86 | Temp 98.8°F | Resp 18

## 2021-05-11 DIAGNOSIS — D631 Anemia in chronic kidney disease: Secondary | ICD-10-CM | POA: Diagnosis not present

## 2021-05-11 DIAGNOSIS — N1832 Chronic kidney disease, stage 3b: Secondary | ICD-10-CM

## 2021-05-11 DIAGNOSIS — N184 Chronic kidney disease, stage 4 (severe): Secondary | ICD-10-CM | POA: Diagnosis not present

## 2021-05-11 LAB — IRON AND TIBC
Iron: 102 ug/dL (ref 45–182)
Saturation Ratios: 41 % — ABNORMAL HIGH (ref 17.9–39.5)
TIBC: 248 ug/dL — ABNORMAL LOW (ref 250–450)
UIBC: 146 ug/dL

## 2021-05-11 LAB — FERRITIN: Ferritin: 263 ng/mL (ref 24–336)

## 2021-05-11 LAB — POCT HEMOGLOBIN-HEMACUE: Hemoglobin: 11.4 g/dL — ABNORMAL LOW (ref 13.0–17.0)

## 2021-05-11 MED ORDER — EPOETIN ALFA-EPBX 10000 UNIT/ML IJ SOLN
INTRAMUSCULAR | Status: AC
  Filled 2021-05-11: qty 1

## 2021-05-11 MED ORDER — EPOETIN ALFA-EPBX 10000 UNIT/ML IJ SOLN
10000.0000 [IU] | INTRAMUSCULAR | Status: DC
  Administered 2021-05-11: 10000 [IU] via SUBCUTANEOUS

## 2021-05-14 IMAGING — US US ABDOMEN LIMITED RUQ/ASCITES
1 series · 14 of 25 positions shown · non-contrast
Comparison: Abdominal CT 01/02/2021.

CLINICAL DATA: Right upper quadrant abdominal pain with nausea and
vomiting for 1 week. History of diabetes.

EXAM:
ULTRASOUND ABDOMEN LIMITED RIGHT UPPER QUADRANT

[Series 1: us abdomen limited ruq/ascites · 14 of 41 slices shown]
[im 1/41]
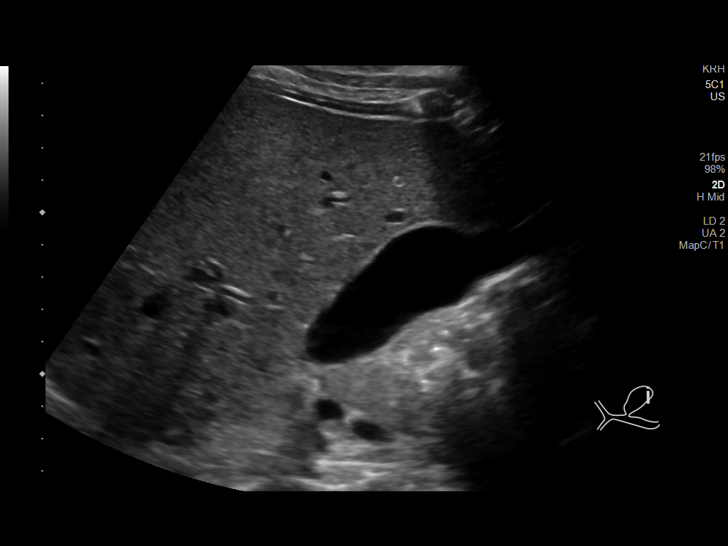
[im 4/41]
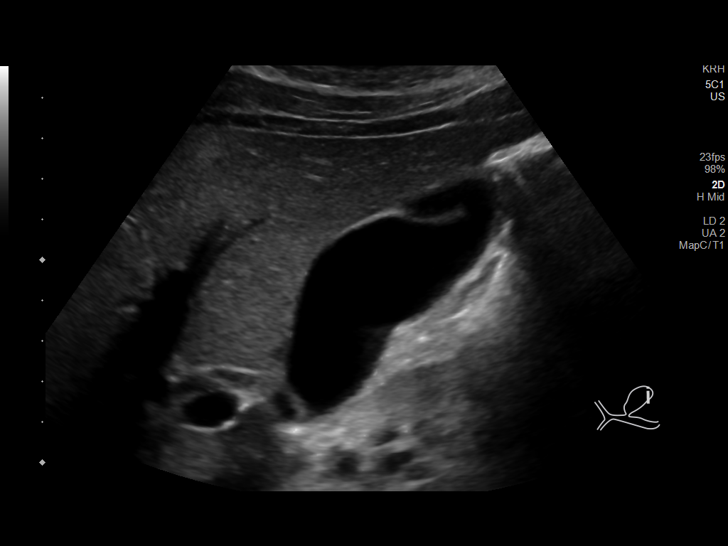
[im 7/41]
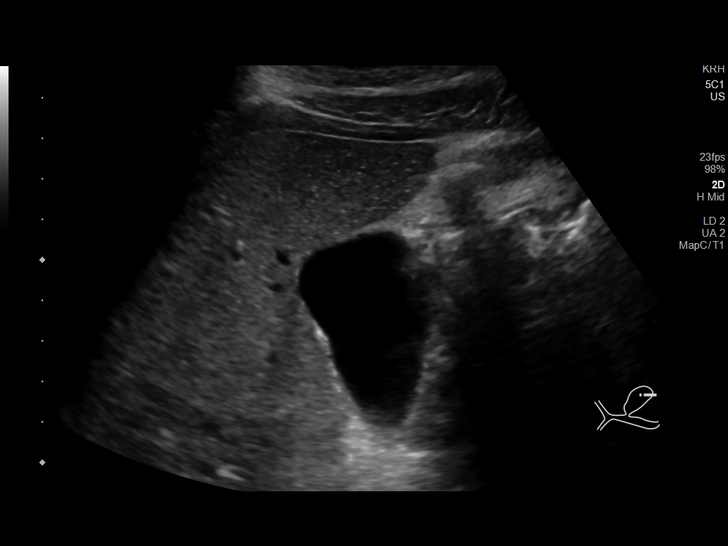
[im 11/41]
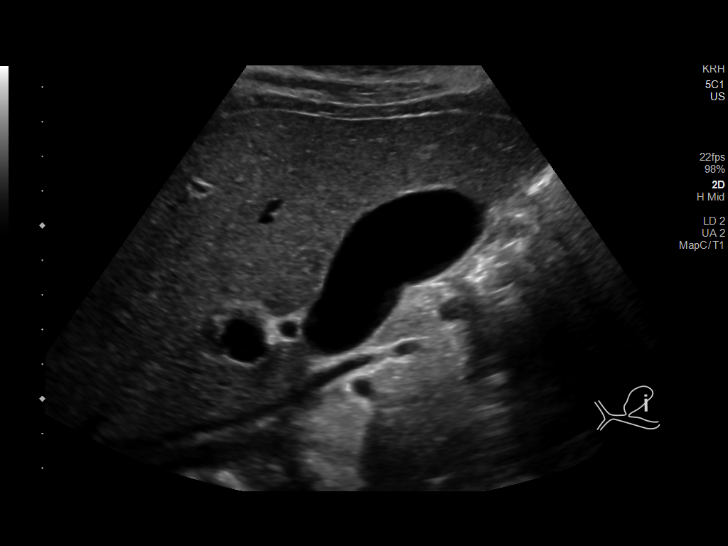
[im 14/41]
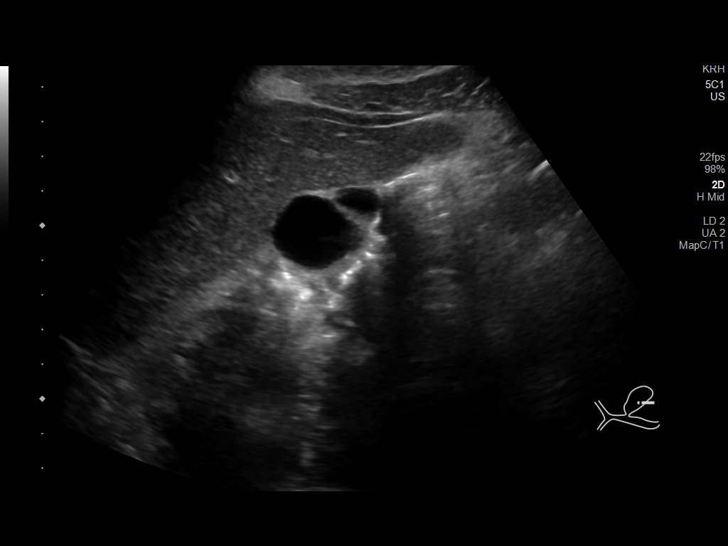
[im 16/41]
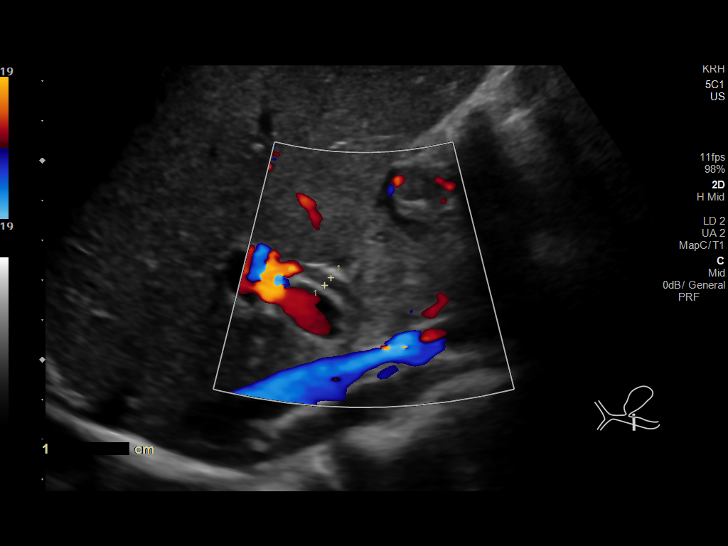
[im 19/41]
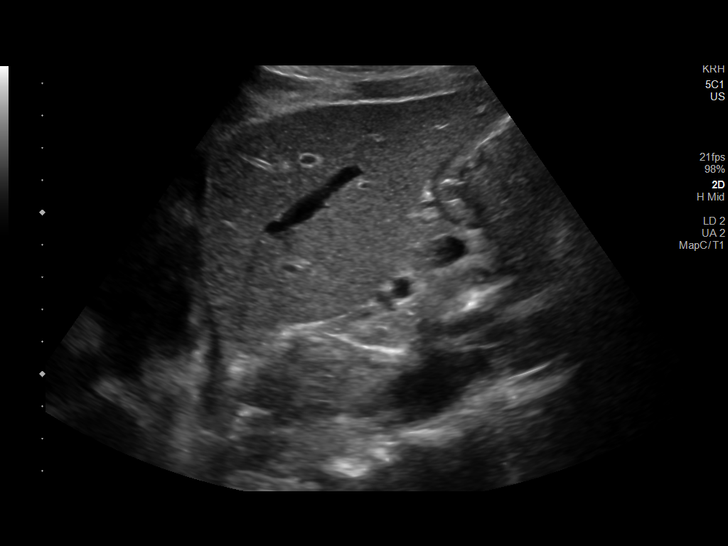
[im 22/41]
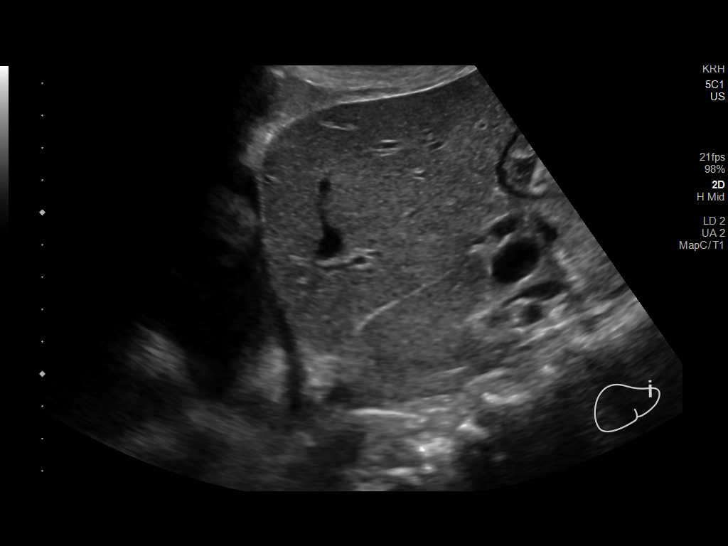
[im 26/41]
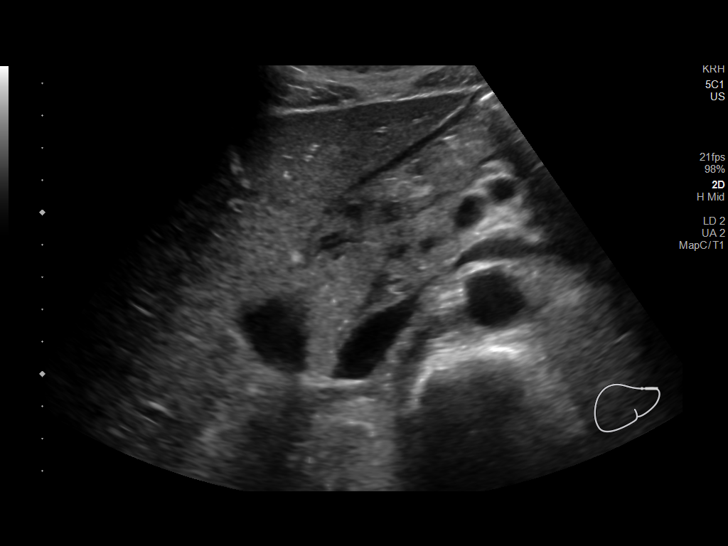
[im 27/41]
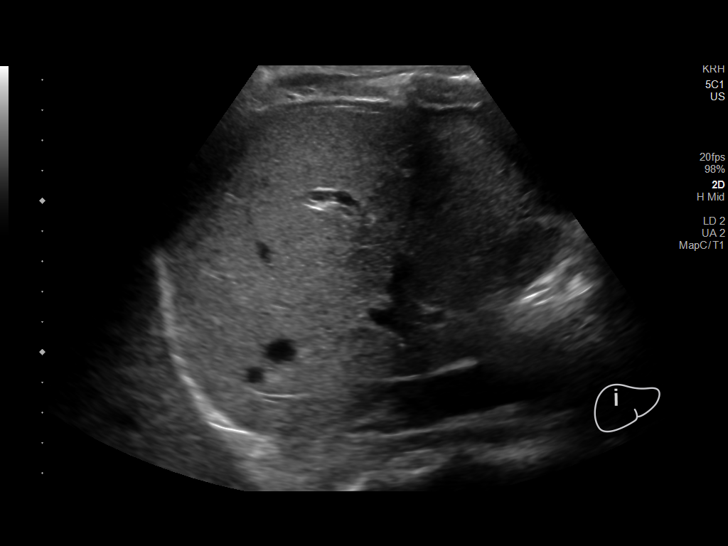
[im 31/41]
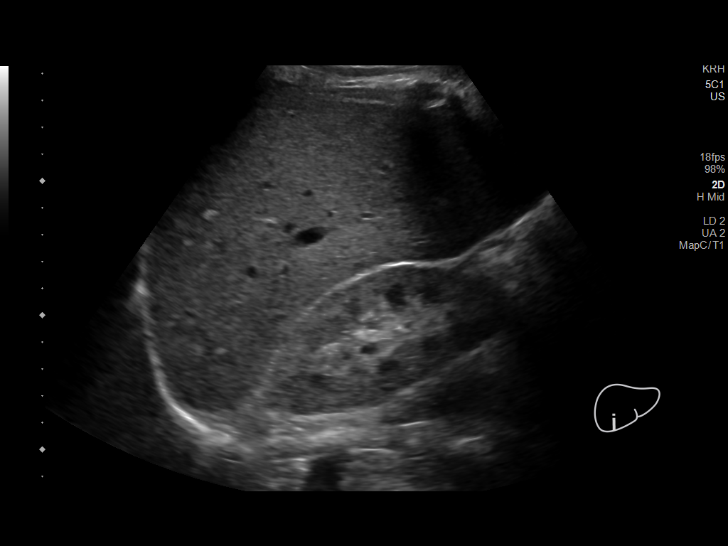
[im 34/41]
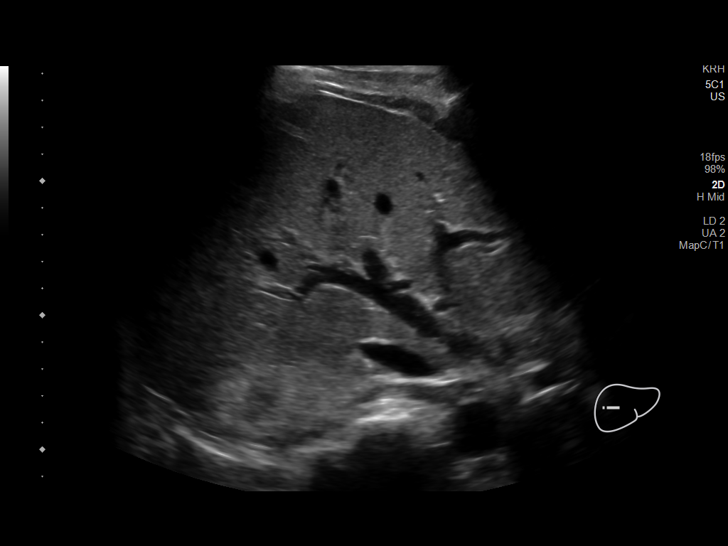
[im 37/41]
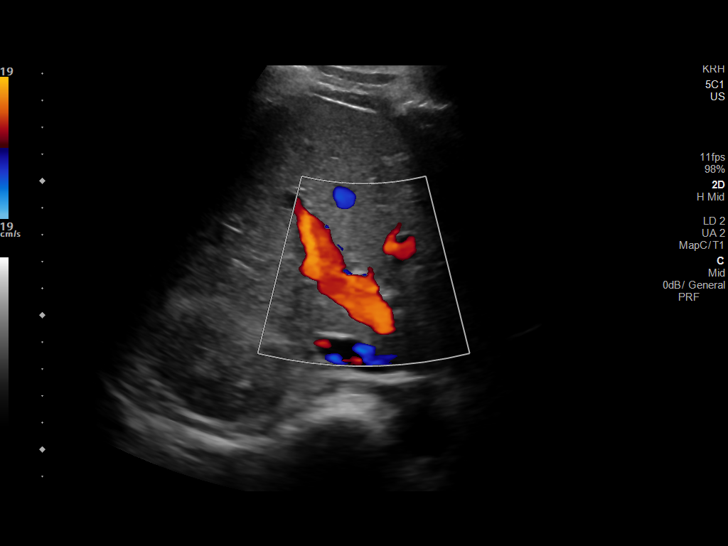
[im 41/41]
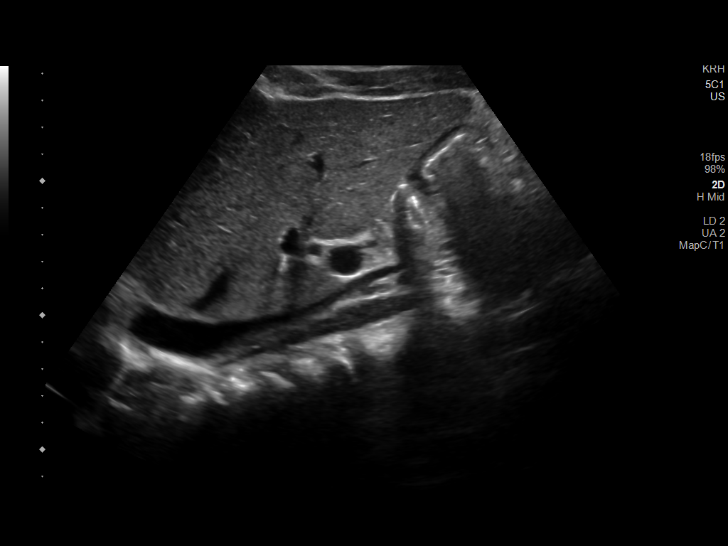

[14 of 25 positions shown; findings below may reference images not displayed]

FINDINGS: Gallbladder:

No gallstones or wall thickening visualized. No sonographic Murphy
sign noted by sonographer.

Common bile duct:

Diameter: 3 mm

Liver:

No focal lesion identified. Within normal limits in parenchymal
echogenicity. Portal vein is patent on color Doppler imaging with
normal direction of blood flow towards the liver.

Other: The sonographer notes a small amount of fluid superior to the
left hemidiaphragm, likely corresponding with a small amount of
pericardial fluid on earlier CT. No ascites.
IMPRESSION: Unremarkable right upper quadrant abdominal ultrasound.

## 2021-05-14 IMAGING — CT CT ABD-PELV W/O CM
2 of 4 series · 16 of 46 positions shown, 18 images · non-contrast
Comparison: None.

CLINICAL DATA: Generalized abdominal pain.

EXAM:
CT ABDOMEN AND PELVIS WITHOUT CONTRAST
TECHNIQUE: Multidetector CT imaging of the abdomen and pelvis was performed
following the standard protocol without IV contrast.

[Series 2: axial st · axial · 0.82mm/px · z∈[-386,+14]mm · 13 of 88 slices shown, 15 images]
[im 4/88  soft-tissue]
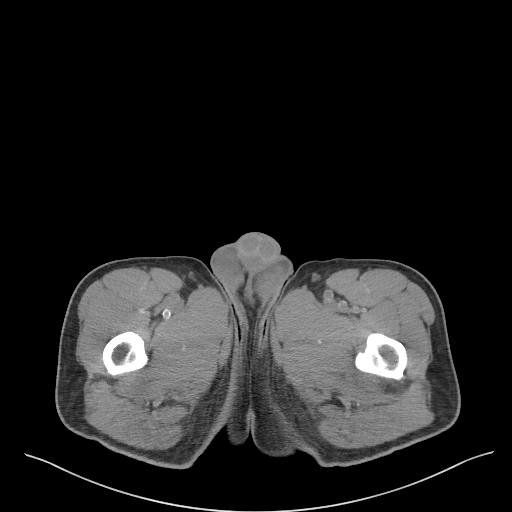
[im 4/88  bone]
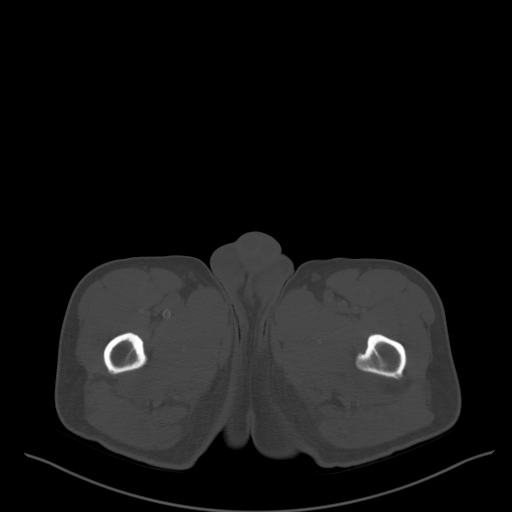
[im 11/88  soft-tissue]
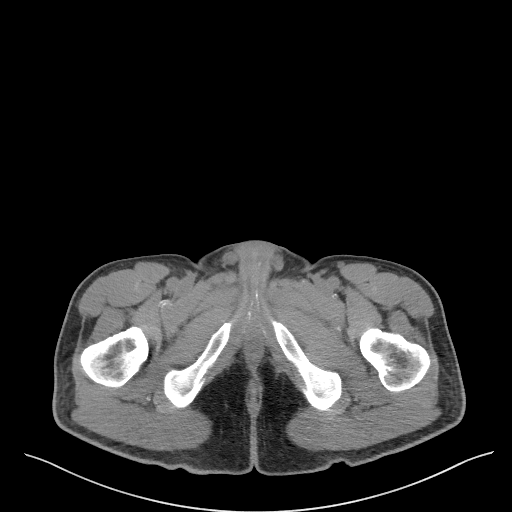
[im 18/88  soft-tissue]
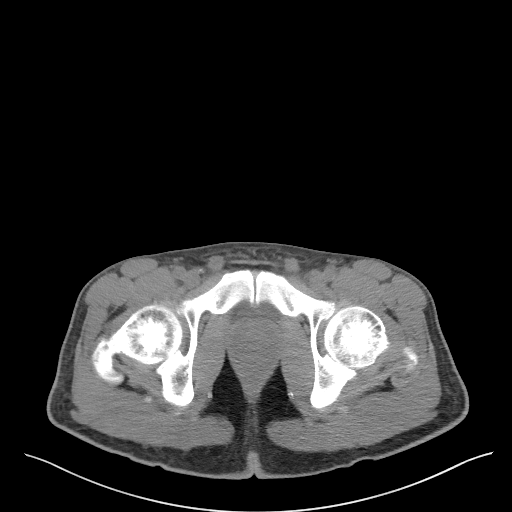
[im 25/88  soft-tissue]
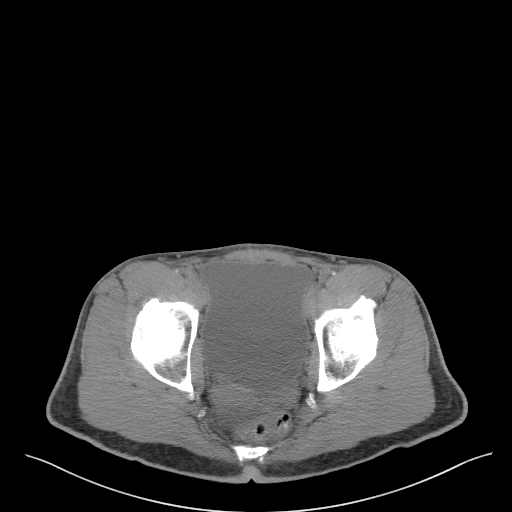
[im 32/88  soft-tissue]
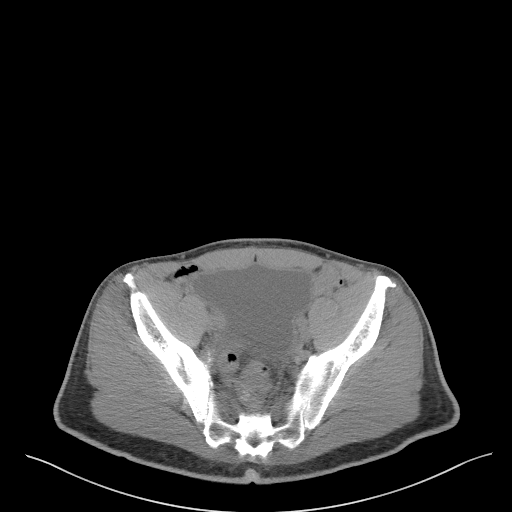
[im 39/88  soft-tissue]
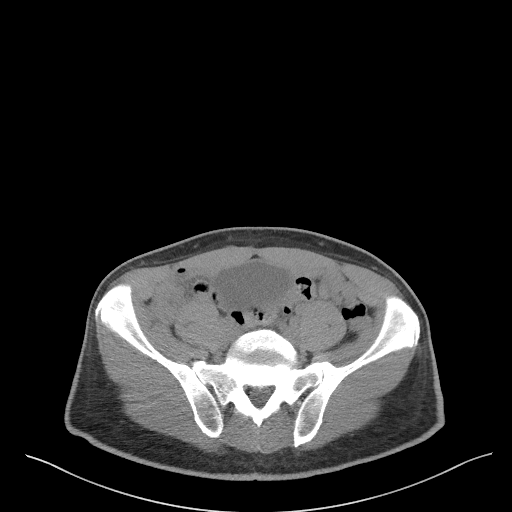
[im 46/88  soft-tissue]
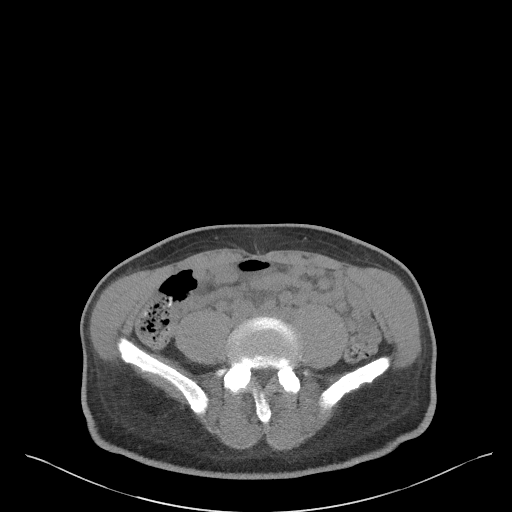
[im 49/88  soft-tissue]
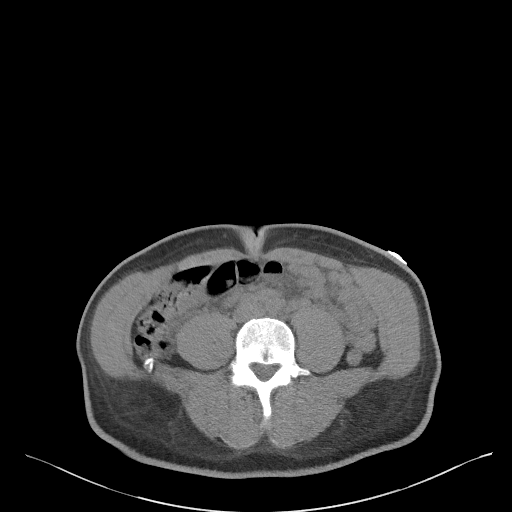
[im 56/88  soft-tissue]
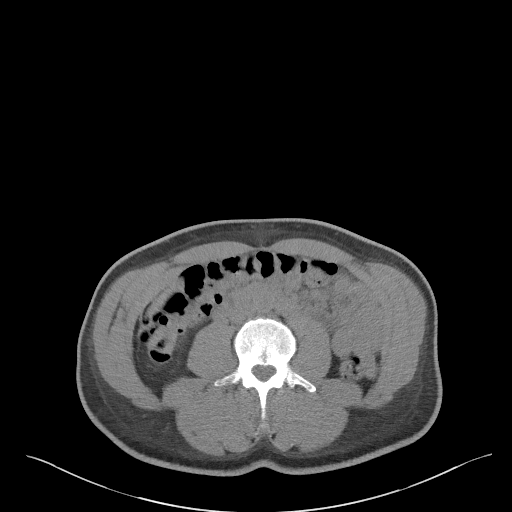
[im 56/88  bone]
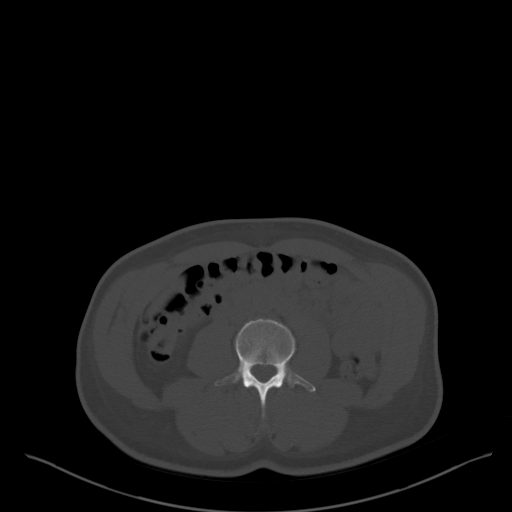
[im 63/88  soft-tissue]
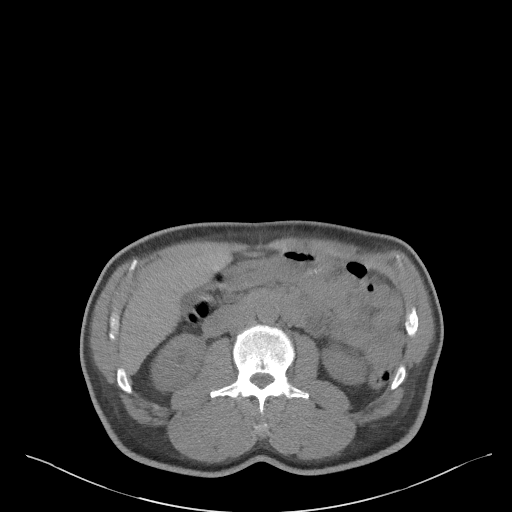
[im 70/88  soft-tissue]
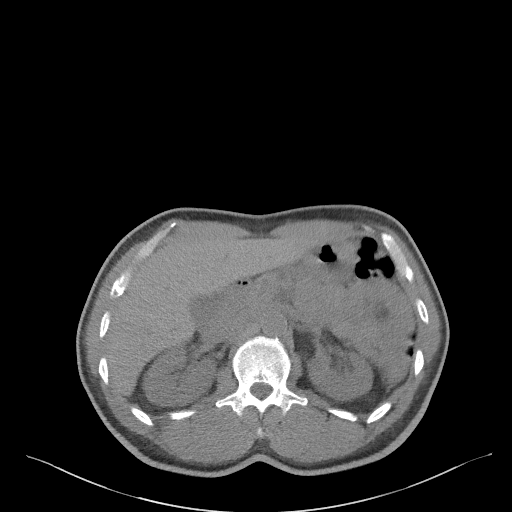
[im 77/88  soft-tissue]
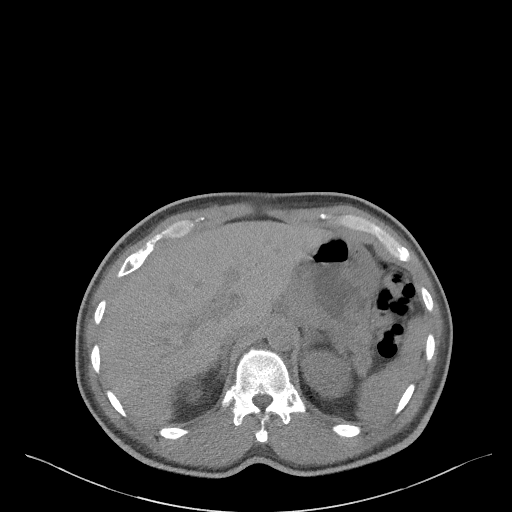
[im 84/88  soft-tissue]
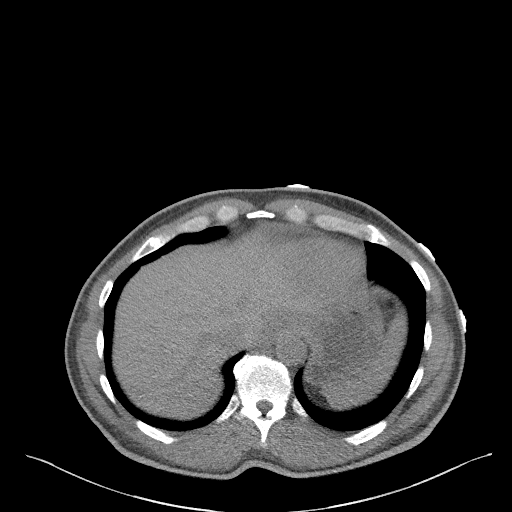

[Series 5: coronal st · coronal · 0.72mm/px · 3 of 80 slices shown]
[im 27/80  soft-tissue]
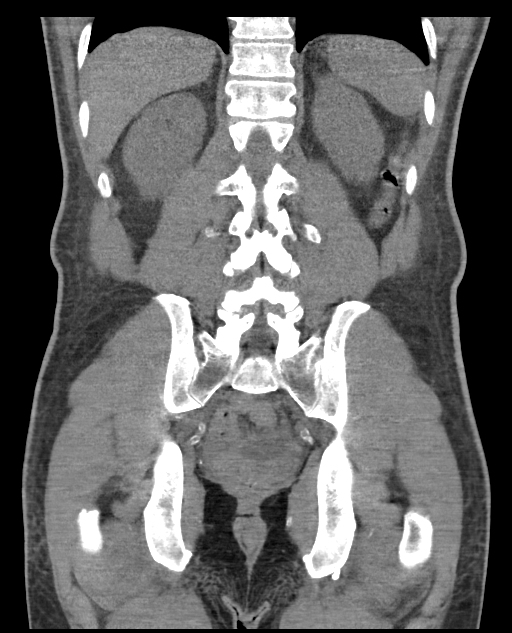
[im 36/80  soft-tissue]
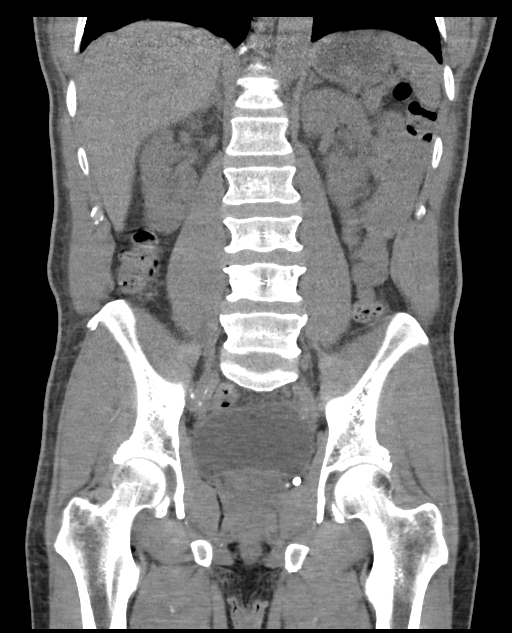
[im 44/80  soft-tissue]
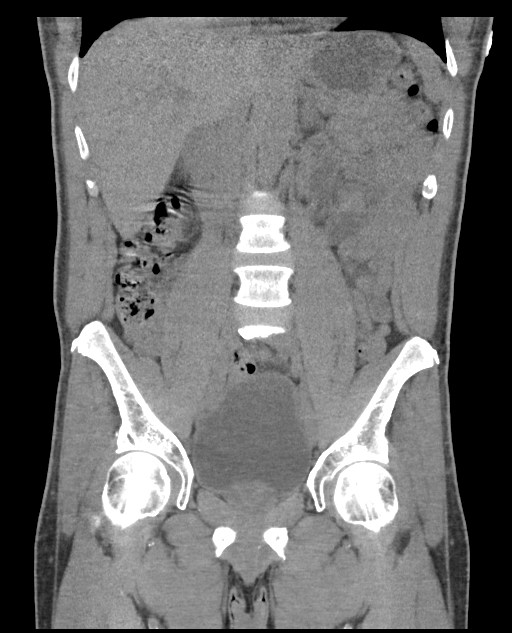

[16 of 46 positions shown; findings below may reference images not displayed]

FINDINGS: Lower chest: Lung bases are normal.

Hepatobiliary: Liver, gallbladder and biliary tree are normal.

Pancreas: Possible mild dilatation of the main pancreatic duct in
the region of the pancreatic head. This is not accurately evaluated
on this noncontrast study.

Spleen: Normal.

Adrenals/Urinary Tract: Adrenal glands are normal. Kidneys are
normal in size without hydronephrosis or nephrolithiasis. Ureters
and bladder are normal.

Stomach/Bowel: Stomach and small bowel are normal. Appendix not
visualized. Colon is unremarkable.

Vascular/Lymphatic: Abdominal aorta is normal in caliber. Remaining
vascular structures are unremarkable. No definite adenopathy.

Reproductive: Normal.

Other: No free fluid or focal inflammatory change.

Musculoskeletal: Minimal degenerate change of the spine and hips.
There is a mild compression fracture of T12 involving the superior
endplate with suggestion of acute to subacute component.
IMPRESSION: 1. No acute findings in the abdomen/pelvis.
2. Mild compression fracture of T12 involving the superior endplate
age indeterminate, although suggestion of acute to subacute
component which may account for patient's pain.
3. Possible mild dilatation of the main pancreatic duct in the
region of the pancreatic head. This is not accurately evaluated on
this noncontrast study. Recommend CT abdomen/pelvis with intravenous
contrast on an elective basis for further evaluation.

## 2021-05-19 ENCOUNTER — Encounter (HOSPITAL_COMMUNITY): Payer: Medicare Other

## 2021-05-25 ENCOUNTER — Encounter (HOSPITAL_COMMUNITY): Payer: Medicare Other

## 2021-05-25 ENCOUNTER — Other Ambulatory Visit: Payer: Self-pay

## 2021-05-25 ENCOUNTER — Encounter (HOSPITAL_COMMUNITY)
Admission: RE | Admit: 2021-05-25 | Discharge: 2021-05-25 | Disposition: A | Discharge status: Home / Self Care | Payer: Medicare Other | Source: Ambulatory Visit | Attending: Nephrology | Admitting: Nephrology

## 2021-05-25 VITALS — BP 122/67 | HR 100 | Resp 18

## 2021-05-25 DIAGNOSIS — N1832 Chronic kidney disease, stage 3b: Secondary | ICD-10-CM

## 2021-05-25 DIAGNOSIS — N184 Chronic kidney disease, stage 4 (severe): Secondary | ICD-10-CM | POA: Diagnosis not present

## 2021-05-25 LAB — POCT HEMOGLOBIN-HEMACUE: Hemoglobin: 12 g/dL — ABNORMAL LOW (ref 13.0–17.0)

## 2021-05-25 MED ORDER — EPOETIN ALFA-EPBX 10000 UNIT/ML IJ SOLN: 10000.0000 [IU] | INTRAMUSCULAR | Status: DC

## 2021-05-30 ENCOUNTER — Encounter (INDEPENDENT_AMBULATORY_CARE_PROVIDER_SITE_OTHER): Payer: Self-pay

## 2021-05-30 ENCOUNTER — Encounter (INDEPENDENT_AMBULATORY_CARE_PROVIDER_SITE_OTHER): Payer: Medicare Other | Admitting: Ophthalmology

## 2021-05-30 DIAGNOSIS — E113521 Type 2 diabetes mellitus with proliferative diabetic retinopathy with traction retinal detachment involving the macula, right eye: Secondary | ICD-10-CM

## 2021-05-30 DIAGNOSIS — H25813 Combined forms of age-related cataract, bilateral: Secondary | ICD-10-CM

## 2021-05-30 DIAGNOSIS — H3581 Retinal edema: Secondary | ICD-10-CM

## 2021-05-30 DIAGNOSIS — I10 Essential (primary) hypertension: Secondary | ICD-10-CM

## 2021-05-30 DIAGNOSIS — E113512 Type 2 diabetes mellitus with proliferative diabetic retinopathy with macular edema, left eye: Secondary | ICD-10-CM

## 2021-05-30 DIAGNOSIS — H35033 Hypertensive retinopathy, bilateral: Secondary | ICD-10-CM

## 2021-06-08 ENCOUNTER — Encounter (HOSPITAL_COMMUNITY)
Admission: RE | Admit: 2021-06-08 | Discharge: 2021-06-08 | Disposition: A | Discharge status: Home / Self Care | Payer: Medicare Other | Source: Ambulatory Visit | Attending: Nephrology | Admitting: Nephrology

## 2021-06-08 ENCOUNTER — Other Ambulatory Visit: Payer: Self-pay

## 2021-06-08 ENCOUNTER — Encounter (HOSPITAL_COMMUNITY): Payer: Medicare Other

## 2021-06-08 VITALS — BP 174/80 | HR 72 | Temp 98.9°F | Resp 17

## 2021-06-08 DIAGNOSIS — N1832 Chronic kidney disease, stage 3b: Secondary | ICD-10-CM | POA: Diagnosis not present

## 2021-06-08 DIAGNOSIS — D631 Anemia in chronic kidney disease: Secondary | ICD-10-CM | POA: Diagnosis not present

## 2021-06-08 LAB — POCT HEMOGLOBIN-HEMACUE: Hemoglobin: 10.4 g/dL — ABNORMAL LOW (ref 13.0–17.0)

## 2021-06-08 LAB — IRON AND TIBC
Iron: 89 ug/dL (ref 45–182)
Saturation Ratios: 37 % (ref 17.9–39.5)
TIBC: 239 ug/dL — ABNORMAL LOW (ref 250–450)
UIBC: 150 ug/dL

## 2021-06-08 LAB — FERRITIN: Ferritin: 183 ng/mL (ref 24–336)

## 2021-06-08 MED ORDER — EPOETIN ALFA-EPBX 10000 UNIT/ML IJ SOLN
INTRAMUSCULAR | Status: AC
  Filled 2021-06-08: qty 1

## 2021-06-08 MED ORDER — EPOETIN ALFA-EPBX 10000 UNIT/ML IJ SOLN
10000.0000 [IU] | INTRAMUSCULAR | Status: DC
  Administered 2021-06-08: 10000 [IU] via SUBCUTANEOUS
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
## 2021-06-13 ENCOUNTER — Encounter (INDEPENDENT_AMBULATORY_CARE_PROVIDER_SITE_OTHER): Payer: Self-pay | Admitting: Ophthalmology

## 2021-06-13 ENCOUNTER — Other Ambulatory Visit: Payer: Self-pay

## 2021-06-13 ENCOUNTER — Ambulatory Visit (INDEPENDENT_AMBULATORY_CARE_PROVIDER_SITE_OTHER): Payer: Medicare Other | Admitting: Ophthalmology

## 2021-06-13 DIAGNOSIS — H25813 Combined forms of age-related cataract, bilateral: Secondary | ICD-10-CM

## 2021-06-13 DIAGNOSIS — H3581 Retinal edema: Secondary | ICD-10-CM

## 2021-06-13 DIAGNOSIS — I10 Essential (primary) hypertension: Secondary | ICD-10-CM | POA: Diagnosis not present

## 2021-06-13 DIAGNOSIS — E113521 Type 2 diabetes mellitus with proliferative diabetic retinopathy with traction retinal detachment involving the macula, right eye: Secondary | ICD-10-CM

## 2021-06-13 DIAGNOSIS — H35033 Hypertensive retinopathy, bilateral: Secondary | ICD-10-CM

## 2021-06-13 DIAGNOSIS — E113512 Type 2 diabetes mellitus with proliferative diabetic retinopathy with macular edema, left eye: Secondary | ICD-10-CM | POA: Diagnosis not present

## 2021-06-28 IMAGING — DX DG CHEST 1V PORT
1 series · 1 of 1 positions shown · non-contrast
Comparison: 01/02/2021.

CLINICAL DATA: Weakness, hypoglycemia.

EXAM:
PORTABLE CHEST 1 VIEW

[chest]
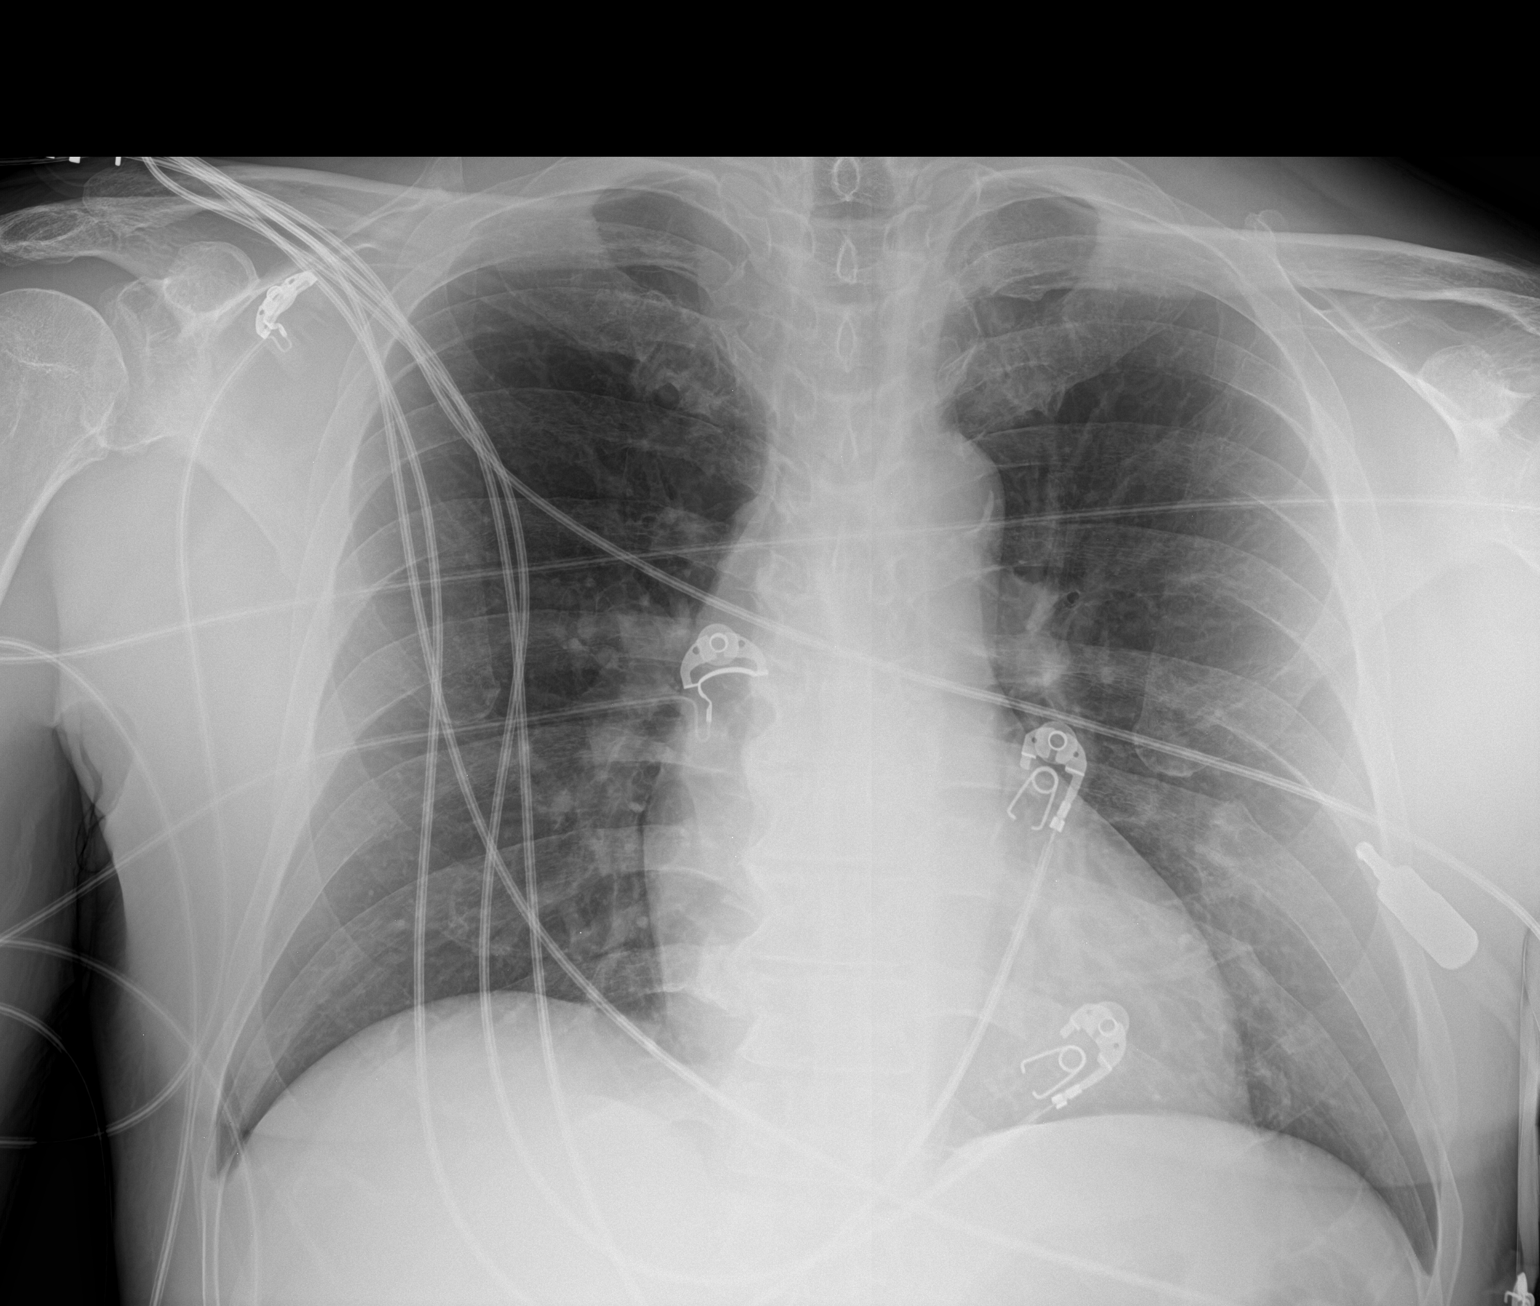

[1 of 1 positions shown; findings below may reference images not displayed]

FINDINGS: Trachea is midline. Heart size normal. Lungs are clear. No pleural
fluid.
IMPRESSION: No acute findings.

## 2021-07-06 ENCOUNTER — Ambulatory Visit (HOSPITAL_COMMUNITY)
Admission: RE | Admit: 2021-07-06 | Discharge: 2021-07-06 | Disposition: A | Discharge status: Home / Self Care | Payer: Medicare Other | Source: Ambulatory Visit | Attending: Nephrology | Admitting: Nephrology

## 2021-07-06 ENCOUNTER — Other Ambulatory Visit: Payer: Self-pay

## 2021-07-06 VITALS — HR 82 | Temp 97.3°F | Resp 18

## 2021-07-06 DIAGNOSIS — N1832 Chronic kidney disease, stage 3b: Secondary | ICD-10-CM

## 2021-07-06 DIAGNOSIS — D631 Anemia in chronic kidney disease: Secondary | ICD-10-CM | POA: Diagnosis not present

## 2021-07-06 LAB — IRON AND TIBC
Iron: 84 ug/dL (ref 45–182)
Saturation Ratios: 34 % (ref 17.9–39.5)
TIBC: 246 ug/dL — ABNORMAL LOW (ref 250–450)
UIBC: 162 ug/dL

## 2021-07-06 LAB — FERRITIN: Ferritin: 303 ng/mL (ref 24–336)

## 2021-07-06 LAB — POCT HEMOGLOBIN-HEMACUE: Hemoglobin: 11.1 g/dL — ABNORMAL LOW (ref 13.0–17.0)

## 2021-07-06 MED ORDER — EPOETIN ALFA-EPBX 10000 UNIT/ML IJ SOLN
INTRAMUSCULAR | Status: AC
  Filled 2021-07-06: qty 1

## 2021-07-06 MED ORDER — EPOETIN ALFA-EPBX 10000 UNIT/ML IJ SOLN: 10000.0000 [IU] | INTRAMUSCULAR | Status: DC

## 2021-07-20 DIAGNOSIS — I639 Cerebral infarction, unspecified: Secondary | ICD-10-CM

## 2021-07-20 HISTORY — DX: Cerebral infarction, unspecified: I63.9

## 2021-08-03 ENCOUNTER — Encounter (HOSPITAL_COMMUNITY)
Admission: RE | Admit: 2021-08-03 | Discharge: 2021-08-03 | Disposition: A | Discharge status: Home / Self Care | Payer: Medicare Other | Source: Ambulatory Visit | Attending: Nephrology | Admitting: Nephrology

## 2021-08-03 ENCOUNTER — Other Ambulatory Visit: Payer: Self-pay

## 2021-08-03 VITALS — BP 157/75 | HR 89 | Temp 97.5°F

## 2021-08-03 DIAGNOSIS — N185 Chronic kidney disease, stage 5: Secondary | ICD-10-CM | POA: Diagnosis present

## 2021-08-03 DIAGNOSIS — D631 Anemia in chronic kidney disease: Secondary | ICD-10-CM | POA: Insufficient documentation

## 2021-08-03 DIAGNOSIS — N1832 Chronic kidney disease, stage 3b: Secondary | ICD-10-CM

## 2021-08-03 LAB — IRON AND TIBC
Iron: 94 ug/dL (ref 45–182)
Saturation Ratios: 38 % (ref 17.9–39.5)
TIBC: 246 ug/dL — ABNORMAL LOW (ref 250–450)
UIBC: 152 ug/dL

## 2021-08-03 LAB — FERRITIN: Ferritin: 172 ng/mL (ref 24–336)

## 2021-08-03 LAB — POCT HEMOGLOBIN-HEMACUE: Hemoglobin: 11.2 g/dL — ABNORMAL LOW (ref 13.0–17.0)

## 2021-08-03 MED ORDER — EPOETIN ALFA-EPBX 10000 UNIT/ML IJ SOLN
10000.0000 [IU] | INTRAMUSCULAR | Status: DC
  Administered 2021-08-03: 10000 [IU] via SUBCUTANEOUS

## 2021-08-03 MED ORDER — EPOETIN ALFA-EPBX 10000 UNIT/ML IJ SOLN
INTRAMUSCULAR | Status: AC
  Filled 2021-08-03: qty 1

## 2021-08-31 ENCOUNTER — Encounter (HOSPITAL_COMMUNITY)
Admission: RE | Admit: 2021-08-31 | Discharge: 2021-08-31 | Disposition: A | Discharge status: Home / Self Care | Payer: Medicare Other | Source: Ambulatory Visit | Attending: Nephrology | Admitting: Nephrology

## 2021-08-31 ENCOUNTER — Other Ambulatory Visit: Payer: Self-pay

## 2021-08-31 VITALS — BP 179/77 | HR 89 | Temp 97.6°F | Resp 20

## 2021-08-31 DIAGNOSIS — N1832 Chronic kidney disease, stage 3b: Secondary | ICD-10-CM

## 2021-08-31 DIAGNOSIS — N185 Chronic kidney disease, stage 5: Secondary | ICD-10-CM | POA: Diagnosis not present

## 2021-08-31 LAB — POCT HEMOGLOBIN-HEMACUE: Hemoglobin: 11 g/dL — ABNORMAL LOW (ref 13.0–17.0)

## 2021-08-31 MED ORDER — EPOETIN ALFA-EPBX 10000 UNIT/ML IJ SOLN
INTRAMUSCULAR | Status: AC
  Administered 2021-08-31: 10000 [IU] via SUBCUTANEOUS
  Filled 2021-08-31: qty 1

## 2021-08-31 MED ORDER — EPOETIN ALFA-EPBX 10000 UNIT/ML IJ SOLN: 10000.0000 [IU] | INTRAMUSCULAR | Status: DC

## 2021-09-28 ENCOUNTER — Encounter (HOSPITAL_COMMUNITY): Payer: Medicare Other

## 2021-10-01 ENCOUNTER — Emergency Department (HOSPITAL_COMMUNITY)
Admission: EM | Admit: 2021-10-01 | Discharge: 2021-10-01 | Disposition: A | Discharge status: Home / Self Care | Payer: Medicare Other | Attending: Emergency Medicine | Admitting: Emergency Medicine

## 2021-10-01 ENCOUNTER — Emergency Department (HOSPITAL_COMMUNITY): Payer: Medicare Other

## 2021-10-01 ENCOUNTER — Other Ambulatory Visit: Payer: Self-pay

## 2021-10-01 ENCOUNTER — Encounter (HOSPITAL_COMMUNITY): Payer: Self-pay | Admitting: Emergency Medicine

## 2021-10-01 DIAGNOSIS — Z91040 Latex allergy status: Secondary | ICD-10-CM | POA: Diagnosis not present

## 2021-10-01 DIAGNOSIS — E1040 Type 1 diabetes mellitus with diabetic neuropathy, unspecified: Secondary | ICD-10-CM | POA: Insufficient documentation

## 2021-10-01 DIAGNOSIS — Z7902 Long term (current) use of antithrombotics/antiplatelets: Secondary | ICD-10-CM | POA: Insufficient documentation

## 2021-10-01 DIAGNOSIS — E1022 Type 1 diabetes mellitus with diabetic chronic kidney disease: Secondary | ICD-10-CM | POA: Insufficient documentation

## 2021-10-01 DIAGNOSIS — Z7982 Long term (current) use of aspirin: Secondary | ICD-10-CM | POA: Diagnosis not present

## 2021-10-01 DIAGNOSIS — U071 COVID-19: Secondary | ICD-10-CM | POA: Insufficient documentation

## 2021-10-01 DIAGNOSIS — Z7984 Long term (current) use of oral hypoglycemic drugs: Secondary | ICD-10-CM | POA: Insufficient documentation

## 2021-10-01 DIAGNOSIS — Z79899 Other long term (current) drug therapy: Secondary | ICD-10-CM | POA: Insufficient documentation

## 2021-10-01 DIAGNOSIS — E109 Type 1 diabetes mellitus without complications: Secondary | ICD-10-CM

## 2021-10-01 DIAGNOSIS — R059 Cough, unspecified: Secondary | ICD-10-CM | POA: Diagnosis present

## 2021-10-01 DIAGNOSIS — Z794 Long term (current) use of insulin: Secondary | ICD-10-CM | POA: Insufficient documentation

## 2021-10-01 DIAGNOSIS — N184 Chronic kidney disease, stage 4 (severe): Secondary | ICD-10-CM | POA: Insufficient documentation

## 2021-10-01 LAB — BASIC METABOLIC PANEL
Anion gap: 12 (ref 5–15)
BUN: 81 mg/dL — ABNORMAL HIGH (ref 6–20)
CO2: 23 mmol/L (ref 22–32)
Calcium: 9.3 mg/dL (ref 8.9–10.3)
Chloride: 103 mmol/L (ref 98–111)
Creatinine, Ser: 6.8 mg/dL — ABNORMAL HIGH (ref 0.61–1.24)
GFR, Estimated: 9 mL/min — ABNORMAL LOW (ref >60)
Glucose, Bld: 174 mg/dL — ABNORMAL HIGH (ref 70–99)
Potassium: 4 mmol/L (ref 3.5–5.1)
Sodium: 138 mmol/L (ref 135–145)

## 2021-10-01 LAB — CBC
HCT: 42.8 % (ref 39.0–52.0)
Hemoglobin: 13.5 g/dL (ref 13.0–17.0)
MCH: 28.4 pg (ref 26.0–34.0)
MCHC: 31.5 g/dL (ref 30.0–36.0)
MCV: 90.1 fL (ref 80.0–100.0)
Platelets: 253 10*3/uL (ref 150–400)
RBC: 4.75 MIL/uL (ref 4.22–5.81)
RDW: 13.3 % (ref 11.5–15.5)
WBC: 9.1 10*3/uL (ref 4.0–10.5)
nRBC: 0 % (ref 0.0–0.2)

## 2021-10-01 LAB — URINALYSIS, ROUTINE W REFLEX MICROSCOPIC
Bacteria, UA: NONE SEEN
Bilirubin Urine: NEGATIVE
Glucose, UA: 150 mg/dL — AB
Hgb urine dipstick: NEGATIVE
Ketones, ur: NEGATIVE mg/dL
Leukocytes,Ua: NEGATIVE
Nitrite: NEGATIVE
Protein, ur: 100 mg/dL — AB
Specific Gravity, Urine: 1.014 (ref 1.005–1.030)
pH: 5 (ref 5.0–8.0)

## 2021-10-01 LAB — RESP PANEL BY RT-PCR (FLU A&B, COVID) ARPGX2
Influenza A by PCR: NEGATIVE
Influenza B by PCR: NEGATIVE
SARS Coronavirus 2 by RT PCR: POSITIVE — AB

## 2021-10-01 LAB — HEPATIC FUNCTION PANEL
ALT: 91 U/L — ABNORMAL HIGH (ref 0–44)
AST: 27 U/L (ref 15–41)
Albumin: 3.8 g/dL (ref 3.5–5.0)
Alkaline Phosphatase: 106 U/L (ref 38–126)
Bilirubin, Direct: <0.1 mg/dL (ref 0.0–0.2)
Total Bilirubin: 0.8 mg/dL (ref 0.3–1.2)
Total Protein: 7.1 g/dL (ref 6.5–8.1)

## 2021-10-01 LAB — CBG MONITORING, ED: Glucose-Capillary: 162 mg/dL — ABNORMAL HIGH (ref 70–99)

## 2021-10-01 MED ORDER — ONDANSETRON HCL 4 MG/2ML IJ SOLN
4.0000 mg | Freq: Once | INTRAMUSCULAR | Status: AC
  Administered 2021-10-01: 4 mg via INTRAVENOUS
  Filled 2021-10-01: qty 2

## 2021-10-01 MED ORDER — SODIUM CHLORIDE 0.9 % IV BOLUS
250.0000 mL | Freq: Once | INTRAVENOUS | Status: AC
  Administered 2021-10-01: 250 mL via INTRAVENOUS

## 2021-10-01 MED ORDER — ONDANSETRON 4 MG PO TBDP
4.0000 mg | ORAL_TABLET | Freq: Three times a day (TID) | ORAL | 1 refills | Status: DC | PRN
Start: 2021-10-01 — End: 2022-05-30

## 2021-10-01 MED ORDER — SODIUM CHLORIDE 0.9 % IV SOLN: INTRAVENOUS | Status: DC

## 2021-10-01 MED ORDER — SODIUM CHLORIDE 0.9% FLUSH
3.0000 mL | Freq: Once | INTRAVENOUS | Status: AC
  Administered 2021-10-01: 3 mL via INTRAVENOUS

## 2021-10-01 NOTE — Discharge Instructions (Addendum)
Take Zofran as needed for nausea and vomiting.  Follow-up with your doctors.  So that your kidney function can be rechecked.  Return for any worse breathing problems.  Chest x-ray today was clear no signs of pneumonia.  Oxygen saturations have been good.

## 2021-10-01 NOTE — ED Provider Notes (Addendum)
[facility] EMERGENCY DEPARTMENT Provider Note   CSN: [id number] Arrival date & time: 10/01/21  1131     History No chief complaint on file.   Louis Peters is a 55 y.o. male.  Patient has type 1 diabetes.  Known to have chronic kidney disease stage IV.  Patient tested positive at home for COVID on Monday.  Seen by virtual visit on Tuesday.  Patient given some cough medicine.  Family is concerned because not eating and drinking much.  Blood sugars have been high at home.  Patient clearly at high risk for COVID infection.  Patient's main complaints are generalized weakness vomiting diarrhea.  Cough is improving some with the cough medicine.  Patient's symptoms have been ongoing since last weekend      Past Medical History:  Diagnosis Date   Benign hypertension with chronic kidney disease, stage III ([place]) 03/10/2019   Chronic kidney disease (CKD), stage III (moderate) ([place]) 03/10/2019   Diabetes mellitus type 1, uncomplicated ([place]) 2/0/2542   Diabetes mellitus without complication ([place])    Diabetic retinopathy ([place])    PDR OU   Heat Injury 03/10/2019   Hypercholesterolemia 03/10/2019   Hypertension    Retinal detachment    TRD OD    Patient Active Problem List   Diagnosis Date Noted   Intractable nausea and vomiting 01/02/2021   Slurred speech 07/26/2020   Hypertension    Hypertensive urgency    Ischemic stroke ([place])    History of amputation of lesser toe ([place]) 07/30/2019   AKI (acute kidney injury) ([place]) 03/10/2019   Heat Injury 03/10/2019   Chronic kidney disease (CKD), stage III (moderate) ([place]) 03/10/2019   Benign hypertension with chronic kidney disease, stage III ([place]) 03/10/2019   Hypercholesterolemia 03/10/2019   Acute kidney injury superimposed on chronic kidney disease ([place])    Diabetic infection of left foot ([place]) 08/10/2018   Hyperlipidemia due to type 1 diabetes mellitus ([place]) 07/31/2018   Combined forms of age-related cataract of both eyes  01/25/2017   Proliferative diabetic retinopathy of left eye without macular edema associated with type 1 diabetes mellitus ([place]) 01/25/2017   Vitreous hemorrhage of left eye ([place]) 01/25/2017   Fungal dermatitis 02/18/2016   Diabetes mellitus type 1, uncomplicated ([place]) 70/62/3762   Edema 11/27/2012   Proteinuria 09/30/2012   Diabetic peripheral neuropathy ([place]) 05/02/2012   Proliferative diabetic retinopathy ([place]) 03/24/2012    Past Surgical History:  Procedure Laterality Date   AV FISTULA PLACEMENT Right 11/08/2020   Procedure: RIGHT ARM BRACHIOCEPHALIC ARTERIOVENOUS (AV) FISTULA CREATION;  Surgeon: Angelia Mould, MD;  Location: [facility] OR;  Service: Vascular;  Laterality: Right;   CATARACT EXTRACTION     EYE SURGERY     RETINAL DETACHMENT SURGERY         Family History  Problem Relation Age of Onset   Diabetes Maternal Grandmother    Diabetes Sister     Social History   Tobacco Use   Smoking status: Never   Smokeless tobacco: Never  Vaping Use   Vaping Use: Never used  Substance Use Topics   Alcohol use: Yes    Comment: occ   Drug use: No    Home Medications Prior to Admission medications   Medication Sig Start Date End Date Taking? Authorizing Provider  amLODipine (NORVASC) 10 MG tablet Take 10 mg by mouth every morning. 01/16/17   [provider]  ammonium lactate (LAC-HYDRIN) 12 % lotion Apply 1 application topically daily as needed for dry  skin. 10/01/18   [provider]  aspirin EC 81 MG EC tablet Take 1 tablet (81 mg total) by mouth daily. Swallow whole. Patient taking differently: Take 81 mg by mouth every morning. Swallow whole. 07/24/20   Geradine Girt, DO  atorvastatin (LIPITOR) 80 MG tablet Take 1 tablet (80 mg total) by mouth daily. Patient taking differently: Take 80 mg by mouth every morning. 07/24/20   Geradine Girt, DO  Blood Glucose Monitoring Suppl ([place]) w/Device KIT by Does not apply route. 07/31/18    [provider]  cholecalciferol (VITAMIN D3) 25 MCG (1000 UNIT) tablet Take 1,000 Units by mouth every morning.    [provider]  clopidogrel (PLAVIX) 75 MG tablet Take 75 mg by mouth every morning. 11/28/20   [provider]  Continuous Blood Gluc Receiver (FREESTYLE LIBRE 2 READER) DEVI by Does not apply route. 04/08/20   [provider]  Continuous Blood Gluc Sensor (FREESTYLE LIBRE 2 SENSOR) MISC by Does not apply route. 04/14/21   [provider]  furosemide (LASIX) 40 MG tablet Take 1 tablet (40 mg total) by mouth daily. 01/07/21 01/07/22  Mercy Riding, MD  Glucagon (BAQSIMI ONE PACK) 3 MG/DOSE POWD Place 3 mg into the nose once as needed (low blood sugar).    [provider]  hydrALAZINE (APRESOLINE) 100 MG tablet Take 100 mg by mouth 3 (three) times daily.    [provider]  hydrochlorothiazide (HYDRODIURIL) 25 MG tablet Take 25 mg by mouth daily. 03/08/21   [provider]  insulin degludec (TRESIBA) 100 UNIT/ML SOPN FlexTouch Pen Inject 12 Units into the skin at bedtime. 04/30/19   [provider]  insulin lispro (HUMALOG KWIKPEN) 200 UNIT/ML KwikPen Inject 5-8 Units into the skin See admin instructions. Inject 5 units subcutaneously before breakfast and lunch, inject 8 units before supper    [provider]  Insulin Pen Needle 32G X 4 MM MISC Use with lantus and humalog 4 imes per day 07/26/17   [provider]  labetalol (NORMODYNE) 100 MG tablet Take 100 mg by mouth 2 (two) times daily with breakfast and lunch. 12/08/20   [provider]  losartan (COZAAR) 100 MG tablet TAKE 1 TABLET(100 MG) BY MOUTH DAILY 05/06/21   [provider]  losartan (COZAAR) 50 MG tablet Take 1 tablet (50 mg total) by mouth daily. 01/03/21 01/03/22  Mercy Riding, MD  metoCLOPramide (REGLAN) 5 MG tablet Take 1 tablet (5 mg total) by mouth 3 (three) times daily before meals for 5 days, THEN 1 tablet (5 mg  total) every 6 (six) hours as needed for up to 5 days for nausea. Patient not taking: Reported on 03/20/2021 01/03/21 01/13/21  Mercy Riding, MD  ondansetron (ZOFRAN-ODT) 8 MG disintegrating tablet Take by mouth. 03/24/21   [provider]  ONE TOUCH LANCETS MISC Use to check blood sugar 8 time(s) daily 07/31/18   [provider]  Glory Rosebush VERIO test strip SMARTSIG:Via Meter 8 Times Daily 12/04/20   [provider]  oxyCODONE-acetaminophen (PERCOCET) 5-325 MG tablet Take 1 tablet by mouth every 4 (four) hours as needed for severe pain. 11/08/20 11/08/21  Baglia, Corrina, PA-C  triamcinolone cream (KENALOG) 0.1 % Apply topically 2 (two) times daily. 02/23/21   [provider]  triamcinolone cream (KENALOG) 0.1 % Apply topically. Patient not taking: Reported on 06/13/2021 02/23/21   [provider]    Allergies    Fexofenadine and Latex  Review of Systems   Review of Systems  Constitutional:  Positive for fatigue. Negative for chills and fever.  HENT:  Positive for congestion. Negative for ear pain and sore throat.   Eyes:  Negative for pain and visual disturbance.  Respiratory:  Positive for cough. Negative for shortness of breath.   Cardiovascular:  Negative for chest pain and palpitations.  Gastrointestinal:  Positive for diarrhea, nausea and vomiting. Negative for abdominal pain.  Genitourinary:  Negative for dysuria and hematuria.  Musculoskeletal:  Negative for arthralgias and back pain.  Skin:  Negative for color change and rash.  Neurological:  Positive for weakness. Negative for seizures and syncope.  All other systems reviewed and are negative.  Physical Exam Updated Vital Signs BP (!) 189/84    Pulse 87    Temp 98.3 F (36.8 C) (Oral)    Resp 15    SpO2 98%   Physical Exam Vitals and nursing note reviewed.  Constitutional:      General: He is not in acute distress.    Appearance: Normal appearance. He is well-developed.  HENT:     Head:  Normocephalic and atraumatic.     Mouth/Throat:     Mouth: Mucous membranes are dry.  Eyes:     Extraocular Movements: Extraocular movements intact.     Conjunctiva/sclera: Conjunctivae normal.     Pupils: Pupils are equal, round, and reactive to light.  Cardiovascular:     Rate and Rhythm: Normal rate and regular rhythm.     Heart sounds: No murmur heard. Pulmonary:     Effort: Pulmonary effort is normal. No respiratory distress.     Breath sounds: Normal breath sounds. No wheezing or rales.  Abdominal:     Palpations: Abdomen is soft.     Tenderness: There is no abdominal tenderness.  Musculoskeletal:        General: No swelling.     Cervical back: Neck supple.  Skin:    General: Skin is warm and dry.     Capillary Refill: Capillary refill takes less than 2 seconds.  Neurological:     General: No focal deficit present.     Mental Status: He is alert and oriented to person, place, and time.  Psychiatric:        Mood and Affect: Mood normal.    ED Results / Procedures / Treatments   Labs (all labs ordered are listed, but only abnormal results are displayed) Labs Reviewed  BASIC METABOLIC PANEL - Abnormal; Notable for the following components:      Result Value   Glucose, Bld 174 (*)    BUN 81 (*)    Creatinine, Ser 6.80 (*)    GFR, Estimated 9 (*)    All other components within normal limits  HEPATIC FUNCTION PANEL - Abnormal; Notable for the following components:   ALT 91 (*)    All other components within normal limits  CBG MONITORING, ED - Abnormal; Notable for the following components:   Glucose-Capillary 162 (*)    All other components within normal limits  RESP PANEL BY RT-PCR (FLU A&B, COVID) ARPGX2  CBC  URINALYSIS, ROUTINE W REFLEX MICROSCOPIC    EKG EKG Interpretation  Date/Time:  Saturday October 01 2021 12:44:22 EST Ventricular Rate:  91 PR Interval:  186 QRS Duration: 82 QT Interval:  356 QTC Calculation: 437 R Axis:   70 Text  Interpretation: Normal sinus rhythm Cannot rule out Anterior infarct , age undetermined Abnormal ECG When compared with ECG of  16-Feb-2021 15:51, PREVIOUS ECG IS PRESENT Confirmed by Fredia Sorrow [phone]) on 10/01/2021 4:38:12 PM  Radiology DG Chest 2 View  Result Date: 10/01/2021 CLINICAL DATA:  Cough, congestion, COVID. EXAM: CHEST - 2 VIEW COMPARISON:  March 20, 2021 FINDINGS: The heart size and mediastinal contours are within normal limits. Both lungs are clear. 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 Degenerative joint changes of the spine are noted. IMPRESSION: No active cardiopulmonary disease. Electronically Signed   By: Abelardo Diesel M.D.   On: 10/01/2021 14:02    Procedures Procedures   Medications Ordered in ED Medications  sodium chloride flush (NS) 0.9 % injection 3 mL (has no administration in time range)  0.9 %  sodium chloride infusion (has no administration in time range)  sodium chloride 0.9 % bolus 250 mL (has no administration in time range)  ondansetron (ZOFRAN) injection 4 mg (has no administration in time range)    ED Course  I have reviewed the triage vital signs and the nursing notes.  Pertinent labs & imaging results that were available during my care of the patient were reviewed by me and considered in my medical decision making (see chart for details).    MDM Rules/Calculators/A&P                          Will repeat COVID influenza testing here disorders in her system.  Chest x-ray without acute findings.  No evidence of pneumonia.  Her GFR is 9 today.  Normally gets around 12 so little bit worse but still stage IV chronic kidney disease.  Potassium is normal at 4.0.  No evidence of any fluid or volume overload on the chest x-ray and patient is still making urine.  Patient not eating and drinking very well.  We will give some gentle hydration here today.  We will treat with Zofran for the nausea.  Patient at least needs Zofran  for home.  COVID is positive here.  Patient feeling better after gentle fluid hydration.  Has made some urine.  We will treat him with Zofran.  Stable for discharge home and follow-up with his doctors.  Final Clinical Impression(s) / ED Diagnoses Final diagnoses:  COVID  Stage 4 chronic kidney disease ([place])  Type 1 diabetes mellitus without complication [facility])    Rx / DC Orders ED Discharge Orders     None        Fredia Sorrow, MD 10/01/21 1722    Fredia Sorrow, MD 10/01/21 2206    Fredia Sorrow, MD 10/01/21 2210

## 2021-10-01 NOTE — ED Provider Notes (Signed)
Emergency Medicine Provider Triage Evaluation Note  Louis Peters , a 56 y.o. male  was evaluated in triage.  Pt complains of generalized weakness, diarrhea and vomiting.  Patient tested positive for COVID on Tuesday morning after symptoms began on Monday.  He reports since then he has been having persistent diarrhea and vomiting and has not been able to keep much of anything down.  Reports associated cough that has improved with cough medicine prescribed by PCP on virtual visit.  Reports feeling generally weak, difficulty getting up out of bed to get to the bathroom.  Denies chest pain or shortness of breath  Review of Systems  Positive: Generalized weakness, vomiting, diarrhea, cough Negative: Fevers, chest pain, shortness of breath  Physical Exam  BP 136/70 (BP Location: Left Arm)    Pulse 82    Temp 98.3 F (36.8 C) (Oral)    Resp 16    SpO2 97%  Gen:   Awake, no distress   Resp:  Normal effort, scattered rhonchi noted bilaterally MSK:   Moves extremities without difficulty  Other:  No focal abdominal tenderness  Medical Decision Making  Medically screening exam initiated at 1:14 PM.  Appropriate orders placed.  Louis Peters was informed that the remainder of the evaluation will be completed by another provider, this initial triage assessment does not replace that evaluation, and the importance of remaining in the ED until their evaluation is complete.     Jacqlyn Larsen, PA-C 10/01/21 1328    Dorie Rank, MD 10/01/21 [phone]

## 2021-10-01 NOTE — ED Notes (Signed)
Water given to pt 

## 2021-10-01 NOTE — ED Notes (Signed)
E-signature pad unavailable at time of pt discharge. This RN discussed discharge materials with pt and answered all pt questions. Pt stated understanding of discharge material. ? ?

## 2021-10-01 NOTE — ED Triage Notes (Signed)
+   home COVID test on Monday.  Did a virtual visit on Tuesday and has been in the bed all week with generalized weakness, vomiting, and diarrhea.  States blood sugar has been reading high at home.  States cough is better with his cough medicine that he got with the virtual visit.

## 2021-10-11 NOTE — Progress Notes (Shared)
[facility] Note  10/13/2021                        CHIEF COMPLAINT Patient presents for No chief complaint on file.    HISTORY OF PRESENT ILLNESS: Louis Peters is a 56 y.o. male who presents to the clinic today for:     Pt states vision is stable, he is on a new blood sugar monitor which is helping him keep his numbers under control  Referring physician: Bernerd Limbo, MD [address] [place],  [place] [id number]  HISTORICAL INFORMATION:   Selected notes from the [place] Referred by Dr. [name] for concern of retinal traction LEE: 09.25.19 (K. Hallahan) [BCVA: OD: CF_0  OS: 20/40] Ocular Hx-vitreous hemorrhage OS, traction detachment OD, PDR OU Previous eye docs: [name] Last visit w/ [name] was in 2016 -- was supposed to schedule surgery OS (PPV, MP, EL) but pt never followed up PMH-DM (last A1C: 11.1, takes humalog, lantus), HTN   CURRENT MEDICATIONS: No current outpatient medications on file. (Ophthalmic Drugs)   No current facility-administered medications for this visit. (Ophthalmic Drugs)   Current Outpatient Medications (Other)  Medication Sig   amLODipine (NORVASC) 10 MG tablet Take 10 mg by mouth every morning.   ammonium lactate (LAC-HYDRIN) 12 % lotion Apply 1 application topically daily as needed for dry skin.   aspirin EC 81 MG EC tablet Take 1 tablet (81 mg total) by mouth daily. Swallow whole. (Patient taking differently: Take 81 mg by mouth every morning. Swallow whole.)   atorvastatin (LIPITOR) 80 MG tablet Take 1 tablet (80 mg total) by mouth daily. (Patient taking differently: Take 80 mg by mouth every morning.)   Blood Glucose Monitoring Suppl ([place]) w/Device KIT by Does not apply route.   cholecalciferol (VITAMIN D3) 25 MCG (1000 UNIT) tablet Take 1,000 Units by mouth every morning.   clopidogrel (PLAVIX) 75 MG tablet Take 75 mg by mouth every  morning.   Continuous Blood Gluc Receiver (FREESTYLE LIBRE 2 READER) DEVI by Does not apply route.   Continuous Blood Gluc Sensor (FREESTYLE LIBRE 2 SENSOR) MISC by Does not apply route.   furosemide (LASIX) 40 MG tablet Take 1 tablet (40 mg total) by mouth daily.   Glucagon (BAQSIMI ONE PACK) 3 MG/DOSE POWD Place 3 mg into the nose once as needed (low blood sugar).   hydrALAZINE (APRESOLINE) 100 MG tablet Take 100 mg by mouth 3 (three) times daily.   hydrochlorothiazide (HYDRODIURIL) 25 MG tablet Take 25 mg by mouth daily.   insulin degludec (TRESIBA) 100 UNIT/ML SOPN FlexTouch Pen Inject 12 Units into the skin at bedtime.   insulin lispro (HUMALOG KWIKPEN) 200 UNIT/ML KwikPen Inject 5-8 Units into the skin See admin instructions. Inject 5 units subcutaneously before breakfast and lunch, inject 8 units before supper   Insulin Pen Needle 32G X 4 MM MISC Use with lantus and humalog 4 imes per day   labetalol (NORMODYNE) 100 MG tablet Take 100 mg by mouth 2 (two) times daily with breakfast and lunch.   losartan (COZAAR) 100 MG tablet TAKE 1 TABLET(100 MG) BY MOUTH DAILY   losartan (COZAAR) 50 MG tablet Take 1 tablet (50 mg total) by mouth daily.   metoCLOPramide (REGLAN) 5 MG tablet Take 1 tablet (5 mg total) by mouth 3 (three) times daily before meals for 5 days, THEN 1 tablet (5 mg  total) every 6 (six) hours as needed for up to 5 days for nausea. (Patient not taking: Reported on 03/20/2021)   ondansetron (ZOFRAN-ODT) 4 MG disintegrating tablet Take 1 tablet (4 mg total) by mouth every 8 (eight) hours as needed for nausea or vomiting.   ondansetron (ZOFRAN-ODT) 8 MG disintegrating tablet Take by mouth.   ONE TOUCH LANCETS MISC Use to check blood sugar 8 time(s) daily   ONETOUCH VERIO test strip SMARTSIG:Via Meter 8 Times Daily   oxyCODONE-acetaminophen (PERCOCET) 5-325 MG tablet Take 1 tablet by mouth every 4 (four) hours as needed for severe pain.   triamcinolone cream (KENALOG) 0.1 % Apply  topically 2 (two) times daily.   triamcinolone cream (KENALOG) 0.1 % Apply topically. (Patient not taking: Reported on 06/13/2021)   No current facility-administered medications for this visit. (Other)    REVIEW OF SYSTEMS:   ALLERGIES Allergies  Allergen Reactions   Fexofenadine Rash   Latex Rash    PAST MEDICAL HISTORY Past Medical History:  Diagnosis Date   Benign hypertension with chronic kidney disease, stage III ([place]) 03/10/2019   Chronic kidney disease (CKD), stage III (moderate) ([place]) 03/10/2019   Diabetes mellitus type 1, uncomplicated ([place]) 0/11/2120   Diabetes mellitus without complication ([place])    Diabetic retinopathy ([place])    PDR OU   Heat Injury 03/10/2019   Hypercholesterolemia 03/10/2019   Hypertension    Retinal detachment    TRD OD   Past Surgical History:  Procedure Laterality Date   AV FISTULA PLACEMENT Right 11/08/2020   Procedure: RIGHT ARM BRACHIOCEPHALIC ARTERIOVENOUS (AV) FISTULA CREATION;  Surgeon: Angelia Mould, MD;  Location: [facility] OR;  Service: Vascular;  Laterality: Right;   CATARACT EXTRACTION     EYE SURGERY     RETINAL DETACHMENT SURGERY      FAMILY HISTORY Family History  Problem Relation Age of Onset   Diabetes Maternal Grandmother    Diabetes Sister     SOCIAL HISTORY Social History   Tobacco Use   Smoking status: Never   Smokeless tobacco: Never  Vaping Use   Vaping Use: Never used  Substance Use Topics   Alcohol use: Yes    Comment: occ   Drug use: No         OPHTHALMIC EXAM:  Not recorded     IMAGING AND PROCEDURES  Imaging and Procedures for _0 @          ASSESSMENT/PLAN:    ICD-10-CM   1. 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 Essential  hypertension  I10     4. Hypertensive retinopathy of both eyes  H35.033     5. Combined forms of age-related cataract of both eyes  H25.813      1,2. Proliferative diabetic retinopathy OU  - A1C: 9.3 on 07.14.22, which is up from 8.9 on 04.20.22  - pt lost to f/u here from 02.12.2020 to 01.27.22  - OD -- chronic, macula-involving TRD -- 20/400 vision -- severe retinal ischemia  - OS -- vitreous traction with macular edema / retinoschisis of superior macula -- BCVA 20/25-2  - former pt of Dr. Zadie Rhine in 2016 -- records reviewed, at that time, OD was already detached with VA 20/400, OS was to undergo PPV/MP/EL, but pt  never had the surgery or followed up with [name]  - history of new onset vitreous hemorrhage following previous laser  - S/P PRP fill-in OS (10.01.19), fill-in (01.13.20)  - FA (01.13.20) shows active NVE OS, OD with minimal leakage   - VA 20/25 OS -- stable  - OCT shows chronic mac off TRD OD, and OS with tractional retinoschisis / macular edema of superior macula  - repeat FA 02.25.22 shows interval improvement in NVE OS -- minimal leakage along tractional schisis superior macula OS  - discussed possible need for surgery in the future  - OS may eventually need PPV / MP to relieve tractional retinoschisis, but with VA 20/25, will monitor for now  - F/U 4 months, sooner prn -- DFE, OCT  3. Chronic TRD OD-   - fovea involving chronic TRD OD -- periphery attached by laser PRP  - severe ischemia / arteriolar sclerosis OD -- low vision potential  - review of records for [name] indicate mac off TRD has been present since early 2016 -- now 3+ years  - discussed findings and poor prognosis  - do not recommend surgical intervention OD -- high risk, low benefit  - pt not interested in surgical intervention at this time  - monitor  4,5. Hypertensive retinopathy OU  - discussed importance of tight BP control  - monitor  6. Combined form age related cataracts OU-  - The symptoms  of cataract, surgical options, and treatments and risks were discussed with patient.  - discussed diagnosis and progression  - not yet visually significant  - monitor    Ophthalmic Meds Ordered this visit:  No orders of the defined types were placed in this encounter.       No follow-ups on file.  There are no Patient Instructions on file for this visit.  This document serves as a record of services personally performed by Gardiner Sleeper, MD, PhD. It was created on their behalf by San Jetty. Owens Shark, OA an ophthalmic technician. The creation of this record is the provider's dictation and/or activities during the visit.    Electronically signed by: San Jetty. Owens Shark, [place] 01.10.2023 12:54 PM   Gardiner Sleeper, M.D., Ph.D. Diseases & Surgery of the Retina and Vitreous [facility]: M myopia (nearsighted); A astigmatism; H hyperopia (farsighted); P presbyopia; Mrx spectacle prescription;  CTL contact lenses; OD right eye; OS left eye; OU both eyes  XT exotropia; ET esotropia; PEK punctate epithelial keratitis; PEE punctate epithelial erosions; DES dry eye syndrome; MGD meibomian gland dysfunction; ATs artificial tears; PFAT's preservative free artificial tears; [place] nuclear sclerotic cataract; PSC posterior subcapsular cataract; ERM epi-retinal membrane; PVD posterior vitreous detachment; RD retinal detachment; DM diabetes mellitus; DR diabetic retinopathy; NPDR non-proliferative diabetic retinopathy; PDR proliferative diabetic retinopathy; CSME clinically significant macular edema; DME diabetic macular edema; dbh dot blot hemorrhages; CWS cotton wool spot; POAG primary open angle glaucoma; C/D cup-to-disc ratio; HVF humphrey visual field; GVF goldmann visual field; OCT optical coherence tomography; IOP intraocular pressure; BRVO Branch retinal vein occlusion; CRVO central retinal vein occlusion; CRAO central retinal artery occlusion; BRAO branch retinal  artery occlusion; RT retinal tear; SB scleral buckle; PPV pars plana vitrectomy; VH Vitreous hemorrhage; PRP panretinal laser photocoagulation; IVK intravitreal kenalog; VMT vitreomacular traction; MH Macular hole;  NVD neovascularization of the disc; NVE neovascularization elsewhere; AREDS age related eye disease study; ARMD age related macular degeneration; POAG primary open angle glaucoma; EBMD epithelial/anterior basement membrane dystrophy;  ACIOL anterior chamber intraocular lens; IOL intraocular lens; PCIOL posterior chamber intraocular lens; Phaco/IOL phacoemulsification with intraocular lens placement; [place] photorefractive keratectomy; LASIK laser assisted in situ keratomileusis; HTN hypertension; DM diabetes mellitus; COPD chronic obstructive pulmonary disease

## 2021-10-13 ENCOUNTER — Inpatient Hospital Stay (HOSPITAL_COMMUNITY)
Admission: EM | Admit: 2021-10-13 | Discharge: 2021-10-16 | DRG: 682 | Disposition: A | Discharge status: Home / Self Care | Payer: Medicare Other | Attending: Internal Medicine | Admitting: Internal Medicine

## 2021-10-13 ENCOUNTER — Encounter (INDEPENDENT_AMBULATORY_CARE_PROVIDER_SITE_OTHER): Payer: Self-pay

## 2021-10-13 ENCOUNTER — Other Ambulatory Visit: Payer: Self-pay

## 2021-10-13 ENCOUNTER — Encounter (INDEPENDENT_AMBULATORY_CARE_PROVIDER_SITE_OTHER): Payer: Medicare Other | Admitting: Ophthalmology

## 2021-10-13 DIAGNOSIS — N179 Acute kidney failure, unspecified: Principal | ICD-10-CM | POA: Diagnosis present

## 2021-10-13 DIAGNOSIS — E113512 Type 2 diabetes mellitus with proliferative diabetic retinopathy with macular edema, left eye: Secondary | ICD-10-CM

## 2021-10-13 DIAGNOSIS — Z794 Long term (current) use of insulin: Secondary | ICD-10-CM

## 2021-10-13 DIAGNOSIS — E871 Hypo-osmolality and hyponatremia: Secondary | ICD-10-CM | POA: Diagnosis present

## 2021-10-13 DIAGNOSIS — Z8616 Personal history of COVID-19: Secondary | ICD-10-CM

## 2021-10-13 DIAGNOSIS — N185 Chronic kidney disease, stage 5: Secondary | ICD-10-CM | POA: Diagnosis present

## 2021-10-13 DIAGNOSIS — Z7902 Long term (current) use of antithrombotics/antiplatelets: Secondary | ICD-10-CM

## 2021-10-13 DIAGNOSIS — E1010 Type 1 diabetes mellitus with ketoacidosis without coma: Secondary | ICD-10-CM | POA: Diagnosis present

## 2021-10-13 DIAGNOSIS — I120 Hypertensive chronic kidney disease with stage 5 chronic kidney disease or end stage renal disease: Secondary | ICD-10-CM | POA: Diagnosis present

## 2021-10-13 DIAGNOSIS — E103593 Type 1 diabetes mellitus with proliferative diabetic retinopathy without macular edema, bilateral: Secondary | ICD-10-CM | POA: Diagnosis present

## 2021-10-13 DIAGNOSIS — D649 Anemia, unspecified: Secondary | ICD-10-CM

## 2021-10-13 DIAGNOSIS — D72828 Other elevated white blood cell count: Secondary | ICD-10-CM | POA: Diagnosis present

## 2021-10-13 DIAGNOSIS — E875 Hyperkalemia: Secondary | ICD-10-CM | POA: Diagnosis present

## 2021-10-13 DIAGNOSIS — I10 Essential (primary) hypertension: Secondary | ICD-10-CM | POA: Diagnosis present

## 2021-10-13 DIAGNOSIS — Z79899 Other long term (current) drug therapy: Secondary | ICD-10-CM

## 2021-10-13 DIAGNOSIS — E1022 Type 1 diabetes mellitus with diabetic chronic kidney disease: Secondary | ICD-10-CM | POA: Diagnosis present

## 2021-10-13 DIAGNOSIS — R7401 Elevation of levels of liver transaminase levels: Secondary | ICD-10-CM | POA: Diagnosis present

## 2021-10-13 DIAGNOSIS — E113521 Type 2 diabetes mellitus with proliferative diabetic retinopathy with traction retinal detachment involving the macula, right eye: Secondary | ICD-10-CM

## 2021-10-13 DIAGNOSIS — I9589 Other hypotension: Secondary | ICD-10-CM | POA: Diagnosis present

## 2021-10-13 DIAGNOSIS — R6510 Systemic inflammatory response syndrome (SIRS) of non-infectious origin without acute organ dysfunction: Secondary | ICD-10-CM | POA: Diagnosis present

## 2021-10-13 DIAGNOSIS — H25813 Combined forms of age-related cataract, bilateral: Secondary | ICD-10-CM

## 2021-10-13 DIAGNOSIS — Z833 Family history of diabetes mellitus: Secondary | ICD-10-CM | POA: Diagnosis not present

## 2021-10-13 DIAGNOSIS — H35033 Hypertensive retinopathy, bilateral: Secondary | ICD-10-CM

## 2021-10-13 DIAGNOSIS — E861 Hypovolemia: Secondary | ICD-10-CM | POA: Diagnosis present

## 2021-10-13 DIAGNOSIS — E7800 Pure hypercholesterolemia, unspecified: Secondary | ICD-10-CM | POA: Diagnosis present

## 2021-10-13 DIAGNOSIS — R7989 Other specified abnormal findings of blood chemistry: Secondary | ICD-10-CM

## 2021-10-13 DIAGNOSIS — Z7982 Long term (current) use of aspirin: Secondary | ICD-10-CM

## 2021-10-13 DIAGNOSIS — Z8673 Personal history of transient ischemic attack (TIA), and cerebral infarction without residual deficits: Secondary | ICD-10-CM | POA: Diagnosis not present

## 2021-10-13 DIAGNOSIS — D631 Anemia in chronic kidney disease: Secondary | ICD-10-CM | POA: Diagnosis present

## 2021-10-13 DIAGNOSIS — E1110 Type 2 diabetes mellitus with ketoacidosis without coma: Secondary | ICD-10-CM | POA: Diagnosis present

## 2021-10-13 DIAGNOSIS — I639 Cerebral infarction, unspecified: Secondary | ICD-10-CM | POA: Diagnosis present

## 2021-10-13 LAB — I-STAT VENOUS BLOOD GAS, ED
Acid-base deficit: 19 mmol/L — ABNORMAL HIGH (ref 0.0–2.0)
Bicarbonate: 8.4 mmol/L — ABNORMAL LOW (ref 20.0–28.0)
Calcium, Ion: 1.16 mmol/L (ref 1.15–1.40)
HCT: 35 % — ABNORMAL LOW (ref 39.0–52.0)
Hemoglobin: 11.9 g/dL — ABNORMAL LOW (ref 13.0–17.0)
O2 Saturation: 91 %
Potassium: 6.1 mmol/L — ABNORMAL HIGH (ref 3.5–5.1)
Sodium: 130 mmol/L — ABNORMAL LOW (ref 135–145)
TCO2: 9 mmol/L — ABNORMAL LOW (ref 22–32)
pCO2, Ven: 25.3 mmHg — ABNORMAL LOW (ref 44.0–60.0)
pH, Ven: 7.131 — CL (ref 7.250–7.430)
pO2, Ven: 77 mmHg — ABNORMAL HIGH (ref 32.0–45.0)

## 2021-10-13 LAB — CBC WITH DIFFERENTIAL/PLATELET
Abs Immature Granulocytes: 0.07 10*3/uL (ref 0.00–0.07)
Basophils Absolute: 0 10*3/uL (ref 0.0–0.1)
Basophils Relative: 0 %
Eosinophils Absolute: 0 10*3/uL (ref 0.0–0.5)
Eosinophils Relative: 0 %
HCT: 35.5 % — ABNORMAL LOW (ref 39.0–52.0)
Hemoglobin: 10.5 g/dL — ABNORMAL LOW (ref 13.0–17.0)
Immature Granulocytes: 1 %
Lymphocytes Relative: 5 %
Lymphs Abs: 0.7 10*3/uL (ref 0.7–4.0)
MCH: 29 pg (ref 26.0–34.0)
MCHC: 29.6 g/dL — ABNORMAL LOW (ref 30.0–36.0)
MCV: 98.1 fL (ref 80.0–100.0)
Monocytes Absolute: 0.2 10*3/uL (ref 0.1–1.0)
Monocytes Relative: 2 %
Neutro Abs: 12.1 10*3/uL — ABNORMAL HIGH (ref 1.7–7.7)
Neutrophils Relative %: 92 %
Platelets: 257 10*3/uL (ref 150–400)
RBC: 3.62 MIL/uL — ABNORMAL LOW (ref 4.22–5.81)
RDW: 13.6 % (ref 11.5–15.5)
WBC: 13.1 10*3/uL — ABNORMAL HIGH (ref 4.0–10.5)
nRBC: 0 % (ref 0.0–0.2)

## 2021-10-13 LAB — OSMOLALITY: Osmolality: 355 mOsm/kg (ref 275–295)

## 2021-10-13 LAB — COMPREHENSIVE METABOLIC PANEL
ALT: 131 U/L — ABNORMAL HIGH (ref 0–44)
AST: 53 U/L — ABNORMAL HIGH (ref 15–41)
Albumin: 3.6 g/dL (ref 3.5–5.0)
Alkaline Phosphatase: 84 U/L (ref 38–126)
Anion gap: 27 — ABNORMAL HIGH (ref 5–15)
BUN: 90 mg/dL — ABNORMAL HIGH (ref 6–20)
CO2: 9 mmol/L — ABNORMAL LOW (ref 22–32)
Calcium: 9.7 mg/dL (ref 8.9–10.3)
Chloride: 96 mmol/L — ABNORMAL LOW (ref 98–111)
Creatinine, Ser: 6.64 mg/dL — ABNORMAL HIGH (ref 0.61–1.24)
GFR, Estimated: 9 mL/min — ABNORMAL LOW (ref >60)
Glucose, Bld: 886 mg/dL (ref 70–99)
Potassium: 6.3 mmol/L (ref 3.5–5.1)
Sodium: 132 mmol/L — ABNORMAL LOW (ref 135–145)
Total Bilirubin: 1.2 mg/dL (ref 0.3–1.2)
Total Protein: 5.8 g/dL — ABNORMAL LOW (ref 6.5–8.1)

## 2021-10-13 LAB — LIPASE, BLOOD: Lipase: 106 U/L — ABNORMAL HIGH (ref 11–51)

## 2021-10-13 LAB — ETHANOL: Alcohol, Ethyl (B): <10 mg/dL (ref <10)

## 2021-10-13 LAB — CBG MONITORING, ED
Glucose-Capillary: >600 mg/dL (ref 70–99)
Glucose-Capillary: >600 mg/dL (ref 70–99)

## 2021-10-13 LAB — BETA-HYDROXYBUTYRIC ACID: Beta-Hydroxybutyric Acid: 7.82 mmol/L — ABNORMAL HIGH (ref 0.05–0.27)

## 2021-10-13 MED ORDER — DEXTROSE 50 % IV SOLN: 0.0000 mL | INTRAVENOUS | Status: DC | PRN

## 2021-10-13 MED ORDER — INSULIN REGULAR(HUMAN) IN NACL 100-0.9 UT/100ML-% IV SOLN
INTRAVENOUS | Status: DC
  Administered 2021-10-13: 7 [IU]/h via INTRAVENOUS
  Filled 2021-10-13 (×2): qty 100

## 2021-10-13 MED ORDER — LACTATED RINGERS IV SOLN: INTRAVENOUS | Status: DC

## 2021-10-13 MED ORDER — LACTATED RINGERS IV BOLUS
20.0000 mL/kg | Freq: Once | INTRAVENOUS | Status: AC
  Administered 2021-10-13: 1588 mL via INTRAVENOUS

## 2021-10-13 MED ORDER — DEXTROSE IN LACTATED RINGERS 5 % IV SOLN: INTRAVENOUS | Status: DC

## 2021-10-13 MED ORDER — LORAZEPAM 2 MG/ML IJ SOLN: 1.0000 mg | Freq: Once | INTRAMUSCULAR | Status: DC

## 2021-10-13 NOTE — ED Triage Notes (Signed)
Pt BIB [facility] from home after patient called for weakness, N/V/D. EMS reports hypotension initial pressure of 56 palpated, increased to 100/80 with 241mL bolus. EMS also reports CBG of >600. Pt is Type I Diabetic. Pt alert and answering questions at this time.

## 2021-10-13 NOTE — ED Provider Notes (Signed)
[facility] EMERGENCY DEPARTMENT Provider Note   CSN: [id number] Arrival date & time: 10/13/21  2106     History  Chief Complaint  Patient presents with   Hyperglycemia    Louis Peters is a 56 y.o. male.  HPI Presents with his wife who assists with the history. Presents with ongoing weakness, diffusely, nausea, vomiting.  History is notable for COVID 2 weeks ago, CKD stage IV with placement of graft, but no ongoing dialysis.  Similarly the patient never recovered entirely from his COVID illness, has had progressive weakness general, nonfocal, with new developed nausea, vomiting, diarrhea.  No reported fever recently, no dyspnea, no confusion, no fall.  Patient has been unable to tolerate medication for intervention.    Home Medications Prior to Admission medications   Medication Sig Start Date End Date Taking? Authorizing Provider  amLODipine (NORVASC) 10 MG tablet Take 10 mg by mouth every morning. 01/16/17   [provider]  ammonium lactate (LAC-HYDRIN) 12 % lotion Apply 1 application topically daily as needed for dry skin. 10/01/18   [provider]  aspirin EC 81 MG EC tablet Take 1 tablet (81 mg total) by mouth daily. Swallow whole. Patient taking differently: Take 81 mg by mouth every morning. Swallow whole. 07/24/20   Geradine Girt, DO  atorvastatin (LIPITOR) 80 MG tablet Take 1 tablet (80 mg total) by mouth daily. Patient taking differently: Take 80 mg by mouth every morning. 07/24/20   Geradine Girt, DO  Blood Glucose Monitoring Suppl ([place]) w/Device KIT by Does not apply route. 07/31/18   [provider]  cholecalciferol (VITAMIN D3) 25 MCG (1000 UNIT) tablet Take 1,000 Units by mouth every morning.    [provider]  clopidogrel (PLAVIX) 75 MG tablet Take 75 mg by mouth every morning. 11/28/20   [provider]  Continuous Blood Gluc Receiver (FREESTYLE LIBRE 2 READER) DEVI by Does  not apply route. 04/08/20   [provider]  Continuous Blood Gluc Sensor (FREESTYLE LIBRE 2 SENSOR) MISC by Does not apply route. 04/14/21   [provider]  furosemide (LASIX) 40 MG tablet Take 1 tablet (40 mg total) by mouth daily. 01/07/21 01/07/22  Mercy Riding, MD  Glucagon (BAQSIMI ONE PACK) 3 MG/DOSE POWD Place 3 mg into the nose once as needed (low blood sugar).    [provider]  hydrALAZINE (APRESOLINE) 100 MG tablet Take 100 mg by mouth 3 (three) times daily.    [provider]  hydrochlorothiazide (HYDRODIURIL) 25 MG tablet Take 25 mg by mouth daily. 03/08/21   [provider]  insulin degludec (TRESIBA) 100 UNIT/ML SOPN FlexTouch Pen Inject 12 Units into the skin at bedtime. 04/30/19   [provider]  insulin lispro (HUMALOG KWIKPEN) 200 UNIT/ML KwikPen Inject 5-8 Units into the skin See admin instructions. Inject 5 units subcutaneously before breakfast and lunch, inject 8 units before supper    [provider]  Insulin Pen Needle 32G X 4 MM MISC Use with lantus and humalog 4 imes per day 07/26/17   [provider]  labetalol (NORMODYNE) 100 MG tablet Take 100 mg by mouth 2 (two) times daily with breakfast and lunch. 12/08/20   [provider]  losartan (COZAAR) 100 MG tablet TAKE 1 TABLET(100 MG) BY MOUTH DAILY 05/06/21   [provider]  losartan (COZAAR) 50 MG tablet Take 1 tablet (50 mg total) by mouth daily. 01/03/21 01/03/22  Mercy Riding, MD  metoCLOPramide (REGLAN) 5 MG tablet Take 1 tablet (5 mg total) by mouth 3 (three) times daily before meals for 5 days, THEN 1 tablet (5 mg total) every 6 (six) hours as needed for up to 5 days for nausea. Patient not taking: Reported on 03/20/2021 01/03/21 01/13/21  Mercy Riding, MD  ondansetron (ZOFRAN-ODT) 4 MG disintegrating tablet Take 1 tablet (4 mg total) by mouth every 8 (eight) hours as needed for nausea or vomiting. 10/01/21   Fredia Sorrow, MD  ondansetron  (ZOFRAN-ODT) 8 MG disintegrating tablet Take by mouth. 03/24/21   [provider]  ONE TOUCH LANCETS MISC Use to check blood sugar 8 time(s) daily 07/31/18   [provider]  Glory Rosebush VERIO test strip SMARTSIG:Via Meter 8 Times Daily 12/04/20   [provider]  oxyCODONE-acetaminophen (PERCOCET) 5-325 MG tablet Take 1 tablet by mouth every 4 (four) hours as needed for severe pain. 11/08/20 11/08/21  Baglia, Corrina, PA-C  triamcinolone cream (KENALOG) 0.1 % Apply topically 2 (two) times daily. 02/23/21   [provider]  triamcinolone cream (KENALOG) 0.1 % Apply topically. Patient not taking: Reported on 06/13/2021 02/23/21   [provider]      Allergies    Fexofenadine and Latex    Review of Systems   Review of Systems  Constitutional:        Per HPI, otherwise negative  HENT:         Per HPI, otherwise negative  Respiratory:         Per HPI, otherwise negative  Cardiovascular:        Per HPI, otherwise negative  Gastrointestinal:  Positive for nausea and vomiting.  Endocrine:       Negative aside from HPI  Genitourinary:        Neg aside from HPI   Musculoskeletal:        Per HPI, otherwise negative  Skin: Negative.   Neurological:  Positive for weakness. Negative for syncope.   Physical Exam Updated Vital Signs BP (!) 109/46 (BP Location: Left Arm)    Pulse (!) 104    Temp 98.7 F (37.1 C) (Oral)    Resp 16    Ht _0  (1.778 m)    Wt 79.4 kg    SpO2 99%    BMI 25.11 kg/m  Physical Exam Vitals and nursing note reviewed.  Constitutional:      Appearance: He is well-developed. He is ill-appearing and diaphoretic.  HENT:     Head: Normocephalic and atraumatic.  Eyes:     Conjunctiva/sclera: Conjunctivae normal.  Cardiovascular:     Rate and Rhythm: Regular rhythm. Tachycardia present.  Pulmonary:     Effort: Pulmonary effort is normal. No respiratory distress.     Breath sounds: No stridor.  Abdominal:     General: There is no  distension.     Tenderness: There is no abdominal tenderness. There is no guarding.  Skin:    General: Skin is warm.  Neurological:     Mental Status: He is alert and oriented to person, place, and time.    ED Results / Procedures / Treatments   Labs (all labs ordered are listed, but only abnormal results are displayed) Labs Reviewed  OSMOLALITY - Abnormal; Notable for the following components:      Result Value   Osmolality 355 (*)    All other components within normal limits  CBC WITH DIFFERENTIAL/PLATELET - Abnormal; Notable for the following components:   WBC 13.1 (*)  RBC 3.62 (*)    Hemoglobin 10.5 (*)    HCT 35.5 (*)    MCHC 29.6 (*)    Neutro Abs 12.1 (*)    All other components within normal limits  COMPREHENSIVE METABOLIC PANEL - Abnormal; Notable for the following components:   Sodium 132 (*)    Potassium 6.3 (*)    Chloride 96 (*)    CO2 9 (*)    Glucose, Bld 886 (*)    BUN 90 (*)    Creatinine, Ser 6.64 (*)    Total Protein 5.8 (*)    AST 53 (*)    ALT 131 (*)    GFR, Estimated 9 (*)    Anion gap 27 (*)    All other components within normal limits  LIPASE, BLOOD - Abnormal; Notable for the following components:   Lipase 106 (*)    All other components within normal limits  CBG MONITORING, ED - Abnormal; Notable for the following components:   Glucose-Capillary >600 (*)    All other components within normal limits  I-STAT VENOUS BLOOD GAS, ED - Abnormal; Notable for the following components:   pH, Ven 7.131 (*)    pCO2, Ven 25.3 (*)    pO2, Ven 77.0 (*)    Bicarbonate 8.4 (*)    TCO2 9 (*)    Acid-base deficit 19.0 (*)    Sodium 130 (*)    Potassium 6.1 (*)    HCT 35.0 (*)    Hemoglobin 11.9 (*)    All other components within normal limits  URINALYSIS, ROUTINE W REFLEX MICROSCOPIC  BETA-HYDROXYBUTYRIC ACID  BETA-HYDROXYBUTYRIC ACID  ETHANOL  CBG MONITORING, ED    EKG EKG Interpretation  Date/Time:  Thursday October 13 2021 21:19:42  EST Ventricular Rate:  106 PR Interval:  218 QRS Duration: 82 QT Interval:  323 QTC Calculation: 429 R Axis:   82 Text Interpretation: Sinus tachycardia Prolonged PR interval Borderline repolarization abnormality Abnormal ECG Confirmed by Carmin Muskrat [phone]) on 10/13/2021 9:33:43 PM  Radiology No results found.  Procedures Procedures    Medications Ordered in ED Medications  insulin regular, human (MYXREDLIN) 100 units/ 100 mL infusion (has no administration in time range)  dextrose 5 % in lactated ringers infusion (has no administration in time range)  dextrose 50 % solution 0-50 mL (has no administration in time range)  lactated ringers infusion (has no administration in time range)  LORazepam (ATIVAN) injection 1 mg (has no administration in time range)  lactated ringers bolus 1,588 mL (1,588 mLs Intravenous New Bag/Given 10/13/21 2151)    ED Course/ Medical Decision Making/ A&P With consideration of DKA versus sequelae of COVID versus bacteremia, sepsis, patient had labs initiated after initial interview.  Patient placed on continuous cardiac monitor, pulse oximetry. Cardiac 105 sinus tach abnormal Pulse ox 99% room air normal   Labs notable for glucose greater than 800.  Patient had received fluid resuscitation empirically, now with hyperglycemia, he will have addition of continuous insulin infusion.                         Medical Decision Making  Insulin-dependent adult male with recent COVID diagnosis presents with nausea, vomiting, weakness. Patient is is mentating appropriately, but uncomfortable, tachycardic, tachypneic on arrival as above concern for DKA among other things.  Labs here notable for acidosis, pH 7.1, anion gap greater than 20, and hyperglycemia greater than 800.  Patient required initiation of continuous fluids, continuous insulin for  critical illness. No early evidence for concurrent phenomenon such as pneumonia.  Ileitis vomiting, is a soft,  nonperitoneal abdomen.  IV antiemetics and Ativan was provided for nausea control.  Patient required admission to the stepdown unit for DKA after I discussed this case with our internal medicine colleagues.   CRITICAL CARE Performed by: Carmin Muskrat Total critical care time: 35 minutes Critical care time was exclusive of separately billable procedures and treating other patients. Critical care was necessary to treat or prevent imminent or life-threatening deterioration. Critical care was time spent personally by me on the following activities: development of treatment plan with patient and/or surrogate as well as nursing, discussions with consultants, evaluation of patient's response to treatment, examination of patient, obtaining history from patient or surrogate, ordering and performing treatments and interventions, ordering and review of laboratory studies, ordering and review of radiographic studies, pulse oximetry and re-evaluation of patient's condition.  Final Clinical Impression(s) / ED Diagnoses Final diagnoses:  Diabetic ketoacidosis without coma associated with type 1 diabetes mellitus ([place])     Carmin Muskrat, MD 10/13/21 2242

## 2021-10-14 ENCOUNTER — Encounter (HOSPITAL_COMMUNITY): Payer: Self-pay | Admitting: Family Medicine

## 2021-10-14 ENCOUNTER — Inpatient Hospital Stay (HOSPITAL_COMMUNITY): Payer: Medicare Other

## 2021-10-14 DIAGNOSIS — R7989 Other specified abnormal findings of blood chemistry: Secondary | ICD-10-CM | POA: Insufficient documentation

## 2021-10-14 DIAGNOSIS — E1010 Type 1 diabetes mellitus with ketoacidosis without coma: Secondary | ICD-10-CM

## 2021-10-14 DIAGNOSIS — I9589 Other hypotension: Secondary | ICD-10-CM

## 2021-10-14 DIAGNOSIS — R6510 Systemic inflammatory response syndrome (SIRS) of non-infectious origin without acute organ dysfunction: Secondary | ICD-10-CM | POA: Diagnosis present

## 2021-10-14 DIAGNOSIS — R7401 Elevation of levels of liver transaminase levels: Secondary | ICD-10-CM | POA: Diagnosis present

## 2021-10-14 DIAGNOSIS — D649 Anemia, unspecified: Secondary | ICD-10-CM | POA: Diagnosis present

## 2021-10-14 DIAGNOSIS — E861 Hypovolemia: Secondary | ICD-10-CM

## 2021-10-14 DIAGNOSIS — N185 Chronic kidney disease, stage 5: Secondary | ICD-10-CM

## 2021-10-14 DIAGNOSIS — N179 Acute kidney failure, unspecified: Principal | ICD-10-CM

## 2021-10-14 DIAGNOSIS — E875 Hyperkalemia: Secondary | ICD-10-CM

## 2021-10-14 LAB — HEMOGLOBIN A1C
Hgb A1c MFr Bld: 10.7 % — ABNORMAL HIGH (ref 4.8–5.6)
Mean Plasma Glucose: 260.39 mg/dL

## 2021-10-14 LAB — HEPATIC FUNCTION PANEL
ALT: 130 U/L — ABNORMAL HIGH (ref 0–44)
AST: 49 U/L — ABNORMAL HIGH (ref 15–41)
Albumin: 3.5 g/dL (ref 3.5–5.0)
Alkaline Phosphatase: 85 U/L (ref 38–126)
Bilirubin, Direct: 0.1 mg/dL (ref 0.0–0.2)
Indirect Bilirubin: 1.3 mg/dL — ABNORMAL HIGH (ref 0.3–0.9)
Total Bilirubin: 1.4 mg/dL — ABNORMAL HIGH (ref 0.3–1.2)
Total Protein: 5.8 g/dL — ABNORMAL LOW (ref 6.5–8.1)

## 2021-10-14 LAB — CBG MONITORING, ED
Glucose-Capillary: 117 mg/dL — ABNORMAL HIGH (ref 70–99)
Glucose-Capillary: 183 mg/dL — ABNORMAL HIGH (ref 70–99)
Glucose-Capillary: 240 mg/dL — ABNORMAL HIGH (ref 70–99)
Glucose-Capillary: 328 mg/dL — ABNORMAL HIGH (ref 70–99)
Glucose-Capillary: 358 mg/dL — ABNORMAL HIGH (ref 70–99)
Glucose-Capillary: 394 mg/dL — ABNORMAL HIGH (ref 70–99)
Glucose-Capillary: 443 mg/dL — ABNORMAL HIGH (ref 70–99)
Glucose-Capillary: 476 mg/dL — ABNORMAL HIGH (ref 70–99)
Glucose-Capillary: 525 mg/dL (ref 70–99)
Glucose-Capillary: 536 mg/dL (ref 70–99)
Glucose-Capillary: 548 mg/dL (ref 70–99)
Glucose-Capillary: 551 mg/dL (ref 70–99)
Glucose-Capillary: 66 mg/dL — ABNORMAL LOW (ref 70–99)
Glucose-Capillary: 70 mg/dL (ref 70–99)
Glucose-Capillary: 77 mg/dL (ref 70–99)
Glucose-Capillary: >600 mg/dL (ref 70–99)
Glucose-Capillary: >600 mg/dL (ref 70–99)
Glucose-Capillary: >600 mg/dL (ref 70–99)
Glucose-Capillary: >600 mg/dL (ref 70–99)
Glucose-Capillary: >600 mg/dL (ref 70–99)
Glucose-Capillary: >600 mg/dL (ref 70–99)

## 2021-10-14 LAB — URINALYSIS, ROUTINE W REFLEX MICROSCOPIC
Bilirubin Urine: NEGATIVE
Glucose, UA: >=500 mg/dL — AB
Hgb urine dipstick: NEGATIVE
Ketones, ur: NEGATIVE mg/dL
Leukocytes,Ua: NEGATIVE
Nitrite: NEGATIVE
Protein, ur: 100 mg/dL — AB
Specific Gravity, Urine: 1.011 (ref 1.005–1.030)
pH: 5 (ref 5.0–8.0)

## 2021-10-14 LAB — BASIC METABOLIC PANEL
Anion gap: 22 — ABNORMAL HIGH (ref 5–15)
Anion gap: 9 (ref 5–15)
Anion gap: 12 (ref 5–15)
Anion gap: 13 (ref 5–15)
BUN: 96 mg/dL — ABNORMAL HIGH (ref 6–20)
BUN: 96 mg/dL — ABNORMAL HIGH (ref 6–20)
BUN: 93 mg/dL — ABNORMAL HIGH (ref 6–20)
BUN: 91 mg/dL — ABNORMAL HIGH (ref 6–20)
CO2: 11 mmol/L — ABNORMAL LOW (ref 22–32)
CO2: 21 mmol/L — ABNORMAL LOW (ref 22–32)
CO2: 21 mmol/L — ABNORMAL LOW (ref 22–32)
CO2: 20 mmol/L — ABNORMAL LOW (ref 22–32)
Calcium: 8.9 mg/dL (ref 8.9–10.3)
Calcium: 8.5 mg/dL — ABNORMAL LOW (ref 8.9–10.3)
Calcium: 9.3 mg/dL (ref 8.9–10.3)
Calcium: 9.3 mg/dL (ref 8.9–10.3)
Chloride: 99 mmol/L (ref 98–111)
Chloride: 103 mmol/L (ref 98–111)
Chloride: 106 mmol/L (ref 98–111)
Chloride: 106 mmol/L (ref 98–111)
Creatinine, Ser: 7.12 mg/dL — ABNORMAL HIGH (ref 0.61–1.24)
Creatinine, Ser: 6.98 mg/dL — ABNORMAL HIGH (ref 0.61–1.24)
Creatinine, Ser: 6.55 mg/dL — ABNORMAL HIGH (ref 0.61–1.24)
Creatinine, Ser: 6.65 mg/dL — ABNORMAL HIGH (ref 0.61–1.24)
GFR, Estimated: 8 mL/min — ABNORMAL LOW (ref >60)
GFR, Estimated: 9 mL/min — ABNORMAL LOW (ref >60)
GFR, Estimated: 9 mL/min — ABNORMAL LOW (ref >60)
GFR, Estimated: 9 mL/min — ABNORMAL LOW (ref >60)
Glucose, Bld: 847 mg/dL (ref 70–99)
Glucose, Bld: 445 mg/dL — ABNORMAL HIGH (ref 70–99)
Glucose, Bld: 110 mg/dL — ABNORMAL HIGH (ref 70–99)
Glucose, Bld: 100 mg/dL — ABNORMAL HIGH (ref 70–99)
Potassium: 6.1 mmol/L — ABNORMAL HIGH (ref 3.5–5.1)
Potassium: 4.4 mmol/L (ref 3.5–5.1)
Potassium: 4.2 mmol/L (ref 3.5–5.1)
Potassium: 4.4 mmol/L (ref 3.5–5.1)
Sodium: 132 mmol/L — ABNORMAL LOW (ref 135–145)
Sodium: 133 mmol/L — ABNORMAL LOW (ref 135–145)
Sodium: 139 mmol/L (ref 135–145)
Sodium: 139 mmol/L (ref 135–145)

## 2021-10-14 LAB — BETA-HYDROXYBUTYRIC ACID
Beta-Hydroxybutyric Acid: 6.45 mmol/L — ABNORMAL HIGH (ref 0.05–0.27)
Beta-Hydroxybutyric Acid: 0.14 mmol/L (ref 0.05–0.27)
Beta-Hydroxybutyric Acid: 0.05 mmol/L (ref 0.05–0.27)
Beta-Hydroxybutyric Acid: 0.8 mmol/L — ABNORMAL HIGH (ref 0.05–0.27)

## 2021-10-14 LAB — CBC
HCT: 33.6 % — ABNORMAL LOW (ref 39.0–52.0)
Hemoglobin: 10 g/dL — ABNORMAL LOW (ref 13.0–17.0)
MCH: 28.7 pg (ref 26.0–34.0)
MCHC: 29.8 g/dL — ABNORMAL LOW (ref 30.0–36.0)
MCV: 96.6 fL (ref 80.0–100.0)
Platelets: 244 10*3/uL (ref 150–400)
RBC: 3.48 MIL/uL — ABNORMAL LOW (ref 4.22–5.81)
RDW: 13.6 % (ref 11.5–15.5)
WBC: 20.4 10*3/uL — ABNORMAL HIGH (ref 4.0–10.5)
nRBC: 0 % (ref 0.0–0.2)

## 2021-10-14 LAB — GLUCOSE, CAPILLARY
Glucose-Capillary: 285 mg/dL — ABNORMAL HIGH (ref 70–99)
Glucose-Capillary: 388 mg/dL — ABNORMAL HIGH (ref 70–99)

## 2021-10-14 LAB — HIV ANTIBODY (ROUTINE TESTING W REFLEX): HIV Screen 4th Generation wRfx: NONREACTIVE

## 2021-10-14 LAB — IRON AND TIBC
Iron: 74 ug/dL (ref 45–182)
Saturation Ratios: 35 % (ref 17.9–39.5)
TIBC: 213 ug/dL — ABNORMAL LOW (ref 250–450)
UIBC: 139 ug/dL

## 2021-10-14 LAB — FERRITIN: Ferritin: 623 ng/mL — ABNORMAL HIGH (ref 24–336)

## 2021-10-14 LAB — VITAMIN B12: Vitamin B-12: 627 pg/mL (ref 180–914)

## 2021-10-14 LAB — RETICULOCYTES
Immature Retic Fract: 9.3 % (ref 2.3–15.9)
RBC.: 3.47 MIL/uL — ABNORMAL LOW (ref 4.22–5.81)
Retic Count, Absolute: 37.1 10*3/uL (ref 19.0–186.0)
Retic Ct Pct: 1.1 % (ref 0.4–3.1)

## 2021-10-14 LAB — FOLATE: Folate: 25.6 ng/mL (ref >5.9)

## 2021-10-14 MED ORDER — DARBEPOETIN ALFA 60 MCG/0.3ML IJ SOSY
60.0000 ug | PREFILLED_SYRINGE | INTRAMUSCULAR | Status: DC
  Filled 2021-10-14: qty 0.3

## 2021-10-14 MED ORDER — CLOPIDOGREL BISULFATE 75 MG PO TABS
75.0000 mg | ORAL_TABLET | Freq: Every morning | ORAL | Status: DC
Start: 2021-10-14 — End: 2021-10-17
  Administered 2021-10-14 – 2021-10-16 (×3): 75 mg via ORAL
  Filled 2021-10-14 (×2): qty 1

## 2021-10-14 MED ORDER — INSULIN GLARGINE-YFGN 100 UNIT/ML ~~LOC~~ SOLN
7.0000 [IU] | SUBCUTANEOUS | Status: DC
  Filled 2021-10-14 (×2): qty 0.07

## 2021-10-14 MED ORDER — INSULIN ASPART 100 UNIT/ML IJ SOLN
0.0000 [IU] | Freq: Every day | INTRAMUSCULAR | Status: DC
  Administered 2021-10-14: 5 [IU] via SUBCUTANEOUS

## 2021-10-14 MED ORDER — DEXTROSE IN LACTATED RINGERS 5 % IV SOLN: INTRAVENOUS | Status: DC

## 2021-10-14 MED ORDER — INSULIN ASPART 100 UNIT/ML IJ SOLN
0.0000 [IU] | Freq: Three times a day (TID) | INTRAMUSCULAR | Status: DC
  Administered 2021-10-14: 5 [IU] via SUBCUTANEOUS
  Administered 2021-10-15: 9 [IU] via SUBCUTANEOUS

## 2021-10-14 MED ORDER — LACTATED RINGERS IV SOLN: INTRAVENOUS | Status: DC

## 2021-10-14 MED ORDER — HEPARIN SODIUM (PORCINE) 5000 UNIT/ML IJ SOLN
5000.0000 [IU] | Freq: Three times a day (TID) | INTRAMUSCULAR | Status: DC
  Administered 2021-10-14 – 2021-10-16 (×7): 5000 [IU] via SUBCUTANEOUS
  Filled 2021-10-14 (×7): qty 1

## 2021-10-14 MED ORDER — DEXTROSE 50 % IV SOLN: 0.0000 mL | INTRAVENOUS | Status: DC | PRN

## 2021-10-14 MED ORDER — ASPIRIN EC 81 MG PO TBEC
81.0000 mg | DELAYED_RELEASE_TABLET | Freq: Every morning | ORAL | Status: DC
  Administered 2021-10-14 – 2021-10-16 (×3): 81 mg via ORAL
  Filled 2021-10-14 (×3): qty 1

## 2021-10-14 NOTE — Discharge Instructions (Signed)
Glucose Products:  ReliOn™ glucose products raise low blood sugar fast. Tablets are free of fat, caffeine, sodium and gluten. They are portable and easy to carry, making it easier for people with diabetes to BE PREPARED for lows. ° °Glucose Tablets °Available in 6 flavors °• 10 ct...................................... $1.00 °• 50 ct...................................... $3.98 °Glucose Shot..................................$1.48 °Glucose Gel....................................$3.44 ° °Alcohol Swabs °Alcohol swabs are used to sterilize your injection site. All of our swabs are individually wrapped for maximum safety, convenience and moisture retention. °ReliOn™ Alcohol Swabs °• 100 ct Swabs..............................$1.00 °• 400 ct Swabs..............................$3.74 ° °Lancets °ReliOn™ offers three lancet options conveniently designed to work with almost every lancing device. Each features a protective disk, which guarantees sterility before testing. °ReliOn™ Lancets °• 100 ct Lancets $1.56 °• 200 ct Lancets $2.64 °Available in Ultra-Thin, Thin & Micro-Thin °ReliOn™ 2-IN-1 Lancing Device °• 50 ct Lancets..................................... $3.44 °Available in 30 gauge and 25 gauge °ReliOn™ Lancing Device....................$5.84 ° °Blood Glucose Monitors °ReliOn™ offers a full range of blood glucose testing options to provide an accurate, affordable °system that meets each person's unique needs and preferences. °Prime Meter....................................... $9.00 °Prime Test Strips °• 25 test strips.................................... $5.00 °• 50 test strips.................................... $9.00 °• 100 test strips.................................$17.88 °Premier BLU Meter  ………………………………  $18.98 °Premier Voice Meter  ……………………………….  $14.98 °Premier Test Strips °• 50 test strips.................................... $9.00 °• 100 test strips.................................$17.88 °Premier Compact Meter Kit   ………………………………  $19.44 °Kit includes: °• 50 test strips °• 10 lancets °• Lancing device °• Carry case ° °Ketone Test Strips °• 50 test strips  ……………………………………..  $6.64 ° °Human Insulin  °Novolin®/ReliOn™ (recombinant DNA origin) is manufactured for [employer] by [employer]o Nordisk °Novolin®/ReliOn™ Insulin* with Vial..........$24.88 °Available in N, R, 70/30 °Novolin®/ReliOn™ Insulin Pens*  ……………  $42.88 °Available in N, R, 70/30 ° °Insulin Delivery °ReliOn™ syringes and pen needles provide precision technology, comfort and accuracy in °insulin delivery at affordable prices. °ReliOn™ Pen Needles* °• 50 ct....................................................$9.00 °Available in 4mm, 6mm, 8mm & 12mm °ReliOn™ Insulin Syringes*  °• 100 ct ……………………………. $12.58 °Available in 29G, 30G & 31G (3/10cc, 1/2cc & 1cc units) °

## 2021-10-14 NOTE — Progress Notes (Signed)
Inpatient Diabetes Program Recommendations  AACE/ADA: New Consensus Statement on Inpatient Glycemic Control (2015)  Target Ranges:  Prepandial:   less than 140 mg/dL      Peak postprandial:   less than 180 mg/dL (1-2 hours)      Critically ill patients:  140 - 180 mg/dL   Lab Results  Component Value Date   GLUCAP 117 (H) 10/14/2021   HGBA1C 10.7 (H) 10/14/2021    Review of Glycemic Control  Diabetes history: DM type 1 Outpatient Diabetes medications: Tresiba 12 units, Humalog 5 units breakfast 5 units lunch, 8 units dinner Current orders for Inpatient glycemic control:  IV insulin/DKA  Inpatient Diabetes Program Recommendations:    At time of transition consider: Semglee 10 units Novolog 0-15 units tid + hs Novolog 3 units tid meal coverage if eating >50% of meals  Spoke with pt at bedside regarding A1c level of 10.7% on 10/14/21. Pt reports seeing Dr. Hartford Poli, Endocrinologist< for DM management and sees him every 3 months. Pt reports his baseline A1c is always 9-10%. Discussed current A1c. Discussed glucose and A1c goals. Explained how uncontrolled DM puts him at risk for several complications. Pt reports not taking meds for a week while he had COVID. Pt also reports not taking meds due to pharmacy not having it but he did not inquire for the pharmacy to check other [facility] locations for [place]. Explained to pt sick day guidelines to always take basal insulin even if he thinks he will have low blood sugars when not eating during periods of illness. Pt has follow up appt with Dr. Hartford Poli on 18th of this month. Encouraged follow up.  Tama Headings RN, MSN, BC-ADM Inpatient Diabetes Coordinator Team Pager [phone] (8a-5p)

## 2021-10-14 NOTE — Consult Note (Signed)
[facility]  HISTORY AND PHYSICAL  Louis Peters is an 56 y.o. male.    Chief Complaint: n/v and fatigue  HPI: Pt is a [name] with a PMH sig for HTN, HLD, DM I, and advanced CKD followed by Dr Carolin Sicks who is now seen in consultation at the request of Dr, Starla Link for eval and recs re: AKI on CKD V.    Pt reports he hasn't felt as well as usual since he got COVID over New Year's.  Has been more fatigued than usual.  Has been trying to eat/ drink and take his medications as normal.  He reports he's been out of his long-acting insulin for at least 2 days now.  Started having n/v and so was brought to ED.  Noted to have DKA with BS > 900 and Cr up to 7.12 from last values of 6.8 12/31 (was 5.15 03/20/21).  Initially hyperkalemic to 6.3--> now better at 4.2.  In this setting we are asked to see.    Pt feeling a lot better now.  Denies f/c, SOB, CP, LE edema.  No metallic taste, itching, jerking movements.  He has a R AVF.    PMH: Past Medical History:  Diagnosis Date   Benign hypertension with chronic kidney disease, stage III ([place]) 03/10/2019   Chronic kidney disease (CKD), stage III (moderate) ([place]) 03/10/2019   Diabetes mellitus type 1, uncomplicated ([place]) 12/04/1937   Diabetes mellitus without complication ([place])    Diabetic retinopathy ([place])    PDR OU   Heat Injury 03/10/2019   Hypercholesterolemia 03/10/2019   Hypertension    Retinal detachment    TRD OD   PSH: Past Surgical History:  Procedure Laterality Date   AV FISTULA PLACEMENT Right 11/08/2020   Procedure: RIGHT ARM BRACHIOCEPHALIC ARTERIOVENOUS (AV) FISTULA CREATION;  Surgeon: Angelia Mould, MD;  Location: [facility] OR;  Service: Vascular;  Laterality: Right;   CATARACT EXTRACTION     EYE SURGERY     RETINAL DETACHMENT SURGERY       Past Medical History:  Diagnosis Date   Benign hypertension with chronic kidney disease, stage III ([place]) 03/10/2019   Chronic kidney disease (CKD), stage III (moderate) ([place]) 03/10/2019    Diabetes mellitus type 1, uncomplicated ([place]) 9/0/2409   Diabetes mellitus without complication ([place])    Diabetic retinopathy ([place])    PDR OU   Heat Injury 03/10/2019   Hypercholesterolemia 03/10/2019   Hypertension    Retinal detachment    TRD OD    Medications:  Scheduled:  aspirin EC  81 mg Oral q morning   clopidogrel  75 mg Oral q morning   heparin  5,000 Units Subcutaneous Q8H   insulin aspart  0-5 Units Subcutaneous QHS   insulin aspart  0-9 Units Subcutaneous TID WC   insulin glargine-yfgn  7 Units Subcutaneous Q24H    (Not in a hospital admission)   ALLERGIES:   Allergies  Allergen Reactions   Fexofenadine Rash   Latex Rash    FAM HX: Family History  Problem Relation Age of Onset   Diabetes Maternal Grandmother    Diabetes Sister     Social History:   reports that he has never smoked. He has never used smokeless tobacco. He reports current alcohol use. He reports that he does not use drugs.  ROS: ROS: all other systems reviewed and are negative except as per HPI  Blood pressure 136/70, pulse 98, temperature 99.9 F (37.7 C), temperature source Oral, resp. rate  18, height 5\' 10"  (1.778 m), weight 79.4 kg, SpO2 98 %. PHYSICAL EXAM: Physical Exam GEN: NAD, sitting on edge of bed HEENT EOMI PERRL NECK no JVD PULM clear CV RRR ABD soft EXT no LE edema NEURO no asterixis ACCESS: R AVF + T/B, a little tortuous   Results for orders placed or performed during the hospital encounter of 10/13/21 (from the past 48 hour(s))  CBG monitoring, ED     Status: Abnormal   Collection Time: 10/13/21  9:21 PM  Result Value Ref Range   Glucose-Capillary >600 (HH) 70 - 99 mg/dL    Comment: Glucose reference range applies only to samples taken after fasting for at least 8 hours.  Osmolality     Status: Abnormal   Collection Time: 10/13/21  9:38 PM  Result Value Ref Range   Osmolality 355 (HH) 275 - 295 mOsm/kg    Comment: CRITICAL RESULT CALLED TO, READ BACK BY AND  VERIFIED WITH: RN Mikal Plane [phone] @[id number]  THANEY Performed at [facility] [address] [place], [place] [address]   CBC with Differential (PNL)     Status: Abnormal   Collection Time: 10/13/21  9:38 PM  Result Value Ref Range   WBC 13.1 (H) 4.0 - 10.5 K/uL   RBC 3.62 (L) 4.22 - 5.81 MIL/uL   Hemoglobin 10.5 (L) 13.0 - 17.0 g/dL   HCT 35.5 (L) 39.0 - 52.0 %   MCV 98.1 80.0 - 100.0 fL   MCH 29.0 26.0 - 34.0 pg   MCHC 29.6 (L) 30.0 - 36.0 g/dL   RDW 13.6 11.5 - 15.5 %   Platelets 257 150 - 400 K/uL   nRBC 0.0 0.0 - 0.2 %   Neutrophils Relative % 92 %   Neutro Abs 12.1 (H) 1.7 - 7.7 K/uL   Lymphocytes Relative 5 %   Lymphs Abs 0.7 0.7 - 4.0 K/uL   Monocytes Relative 2 %   Monocytes Absolute 0.2 0.1 - 1.0 K/uL   Eosinophils Relative 0 %   Eosinophils Absolute 0.0 0.0 - 0.5 K/uL   Basophils Relative 0 %   Basophils Absolute 0.0 0.0 - 0.1 K/uL   Immature Granulocytes 1 %   Abs Immature Granulocytes 0.07 0.00 - 0.07 K/uL    Comment: Performed at [facility], [place] [address], [place], [place] [address]  Beta-hydroxybutyric acid     Status: Abnormal   Collection Time: 10/13/21  9:38 PM  Result Value Ref Range   Beta-Hydroxybutyric Acid 7.82 (H) 0.05 - 0.27 mmol/L    Comment: RESULTS CONFIRMED BY MANUAL DILUTION Performed at [facility] [address] [place], [place] [address]   Ethanol     Status: None   Collection Time: 10/13/21  9:38 PM  Result Value Ref Range   Alcohol, Ethyl (B) <10 <10 mg/dL    Comment: (NOTE) Lowest detectable limit for serum alcohol is 10 mg/dL.  For medical purposes only. Performed at [facility], [place] [address], [place], [place] [address]   Comprehensive metabolic panel     Status: Abnormal   Collection Time: 10/13/21  9:38 PM  Result Value Ref Range   Sodium 132 (L) 135 - 145 mmol/L   Potassium 6.3 (HH) 3.5 - 5.1 mmol/L    Comment: NO VISIBLE HEMOLYSIS CRITICAL RESULT CALLED TO, READ BACK BY AND VERIFIED  WITH: Mikal Plane, RN 2229 10/13/21 A GASKINS    Chloride 96 (L) 98 - 111 mmol/L   CO2  9 (L) 22 - 32 mmol/L   Glucose, Bld 886 (HH) 70 - 99 mg/dL    Comment: Glucose reference range applies only to samples taken after fasting for at least 8 hours. CRITICAL RESULT CALLED TO, READ BACK BY AND VERIFIED WITH: Mikal Plane, RN 2229 10/13/21 A GASKINS    BUN 90 (H) 6 - 20 mg/dL   Creatinine, Ser 6.64 (H) 0.61 - 1.24 mg/dL   Calcium 9.7 8.9 - 10.3 mg/dL   Total Protein 5.8 (L) 6.5 - 8.1 g/dL   Albumin 3.6 3.5 - 5.0 g/dL   AST 53 (H) 15 - 41 U/L   ALT 131 (H) 0 - 44 U/L   Alkaline Phosphatase 84 38 - 126 U/L   Total Bilirubin 1.2 0.3 - 1.2 mg/dL   GFR, Estimated 9 (L) >60 mL/min    Comment: (NOTE) Calculated using the CKD-EPI Creatinine Equation (2021)    Anion gap 27 (H) 5 - 15    Comment: Electrolytes repeated to confirm. Performed at [facility], [place] [address] [place], [place] [address]   Lipase, blood     Status: Abnormal   Collection Time: 10/13/21  9:38 PM  Result Value Ref Range   Lipase 106 (H) 11 - 51 U/L    Comment: Performed at [facility] [address], [place], [place] [address]  I-Stat venous blood gas, ED     Status: Abnormal   Collection Time: 10/13/21  9:47 PM  Result Value Ref Range   pH, Ven 7.131 (LL) 7.250 - 7.430   pCO2, Ven 25.3 (L) 44.0 - 60.0 mmHg   pO2, Ven 77.0 (H) 32.0 - 45.0 mmHg   Bicarbonate 8.4 (L) 20.0 - 28.0 mmol/L   TCO2 9 (L) 22 - 32 mmol/L   O2 Saturation 91.0 %   Acid-base deficit 19.0 (H) 0.0 - 2.0 mmol/L   Sodium 130 (L) 135 - 145 mmol/L   Potassium 6.1 (H) 3.5 - 5.1 mmol/L   Calcium, Ion 1.16 1.15 - 1.40 mmol/L   HCT 35.0 (L) 39.0 - 52.0 %   Hemoglobin 11.9 (L) 13.0 - 17.0 g/dL   Sample type VENOUS    Comment NOTIFIED PHYSICIAN   CBG monitoring, ED     Status: Abnormal   Collection Time: 10/13/21 11:46 PM  Result Value Ref Range   Glucose-Capillary >600 (HH) 70 - 99 mg/dL    Comment: Glucose reference range applies  only to samples taken after fasting for at least 8 hours.  CBG monitoring, ED     Status: Abnormal   Collection Time: 10/14/21 12:27 AM  Result Value Ref Range   Glucose-Capillary >600 (HH) 70 - 99 mg/dL    Comment: Glucose reference range applies only to samples taken after fasting for at least 8 hours.  CBG monitoring, ED     Status: Abnormal   Collection Time: 10/14/21 12:56 AM  Result Value Ref Range   Glucose-Capillary >600 (HH) 70 - 99 mg/dL    Comment: Glucose reference range applies only to samples taken after fasting for at least 8 hours.  HIV Antibody (routine testing w rflx)     Status: None   Collection Time: 10/14/21  1:34 AM  Result Value Ref Range   HIV Screen 4th Generation wRfx Non Reactive Non Reactive    Comment: Performed at [facility], [place] [address], [place], [place] [address]  Basic metabolic panel     Status: Abnormal   Collection Time: 10/14/21  1:34 AM  Result Value Ref Range   Sodium 132 (L) 135 - 145 mmol/L   Potassium 6.1 (H) 3.5 - 5.1 mmol/L   Chloride 99 98 - 111 mmol/L   CO2 11 (L) 22 - 32 mmol/L   Glucose, Bld 847 (HH) 70 - 99 mg/dL    Comment: Glucose reference range applies only to samples taken after fasting for at least 8 hours. CRITICAL RESULT CALLED TO, READ BACK BY AND VERIFIED WITH: Edison Pace, RN [phone] 10/14/21 A GASKINS    BUN 96 (H) 6 - 20 mg/dL   Creatinine, Ser 7.12 (H) 0.61 - 1.24 mg/dL   Calcium 8.9 8.9 - 10.3 mg/dL   GFR, Estimated 8 (L) >60 mL/min    Comment: (NOTE) Calculated using the CKD-EPI Creatinine Equation (2021)    Anion gap 22 (H) 5 - 15    Comment: Electrolytes repeated to confirm. Performed at [facility], [place] [address], [place], [place] [address]   Beta-hydroxybutyric acid     Status: Abnormal   Collection Time: 10/14/21  1:34 AM  Result Value Ref Range   Beta-Hydroxybutyric Acid 6.45 (H) 0.05 - 0.27 mmol/L    Comment: RESULTS CONFIRMED BY MANUAL DILUTION Performed at [facility] [address], [place], [place] [address]   Hemoglobin A1c     Status: Abnormal   Collection Time: 10/14/21  1:34 AM  Result Value Ref Range   Hgb A1c MFr Bld 10.7 (H) 4.8 - 5.6 %    Comment: (NOTE) Pre diabetes:          5.7%-6.4%  Diabetes:              >6.4%  Glycemic control for   <7.0% adults with diabetes    Mean Plasma Glucose 260.39 mg/dL    Comment: Performed at [facility] [address] [place], [place] [address]  Vitamin B12     Status: None   Collection Time: 10/14/21  1:34 AM  Result Value Ref Range   Vitamin B-12 627 180 - 914 pg/mL    Comment: (NOTE) This assay is not validated for testing neonatal or myeloproliferative syndrome specimens for Vitamin B12 levels. Performed at [facility], [place] [address] [place], [place] [address]   Folate     Status: None   Collection Time: 10/14/21  1:34 AM  Result Value Ref Range   Folate 25.6 >5.9 ng/mL    Comment: Performed at [facility] [address], [place], [place] [address]  Iron and TIBC     Status: Abnormal   Collection Time: 10/14/21  1:34 AM  Result Value Ref Range   Iron 74 45 - 182 ug/dL   TIBC 213 (L) 250 - 450 ug/dL   Saturation Ratios 35 17.9 - 39.5 %   UIBC 139 ug/dL    Comment: Performed at [facility], [place] [address], [place], [place] [address]  Ferritin     Status: Abnormal   Collection Time: 10/14/21  1:34 AM  Result Value Ref Range   Ferritin 623 (H) 24 - 336 ng/mL    Comment: Performed at [facility], [place] [address] [place], [place] [address]  Reticulocytes     Status: Abnormal   Collection Time: 10/14/21  1:34 AM  Result Value Ref Range   Retic Ct Pct 1.1 0.4 - 3.1 %   RBC. 3.47 (L) 4.22 - 5.81 MIL/uL   Retic Count, Absolute 37.1  19.0 - 186.0 K/uL   Immature Retic Fract 9.3 2.3 - 15.9 %    Comment: Performed at [facility], [place] [address] [place], [place] [address]  Hepatic function panel     Status: Abnormal   Collection Time: 10/14/21  1:34 AM   Result Value Ref Range   Total Protein 5.8 (L) 6.5 - 8.1 g/dL   Albumin 3.5 3.5 - 5.0 g/dL   AST 49 (H) 15 - 41 U/L   ALT 130 (H) 0 - 44 U/L   Alkaline Phosphatase 85 38 - 126 U/L   Total Bilirubin 1.4 (H) 0.3 - 1.2 mg/dL   Bilirubin, Direct 0.1 0.0 - 0.2 mg/dL   Indirect Bilirubin 1.3 (H) 0.3 - 0.9 mg/dL    Comment: Performed at [facility] [address] [place], [place] [address]  CBC     Status: Abnormal   Collection Time: 10/14/21  1:34 AM  Result Value Ref Range   WBC 20.4 (H) 4.0 - 10.5 K/uL   RBC 3.48 (L) 4.22 - 5.81 MIL/uL   Hemoglobin 10.0 (L) 13.0 - 17.0 g/dL   HCT 33.6 (L) 39.0 - 52.0 %   MCV 96.6 80.0 - 100.0 fL   MCH 28.7 26.0 - 34.0 pg   MCHC 29.8 (L) 30.0 - 36.0 g/dL   RDW 13.6 11.5 - 15.5 %   Platelets 244 150 - 400 K/uL   nRBC 0.0 0.0 - 0.2 %    Comment: Performed at [facility], [place] [address], [place], [place] [address]  CBG monitoring, ED     Status: Abnormal   Collection Time: 10/14/21  1:34 AM  Result Value Ref Range   Glucose-Capillary >600 (HH) 70 - 99 mg/dL    Comment: Glucose reference range applies only to samples taken after fasting for at least 8 hours.  CBG monitoring, ED     Status: Abnormal   Collection Time: 10/14/21  2:20 AM  Result Value Ref Range   Glucose-Capillary >600 (HH) 70 - 99 mg/dL    Comment: Glucose reference range applies only to samples taken after fasting for at least 8 hours.  CBG monitoring, ED     Status: Abnormal   Collection Time: 10/14/21  3:09 AM  Result Value Ref Range   Glucose-Capillary >600 (HH) 70 - 99 mg/dL    Comment: Glucose reference range applies only to samples taken after fasting for at least 8 hours.  CBG monitoring, ED     Status: Abnormal   Collection Time: 10/14/21  3:44 AM  Result Value Ref Range   Glucose-Capillary >600 (HH) 70 - 99 mg/dL    Comment: Glucose reference range applies only to samples taken after fasting for at least 8 hours.  CBG monitoring, ED     Status: Abnormal    Collection Time: 10/14/21  4:21 AM  Result Value Ref Range   Glucose-Capillary 536 (HH) 70 - 99 mg/dL    Comment: Glucose reference range applies only to samples taken after fasting for at least 8 hours.   Comment 1 Notify RN    Comment 2 Document in Chart   CBG monitoring, ED     Status: Abnormal   Collection Time: 10/14/21  4:58 AM  Result Value Ref Range   Glucose-Capillary 551 (HH) 70 - 99 mg/dL    Comment: Glucose reference range applies only to samples taken after fasting for at least 8 hours.   Comment 1 Document in Chart  CBG monitoring, ED     Status: Abnormal   Collection Time: 10/14/21  5:40 AM  Result Value Ref Range   Glucose-Capillary 548 (HH) 70 - 99 mg/dL    Comment: Glucose reference range applies only to samples taken after fasting for at least 8 hours.   Comment 1 Notify RN    Comment 2 Document in Chart   CBG monitoring, ED     Status: Abnormal   Collection Time: 10/14/21  6:12 AM  Result Value Ref Range   Glucose-Capillary 525 (HH) 70 - 99 mg/dL    Comment: Glucose reference range applies only to samples taken after fasting for at least 8 hours.   Comment 1 Notify RN    Comment 2 Document in Chart   CBG monitoring, ED     Status: Abnormal   Collection Time: 10/14/21  6:50 AM  Result Value Ref Range   Glucose-Capillary 443 (H) 70 - 99 mg/dL    Comment: Glucose reference range applies only to samples taken after fasting for at least 8 hours.  CBG monitoring, ED     Status: Abnormal   Collection Time: 10/14/21  7:19 AM  Result Value Ref Range   Glucose-Capillary 476 (H) 70 - 99 mg/dL    Comment: Glucose reference range applies only to samples taken after fasting for at least 8 hours.  Basic metabolic panel     Status: Abnormal   Collection Time: 10/14/21  7:50 AM  Result Value Ref Range   Sodium 133 (L) 135 - 145 mmol/L   Potassium 4.4 3.5 - 5.1 mmol/L   Chloride 103 98 - 111 mmol/L   CO2 21 (L) 22 - 32 mmol/L   Glucose, Bld 445 (H) 70 - 99 mg/dL     Comment: Glucose reference range applies only to samples taken after fasting for at least 8 hours.   BUN 96 (H) 6 - 20 mg/dL   Creatinine, Ser 6.98 (H) 0.61 - 1.24 mg/dL   Calcium 8.5 (L) 8.9 - 10.3 mg/dL   GFR, Estimated 9 (L) >60 mL/min    Comment: (NOTE) Calculated using the CKD-EPI Creatinine Equation (2021)    Anion gap 9 5 - 15    Comment: Performed at [facility] [address] [place], [place] [address]  Beta-hydroxybutyric acid     Status: None   Collection Time: 10/14/21  7:50 AM  Result Value Ref Range   Beta-Hydroxybutyric Acid 0.14 0.05 - 0.27 mmol/L    Comment: Performed at [facility] [address] [place], [place] [address]  CBG monitoring, ED     Status: Abnormal   Collection Time: 10/14/21  7:56 AM  Result Value Ref Range   Glucose-Capillary 394 (H) 70 - 99 mg/dL    Comment: Glucose reference range applies only to samples taken after fasting for at least 8 hours.  Urinalysis, Routine w reflex microscopic     Status: Abnormal   Collection Time: 10/14/21  8:15 AM  Result Value Ref Range   Color, Urine YELLOW YELLOW   APPearance HAZY (A) CLEAR   Specific Gravity, Urine 1.011 1.005 - 1.030   pH 5.0 5.0 - 8.0   Glucose, UA >=500 (A) NEGATIVE mg/dL   Hgb urine dipstick NEGATIVE NEGATIVE   Bilirubin Urine NEGATIVE NEGATIVE   Ketones, ur NEGATIVE NEGATIVE mg/dL   Protein, ur 100 (A) NEGATIVE mg/dL   Nitrite NEGATIVE NEGATIVE   Leukocytes,Ua NEGATIVE NEGATIVE   RBC / HPF 0-5 0 -  5 RBC/hpf   WBC, UA 6-10 0 - 5 WBC/hpf   Bacteria, UA RARE (A) NONE SEEN   Squamous Epithelial / LPF 0-5 0 - 5   Mucus PRESENT    Hyaline Casts, UA PRESENT     Comment: Performed at [facility], [place] [address], [place], [place] [address]  CBG monitoring, ED     Status: Abnormal   Collection Time: 10/14/21  8:30 AM  Result Value Ref Range   Glucose-Capillary 358 (H) 70 - 99 mg/dL    Comment: Glucose reference range applies only to samples taken after fasting for  at least 8 hours.  CBG monitoring, ED     Status: Abnormal   Collection Time: 10/14/21  9:16 AM  Result Value Ref Range   Glucose-Capillary 328 (H) 70 - 99 mg/dL    Comment: Glucose reference range applies only to samples taken after fasting for at least 8 hours.  CBG monitoring, ED     Status: Abnormal   Collection Time: 10/14/21 10:20 AM  Result Value Ref Range   Glucose-Capillary 240 (H) 70 - 99 mg/dL    Comment: Glucose reference range applies only to samples taken after fasting for at least 8 hours.  CBG monitoring, ED     Status: Abnormal   Collection Time: 10/14/21 11:35 AM  Result Value Ref Range   Glucose-Capillary 183 (H) 70 - 99 mg/dL    Comment: Glucose reference range applies only to samples taken after fasting for at least 8 hours.  CBG monitoring, ED     Status: Abnormal   Collection Time: 10/14/21 12:40 PM  Result Value Ref Range   Glucose-Capillary 117 (H) 70 - 99 mg/dL    Comment: Glucose reference range applies only to samples taken after fasting for at least 8 hours.  Beta-hydroxybutyric acid     Status: None   Collection Time: 10/14/21  1:00 PM  Result Value Ref Range   Beta-Hydroxybutyric Acid 0.05 0.05 - 0.27 mmol/L    Comment: Performed at [facility], [place] [address] [place], [place] [address]  Basic metabolic panel     Status: Abnormal   Collection Time: 10/14/21  1:00 PM  Result Value Ref Range   Sodium 139 135 - 145 mmol/L   Potassium 4.2 3.5 - 5.1 mmol/L   Chloride 106 98 - 111 mmol/L   CO2 21 (L) 22 - 32 mmol/L   Glucose, Bld 110 (H) 70 - 99 mg/dL    Comment: Glucose reference range applies only to samples taken after fasting for at least 8 hours.   BUN 93 (H) 6 - 20 mg/dL   Creatinine, Ser 6.55 (H) 0.61 - 1.24 mg/dL   Calcium 9.3 8.9 - 10.3 mg/dL   GFR, Estimated 9 (L) >60 mL/min    Comment: (NOTE) Calculated using the CKD-EPI Creatinine Equation (2021)    Anion gap 12 5 - 15    Comment: Performed at [facility] [address], [place], [place] [address]  CBG monitoring, ED     Status: None   Collection Time: 10/14/21  1:55 PM  Result Value Ref Range   Glucose-Capillary 77 70 - 99 mg/dL    Comment: Glucose reference range applies only to samples taken after fasting for at least 8 hours.  CBG monitoring, ED     Status: Abnormal   Collection Time: 10/14/21  2:43 PM  Result Value Ref Range   Glucose-Capillary 66 (L) 70 - 99 mg/dL  Comment: Glucose reference range applies only to samples taken after fasting for at least 8 hours.  CBG monitoring, ED     Status: None   Collection Time: 10/14/21  3:41 PM  Result Value Ref Range   Glucose-Capillary 70 70 - 99 mg/dL    Comment: Glucose reference range applies only to samples taken after fasting for at least 8 hours.    US Abdomen Limited RUQ (LIVER/GB)  Result Date: 10/14/2021 CLINICAL DATA:  Elevated LFTs. EXAM: ULTRASOUND ABDOMEN LIMITED RIGHT UPPER QUADRANT COMPARISON:  Abdominal ultrasound 01/02/2021. FINDINGS: Gallbladder: No gallstones or wall thickening visualized. No sonographic Murphy sign noted by sonographer. Common bile duct: Diameter: 2.9 mm. Liver: No focal lesion identified. Within normal limits in parenchymal echogenicity. Portal vein is patent on color Doppler imaging with normal direction of blood flow towards the liver. Other: None. IMPRESSION: Normal right upper quadrant ultrasound. Electronically Signed   By: Ronney Asters M.D.   On: 10/14/2021 01:39    Assessment/Plan  AKI on CKD V: not uremic and Cr slightly improving with treatment of DKA. - no indication to start RRT at present - no uremic signs - pt has appt with Dr Carolin Sicks this upcoming week- advised him to keep - has AVF in the event that we need to start HD - will follow daily and make determinations daily if HD needed  2.  Hyperkalemia - resolved with treatment of DKA  3.  DKA - insulin gtt - A1c 10.7  4.  Anemia - missed ESA d/t COVID - will give here in hospital  5.  MBB -  follow phosphorous  6.  Dispo: admitted  Madelon Lips 10/14/2021, 3:53 PM

## 2021-10-14 NOTE — ED Notes (Signed)
Checked patient cbg it was 70 notified RN of blood sugar patient is resting with call bell in reach 

## 2021-10-14 NOTE — Progress Notes (Signed)
Patient ID: Louis Peters, male   DOB: [birth year], 56 y.o.   MRN: [id number]  PROGRESS NOTE    Louis Peters  IHK:[id number] DOB: 1966/01/19 DOA: 10/13/2021 PCP: Bernerd Limbo, MD   Brief Narrative:  56 y.o. male with medical history significant for advanced CKD with AV fistula but not yet on dialysis, type 1 diabetes mellitus, hypertension, and history of CVA presented with nausea, vomiting and fatigue.  On presentation, he was hypotensive, slightly tachycardic.  Blood glucose of 886 with potassium of 6.3, bicarbonate of 9, BUN of 90, creatinine of 6.64 with anion gap of 27 and mild elevation in transaminases.  Lipase was 106.  WBC of 13,100 he was started on IV fluids and insulin drip.  Assessment & Plan:   DKA in a patient with diabetes mellitus type 1 -Currently on insulin drip and IV fluids.  Blood sugar improving but still elevated.  Continue insulin drip, IV fluids and follow every 4 hours BMP.  If anion gap remains closed in the next BMP, will switch to long-acting insulin. -Diabetes coordinator consult. -A1c 10.7  Acute kidney injury on chronic kidney disease stage V Hyperkalemia: Resolved -Patient has AV fistula but has not been started on dialysis yet -Creatinine 6.64 on presentation (creatinine 6.82 weeks ago) with potassium of 6.3 (received treatment for hyperkalemia in the ED) -Creatinine 7.12 today.  Potassium normalized.  I have consulted nephrology/Dr. Hollie Salk. -Strict input and output.  Daily weights.  Continue IV fluids.  Repeat a.m. labs.  Hypotension, due to hypovolemia History of hypertension -Blood pressure much improved and currently elevated.  Might have to resume antihypertensives.  Leukocytosis -Possibly reactive.  Worsening.  Monitor  Mild acute transaminitis -Improving.  Right upper quadrant ultrasound was negative for acute abnormality -Monitor  Recent COVID infection -Tested positive at home on 09/27/2021 and 10/01/2021.  Subsequently recovered.  No  need for isolation or testing.  History of unspecified CVA in October 2021 -Continue antiplatelets.  Lipitor on hold because of mild acute transaminitis  Hyperlipidemia -Lipitor plan as above  Anemia of chronic disease -Possibly from renal failure.  Hemoglobin currently stable.  No signs of bleeding  Mild hyponatremia -Possibly from hyperglycemia.  Monitor.   DVT prophylaxis: Subcutaneous heparin Code Status: Full Family Communication: Wife at bedside Disposition Plan: Status is: Inpatient  Remains inpatient appropriate because: Of need for IV fluids/insulin drip and creatinine remains elevated.  Consultants: Nephrology  Procedures: None  Antimicrobials: None   Subjective: Patient seen and examined at bedside.  Denies current nausea or vomiting.  Feels slightly hungry.  Denies worsening shortness breath, chest pain.  Making urine.  Objective: Vitals:   10/14/21 0800 10/14/21 0906 10/14/21 1000 10/14/21 1100  BP: (!) 152/88 (!) 158/69 (!) 144/67 128/71  Pulse: 93 86 83 82  Resp: 17 13 13 14   Temp:      TempSrc:      SpO2: 98% 97% 97% 97%  Weight:      Height:        Intake/Output Summary (Last 24 hours) at 10/14/2021 1128 Last data filed at 10/14/2021 1022 Gross per 24 hour  Intake 780.21 ml  Output --  Net 780.21 ml   Filed Weights   10/13/21 2121  Weight: 79.4 kg    Examination:  General exam: Appears calm and comfortable.  Looks chronically ill and deconditioned.  Currently on room air. Respiratory system: Bilateral decreased breath sounds at bases with some scattered crackles Cardiovascular system: S1 & S2 heard, Rate controlled Gastrointestinal system:  Abdomen is nondistended, soft and nontender. Normal bowel sounds heard. Extremities: No cyanosis, clubbing; trace lower extremity edema Central nervous system: Alert and oriented. No focal neurological deficits. Moving extremities.  No asterixis noted. Skin: No rashes, lesions or ulcers Psychiatry:  Affect is mostly flat.  No signs of agitation.   Data Reviewed: I have personally reviewed following labs and imaging studies  CBC: Recent Labs  Lab 10/13/21 2138 10/13/21 2147 10/14/21 0134  WBC 13.1*  --  20.4*  NEUTROABS 12.1*  --   --   HGB 10.5* 11.9* 10.0*  HCT 35.5* 35.0* 33.6*  MCV 98.1  --  96.6  PLT 257  --  419   Basic Metabolic Panel: Recent Labs  Lab 10/13/21 2138 10/13/21 2147 10/14/21 0134 10/14/21 0750  NA 132* 130* 132* 133*  K 6.3* 6.1* 6.1* 4.4  CL 96*  --  99 103  CO2 9*  --  11* 21*  GLUCOSE 886*  --  847* 445*  BUN 90*  --  96* 96*  CREATININE 6.64*  --  7.12* 6.98*  CALCIUM 9.7  --  8.9 8.5*   GFR: Estimated Creatinine Clearance: 12.1 mL/min (A) (by C-G formula based on SCr of 6.98 mg/dL (H)). Liver Function Tests: Recent Labs  Lab 10/13/21 2138 10/14/21 0134  AST 53* 49*  ALT 131* 130*  ALKPHOS 84 85  BILITOT 1.2 1.4*  PROT 5.8* 5.8*  ALBUMIN 3.6 3.5   Recent Labs  Lab 10/13/21 2138  LIPASE 106*   No results for input(s): AMMONIA in the last 168 hours. Coagulation Profile: No results for input(s): INR, PROTIME in the last 168 hours. Cardiac Enzymes: No results for input(s): CKTOTAL, CKMB, CKMBINDEX, TROPONINI in the last 168 hours. BNP (last 3 results) No results for input(s): PROBNP in the last 8760 hours. HbA1C: Recent Labs    10/14/21 0134  HGBA1C 10.7*   CBG: Recent Labs  Lab 10/14/21 0719 10/14/21 0756 10/14/21 0830 10/14/21 0916 10/14/21 1020  GLUCAP 476* 394* 358* 328* 240*   Lipid Profile: No results for input(s): CHOL, HDL, LDLCALC, TRIG, CHOLHDL, LDLDIRECT in the last 72 hours. Thyroid Function Tests: No results for input(s): TSH, T4TOTAL, FREET4, T3FREE, THYROIDAB in the last 72 hours. Anemia Panel: Recent Labs    10/14/21 0134  VITAMINB12 627  FOLATE 25.6  FERRITIN 623*  TIBC 213*  IRON 74  RETICCTPCT 1.1   Sepsis Labs: No results for input(s): PROCALCITON, LATICACIDVEN in the last 168  hours.  No results found for this or any previous visit (from the past 240 hour(s)).       Radiology Studies: US Abdomen Limited RUQ (LIVER/GB)  Result Date: 10/14/2021 CLINICAL DATA:  Elevated LFTs. EXAM: ULTRASOUND ABDOMEN LIMITED RIGHT UPPER QUADRANT COMPARISON:  Abdominal ultrasound 01/02/2021. FINDINGS: Gallbladder: No gallstones or wall thickening visualized. No sonographic Murphy sign noted by sonographer. Common bile duct: Diameter: 2.9 mm. Liver: No focal lesion identified. Within normal limits in parenchymal echogenicity. Portal vein is patent on color Doppler imaging with normal direction of blood flow towards the liver. Other: None. IMPRESSION: Normal right upper quadrant ultrasound. Electronically Signed   By: Ronney Asters M.D.   On: 10/14/2021 01:39        Scheduled Meds:  aspirin EC  81 mg Oral q morning   clopidogrel  75 mg Oral q morning   heparin  5,000 Units Subcutaneous Q8H   Continuous Infusions:  dextrose 5% lactated ringers 85 mL/hr at 10/14/21 1023   insulin 6 Units/hr (  10/14/21 1022)   lactated ringers Stopped (10/14/21 0137)   lactated ringers Stopped (10/14/21 1022)          Aline August, MD [facility] 10/14/2021, 11:28 AM
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
## 2021-10-14 NOTE — Progress Notes (Signed)
Patient arrived to 4E from ED. VSS. Telemetry box applied, CCMD notified. Patient oriented to room and staff. Call bell in reach.  Daymon Larsen, RN

## 2021-10-15 LAB — GLUCOSE, CAPILLARY
Glucose-Capillary: 397 mg/dL — ABNORMAL HIGH (ref 70–99)
Glucose-Capillary: 445 mg/dL — ABNORMAL HIGH (ref 70–99)
Glucose-Capillary: 269 mg/dL — ABNORMAL HIGH (ref 70–99)
Glucose-Capillary: 189 mg/dL — ABNORMAL HIGH (ref 70–99)

## 2021-10-15 LAB — CBC
HCT: 27.3 % — ABNORMAL LOW (ref 39.0–52.0)
Hemoglobin: 9.2 g/dL — ABNORMAL LOW (ref 13.0–17.0)
MCH: 29.1 pg (ref 26.0–34.0)
MCHC: 33.7 g/dL (ref 30.0–36.0)
MCV: 86.4 fL (ref 80.0–100.0)
Platelets: 199 10*3/uL (ref 150–400)
RBC: 3.16 MIL/uL — ABNORMAL LOW (ref 4.22–5.81)
RDW: 13.1 % (ref 11.5–15.5)
WBC: 18.3 10*3/uL — ABNORMAL HIGH (ref 4.0–10.5)
nRBC: 0 % (ref 0.0–0.2)

## 2021-10-15 LAB — COMPREHENSIVE METABOLIC PANEL
ALT: 88 U/L — ABNORMAL HIGH (ref 0–44)
AST: 34 U/L (ref 15–41)
Albumin: 3 g/dL — ABNORMAL LOW (ref 3.5–5.0)
Alkaline Phosphatase: 67 U/L (ref 38–126)
Anion gap: 9 (ref 5–15)
BUN: 92 mg/dL — ABNORMAL HIGH (ref 6–20)
CO2: 23 mmol/L (ref 22–32)
Calcium: 8.8 mg/dL — ABNORMAL LOW (ref 8.9–10.3)
Chloride: 106 mmol/L (ref 98–111)
Creatinine, Ser: 6.41 mg/dL — ABNORMAL HIGH (ref 0.61–1.24)
GFR, Estimated: 9 mL/min — ABNORMAL LOW (ref >60)
Glucose, Bld: 255 mg/dL — ABNORMAL HIGH (ref 70–99)
Potassium: 4.6 mmol/L (ref 3.5–5.1)
Sodium: 138 mmol/L (ref 135–145)
Total Bilirubin: 0.9 mg/dL (ref 0.3–1.2)
Total Protein: 5.2 g/dL — ABNORMAL LOW (ref 6.5–8.1)

## 2021-10-15 LAB — MAGNESIUM: Magnesium: 1.8 mg/dL (ref 1.7–2.4)

## 2021-10-15 MED ORDER — INSULIN ASPART 100 UNIT/ML IJ SOLN
0.0000 [IU] | Freq: Three times a day (TID) | INTRAMUSCULAR | Status: DC
  Administered 2021-10-15: 8 [IU] via SUBCUTANEOUS
  Administered 2021-10-16: 5 [IU] via SUBCUTANEOUS
  Administered 2021-10-16: 11 [IU] via SUBCUTANEOUS
  Administered 2021-10-16: 3 [IU] via SUBCUTANEOUS

## 2021-10-15 MED ORDER — INSULIN GLARGINE-YFGN 100 UNIT/ML ~~LOC~~ SOLN
10.0000 [IU] | SUBCUTANEOUS | Status: DC
  Administered 2021-10-15: 10 [IU] via SUBCUTANEOUS
  Filled 2021-10-15: qty 0.1

## 2021-10-15 MED ORDER — INSULIN ASPART 100 UNIT/ML IJ SOLN
20.0000 [IU] | Freq: Once | INTRAMUSCULAR | Status: AC
  Administered 2021-10-15: 20 [IU] via SUBCUTANEOUS

## 2021-10-15 MED ORDER — SODIUM CHLORIDE 0.9 % IV SOLN: INTRAVENOUS | Status: DC

## 2021-10-15 MED ORDER — DARBEPOETIN ALFA 60 MCG/0.3ML IJ SOSY
60.0000 ug | PREFILLED_SYRINGE | Freq: Once | INTRAMUSCULAR | Status: AC
  Administered 2021-10-15: 60 ug via SUBCUTANEOUS
  Filled 2021-10-15: qty 0.3

## 2021-10-15 MED ORDER — HYDRALAZINE HCL 50 MG PO TABS
100.0000 mg | ORAL_TABLET | Freq: Three times a day (TID) | ORAL | Status: DC
  Administered 2021-10-15 – 2021-10-16 (×4): 100 mg via ORAL
  Filled 2021-10-15 (×4): qty 2

## 2021-10-15 MED ORDER — INSULIN GLARGINE-YFGN 100 UNIT/ML ~~LOC~~ SOLN
15.0000 [IU] | SUBCUTANEOUS | Status: DC
  Administered 2021-10-16: 15 [IU] via SUBCUTANEOUS
  Filled 2021-10-15: qty 0.15

## 2021-10-15 MED ORDER — HYDRALAZINE HCL 25 MG PO TABS
25.0000 mg | ORAL_TABLET | Freq: Four times a day (QID) | ORAL | Status: DC | PRN
  Filled 2021-10-15: qty 1

## 2021-10-15 MED ORDER — INSULIN ASPART 100 UNIT/ML IJ SOLN
3.0000 [IU] | Freq: Three times a day (TID) | INTRAMUSCULAR | Status: DC
  Administered 2021-10-15 – 2021-10-16 (×5): 3 [IU] via SUBCUTANEOUS

## 2021-10-15 NOTE — Plan of Care (Signed)
  Problem: Education: Goal: Knowledge of General Education information will improve Description Including pain rating scale, medication(s)/side effects and non-pharmacologic comfort measures Outcome: Progressing   

## 2021-10-15 NOTE — Progress Notes (Signed)
[facility] Progress Note    Assessment/ Plan:    Assessment/Plan  AKI on CKD V: not uremic and Cr slightly improving with treatment of DKA. - no indication to start RRT at present - no uremic signs - pt has appt with Dr Carolin Sicks this upcoming week- Wednesday - has AVF - no need for RRT- just needs close OP followup.   - OK to d/c from renal perspective- will sign off- call with questions   2.  Hyperkalemia - resolved with treatment of DKA   3.  DKA - insulin gtt - A1c 10.7   4.  Anemia - missed ESA d/t COVID - will give here in hospital--> have ordered Aranesp 60 mcg x 1   5.  MBB - follow phosphorous   6.  Dispo: admitted Subjective:    Seen and examined.  Feeling much better.  Ate breakfast this AM.  No complaints.     Objective:   BP (!) 162/89 (BP Location: Left Arm)    Pulse 83    Temp 98.5 F (36.9 C) (Oral)    Resp 18    Ht 5\' 10"  (1.778 m)    Wt 79.4 kg    SpO2 100%    BMI 25.11 kg/m   Intake/Output Summary (Last 24 hours) at 10/15/2021 1150 Last data filed at 10/15/2021 0912 Gross per 24 hour  Intake 2376.63 ml  Output 925 ml  Net 1451.63 ml   Weight change:   Physical Exam: GEN: NAD, sitting on edge of bed HEENT EOMI PERRL, HOH NECK no JVD PULM clear CV RRR ABD soft EXT no LE edema NEURO no asterixis ACCESS: R AVF + T/B, a little tortuous  Imaging: US Abdomen Limited RUQ (LIVER/GB)  Result Date: 10/14/2021 CLINICAL DATA:  Elevated LFTs. EXAM: ULTRASOUND ABDOMEN LIMITED RIGHT UPPER QUADRANT COMPARISON:  Abdominal ultrasound 01/02/2021. FINDINGS: Gallbladder: No gallstones or wall thickening visualized. No sonographic Murphy sign noted by sonographer. Common bile duct: Diameter: 2.9 mm. Liver: No focal lesion identified. Within normal limits in parenchymal echogenicity. Portal vein is patent on color Doppler imaging with normal direction of blood flow towards the liver. Other: None. IMPRESSION: Normal right upper quadrant  ultrasound. Electronically Signed   By: Ronney Asters M.D.   On: 10/14/2021 01:39    Labs: BMET Recent Labs  Lab 10/13/21 2138 10/13/21 2147 10/14/21 0134 10/14/21 0750 10/14/21 1300 10/14/21 1639 10/15/21 0147  NA 132* 130* 132* 133* 139 139 138  K 6.3* 6.1* 6.1* 4.4 4.2 4.4 4.6  CL 96*  --  99 103 106 106 106  CO2 9*  --  11* 21* 21* 20* 23  GLUCOSE 886*  --  847* 445* 110* 100* 255*  BUN 90*  --  96* 96* 93* 91* 92*  CREATININE 6.64*  --  7.12* 6.98* 6.55* 6.65* 6.41*  CALCIUM 9.7  --  8.9 8.5* 9.3 9.3 8.8*   CBC Recent Labs  Lab 10/13/21 2138 10/13/21 2147 10/14/21 0134 10/15/21 0147  WBC 13.1*  --  20.4* 18.3*  NEUTROABS 12.1*  --   --   --   HGB 10.5* 11.9* 10.0* 9.2*  HCT 35.5* 35.0* 33.6* 27.3*  MCV 98.1  --  96.6 86.4  PLT 257  --  244 199    Medications:     aspirin EC  81 mg Oral q morning   clopidogrel  75 mg Oral q morning   darbepoetin (ARANESP) injection - NON-DIALYSIS  60 mcg Subcutaneous Q Fri-1800  heparin  5,000 Units Subcutaneous Q8H   insulin aspart  0-15 Units Subcutaneous TID WC   insulin aspart  0-5 Units Subcutaneous QHS   insulin aspart  3 Units Subcutaneous TID WC   insulin glargine-yfgn  10 Units Subcutaneous Q24H      Madelon Lips MD 10/15/2021, 11:50 AM

## 2021-10-15 NOTE — Progress Notes (Signed)
°  Transition of Care [facility]) Screening Note   Patient Details  Name: Louis Peters Date of Birth: [birth year]   Transition of Care [facility]) CM/SW Contact:    Bary Castilla, LCSW Phone Number: 10/15/2021, 3:33 PM    Transition of Care Department [facility]) has reviewed patient and no TOC needs have been identified at this time. We will continue to monitor patient advancement through interdisciplinary progression rounds. If new patient transition needs arise, please place a TOC consult.

## 2021-10-15 NOTE — Progress Notes (Signed)
Patient ID: Louis Peters, male   DOB: May 21, 1966, 56 y.o.   MRN: [id number]  PROGRESS NOTE    Louis Peters  XNT:[id number] DOB: 1965/12/11 DOA: 10/13/2021 PCP: Bernerd Limbo, MD   Brief Narrative:  56 y.o. male with medical history significant for advanced CKD with AV fistula but not yet on dialysis, type 1 diabetes mellitus, hypertension, and history of CVA presented with nausea, vomiting and fatigue.  On presentation, he was hypotensive, slightly tachycardic.  Blood glucose of 886 with potassium of 6.3, bicarbonate of 9, BUN of 90, creatinine of 6.64 with anion gap of 27 and mild elevation in transaminases.  Lipase was 106.  WBC of 13,100 he was started on IV fluids and insulin drip.  Nephrology was consulted for acute kidney injury on CKD stage V.  Assessment & Plan:   DKA in a patient with diabetes mellitus type 1 -Treated with IV fluids and insulin drip.  Switched to long-acting insulin once anion gap closed.  Blood sugars still elevated.  Increase long-acting insulin to 20 units daily.  Add NovoLog 3 units 3 times daily with meals along with CBGs with SSI. -DC D5.  Switch to NS at 75 cc an hour. -Diabetes coordinator following. -A1c 10.7 -Carb modified diet.  Acute kidney injury on chronic kidney disease stage V Hyperkalemia: Resolved -Patient has AV fistula but has not been started on dialysis yet -Creatinine 6.64 on presentation (creatinine 6.82 weeks ago) with potassium of 6.3 (received treatment for hyperkalemia in the ED) -Potassium has normalized.   -Nephrology following.  Creatinine 6.41 today. -Strict input and output.  Daily weights. Repeat a.m. labs.  Hypotension, due to hypovolemia History of hypertension -Blood pressure much improved and intermittently elevated.  Might have to resume home antihypertensives.  Leukocytosis -Possibly reactive.  Improving.  Monitor  Mild acute transaminitis -Improving.  Right upper quadrant ultrasound was negative for acute  abnormality -Monitor  Recent COVID infection -Tested positive at home on 09/27/2021 and 10/01/2021.  Subsequently recovered.  No need for isolation or testing.  History of unspecified CVA in October 2021 -Continue antiplatelets.  Lipitor on hold because of mild acute transaminitis  Hyperlipidemia -Lipitor plan as above  Anemia of chronic disease -Possibly from renal failure.  Hemoglobin currently stable.  No signs of bleeding  Mild hyponatremia -Possibly from hyperglycemia.  Improved.   DVT prophylaxis: Subcutaneous heparin Code Status: Full Family Communication: Wife at bedside on 10/14/2021 Disposition Plan: Status is: Inpatient  Remains inpatient appropriate because: Of need for IV fluids. Consultants: Nephrology  Procedures: None  Antimicrobials: None   Subjective: Patient seen and examined at bedside.  Feeling better.  Denies fever, worsening shortness breath, nausea or chest pain. Objective: Vitals:   10/14/21 1624 10/14/21 2018 10/14/21 2340 10/15/21 0345  BP: (!) 156/87 (!) 156/76 131/65 (!) 145/66  Pulse: 80 89 84 75  Resp: 18 17 18 18   Temp: 98.4 F (36.9 C) 98.9 F (37.2 C) 98.3 F (36.8 C) 98.9 F (37.2 C)  TempSrc: Oral Oral Oral Oral  SpO2: 100% 98% 99% 99%  Weight:      Height:        Intake/Output Summary (Last 24 hours) at 10/15/2021 0752 Last data filed at 10/15/2021 0545 Gross per 24 hour  Intake 3156.84 ml  Output 700 ml  Net 2456.84 ml    Filed Weights   10/13/21 2121  Weight: 79.4 kg    Examination:  General exam: No distress.  On room air currently.  Looks chronically ill  and deconditioned.   Respiratory system: Decreased breath sounds at bases bilaterally with some crackles  cardiovascular system: Currently rate controlled; S1-S2 heard Gastrointestinal system: Abdomen is distended slightly; soft and nontender.  Bowel sounds are heard  extremities: Mild lower extremity edema present; no clubbing  Central nervous system: Awake  and alert.  No focal neurological deficits.  Moves extremities. Skin: No obvious petechiae/lesions  psychiatry: Intermittently smiling.  No signs of agitation  Data Reviewed: I have personally reviewed following labs and imaging studies  CBC: Recent Labs  Lab 10/13/21 2138 10/13/21 2147 10/14/21 0134 10/15/21 0147  WBC 13.1*  --  20.4* 18.3*  NEUTROABS 12.1*  --   --   --   HGB 10.5* 11.9* 10.0* 9.2*  HCT 35.5* 35.0* 33.6* 27.3*  MCV 98.1  --  96.6 86.4  PLT 257  --  244 081    Basic Metabolic Panel: Recent Labs  Lab 10/14/21 0134 10/14/21 0750 10/14/21 1300 10/14/21 1639 10/15/21 0147  NA 132* 133* 139 139 138  K 6.1* 4.4 4.2 4.4 4.6  CL 99 103 106 106 106  CO2 11* 21* 21* 20* 23  GLUCOSE 847* 445* 110* 100* 255*  BUN 96* 96* 93* 91* 92*  CREATININE 7.12* 6.98* 6.55* 6.65* 6.41*  CALCIUM 8.9 8.5* 9.3 9.3 8.8*  MG  --   --   --   --  1.8    GFR: Estimated Creatinine Clearance: 13.1 mL/min (A) (by C-G formula based on SCr of 6.41 mg/dL (H)). Liver Function Tests: Recent Labs  Lab 10/13/21 2138 10/14/21 0134 10/15/21 0147  AST 53* 49* 34  ALT 131* 130* 88*  ALKPHOS 84 85 67  BILITOT 1.2 1.4* 0.9  PROT 5.8* 5.8* 5.2*  ALBUMIN 3.6 3.5 3.0*    Recent Labs  Lab 10/13/21 2138  LIPASE 106*    No results for input(s): AMMONIA in the last 168 hours. Coagulation Profile: No results for input(s): INR, PROTIME in the last 168 hours. Cardiac Enzymes: No results for input(s): CKTOTAL, CKMB, CKMBINDEX, TROPONINI in the last 168 hours. BNP (last 3 results) No results for input(s): PROBNP in the last 8760 hours. HbA1C: Recent Labs    10/14/21 0134  HGBA1C 10.7*    CBG: Recent Labs  Lab 10/14/21 1443 10/14/21 1541 10/14/21 1823 10/14/21 2022 10/15/21 0608  GLUCAP 66* 70 285* 388* 397*    Lipid Profile: No results for input(s): CHOL, HDL, LDLCALC, TRIG, CHOLHDL, LDLDIRECT in the last 72 hours. Thyroid Function Tests: No results for input(s): TSH,  T4TOTAL, FREET4, T3FREE, THYROIDAB in the last 72 hours. Anemia Panel: Recent Labs    10/14/21 0134  VITAMINB12 627  FOLATE 25.6  FERRITIN 623*  TIBC 213*  IRON 74  RETICCTPCT 1.1    Sepsis Labs: No results for input(s): PROCALCITON, LATICACIDVEN in the last 168 hours.  No results found for this or any previous visit (from the past 240 hour(s)).       Radiology Studies: US Abdomen Limited RUQ (LIVER/GB)  Result Date: 10/14/2021 CLINICAL DATA:  Elevated LFTs. EXAM: ULTRASOUND ABDOMEN LIMITED RIGHT UPPER QUADRANT COMPARISON:  Abdominal ultrasound 01/02/2021. FINDINGS: Gallbladder: No gallstones or wall thickening visualized. No sonographic Murphy sign noted by sonographer. Common bile duct: Diameter: 2.9 mm. Liver: No focal lesion identified. Within normal limits in parenchymal echogenicity. Portal vein is patent on color Doppler imaging with normal direction of blood flow towards the liver. Other: None. IMPRESSION: Normal right upper quadrant ultrasound. Electronically Signed   By: Warren Lacy  Dagoberto Reef M.D.   On: 10/14/2021 01:39        Scheduled Meds:  aspirin EC  81 mg Oral q morning   clopidogrel  75 mg Oral q morning   darbepoetin (ARANESP) injection - NON-DIALYSIS  60 mcg Subcutaneous Q Fri-1800   heparin  5,000 Units Subcutaneous Q8H   insulin aspart  0-5 Units Subcutaneous QHS   insulin aspart  0-9 Units Subcutaneous TID WC   insulin glargine-yfgn  10 Units Subcutaneous Q24H   Continuous Infusions:  insulin Stopped (10/14/21 1358)          Aline August, MD [facility] 10/15/2021, 7:52 AM

## 2021-10-16 LAB — CBC
HCT: 28 % — ABNORMAL LOW (ref 39.0–52.0)
Hemoglobin: 9.1 g/dL — ABNORMAL LOW (ref 13.0–17.0)
MCH: 28.2 pg (ref 26.0–34.0)
MCHC: 32.5 g/dL (ref 30.0–36.0)
MCV: 86.7 fL (ref 80.0–100.0)
Platelets: 188 10*3/uL (ref 150–400)
RBC: 3.23 MIL/uL — ABNORMAL LOW (ref 4.22–5.81)
RDW: 13 % (ref 11.5–15.5)
WBC: 12.3 10*3/uL — ABNORMAL HIGH (ref 4.0–10.5)
nRBC: 0 % (ref 0.0–0.2)

## 2021-10-16 LAB — BASIC METABOLIC PANEL
Anion gap: 12 (ref 5–15)
BUN: 84 mg/dL — ABNORMAL HIGH (ref 6–20)
CO2: 21 mmol/L — ABNORMAL LOW (ref 22–32)
Calcium: 8.7 mg/dL — ABNORMAL LOW (ref 8.9–10.3)
Chloride: 104 mmol/L (ref 98–111)
Creatinine, Ser: 5.28 mg/dL — ABNORMAL HIGH (ref 0.61–1.24)
GFR, Estimated: 12 mL/min — ABNORMAL LOW (ref >60)
Glucose, Bld: 145 mg/dL — ABNORMAL HIGH (ref 70–99)
Potassium: 4.9 mmol/L (ref 3.5–5.1)
Sodium: 137 mmol/L (ref 135–145)

## 2021-10-16 LAB — GLUCOSE, CAPILLARY
Glucose-Capillary: 163 mg/dL — ABNORMAL HIGH (ref 70–99)
Glucose-Capillary: 315 mg/dL — ABNORMAL HIGH (ref 70–99)
Glucose-Capillary: 242 mg/dL — ABNORMAL HIGH (ref 70–99)

## 2021-10-16 LAB — MAGNESIUM: Magnesium: 1.7 mg/dL (ref 1.7–2.4)

## 2021-10-16 NOTE — Progress Notes (Signed)
Discharge instructions given. Patient verbalized understanding and all questions were answered.  ?

## 2021-10-16 NOTE — Discharge Summary (Signed)
Physician Discharge Summary  Louis Peters MPN:[id number] DOB: 10-05-1965 DOA: 10/13/2021  PCP: Bernerd Limbo, MD  Admit date: 10/13/2021 Discharge date: 10/16/2021  Admitted From: Home Disposition: Home  Recommendations for Outpatient Follow-up:  Follow up with PCP in 1 week with repeat CBC/BMP Outpatient follow-up with nephrology/Dr. Carolin Sicks.  Keep scheduled appointment this coming week. Follow up in ED if symptoms worsen or new appear   Home Health: No Equipment/Devices: None  Discharge Condition: Stable CODE STATUS: Full Diet recommendation: Heart healthy/carb modified  Brief/Interim Summary: 56 y.o. male with medical history significant for advanced CKD with AV fistula but not yet on dialysis, type 1 diabetes mellitus, hypertension, and history of CVA presented with nausea, vomiting and fatigue.  On presentation, he was hypotensive, slightly tachycardic.  Blood glucose of 886 with potassium of 6.3, bicarbonate of 9, BUN of 90, creatinine of 6.64 with anion gap of 27 and mild elevation in transaminases.  Lipase was 106.  WBC of 13,100 he was started on IV fluids and insulin drip.  Nephrology was consulted for acute kidney injury on CKD stage V.  During the hospitalization, his condition has improved.  He has already been transitioned to long-acting insulin.  He is tolerating diet.  His kidney function is improving on IV fluids.  Nephrology signed off and has recommended outpatient follow-up with nephrology.  He will be discharged home today.  Discharge Diagnoses:   DKA in a patient with diabetes mellitus type 1 -Treated with IV fluids and insulin drip.  Switched to long-acting insulin once anion gap closed.  Blood sugars are improving.   -Tolerating diet.  Hemodynamically stable. -Diabetes coordinator following. -A1c 10.7 -Continue carb modified diet. -Discharge patient home today with close outpatient follow-up with PCP.  Resume home regimen.   Acute kidney injury on chronic  kidney disease stage V Hyperkalemia: Resolved -Patient has AV fistula but has not been started on dialysis yet -Creatinine 6.64 on presentation (creatinine 6.82 weeks ago) with potassium of 6.3 (received treatment for hyperkalemia in the ED) -Potassium has normalized.   -Nephrology signed off on 10/15/2021 and recommended outpatient follow-up with Dr. Carolin Sicks, scheduled for this coming week.  -Treated with IV fluids.  Creatinine 5.28 today. -Outpatient follow-up of BMP -Diuretic plan as below.  Hypotension, due to hypovolemia History of hypertension -Blood pressure much improved and currently elevated.  Resume amlodipine, hydralazine and labetalol on discharge.  Keep Lasix and hydrochlorothiazide on hold till reevaluation by PCP/nephrology   leukocytosis -Possibly reactive.  Improving.     Mild acute transaminitis -Improving.  Right upper quadrant ultrasound was negative for acute abnormality   Recent COVID infection -Tested positive at home on 09/27/2021 and 10/01/2021.  Subsequently recovered.  No need for isolation or testing.   History of unspecified CVA in October 2021 -Continue antiplatelets.  Resume Lipitor on discharge   hyperlipidemia -Lipitor plan as above   Anemia of chronic disease -Possibly from renal failure.  Hemoglobin currently stable.  No signs of bleeding  Mild hyponatremia -Possibly from hyperglycemia.  Improved.  Discharge Instructions  Discharge Instructions     Diet - low sodium heart healthy   Complete by: As directed    Diet Carb Modified   Complete by: As directed    Increase activity slowly   Complete by: As directed       Allergies as of 10/16/2021       Reactions   Fexofenadine Rash   Latex Rash        Medication List  STOP taking these medications    chlorpheniramine-HYDROcodone 10-8 MG/5ML Suer Commonly known as: TUSSIONEX   furosemide 40 MG tablet Commonly known as: Lasix   hydrochlorothiazide 25 MG tablet Commonly  known as: HYDRODIURIL   Lagevrio 200 MG Caps capsule Generic drug: molnupiravir EUA   losartan 100 MG tablet Commonly known as: COZAAR   losartan 50 MG tablet Commonly known as: Cozaar   metoCLOPramide 5 MG tablet Commonly known as: REGLAN   oxyCODONE-acetaminophen 5-325 MG tablet Commonly known as: Percocet   triamcinolone cream 0.1 % Commonly known as: KENALOG       TAKE these medications    amLODipine 10 MG tablet Commonly known as: NORVASC Take 10 mg by mouth every morning.   ammonium lactate 12 % lotion Commonly known as: LAC-HYDRIN Apply 1 application topically daily as needed for dry skin.   aspirin 81 MG EC tablet Take 1 tablet (81 mg total) by mouth daily. Swallow whole. What changed: when to take this   atorvastatin 80 MG tablet Commonly known as: LIPITOR Take 1 tablet (80 mg total) by mouth daily. What changed: when to take this   Baqsimi One Pack 3 MG/DOSE Powd Generic drug: Glucagon Place 3 mg into the nose once as needed (low blood sugar).   cholecalciferol 25 MCG (1000 UNIT) tablet Commonly known as: VITAMIN D3 Take 1,000 Units by mouth every morning.   clopidogrel 75 MG tablet Commonly known as: PLAVIX Take 75 mg by mouth every morning.   FreeStyle Libre 2 Reader [place] by Does not apply route.   FreeStyle Libre 2 Sensor Misc by Does not apply route.   HumaLOG KwikPen 200 UNIT/ML KwikPen Generic drug: insulin lispro Inject 5-8 Units into the skin See admin instructions. Inject 5 units subcutaneously before breakfast and lunch, inject 8 units before supper   hydrALAZINE 100 MG tablet Commonly known as: APRESOLINE Take 100 mg by mouth 3 (three) times daily.   insulin degludec 100 UNIT/ML FlexTouch Pen Commonly known as: TRESIBA Inject 12 Units into the skin at bedtime.   Insulin Pen Needle 32G X 4 MM Misc Use with lantus and humalog 4 imes per day   labetalol 100 MG tablet Commonly known as: NORMODYNE Take 100 mg by mouth 2  (two) times daily with breakfast and lunch.   ondansetron 4 MG disintegrating tablet Commonly known as: ZOFRAN-ODT Take 1 tablet (4 mg total) by mouth every 8 (eight) hours as needed for nausea or vomiting. What changed: Another medication with the same name was removed. Continue taking this medication, and follow the directions you see here.   ONE TOUCH LANCETS Misc Use to check blood sugar 8 time(s) daily   OneTouch Verio Flex System w/Device Kit by Does not apply route.   OneTouch Verio test strip Generic drug: glucose blood SMARTSIG:Via Meter 8 Times Daily        Follow-up Information     Bernerd Limbo, MD. Schedule an appointment as soon as possible for a visit in 1 week(s).   Specialty: Family Medicine Why: with repeat cbc/bmp Contact information: [place] [id number] [phone]         Rosita Fire, MD Follow up.   Specialties: Nephrology, Internal Medicine Why: Keep scheduled appointment Contact information: [address] [place] [address] [phone]                Allergies  Allergen Reactions   Fexofenadine Rash   Latex Rash    Consultations: Nephrology  Procedures/Studies: DG Chest 2 View  Result Date: 10/01/2021 CLINICAL DATA:  Cough, congestion, COVID. EXAM: CHEST - 2 VIEW COMPARISON:  March 20, 2021 FINDINGS: The heart size and mediastinal contours are within normal limits. Both lungs are clear. 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 Degenerative joint changes of the spine are noted. IMPRESSION: No active cardiopulmonary disease. Electronically Signed   By: Abelardo Diesel M.D.   On: 10/01/2021 14:02   US Abdomen Limited RUQ (LIVER/GB)  Result Date: 10/14/2021 CLINICAL DATA:  Elevated LFTs. EXAM: ULTRASOUND ABDOMEN LIMITED RIGHT UPPER QUADRANT COMPARISON:  Abdominal ultrasound 01/02/2021. FINDINGS: Gallbladder: No gallstones or wall thickening visualized. No  sonographic Murphy sign noted by sonographer. Common bile duct: Diameter: 2.9 mm. Liver: No focal lesion identified. Within normal limits in parenchymal echogenicity. Portal vein is patent on color Doppler imaging with normal direction of blood flow towards the liver. Other: None. IMPRESSION: Normal right upper quadrant ultrasound. Electronically Signed   By: Ronney Asters M.D.   On: 10/14/2021 01:39      Subjective: Patient seen and examined at bedside.  Feels much better and wants to home today.  Denies any overnight fever, nausea, vomiting.  Tolerating diet.  Discharge Exam: Vitals:   10/16/21 0336 10/16/21 0755  BP: (!) 156/91 (!) 189/96  Pulse: 77 90  Resp: 17 18  Temp: 98.6 F (37 C) 98.5 F (36.9 C)  SpO2: 100% 100%    General: Pt is alert, awake, not in acute distress Cardiovascular: rate controlled, S1/S2 + Respiratory: bilateral decreased breath sounds at bases Abdominal: Soft, NT, ND, bowel sounds + Extremities: Trace lower extremity edema present; no cyanosis    The results of significant diagnostics from this hospitalization (including imaging, microbiology, ancillary and laboratory) are listed below for reference.     Microbiology: No results found for this or any previous visit (from the past 240 hour(s)).   Labs: BNP (last 3 results) No results for input(s): BNP in the last 8760 hours. Basic Metabolic Panel: Recent Labs  Lab 10/14/21 0750 10/14/21 1300 10/14/21 1639 10/15/21 0147 10/16/21 0145  NA 133* 139 139 138 137  K 4.4 4.2 4.4 4.6 4.9  CL 103 106 106 106 104  CO2 21* 21* 20* 23 21*  GLUCOSE 445* 110* 100* 255* 145*  BUN 96* 93* 91* 92* 84*  CREATININE 6.98* 6.55* 6.65* 6.41* 5.28*  CALCIUM 8.5* 9.3 9.3 8.8* 8.7*  MG  --   --   --  1.8 1.7   Liver Function Tests: Recent Labs  Lab 10/13/21 2138 10/14/21 0134 10/15/21 0147  AST 53* 49* 34  ALT 131* 130* 88*  ALKPHOS 84 85 67  BILITOT 1.2 1.4* 0.9  PROT 5.8* 5.8* 5.2*  ALBUMIN 3.6 3.5  3.0*   Recent Labs  Lab 10/13/21 2138  LIPASE 106*   No results for input(s): AMMONIA in the last 168 hours. CBC: Recent Labs  Lab 10/13/21 2138 10/13/21 2147 10/14/21 0134 10/15/21 0147 10/16/21 0145  WBC 13.1*  --  20.4* 18.3* 12.3*  NEUTROABS 12.1*  --   --   --   --   HGB 10.5* 11.9* 10.0* 9.2* 9.1*  HCT 35.5* 35.0* 33.6* 27.3* 28.0*  MCV 98.1  --  96.6 86.4 86.7  PLT 257  --  244 199 188   Cardiac Enzymes: No results for input(s): CKTOTAL, CKMB, CKMBINDEX, TROPONINI in the last 168 hours. BNP: Invalid input(s): POCBNP CBG: Recent Labs  Lab 10/15/21 1610 10/15/21 1235 10/15/21  1629 10/15/21 2046 10/16/21 0617  GLUCAP 397* 445* 269* 189* 163*   D-Dimer No results for input(s): DDIMER in the last 72 hours. Hgb A1c Recent Labs    10/14/21 0134  HGBA1C 10.7*   Lipid Profile No results for input(s): CHOL, HDL, LDLCALC, TRIG, CHOLHDL, LDLDIRECT in the last 72 hours. Thyroid function studies No results for input(s): TSH, T4TOTAL, T3FREE, THYROIDAB in the last 72 hours.  Invalid input(s): FREET3 Anemia work up Recent Labs    10/14/21 0134  VITAMINB12 627  FOLATE 25.6  FERRITIN 623*  TIBC 213*  IRON 74  RETICCTPCT 1.1   Urinalysis    Component Value Date/Time   COLORURINE YELLOW 10/14/2021 0815   APPEARANCEUR HAZY (A) 10/14/2021 0815   LABSPEC 1.011 10/14/2021 0815   PHURINE 5.0 10/14/2021 0815   GLUCOSEU >=500 (A) 10/14/2021 0815   HGBUR NEGATIVE 10/14/2021 0815   BILIRUBINUR NEGATIVE 10/14/2021 0815   KETONESUR NEGATIVE 10/14/2021 0815   PROTEINUR 100 (A) 10/14/2021 0815   NITRITE NEGATIVE 10/14/2021 0815   LEUKOCYTESUR NEGATIVE 10/14/2021 0815   Sepsis Labs Invalid input(s): PROCALCITONIN,  WBC,  LACTICIDVEN Microbiology No results found for this or any previous visit (from the past 240 hour(s)).   Time coordinating discharge: 35 minutes  SIGNED:   Aline August, MD  [facility] 10/16/2021, 10:04 AM

## 2021-10-24 ENCOUNTER — Other Ambulatory Visit: Payer: Self-pay

## 2021-10-24 ENCOUNTER — Ambulatory Visit (HOSPITAL_COMMUNITY)
Admission: RE | Admit: 2021-10-24 | Discharge: 2021-10-24 | Disposition: A | Discharge status: Home / Self Care | Payer: Medicare Other | Source: Ambulatory Visit | Attending: Nephrology | Admitting: Nephrology

## 2021-10-24 VITALS — BP 139/72 | HR 85 | Temp 97.0°F | Resp 18

## 2021-10-24 DIAGNOSIS — N185 Chronic kidney disease, stage 5: Secondary | ICD-10-CM | POA: Insufficient documentation

## 2021-10-24 MED ORDER — EPOETIN ALFA-EPBX 10000 UNIT/ML IJ SOLN
INTRAMUSCULAR | Status: AC
  Filled 2021-10-24: qty 1

## 2021-10-24 MED ORDER — EPOETIN ALFA-EPBX 10000 UNIT/ML IJ SOLN
10000.0000 [IU] | INTRAMUSCULAR | Status: DC
  Administered 2021-10-24: 10000 [IU] via SUBCUTANEOUS

## 2021-10-25 LAB — POCT HEMOGLOBIN-HEMACUE: Hemoglobin: 10.9 g/dL — ABNORMAL LOW (ref 13.0–17.0)

## 2021-10-26 ENCOUNTER — Encounter (HOSPITAL_COMMUNITY): Payer: Medicare Other

## 2021-11-14 ENCOUNTER — Inpatient Hospital Stay (HOSPITAL_COMMUNITY): Admission: RE | Admit: 2021-11-14 | Payer: Medicare Other | Source: Ambulatory Visit

## 2021-11-18 ENCOUNTER — Encounter (HOSPITAL_COMMUNITY): Payer: Medicare Other

## 2021-11-21 ENCOUNTER — Encounter (HOSPITAL_COMMUNITY): Payer: Medicare Other

## 2021-11-21 NOTE — Progress Notes (Signed)
[facility] Note  11/22/2021                        CHIEF COMPLAINT Patient presents for Retina Follow Up    HISTORY OF PRESENT ILLNESS: Louis Peters is a 56 y.o. male who presents to the clinic today for:   HPI     Retina Follow Up   Patient presents with  Diabetic Retinopathy.  In both eyes.  Severity is moderate.  Duration of 5 months.  Since onset it is stable.  I, the attending physician,  performed the HPI with the patient and updated documentation appropriately.        Comments   5 month retina follow up for Pdr ou patient states no change noticed. Patient blood sugar unknown       Last edited by [name], MD on 11/23/2021  1:43 PM.    Pt was recently hospitalized, he caught covid from his mother in law, which caused his blood sugar to spike to "over 900", no change in vision  Referring physician: Lisabeth Pick, MD [place],  [place] [address]  HISTORICAL INFORMATION:   Selected notes from the [facility] Referred by Dr. [name] for concern of retinal traction LEE: 09.25.19 (K. Hallahan) [BCVA: OD: CF_0  OS: 20/40] Ocular Hx-vitreous hemorrhage OS, traction detachment OD, PDR OU Previous eye docs: [name] Last visit w/ [name] was in 2016 -- was supposed to schedule surgery OS (PPV, MP, EL) but pt never followed up PMH-DM (last A1C: 11.1, takes humalog, lantus), HTN   CURRENT MEDICATIONS: No current outpatient medications on file. (Ophthalmic Drugs)   No current facility-administered medications for this visit. (Ophthalmic Drugs)   Current Outpatient Medications (Other)  Medication Sig   amLODipine (NORVASC) 10 MG tablet Take 10 mg by mouth every morning.   ammonium lactate (LAC-HYDRIN) 12 % lotion Apply 1 application topically daily as needed for dry skin.   aspirin EC 81 MG EC tablet Take 1 tablet (81 mg total) by mouth daily. Swallow whole. (Patient taking differently: Take  81 mg by mouth every morning. Swallow whole.)   atorvastatin (LIPITOR) 80 MG tablet Take 1 tablet (80 mg total) by mouth daily. (Patient taking differently: Take 80 mg by mouth every morning.)   Blood Glucose Monitoring Suppl ([place]) w/Device KIT by Does not apply route.   cholecalciferol (VITAMIN D3) 25 MCG (1000 UNIT) tablet Take 1,000 Units by mouth every morning.   clopidogrel (PLAVIX) 75 MG tablet Take 75 mg by mouth every morning.   Continuous Blood Gluc Receiver (FREESTYLE LIBRE 2 READER) DEVI by Does not apply route.   Continuous Blood Gluc Sensor (FREESTYLE LIBRE 2 SENSOR) MISC by Does not apply route.   hydrALAZINE (APRESOLINE) 100 MG tablet Take 100 mg by mouth 3 (three) times daily.   insulin degludec (TRESIBA) 100 UNIT/ML SOPN FlexTouch Pen Inject 12 Units into the skin at bedtime.   insulin lispro (HUMALOG KWIKPEN) 200 UNIT/ML KwikPen Inject 5-8 Units into the skin See admin instructions. Inject 5 units subcutaneously before breakfast and lunch, inject 8 units before supper   Insulin Pen Needle 32G X 4 MM MISC Use with lantus and humalog 4 imes per day   labetalol (NORMODYNE) 100 MG tablet Take 100 mg by mouth 2 (two) times daily with breakfast and lunch.   ondansetron (ZOFRAN-ODT) 4 MG disintegrating tablet Take 1 tablet (4 mg total)  by mouth every 8 (eight) hours as needed for nausea or vomiting.   ONE TOUCH LANCETS MISC Use to check blood sugar 8 time(s) daily   ONETOUCH VERIO test strip SMARTSIG:Via Meter 8 Times Daily   Glucagon (BAQSIMI ONE PACK) 3 MG/DOSE POWD Place 3 mg into the nose once as needed (low blood sugar). (Patient not taking: Reported on 10/13/2021)   No current facility-administered medications for this visit. (Other)   REVIEW OF SYSTEMS: ROS   Positive for: Genitourinary, Endocrine, Eyes Negative for: Constitutional, Gastrointestinal, Neurological, Skin, Musculoskeletal, HENT, Cardiovascular, Respiratory, Psychiatric, Allergic/Imm,  Heme/Lymph Last edited by Elmore Guise, COT on 11/22/2021  1:57 PM.     ALLERGIES Allergies  Allergen Reactions   Fexofenadine Rash   Latex Rash   PAST MEDICAL HISTORY Past Medical History:  Diagnosis Date   Benign hypertension with chronic kidney disease, stage III ([place]) 03/10/2019   Chronic kidney disease (CKD), stage III (moderate) ([place]) 03/10/2019   Diabetes mellitus type 1, uncomplicated ([place]) 06/09/9210   Diabetes mellitus without complication ([place])    Diabetic retinopathy ([place])    PDR OU   Heat Injury 03/10/2019   Hypercholesterolemia 03/10/2019   Hypertension    Retinal detachment    TRD OD   Past Surgical History:  Procedure Laterality Date   AV FISTULA PLACEMENT Right 11/08/2020   Procedure: RIGHT ARM BRACHIOCEPHALIC ARTERIOVENOUS (AV) FISTULA CREATION;  Surgeon: Angelia Mould, MD;  Location: [facility] OR;  Service: Vascular;  Laterality: Right;   CATARACT EXTRACTION     EYE SURGERY     RETINAL DETACHMENT SURGERY     FAMILY HISTORY Family History  Problem Relation Age of Onset   Diabetes Maternal Grandmother    Diabetes Sister    SOCIAL HISTORY Social History   Tobacco Use   Smoking status: Never   Smokeless tobacco: Never  Vaping Use   Vaping Use: Never used  Substance Use Topics   Alcohol use: Yes    Comment: occ   Drug use: No       OPHTHALMIC EXAM:  Base Eye Exam     Visual Acuity (Snellen - Linear)       Right Left   Dist cc 20/150-2 20/20-2   Dist ph cc 20NI     Correction: Glasses         Tonometry (Tonopen, 2:02 PM)       Right Left   Pressure 12 9         Pupils       Dark Light Shape React APD   Right 3 2 Round Minimal None   Left 3 2 Round Brisk None         Visual Fields (Counting fingers)       Left Right    Full    Restrictions  Partial outer superior temporal deficiency         Extraocular Movement       Right Left    Full, Ortho Full, Ortho         Neuro/Psych     Oriented x3: Yes   Mood/Affect:  Normal         Dilation     Both eyes: 1.0% Mydriacyl, 2.5% Phenylephrine @ 2:02 PM           Slit Lamp and Fundus Exam     Slit Lamp Exam       Right Left   Lids/Lashes Dermatochalasis - upper lid, Meibomian gland dysfunction Dermatochalasis - upper lid, Meibomian  gland dysfunction   Conjunctiva/Sclera Melanosis Melanosis   Cornea Arcus Arcus   Anterior Chamber Deep and quiet Deep and quiet   Iris Round and dilated, No NVI Round and dilated, No NVI   Lens 2-3+ Nuclear sclerosis, 2-3+ Cortical cataract 2-3+ Nuclear sclerosis, 3+ Cortical cataract   Anterior Vitreous Vitreous syneresis VH improved centrally, boat shaped heme below inferior arcade improved, mild blood clots inferiorly         Fundus Exam       Right Left   Disc 1-2+ Pallor, fibrosis eminating to ST arcade, Sharp rim sharp rim, mild pallor, mild PPA / PPP   C/D Ratio 0.0 0.4   Macula TRD with vertical fibrotic band extending from ST to IT arcades, tractional fibrosis -- stable from prior Flat, blunted foveal reflex, +edema superior macula, RPE mottling and clumping   Vessels Severe Vascular attenuation, sclerotic arterioles, +fibrotic NV along temporal arcades - inactive Vascular attenuation, early arteriolar sclerosis ST quad, fibrosis along temporal arcades, fibrotic NVE along distal IT arcade -- regressed   Periphery Attached peripherally with 360 PRP laser, No heme Attached, tractional retinoschisis along superior arcades; 360 PRP with good posterior fill in superiorly towards schisis, room for fill in           Refraction     Wearing Rx       Sphere Cylinder Axis Add   Right -0.50 +0.75 045 +1.75   Left -3.00 +1.25 155 +1.75           IMAGING AND PROCEDURES  Imaging and Procedures for _0 @  OCT, Retina - OU - Both Eyes       Right Eye Quality was good. Central Foveal Thickness: 324 (Unable to obtain). Progression has been stable. 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   Left Eye Quality was good. Central Foveal Thickness: 208. Progression has been stable. Findings include abnormal foveal contour, no SRF, intraretinal fluid, vitreous traction (Persistent, stable tractional schisis superiorly, +vitreous opacities).   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 CME - cystoid macular edema. PED - pigment epithelial detachment. IRF - intraretinal fluid. SRF - subretinal fluid. EZ - ellipsoid zone. ERM - epiretinal membrane. ORA - outer retinal atrophy. ORT - outer retinal tubulation. SRHM - subretinal hyper-reflective material             ASSESSMENT/PLAN:    ICD-10-CM   1. 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 Right eye affected by proliferative diabetic retinopathy with traction retinal detachment involving macula, associated with type 2 diabetes mellitus ([place])  U72.5366     3. Essential hypertension  I10     4. Hypertensive retinopathy of both eyes  H35.033     5. Combined forms of age-related cataract of both eyes  H25.813      1,2. Proliferative diabetic retinopathy OU  - A1C: 9.2 on 02.16.23; 9.3 on 07.14.22, 8.9 on 04.20.22  - pt lost to f/u here from 02.12.2020 to 01.27.22  - OD -- chronic, macula-involving TRD -- BCVA 20/150 from 20/400 -- severe retinal  ischemia  - OS -- vitreous traction with macular edema / retinoschisis of superior macula -- BCVA 20/20 stable  - former pt of Dr. Zadie Rhine in 2016 -- records  reviewed, at that time, OD was already detached with VA 20/400, OS was to undergo PPV/MP/EL, but pt never had the surgery or followed up with [name]  - history of new onset vitreous hemorrhage following prior laser  - S/P PRP fill-in OS (10.01.19), fill-in (01.13.20)  - FA (01.13.20) shows active NVE OS, OD with minimal leakage   - VA 20/20 OS -- stable  - OCT shows chronic mac off TRD OD, and OS with tractional retinoschisis / macular edema of superior macula -- stdable  - repeat FA 02.25.22 shows interval improvement in NVE OS -- minimal leakage along tractional schisis superior macula OS  - discussed possible need for surgery in the future  - OS may eventually need PPV / MP to relieve tractional retinoschisis, but with VA 20/20, will monitor for now  - F/U 4 months, sooner prn -- DFE, OCT  2. Chronic TRD OD-   - fovea involving chronic TRD OD -- periphery attached by laser PRP  - severe ischemia / arteriolar sclerosis OD -- low vision potential  - review of records for [name] indicate mac off TRD has been present since early 2016 -- now 3+ years  - discussed findings and poor prognosis  - do not recommend surgical intervention OD -- high risk, low benefit  - pt not interested in surgical intervention at this time  - monitor  3,4. Hypertensive retinopathy OU  - discussed importance of tight BP control  - monitor  5. Combined form age related cataracts OU  - The symptoms of cataract, surgical options, and treatments and risks were discussed with patient.  - discussed diagnosis and progression  - approaching visual significance  - recommend eval w/ Dr. Lucianne Lei  Ophthalmic Meds Ordered this visit:  No orders of the defined types were placed in this encounter.    Return in about 4 months (around 03/22/2022) for f/u PDR OU, DFE, OCT.  There are no Patient Instructions on file for this visit.  This document serves as a record of services personally performed by Gardiner Sleeper, MD, PhD.  It was created on their behalf by San Jetty. Owens Shark, OA an ophthalmic technician. The creation of this record is the provider's dictation and/or activities during the visit.    Electronically signed by: San Jetty. Owens Shark, [place] 02.20.2023 1:48 PM  Gardiner Sleeper, M.D., Ph.D. Diseases & Surgery of the Retina and Vitreous [facility]  I have reviewed the above documentation for accuracy and completeness, and I agree with the above. Gardiner Sleeper, M.D., Ph.D. 11/23/21 1:48 PM   Abbreviations: M myopia (nearsighted); A astigmatism; H hyperopia (farsighted); P presbyopia; Mrx spectacle prescription;  CTL contact lenses; OD right eye; OS left eye; OU both eyes  XT exotropia; ET esotropia; PEK punctate epithelial keratitis; PEE punctate epithelial erosions; DES dry eye syndrome; MGD meibomian gland dysfunction; ATs artificial tears; PFAT's preservative free artificial tears; [place] nuclear sclerotic cataract; PSC posterior subcapsular cataract; ERM epi-retinal membrane; PVD posterior vitreous detachment; RD retinal detachment; DM diabetes mellitus; DR diabetic retinopathy; NPDR non-proliferative diabetic retinopathy; PDR proliferative diabetic retinopathy; CSME clinically significant macular edema; DME diabetic macular edema; dbh dot blot hemorrhages; CWS cotton wool spot; POAG primary open angle glaucoma; C/D cup-to-disc ratio; HVF humphrey visual field; GVF goldmann visual field; OCT optical coherence  tomography; IOP intraocular pressure; BRVO Branch retinal vein occlusion; CRVO central retinal vein occlusion; CRAO central retinal artery occlusion; BRAO branch retinal artery occlusion; RT retinal tear; SB scleral buckle; PPV pars plana vitrectomy; VH Vitreous hemorrhage; PRP panretinal laser photocoagulation; IVK intravitreal kenalog; VMT vitreomacular traction; MH Macular hole;  NVD neovascularization of the disc; NVE neovascularization elsewhere; AREDS age related eye disease study; ARMD age  related macular degeneration; POAG primary open angle glaucoma; EBMD epithelial/anterior basement membrane dystrophy; ACIOL anterior chamber intraocular lens; IOL intraocular lens; PCIOL posterior chamber intraocular lens; Phaco/IOL phacoemulsification with intraocular lens placement; [place] photorefractive keratectomy; LASIK laser assisted in situ keratomileusis; HTN hypertension; DM diabetes mellitus; COPD chronic obstructive pulmonary disease

## 2021-11-22 ENCOUNTER — Encounter (INDEPENDENT_AMBULATORY_CARE_PROVIDER_SITE_OTHER): Payer: Self-pay | Admitting: Ophthalmology

## 2021-11-22 ENCOUNTER — Ambulatory Visit (INDEPENDENT_AMBULATORY_CARE_PROVIDER_SITE_OTHER): Payer: Medicare Other | Admitting: Ophthalmology

## 2021-11-22 ENCOUNTER — Other Ambulatory Visit: Payer: Self-pay

## 2021-11-22 DIAGNOSIS — E113512 Type 2 diabetes mellitus with proliferative diabetic retinopathy with macular edema, left eye: Secondary | ICD-10-CM

## 2021-11-22 DIAGNOSIS — I10 Essential (primary) hypertension: Secondary | ICD-10-CM | POA: Diagnosis not present

## 2021-11-22 DIAGNOSIS — H35033 Hypertensive retinopathy, bilateral: Secondary | ICD-10-CM | POA: Diagnosis not present

## 2021-11-22 DIAGNOSIS — H25813 Combined forms of age-related cataract, bilateral: Secondary | ICD-10-CM

## 2021-11-22 DIAGNOSIS — E113521 Type 2 diabetes mellitus with proliferative diabetic retinopathy with traction retinal detachment involving the macula, right eye: Secondary | ICD-10-CM | POA: Diagnosis not present

## 2021-11-23 ENCOUNTER — Encounter (INDEPENDENT_AMBULATORY_CARE_PROVIDER_SITE_OTHER): Payer: Self-pay | Admitting: Ophthalmology

## 2021-12-19 ENCOUNTER — Encounter (HOSPITAL_COMMUNITY): Payer: Medicare Other

## 2022-01-13 ENCOUNTER — Encounter: Payer: Self-pay | Admitting: Dietician

## 2022-01-13 ENCOUNTER — Encounter: Payer: Medicare Other | Attending: Endocrinology | Admitting: Dietician

## 2022-01-13 DIAGNOSIS — Z713 Dietary counseling and surveillance: Secondary | ICD-10-CM | POA: Diagnosis not present

## 2022-01-13 DIAGNOSIS — N289 Disorder of kidney and ureter, unspecified: Secondary | ICD-10-CM | POA: Diagnosis not present

## 2022-01-13 DIAGNOSIS — I129 Hypertensive chronic kidney disease with stage 1 through stage 4 chronic kidney disease, or unspecified chronic kidney disease: Secondary | ICD-10-CM | POA: Diagnosis not present

## 2022-01-13 DIAGNOSIS — E109 Type 1 diabetes mellitus without complications: Secondary | ICD-10-CM

## 2022-01-13 DIAGNOSIS — Z6828 Body mass index (BMI) 28.0-28.9, adult: Secondary | ICD-10-CM | POA: Diagnosis not present

## 2022-01-13 DIAGNOSIS — E1022 Type 1 diabetes mellitus with diabetic chronic kidney disease: Secondary | ICD-10-CM | POA: Insufficient documentation

## 2022-01-13 DIAGNOSIS — E10649 Type 1 diabetes mellitus with hypoglycemia without coma: Secondary | ICD-10-CM | POA: Diagnosis not present

## 2022-01-13 DIAGNOSIS — E7800 Pure hypercholesterolemia, unspecified: Secondary | ICD-10-CM | POA: Diagnosis not present

## 2022-01-13 DIAGNOSIS — N184 Chronic kidney disease, stage 4 (severe): Secondary | ICD-10-CM | POA: Insufficient documentation

## 2022-01-13 DIAGNOSIS — N185 Chronic kidney disease, stage 5: Secondary | ICD-10-CM

## 2022-01-13 NOTE — Patient Instructions (Signed)
Breakfast, lunch, and dinner daily ? Small meals with small amount of protein ?Drink water or unsweetened tea ?See the handout regarding what fruits and vegetables are best to eat. ?Small amounts of starch with your meals (bread, corn, grits, oatmeal, noodles, pasta, etc. - these portions should be small) ?Continue to follow a low salt diet ? ?I will contact your doctor about an order for PODs for the Omnipod Dash and Humalog in a vial as well as the reader for the [place] 2. ? ?Please call me when you have the PODS and Humalog in a vial and we can make an appointment to program the pump and start it. ?

## 2022-01-13 NOTE — Progress Notes (Signed)
?Diabetes Self-Management Education ? ?Visit Type: First/Initial ? ?Appt. Start Time: 1040 Appt. End Time: 1200 ? ?01/13/2022 ? ?[name], identified by name and date of birth, is a 56 y.o. male with a diagnosis of Diabetes: Type 1.  ? ?ASSESSMENT ?Patient is here today alone.  He was last seen by another RD in our office in 2016 and another diabetes educator prior to that [place]. ?He states that recently he has not been eating.  He states that he has stage 4 CKD. ?He brought a new Omnipod Dash PDM but no PODs or insulin.  He states that he used the Omnipod 3 in the past but did not carbohydrate count.  He stated that he did not think the pump worked well and has been off a pump for 6 years. ? ?Recently rant out of [place] for 5 days with glucose up to 900 and had COVID during that time as well.  He states the pharmacy did not have the insulin.  He has learned problem solving since that time (get a sample from the MD office or speak to the pharmacist to locate at another branch).  He was hospitalized in January for DKA related to this. ? ?Sensor reading 486 this am and currently 242. ? ?Patient sees Dr. Hartford Poli.  Will message him re:  PODs prescription and Libre 2 reader prescription. ? ?History includes Type 1 diabetes (dx 1982), CKD stage 4, HLD, HTN, proliferative diabetic retinopathy, gastroparesis, CVA, , elevated LFT's, neurocognitive and psychosocial issues. ?Labs noted:  A1C 9.2% 11/17/2021 decreased from 10.7% 10/14/2021. ?Proteinuria ?Medications include:  Tresiba  8 units q HS, Humalog 4 units before each meal. ? ?CGM:  He is using the [place] 14 day and needs to get the [place] 2 (he has a prescription). ?His phone is not compatible with [place]. ? ?Patient lives with his wife and son.  He does the shopping, cleaning and cooking.  He is on disability.  His wife manages his medications.  She works as a Pharmacist, hospital. ?He drives.  Loves to ride bike but has not recently. ?Avoids added salt.    ?Height 5\' 6"  (1.676  m), weight 177 lb (80.3 kg). ?Body mass index is 28.57 kg/m?. ? ? Diabetes Self-Management Education - 01/13/22 1124   ? ?  ? Visit Information  ? Visit Type First/Initial   ?  ? Initial Visit  ? Diabetes Type Type 1   ? Are you currently following a meal plan? No   ? Are you taking your medications as prescribed? Yes   ? Date Diagnosed 1983   ?  ? Health Coping  ? How would you rate your overall health? Fair   ?  ? Psychosocial Assessment  ? Patient Belief/Attitude about Diabetes Defeat/Burnout   ? Self-care barriers Debilitated state due to current medical condition;Impaired vision;Low literacy   ? Self-management support Doctor's office;Family   ? Other persons present Patient   ? Patient Concerns Nutrition/Meal planning   ? Special Needs None   ? Preferred Learning Style No preference indicated   ? Learning Readiness Contemplating   ? How often do you need to have someone help you when you read instructions, pamphlets, or other written materials from your doctor or pharmacy? 5 - Always   ? What is the last grade level you completed in school? 12   ?  ? Pre-Education Assessment  ? Patient understands the diabetes disease and treatment process. Needs Review   ? Patient understands incorporating nutritional management  into lifestyle. Needs Review   ? Patient undertands incorporating physical activity into lifestyle. Needs Review   ? Patient understands using medications safely. Needs Review   ? Patient understands monitoring blood glucose, interpreting and using results Needs Review   ? Patient understands prevention, detection, and treatment of acute complications. Needs Review   ? Patient understands prevention, detection, and treatment of chronic complications. Needs Review   ? Patient understands how to develop strategies to address psychosocial issues. Needs Review   ? Patient understands how to develop strategies to promote health/change behavior. Needs Review   ?  ? Complications  ? Last HgB A1C per  patient/outside source 9.2 %   11/17/2021  ? How often do you check your blood sugar? > 4 times/day   ? Fasting Blood glucose range (mg/dL) >200   500-600  ? Postprandial Blood glucose range (mg/dL) >200   ? Number of hypoglycemic episodes per month 0   ? Number of hyperglycemic episodes per week 21   ? Can you tell when your blood sugar is high? No   ? Have you had a dilated eye exam in the past 12 months? Yes   ? Have you had a dental exam in the past 12 months? No   ? Are you checking your feet? Yes   ? How many days per week are you checking your feet? 5   ?  ? Dietary Intake  ? Breakfast eggs, sausage, 2 slices white bread (plain)   ? Snack (morning) none   ? Lunch skips   ? Snack (afternoon) none   ? Dinner steak, creamed corn   ? Snack (evening) occasional cake or donuts   ? Beverage(s) water, sweet tea (30 oz per day)   ?  ? Exercise  ? Exercise Type ADL's   ?  ? Patient Education  ? Previous Diabetes Education Yes (please comment)   2016  ? Nutrition management  Meal options for control of blood glucose level and chronic complications.;Other (comment)   renal diet (no dialysis at this time)  ? Medications Reviewed patients medication for diabetes, action, purpose, timing of dose and side effects.   ?  ? Individualized Goals (developed by patient)  ? Nutrition Follow meal plan discussed   ? Medications take my medication as prescribed   ? Monitoring  test my blood glucose as discussed   ? Reducing Risk examine blood glucose patterns;do foot checks daily;treat hypoglycemia with 15 grams of carbs if blood glucose less than 70mg /dL   ?  ? Post-Education Assessment  ? Patient understands the diabetes disease and treatment process. Demonstrates understanding / competency   ? Patient understands incorporating nutritional management into lifestyle. Needs Review   ? Patient undertands incorporating physical activity into lifestyle. Demonstrates understanding / competency   ? Patient understands using medications  safely. Needs Review   ? Patient understands monitoring blood glucose, interpreting and using results Demonstrates understanding / competency   ? Patient understands prevention, detection, and treatment of acute complications. Demonstrates understanding / competency   ? Patient understands prevention, detection, and treatment of chronic complications. Demonstrates understanding / competency   ? Patient understands how to develop strategies to address psychosocial issues. Needs Review   ? Patient understands how to develop strategies to promote health/change behavior. Needs Review   ?  ? Outcomes  ? Expected Outcomes Demonstrated interest in learning. Expect positive outcomes   ? Future DMSE 2 months   ? Program Status Not Completed   ? ?  ?  ? ?  ? ? ?  Individualized Plan for Diabetes Self-Management Training:  ? ?Learning Objective:  Patient will have a greater understanding of diabetes self-management. ?Patient education plan is to attend individual and/or group sessions per assessed needs and concerns. ?  ?Plan:  ? ?Patient Instructions  ?Breakfast, lunch, and dinner daily ? Small meals with small amount of protein ?Drink water or unsweetened tea ?See the handout regarding what fruits and vegetables are best to eat. ?Small amounts of starch with your meals (bread, corn, grits, oatmeal, noodles, pasta, etc. - these portions should be small) ?Continue to follow a low salt diet ? ?I will contact your doctor about an order for PODs for the Omnipod Dash and Humalog in a vial as well as the reader for the [place] 2. ? ?Please call me when you have the PODS and Humalog in a vial and we can make an appointment to program the pump and start it. ? ?Expected Outcomes:  Demonstrated interest in learning. Expect positive outcomes ? ?Education material provided: Choose a Meal book with guidelines for no dialysis ?65 grams protein daily, low potassium, low sodium, low phosphorous ? ?If problems or questions, patient to contact  team via:  Phone ? ?Future DSME appointment: 2 months ? ? ? ? ?

## 2022-01-30 ENCOUNTER — Encounter (HOSPITAL_COMMUNITY)
Admission: RE | Admit: 2022-01-30 | Discharge: 2022-01-30 | Disposition: A | Discharge status: Home / Self Care | Payer: Medicare Other | Source: Ambulatory Visit | Attending: Nephrology | Admitting: Nephrology

## 2022-01-30 VITALS — BP 168/81 | HR 81 | Temp 97.4°F | Resp 18

## 2022-01-30 DIAGNOSIS — N185 Chronic kidney disease, stage 5: Secondary | ICD-10-CM | POA: Diagnosis present

## 2022-01-30 LAB — IRON AND TIBC
Iron: 77 ug/dL (ref 45–182)
Saturation Ratios: 32 % (ref 17.9–39.5)
TIBC: 244 ug/dL — ABNORMAL LOW (ref 250–450)
UIBC: 167 ug/dL

## 2022-01-30 LAB — FERRITIN: Ferritin: 182 ng/mL (ref 24–336)

## 2022-01-30 LAB — POCT HEMOGLOBIN-HEMACUE: Hemoglobin: 9.6 g/dL — ABNORMAL LOW (ref 13.0–17.0)

## 2022-01-30 MED ORDER — EPOETIN ALFA-EPBX 10000 UNIT/ML IJ SOLN
INTRAMUSCULAR | Status: AC
  Filled 2022-01-30: qty 1

## 2022-01-30 MED ORDER — EPOETIN ALFA-EPBX 10000 UNIT/ML IJ SOLN
10000.0000 [IU] | INTRAMUSCULAR | Status: DC
  Administered 2022-01-30: 10000 [IU] via SUBCUTANEOUS

## 2022-02-10 IMAGING — DX DG CHEST 2V
2 series · 2 of 2 positions shown · non-contrast
Comparison: March 20, 2021

CLINICAL DATA: Cough, congestion, COVID.

EXAM:
CHEST - 2 VIEW

[chest pa]
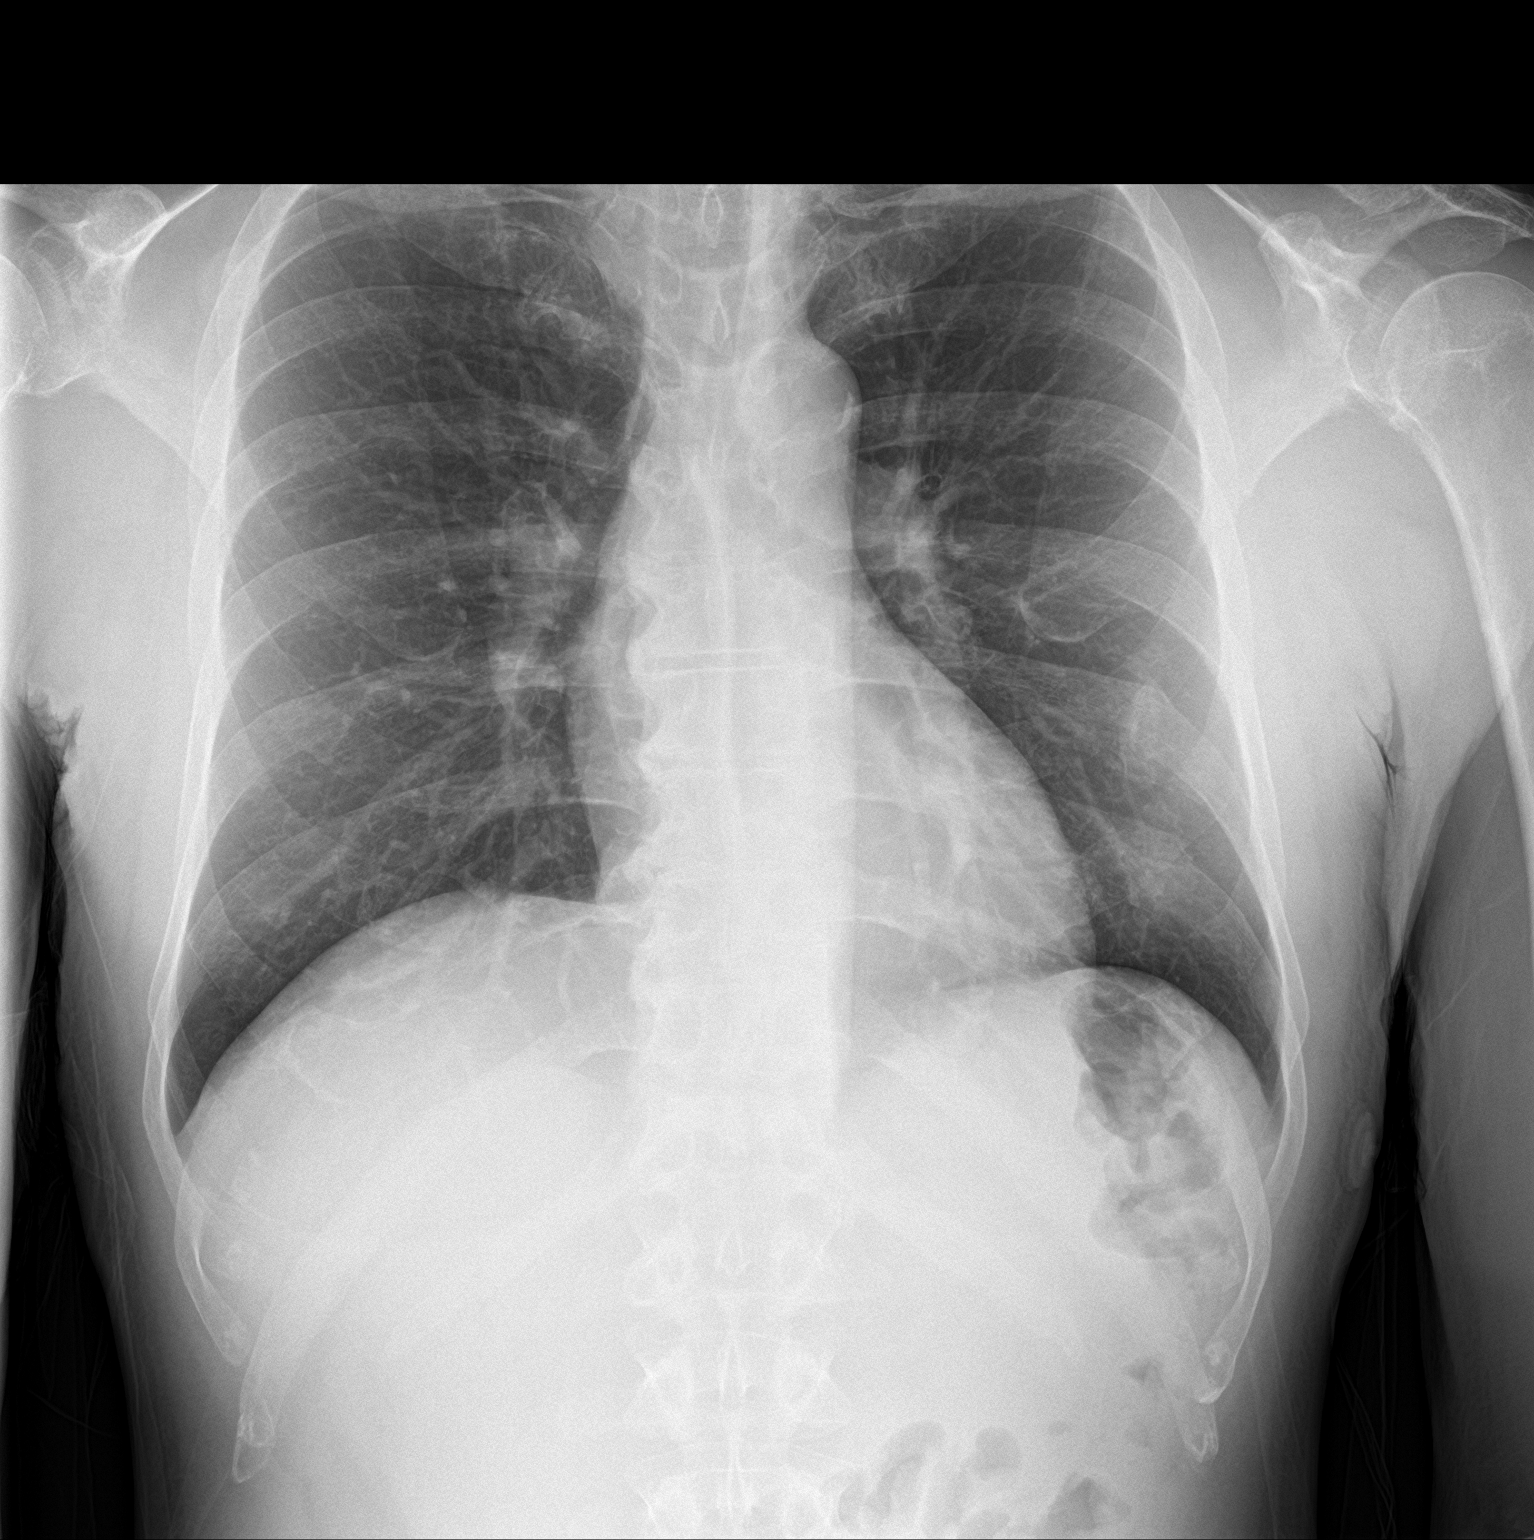

[chest lat]
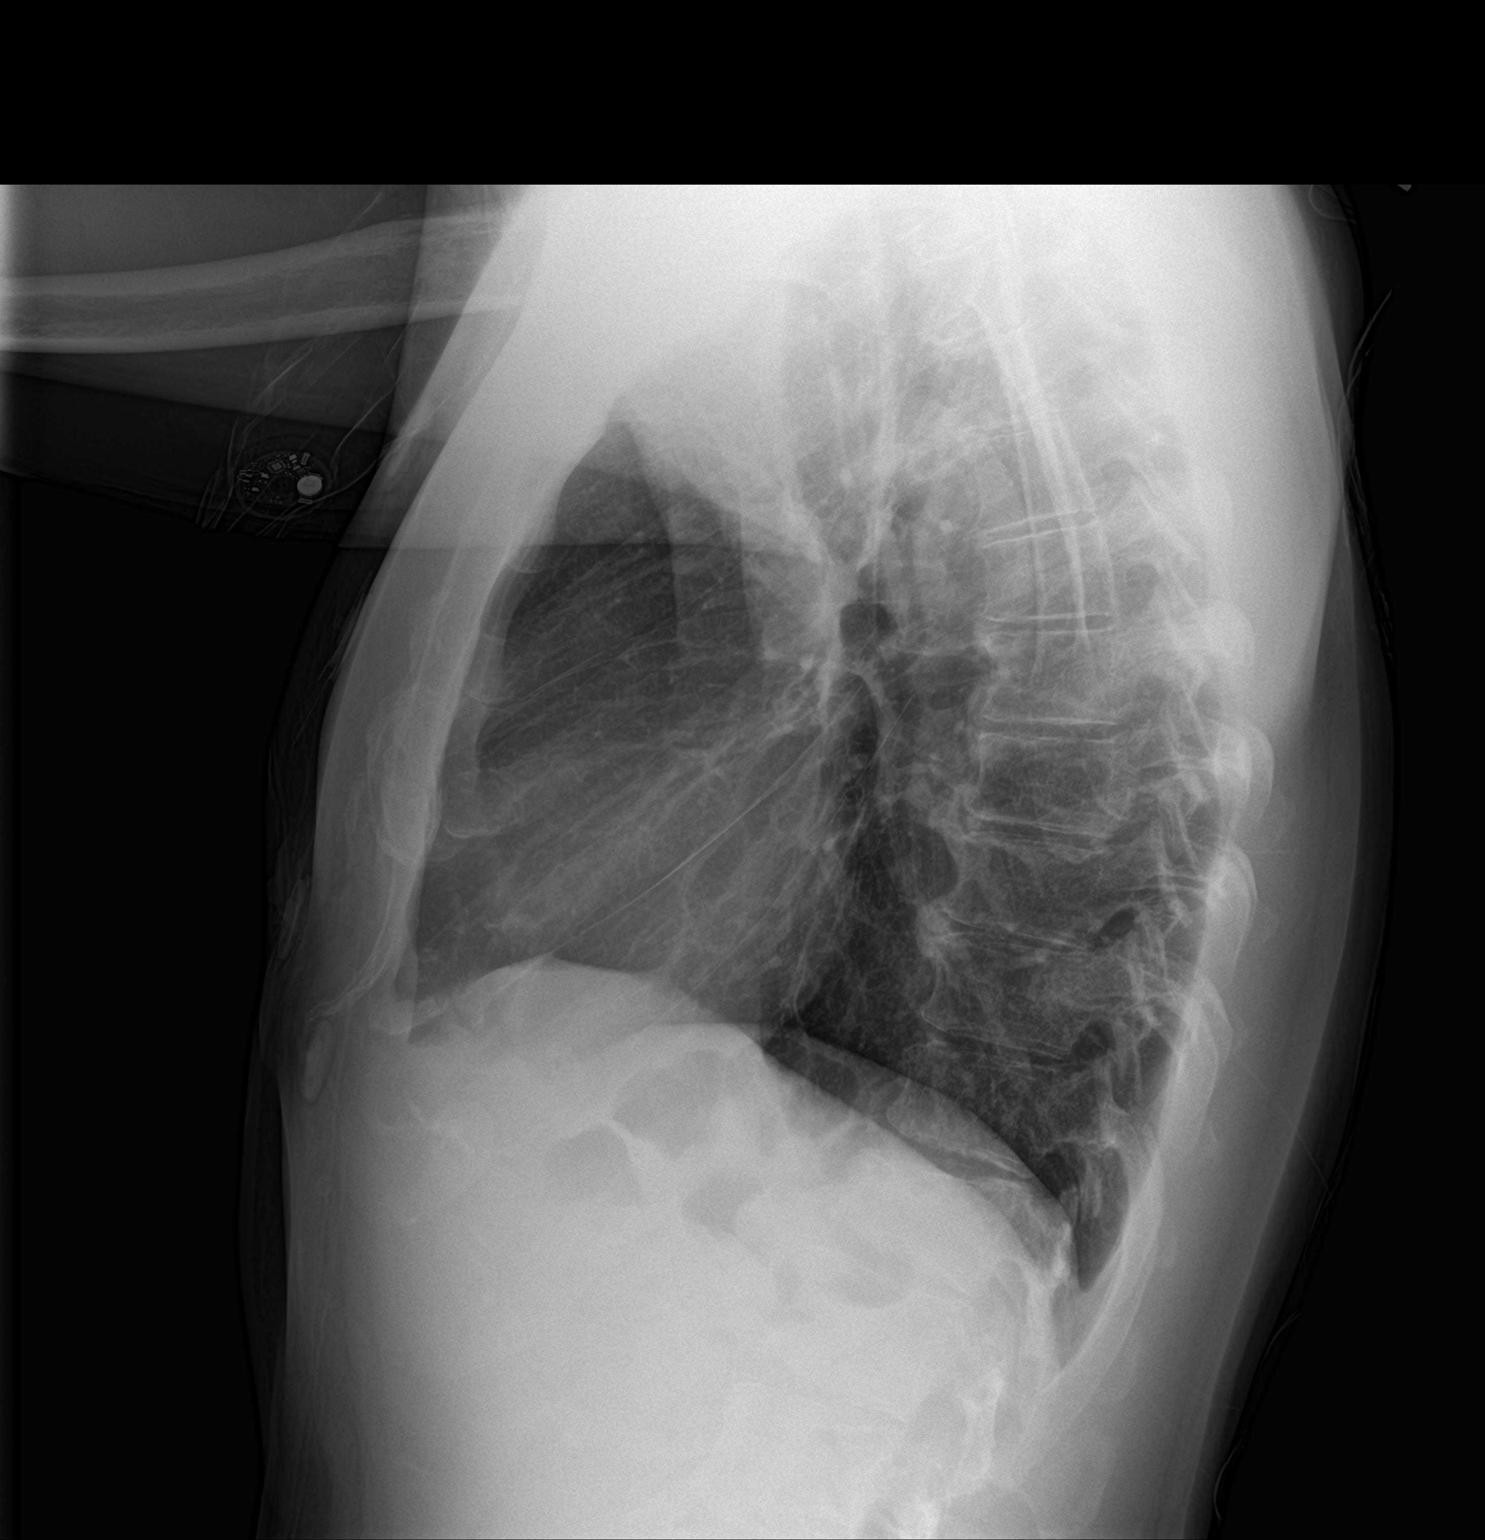

[2 of 2 positions shown; findings below may reference images not displayed]

FINDINGS: The heart size and mediastinal contours are within normal limits.
Both lungs are clear. Chronic posttraumatic changes of several lower
left ribs are stable. Degenerative joint changes of the spine are
noted.
IMPRESSION: No active cardiopulmonary disease.

## 2022-02-18 ENCOUNTER — Encounter (HOSPITAL_COMMUNITY): Payer: Self-pay | Admitting: Emergency Medicine

## 2022-02-18 ENCOUNTER — Inpatient Hospital Stay (HOSPITAL_COMMUNITY)
Admission: EM | Admit: 2022-02-18 | Discharge: 2022-02-21 | DRG: 312 | Disposition: A | Discharge status: Home / Self Care | Payer: Medicare Other | Attending: Internal Medicine | Admitting: Internal Medicine

## 2022-02-18 ENCOUNTER — Emergency Department (HOSPITAL_COMMUNITY): Payer: Medicare Other

## 2022-02-18 ENCOUNTER — Other Ambulatory Visit: Payer: Self-pay

## 2022-02-18 DIAGNOSIS — Z794 Long term (current) use of insulin: Secondary | ICD-10-CM | POA: Diagnosis not present

## 2022-02-18 DIAGNOSIS — E860 Dehydration: Secondary | ICD-10-CM | POA: Diagnosis present

## 2022-02-18 DIAGNOSIS — Z7902 Long term (current) use of antithrombotics/antiplatelets: Secondary | ICD-10-CM | POA: Diagnosis not present

## 2022-02-18 DIAGNOSIS — Z7982 Long term (current) use of aspirin: Secondary | ICD-10-CM | POA: Diagnosis not present

## 2022-02-18 DIAGNOSIS — I440 Atrioventricular block, first degree: Secondary | ICD-10-CM | POA: Diagnosis present

## 2022-02-18 DIAGNOSIS — E1065 Type 1 diabetes mellitus with hyperglycemia: Secondary | ICD-10-CM | POA: Diagnosis present

## 2022-02-18 DIAGNOSIS — E1022 Type 1 diabetes mellitus with diabetic chronic kidney disease: Secondary | ICD-10-CM | POA: Diagnosis present

## 2022-02-18 DIAGNOSIS — J90 Pleural effusion, not elsewhere classified: Secondary | ICD-10-CM | POA: Diagnosis not present

## 2022-02-18 DIAGNOSIS — I700 Atherosclerosis of aorta: Secondary | ICD-10-CM | POA: Diagnosis not present

## 2022-02-18 DIAGNOSIS — Z79899 Other long term (current) drug therapy: Secondary | ICD-10-CM | POA: Diagnosis not present

## 2022-02-18 DIAGNOSIS — I2510 Atherosclerotic heart disease of native coronary artery without angina pectoris: Secondary | ICD-10-CM | POA: Diagnosis not present

## 2022-02-18 DIAGNOSIS — Z91040 Latex allergy status: Secondary | ICD-10-CM

## 2022-02-18 DIAGNOSIS — R778 Other specified abnormalities of plasma proteins: Secondary | ICD-10-CM | POA: Diagnosis present

## 2022-02-18 DIAGNOSIS — Z888 Allergy status to other drugs, medicaments and biological substances status: Secondary | ICD-10-CM | POA: Diagnosis not present

## 2022-02-18 DIAGNOSIS — I248 Other forms of acute ischemic heart disease: Secondary | ICD-10-CM | POA: Diagnosis present

## 2022-02-18 DIAGNOSIS — Z8673 Personal history of transient ischemic attack (TIA), and cerebral infarction without residual deficits: Secondary | ICD-10-CM

## 2022-02-18 DIAGNOSIS — T50916A Underdosing of multiple unspecified drugs, medicaments and biological substances, initial encounter: Secondary | ICD-10-CM | POA: Diagnosis not present

## 2022-02-18 DIAGNOSIS — N19 Unspecified kidney failure: Secondary | ICD-10-CM

## 2022-02-18 DIAGNOSIS — E7800 Pure hypercholesterolemia, unspecified: Secondary | ICD-10-CM | POA: Diagnosis not present

## 2022-02-18 DIAGNOSIS — I1311 Hypertensive heart and chronic kidney disease without heart failure, with stage 5 chronic kidney disease, or end stage renal disease: Secondary | ICD-10-CM | POA: Diagnosis present

## 2022-02-18 DIAGNOSIS — J189 Pneumonia, unspecified organism: Secondary | ICD-10-CM

## 2022-02-18 DIAGNOSIS — E103593 Type 1 diabetes mellitus with proliferative diabetic retinopathy without macular edema, bilateral: Secondary | ICD-10-CM | POA: Diagnosis not present

## 2022-02-18 DIAGNOSIS — Z833 Family history of diabetes mellitus: Secondary | ICD-10-CM

## 2022-02-18 DIAGNOSIS — Z91138 Patient's unintentional underdosing of medication regimen for other reason: Secondary | ICD-10-CM

## 2022-02-18 DIAGNOSIS — I10 Essential (primary) hypertension: Secondary | ICD-10-CM | POA: Diagnosis present

## 2022-02-18 DIAGNOSIS — R55 Syncope and collapse: Principal | ICD-10-CM | POA: Diagnosis present

## 2022-02-18 DIAGNOSIS — E875 Hyperkalemia: Secondary | ICD-10-CM | POA: Diagnosis not present

## 2022-02-18 DIAGNOSIS — I120 Hypertensive chronic kidney disease with stage 5 chronic kidney disease or end stage renal disease: Secondary | ICD-10-CM | POA: Diagnosis present

## 2022-02-18 DIAGNOSIS — N185 Chronic kidney disease, stage 5: Secondary | ICD-10-CM | POA: Diagnosis not present

## 2022-02-18 DIAGNOSIS — J9811 Atelectasis: Secondary | ICD-10-CM | POA: Diagnosis not present

## 2022-02-18 DIAGNOSIS — Z1152 Encounter for screening for COVID-19: Secondary | ICD-10-CM

## 2022-02-18 DIAGNOSIS — R7989 Other specified abnormal findings of blood chemistry: Secondary | ICD-10-CM | POA: Diagnosis present

## 2022-02-18 LAB — CBC WITH DIFFERENTIAL/PLATELET
Abs Immature Granulocytes: 0.03 10*3/uL (ref 0.00–0.07)
Basophils Absolute: 0 10*3/uL (ref 0.0–0.1)
Basophils Relative: 0 %
Eosinophils Absolute: 0 10*3/uL (ref 0.0–0.5)
Eosinophils Relative: 0 %
HCT: 31.6 % — ABNORMAL LOW (ref 39.0–52.0)
Hemoglobin: 9.9 g/dL — ABNORMAL LOW (ref 13.0–17.0)
Immature Granulocytes: 0 %
Lymphocytes Relative: 7 %
Lymphs Abs: 0.7 10*3/uL (ref 0.7–4.0)
MCH: 29.5 pg (ref 26.0–34.0)
MCHC: 31.3 g/dL (ref 30.0–36.0)
MCV: 94 fL (ref 80.0–100.0)
Monocytes Absolute: 0.7 10*3/uL (ref 0.1–1.0)
Monocytes Relative: 7 %
Neutro Abs: 8.1 10*3/uL — ABNORMAL HIGH (ref 1.7–7.7)
Neutrophils Relative %: 86 %
Platelets: 211 10*3/uL (ref 150–400)
RBC: 3.36 MIL/uL — ABNORMAL LOW (ref 4.22–5.81)
RDW: 14.3 % (ref 11.5–15.5)
WBC: 9.5 10*3/uL (ref 4.0–10.5)
nRBC: 0 % (ref 0.0–0.2)

## 2022-02-18 LAB — COMPREHENSIVE METABOLIC PANEL
ALT: 28 U/L (ref 0–44)
AST: 33 U/L (ref 15–41)
Albumin: 3.4 g/dL — ABNORMAL LOW (ref 3.5–5.0)
Alkaline Phosphatase: 81 U/L (ref 38–126)
Anion gap: 9 (ref 5–15)
BUN: 55 mg/dL — ABNORMAL HIGH (ref 6–20)
CO2: 21 mmol/L — ABNORMAL LOW (ref 22–32)
Calcium: 9 mg/dL (ref 8.9–10.3)
Chloride: 111 mmol/L (ref 98–111)
Creatinine, Ser: 5.24 mg/dL — ABNORMAL HIGH (ref 0.61–1.24)
GFR, Estimated: 12 mL/min — ABNORMAL LOW (ref >60)
Glucose, Bld: 256 mg/dL — ABNORMAL HIGH (ref 70–99)
Potassium: 5.3 mmol/L — ABNORMAL HIGH (ref 3.5–5.1)
Sodium: 141 mmol/L (ref 135–145)
Total Bilirubin: 1.2 mg/dL (ref 0.3–1.2)
Total Protein: 6.5 g/dL (ref 6.5–8.1)

## 2022-02-18 LAB — URINALYSIS, ROUTINE W REFLEX MICROSCOPIC
Bacteria, UA: NONE SEEN
Bilirubin Urine: NEGATIVE
Glucose, UA: >=500 mg/dL — AB
Ketones, ur: NEGATIVE mg/dL
Leukocytes,Ua: NEGATIVE
Nitrite: NEGATIVE
Protein, ur: >=300 mg/dL — AB
Specific Gravity, Urine: 1.015 (ref 1.005–1.030)
pH: 5 (ref 5.0–8.0)

## 2022-02-18 LAB — BLOOD GAS, VENOUS
Acid-base deficit: 6.2 mmol/L — ABNORMAL HIGH (ref 0.0–2.0)
Bicarbonate: 19.6 mmol/L — ABNORMAL LOW (ref 20.0–28.0)
O2 Saturation: 75.3 %
Patient temperature: 37
pCO2, Ven: 39 mmHg — ABNORMAL LOW (ref 44–60)
pH, Ven: 7.31 (ref 7.25–7.43)
pO2, Ven: 43 mmHg (ref 32–45)

## 2022-02-18 LAB — GLUCOSE, CAPILLARY: Glucose-Capillary: 265 mg/dL — ABNORMAL HIGH (ref 70–99)

## 2022-02-18 LAB — RESP PANEL BY RT-PCR (FLU A&B, COVID) ARPGX2
Influenza A by PCR: NEGATIVE
Influenza B by PCR: NEGATIVE
SARS Coronavirus 2 by RT PCR: NEGATIVE

## 2022-02-18 LAB — CK: Total CK: 335 U/L (ref 49–397)

## 2022-02-18 LAB — CBG MONITORING, ED
Glucose-Capillary: 203 mg/dL — ABNORMAL HIGH (ref 70–99)
Glucose-Capillary: 200 mg/dL — ABNORMAL HIGH (ref 70–99)

## 2022-02-18 LAB — TROPONIN I (HIGH SENSITIVITY)
Troponin I (High Sensitivity): 200 ng/L (ref <18)
Troponin I (High Sensitivity): 134 ng/L (ref <18)

## 2022-02-18 LAB — BRAIN NATRIURETIC PEPTIDE: B Natriuretic Peptide: 1495.2 pg/mL — ABNORMAL HIGH (ref 0.0–100.0)

## 2022-02-18 LAB — AMMONIA: Ammonia: 13 umol/L (ref 9–35)

## 2022-02-18 MED ORDER — HEPARIN SODIUM (PORCINE) 5000 UNIT/ML IJ SOLN
5000.0000 [IU] | Freq: Three times a day (TID) | INTRAMUSCULAR | Status: DC
  Administered 2022-02-19 – 2022-02-21 (×8): 5000 [IU] via SUBCUTANEOUS
  Filled 2022-02-18 (×8): qty 1

## 2022-02-18 MED ORDER — INSULIN ASPART 100 UNIT/ML IJ SOLN
3.0000 [IU] | Freq: Three times a day (TID) | INTRAMUSCULAR | Status: DC
  Administered 2022-02-19 – 2022-02-21 (×8): 3 [IU] via SUBCUTANEOUS

## 2022-02-18 MED ORDER — SODIUM CHLORIDE 0.9 % IV SOLN
2.0000 g | INTRAVENOUS | Status: DC
  Administered 2022-02-19 – 2022-02-20 (×2): 2 g via INTRAVENOUS
  Filled 2022-02-18 (×3): qty 20

## 2022-02-18 MED ORDER — SODIUM CHLORIDE 0.9% FLUSH
3.0000 mL | Freq: Two times a day (BID) | INTRAVENOUS | Status: DC
  Administered 2022-02-19 – 2022-02-20 (×4): 3 mL via INTRAVENOUS

## 2022-02-18 MED ORDER — ASPIRIN 81 MG PO TBEC
81.0000 mg | DELAYED_RELEASE_TABLET | Freq: Every morning | ORAL | Status: DC
  Administered 2022-02-19 – 2022-02-21 (×3): 81 mg via ORAL
  Filled 2022-02-18 (×3): qty 1

## 2022-02-18 MED ORDER — SODIUM CHLORIDE 0.9 % IV SOLN
500.0000 mg | Freq: Once | INTRAVENOUS | Status: AC
  Administered 2022-02-18: 500 mg via INTRAVENOUS
  Filled 2022-02-18: qty 5

## 2022-02-18 MED ORDER — ACETAMINOPHEN 650 MG RE SUPP: 650.0000 mg | Freq: Four times a day (QID) | RECTAL | Status: DC | PRN

## 2022-02-18 MED ORDER — CLOPIDOGREL BISULFATE 75 MG PO TABS
75.0000 mg | ORAL_TABLET | Freq: Every morning | ORAL | Status: DC
  Administered 2022-02-19 – 2022-02-21 (×3): 75 mg via ORAL
  Filled 2022-02-18 (×3): qty 1

## 2022-02-18 MED ORDER — SODIUM CHLORIDE 0.9 % IV SOLN: 250.0000 mL | INTRAVENOUS | Status: DC | PRN

## 2022-02-18 MED ORDER — SODIUM CHLORIDE 0.9% FLUSH: 3.0000 mL | INTRAVENOUS | Status: DC | PRN

## 2022-02-18 MED ORDER — LABETALOL HCL 100 MG PO TABS
100.0000 mg | ORAL_TABLET | Freq: Two times a day (BID) | ORAL | Status: DC
  Administered 2022-02-19: 100 mg via ORAL
  Filled 2022-02-18 (×2): qty 1

## 2022-02-18 MED ORDER — INSULIN ASPART 100 UNIT/ML IJ SOLN
0.0000 [IU] | Freq: Three times a day (TID) | INTRAMUSCULAR | Status: DC
  Administered 2022-02-19: 2 [IU] via SUBCUTANEOUS
  Administered 2022-02-19: 3 [IU] via SUBCUTANEOUS
  Administered 2022-02-19: 4 [IU] via SUBCUTANEOUS
  Administered 2022-02-20 (×2): 6 [IU] via SUBCUTANEOUS

## 2022-02-18 MED ORDER — INSULIN ASPART 100 UNIT/ML IJ SOLN: 0.0000 [IU] | Freq: Every day | INTRAMUSCULAR | Status: DC

## 2022-02-18 MED ORDER — SODIUM CHLORIDE 0.9 % IV BOLUS
1000.0000 mL | Freq: Once | INTRAVENOUS | Status: AC
  Administered 2022-02-18: 1000 mL via INTRAVENOUS

## 2022-02-18 MED ORDER — INSULIN GLARGINE-YFGN 100 UNIT/ML ~~LOC~~ SOLN
6.0000 [IU] | Freq: Every day | SUBCUTANEOUS | Status: DC
  Administered 2022-02-19 (×2): 6 [IU] via SUBCUTANEOUS
  Filled 2022-02-18 (×3): qty 0.06

## 2022-02-18 MED ORDER — SODIUM CHLORIDE 0.9% FLUSH
3.0000 mL | Freq: Two times a day (BID) | INTRAVENOUS | Status: DC
  Administered 2022-02-19 – 2022-02-20 (×4): 3 mL via INTRAVENOUS

## 2022-02-18 MED ORDER — SODIUM CHLORIDE 0.9 % IV SOLN
1.0000 g | Freq: Once | INTRAVENOUS | Status: AC
  Administered 2022-02-18: 1 g via INTRAVENOUS
  Filled 2022-02-18: qty 10

## 2022-02-18 MED ORDER — ATORVASTATIN CALCIUM 40 MG PO TABS
80.0000 mg | ORAL_TABLET | Freq: Every morning | ORAL | Status: DC
  Administered 2022-02-19 – 2022-02-21 (×3): 80 mg via ORAL
  Filled 2022-02-18 (×3): qty 2

## 2022-02-18 MED ORDER — AMLODIPINE BESYLATE 5 MG PO TABS
10.0000 mg | ORAL_TABLET | Freq: Every morning | ORAL | Status: DC
  Administered 2022-02-19 – 2022-02-21 (×3): 10 mg via ORAL
  Filled 2022-02-18 (×3): qty 2

## 2022-02-18 MED ORDER — SENNOSIDES-DOCUSATE SODIUM 8.6-50 MG PO TABS: 1.0000 | ORAL_TABLET | Freq: Every evening | ORAL | Status: DC | PRN

## 2022-02-18 MED ORDER — ACETAMINOPHEN 325 MG PO TABS: 650.0000 mg | ORAL_TABLET | Freq: Four times a day (QID) | ORAL | Status: DC | PRN

## 2022-02-18 MED ORDER — AZITHROMYCIN 250 MG PO TABS
500.0000 mg | ORAL_TABLET | Freq: Every day | ORAL | Status: DC
  Administered 2022-02-19 – 2022-02-21 (×3): 500 mg via ORAL
  Filled 2022-02-18 (×3): qty 2
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
## 2022-02-18 NOTE — ED Provider Notes (Signed)
[facility] DEPT Provider Note   CSN: [id number] Arrival date & time: 02/18/22  1714     History  Chief Complaint  Patient presents with   Loss of Consciousness    Louis Peters is a 56 y.o. male history of type 1 diabetes here presenting with loss of consciousness.  Patient states that he is sick with a respiratory illness for the last several days.  He states that he has been in bed for 2 days and has not been eating or drinking that much.  He states that he was talking to his son today and almost " fell out".  EMS was called and he was noted to be hypertensive and blood sugar was around 380.  He then refused transport and took his blood pressure medicines and his insulin.  He then walked to the mailbox and then fell out 2 more times.  EMS was called again and patient was noted to have a blood pressure 160s and glucose of 350.   The history is provided by the patient.      Home Medications Prior to Admission medications   Medication Sig Start Date End Date Taking? Authorizing Provider  amLODipine (NORVASC) 10 MG tablet Take 10 mg by mouth every morning. 01/16/17   [provider]  ammonium lactate (LAC-HYDRIN) 12 % lotion Apply 1 application topically daily as needed for dry skin. 10/01/18   [provider]  aspirin EC 81 MG EC tablet Take 1 tablet (81 mg total) by mouth daily. Swallow whole. Patient taking differently: Take 81 mg by mouth every morning. Swallow whole. 07/24/20   Geradine Girt, DO  atorvastatin (LIPITOR) 80 MG tablet Take 1 tablet (80 mg total) by mouth daily. Patient taking differently: Take 80 mg by mouth every morning. 07/24/20   Geradine Girt, DO  Blood Glucose Monitoring Suppl ([place]) w/Device KIT by Does not apply route. 07/31/18   [provider]  cholecalciferol (VITAMIN D3) 25 MCG (1000 UNIT) tablet Take 1,000 Units by mouth every morning.    [provider]   clopidogrel (PLAVIX) 75 MG tablet Take 75 mg by mouth every morning. 11/28/20   [provider]  Continuous Blood Gluc Receiver (FREESTYLE LIBRE 2 READER) DEVI by Does not apply route. 04/08/20   [provider]  Continuous Blood Gluc Sensor (FREESTYLE LIBRE 2 SENSOR) MISC by Does not apply route. 04/14/21   [provider]  epoetin alfa-epbx (RETACRIT) 97530 UNIT/ML injection 10,000 Units 3 (three) times a week.    [provider]  Glucagon (BAQSIMI ONE PACK) 3 MG/DOSE POWD Place 3 mg into the nose once as needed (low blood sugar). Patient not taking: Reported on 01/13/2022    [provider]  hydrALAZINE (APRESOLINE) 100 MG tablet Take 100 mg by mouth 3 (three) times daily.    [provider]  insulin degludec (TRESIBA) 100 UNIT/ML SOPN FlexTouch Pen Inject 12 Units into the skin at bedtime. 04/30/19   [provider]  insulin lispro (HUMALOG KWIKPEN) 200 UNIT/ML KwikPen Inject 5-8 Units into the skin See admin instructions. Inject 5 units subcutaneously before breakfast and lunch, inject 8 units before supper    [provider]  Insulin Pen Needle 32G X 4 MM MISC Use with lantus and humalog 4 imes per day 07/26/17   [provider]  labetalol (NORMODYNE) 100 MG tablet Take 100 mg by mouth 2 (two) times daily with breakfast and lunch. 12/08/20  [provider]  ondansetron (ZOFRAN-ODT) 4 MG disintegrating tablet Take 1 tablet (4 mg total) by mouth every 8 (eight) hours as needed for nausea or vomiting. Patient not taking: Reported on 01/13/2022 10/01/21   Fredia Sorrow, MD  ONE TOUCH LANCETS MISC Use to check blood sugar 8 time(s) daily 07/31/18   [provider]  Roma Schanz test strip SMARTSIG:Via Meter 8 Times Daily 12/04/20   [provider]      Allergies    Fexofenadine and Latex    Review of Systems   Review of Systems  Neurological:  Positive for dizziness.  All other systems  reviewed and are negative.  Physical Exam Updated Vital Signs BP 134/62 (BP Location: Left Arm)   Pulse 88   Temp 98.3 F (36.8 C) (Oral)   Resp 20   SpO2 96%  Physical Exam Vitals and nursing note reviewed.  Constitutional:      Comments: Slightly dehydrated   HENT:     Head: Normocephalic.     Nose: Nose normal.     Mouth/Throat:     Mouth: Mucous membranes are dry.  Eyes:     Extraocular Movements: Extraocular movements intact.     Pupils: Pupils are equal, round, and reactive to light.  Cardiovascular:     Rate and Rhythm: Normal rate and regular rhythm.     Pulses: Normal pulses.     Heart sounds: Normal heart sounds.  Pulmonary:     Effort: Pulmonary effort is normal.     Breath sounds: Normal breath sounds.  Abdominal:     General: Abdomen is flat.     Palpations: Abdomen is soft.  Musculoskeletal:        General: Normal range of motion.     Cervical back: Normal range of motion.  Skin:    General: Skin is warm.     Capillary Refill: Capillary refill takes less than 2 seconds.  Neurological:     General: No focal deficit present.     Mental Status: He is oriented to person, place, and time.     Comments: CN 2- 12 intact. Nl strength throughout.  Patient has normal sensation throughout   Psychiatric:        Mood and Affect: Mood normal.        Behavior: Behavior normal.    ED Results / Procedures / Treatments   Labs (all labs ordered are listed, but only abnormal results are displayed) Labs Reviewed  CBC WITH DIFFERENTIAL/PLATELET - Abnormal; Notable for the following components:      Result Value   RBC 3.36 (*)    Hemoglobin 9.9 (*)    HCT 31.6 (*)    Neutro Abs 8.1 (*)    All other components within normal limits  COMPREHENSIVE METABOLIC PANEL - Abnormal; Notable for the following components:   Potassium 5.3 (*)    CO2 21 (*)    Glucose, Bld 256 (*)    BUN 55 (*)    Creatinine, Ser 5.24 (*)    Albumin 3.4 (*)    GFR, Estimated 12 (*)    All  other components within normal limits  URINALYSIS, ROUTINE W REFLEX MICROSCOPIC - Abnormal; Notable for the following components:   Glucose, UA >=500 (*)    Hgb urine dipstick SMALL (*)    Protein, ur >=300 (*)    All other components within normal limits  BLOOD GAS, VENOUS - Abnormal; Notable for the following components:   pCO2, Ven 39 (*)  Bicarbonate 19.6 (*)    Acid-base deficit 6.2 (*)    All other components within normal limits  CBG MONITORING, ED - Abnormal; Notable for the following components:   Glucose-Capillary 203 (*)    All other components within normal limits  CBG MONITORING, ED - Abnormal; Notable for the following components:   Glucose-Capillary 200 (*)    All other components within normal limits  TROPONIN I (HIGH SENSITIVITY) - Abnormal; Notable for the following components:   Troponin I (High Sensitivity) 200 (*)    All other components within normal limits  TROPONIN I (HIGH SENSITIVITY) - Abnormal; Notable for the following components:   Troponin I (High Sensitivity) 134 (*)    All other components within normal limits  RESP PANEL BY RT-PCR (FLU A&B, COVID) ARPGX2  CULTURE, BLOOD (ROUTINE X 2)  CULTURE, BLOOD (ROUTINE X 2)  CK  BRAIN NATRIURETIC PEPTIDE  AMMONIA  LACTIC ACID, PLASMA  LACTIC ACID, PLASMA    EKG EKG Interpretation  Date/Time:  Saturday Feb 18 2022 17:30:13 EDT Ventricular Rate:  89 PR Interval:  205 QRS Duration: 86 QT Interval:  388 QTC Calculation: 473 R Axis:   79 Text Interpretation: Sinus rhythm Borderline prolonged PR interval Low voltage, precordial leads No significant change since last tracing Confirmed by Wandra Arthurs [phone]) on 02/18/2022 5:34:04 PM  Radiology CT HEAD WO CONTRAST (5MM)  Result Date: 02/18/2022 CLINICAL DATA:  Altered mental status. EXAM: CT HEAD WITHOUT CONTRAST TECHNIQUE: Contiguous axial images were obtained from the base of the skull through the vertex without intravenous contrast. RADIATION DOSE  REDUCTION: This exam was performed according to the departmental dose-optimization program which includes automated exposure control, adjustment of the mA and/or kV according to patient size and/or use of iterative reconstruction technique. COMPARISON:  03/20/2021 FINDINGS: Brain: No evidence of intracranial hemorrhage, acute infarction, hydrocephalus, extra-axial collection, or mass lesion/mass effect. Stable mild generalized cerebral atrophy and chronic small vessel disease. Vascular:  No hyperdense vessel or other acute findings. Skull: No evidence of fracture or other significant bone abnormality. Sinuses/Orbits: No acute findings. Chronic opacification of the left frontal and bilateral ethmoid sinuses is again seen. Mucosal thickening noted in the right maxillary sinus. Other: None. IMPRESSION: No acute intracranial abnormality. Stable mild cerebral atrophy and chronic small vessel disease. Chronic sinusitis. Electronically Signed   By: Marlaine Hind M.D.   On: 02/18/2022 17:41   CT CHEST WO CONTRAST  Result Date: 02/18/2022 CLINICAL DATA:  Dyspnea, chronic, unclear etiology SOB, possible pericardial effusion, patient has Cr of 5 EXAM: CT CHEST WITHOUT CONTRAST TECHNIQUE: Multidetector CT imaging of the chest was performed following the standard protocol without IV contrast. RADIATION DOSE REDUCTION: This exam was performed according to the departmental dose-optimization program which includes automated exposure control, adjustment of the mA and/or kV according to patient size and/or use of iterative reconstruction technique. COMPARISON:  Same day chest x-ray, abdominal CT 04/12/2021 FINDINGS: Cardiovascular: Heart is mildly enlarged. Small pericardial effusion, stable in size from prior CT. Thoracic aorta is nonaneurysmal. Scattered atherosclerotic calcifications of the aorta and coronary arteries. Pulmonary trunk is upper limits of normal in size. Mediastinum/Nodes: No axillary or mediastinal  lymphadenopathy. Evaluation of the hilar structures is limited in the absence of intravenous contrast. Within this limitation, no obvious hilar adenopathy or mass is identified. Thyroid, trachea, and esophagus demonstrate no significant findings. Lungs/Pleura: Small layering bilateral pleural effusions with associated bibasilar airspace consolidation. Remaining lung fields are otherwise clear. No pneumothorax. Upper Abdomen: No acute abnormality.  Musculoskeletal: Diffuse anasarca. Chronic superior endplate compression fracture of T12. No new or acute osseous findings. IMPRESSION: 1. Small layering bilateral pleural effusions with associated bibasilar airspace consolidation, which may represent atelectasis or pneumonia. 2. Mild cardiomegaly with small pericardial effusion, stable in size from prior CT. 3. Diffuse anasarca. 4. Aortic and coronary artery atherosclerosis (ICD10-I70.0). Electronically Signed   By: Davina Poke D.O.   On: 02/18/2022 20:41   DG Chest Port 1 View  Result Date: 02/18/2022 CLINICAL DATA:  Cough with shortness of breath. EXAM: PORTABLE CHEST 1 VIEW COMPARISON:  Chest x-ray 10/01/2021 FINDINGS: The cardiac silhouette appears mildly enlarged, a new finding. This may be partially projectional secondary to lordotic positioning. There is no lung consolidation, pleural effusion or pneumothorax visualized. Osseous structures are within normal limits. IMPRESSION: 1. Mild enlargement of the cardiac silhouette, new from prior. This may be projectional, but new cardiomegaly or pericardial effusion can not be excluded. Consider further evaluation with upright PA and lateral chest x-ray. Electronically Signed   By: Ronney Asters M.D.   On: 02/18/2022 18:42    Procedures Procedures    CRITICAL CARE Performed by: Wandra Arthurs   Total critical care time: 30 minutes  Critical care time was exclusive of separately billable procedures and treating other patients.  Critical care was necessary  to treat or prevent imminent or life-threatening deterioration.  Critical care was time spent personally by me on the following activities: development of treatment plan with patient and/or surrogate as well as nursing, discussions with consultants, evaluation of patient's response to treatment, examination of patient, obtaining history from patient or surrogate, ordering and performing treatments and interventions, ordering and review of laboratory studies, ordering and review of radiographic studies, pulse oximetry and re-evaluation of patient's condition.   Medications Ordered in ED Medications  cefTRIAXone (ROCEPHIN) 1 g in sodium chloride 0.9 % 100 mL IVPB (has no administration in time range)  azithromycin (ZITHROMAX) 500 mg in sodium chloride 0.9 % 250 mL IVPB (has no administration in time range)  sodium chloride 0.9 % bolus 1,000 mL (1,000 mLs Intravenous New Bag/Given 02/18/22 1816)    ED Course/ Medical Decision Making/ A&P                           Medical Decision Making TEAGUE GOYNES is a 56 y.o. male here presenting with near syncope.  Patient has not been eating and drinking much for the last several days and has not been taking his meds.  Concern for possible DKA versus dehydration versus orthostasis versus head bleed. Plan to get CT head and CBC and CMP and troponin and chest x-ray and COVID test.  Will hydrate and reassess  8:55 PM I reviewed patient's labs and independently reviewed his imaging.  Patient has elevated troponin of 200.  Second troponin decreased to 130.  Patient had possible enlarged mediastinum on the chest x-ray but CT scan showed mild cardiomegaly with only small pericardial effusion.  Patient also has bilateral pleural effusions and possible pneumonia.  Given cough and shortness of breath, concerned that he may have pneumonia causing his symptoms.  He is white blood cell count is normal currently.  I added antibiotics and lactate and cultures.  I discussed  case with cardiology (Dr. Marcelle Smiling).  He states that he recommended an echo and cardiology will see as a consult.  He thinks elevated troponin likely demand ischemia.  Patient is not a dialysis patient and has  a graft that is not mature yet.  He had talked to nephrology multiple times and is very hesitant about starting dialysis right now.  At this point, patient will be admitted for syncope likely multifactorial-CKD, elevated troponin, pneumonia.   Problems Addressed: Community acquired pneumonia, unspecified laterality: acute illness or injury Renal failure, unspecified chronicity: chronic illness or injury with exacerbation, progression, or side effects of treatment Syncope, unspecified syncope type: acute illness or injury  Amount and/or Complexity of Data Reviewed Labs: ordered. Decision-making details documented in ED Course. Radiology: ordered and independent interpretation performed. Decision-making details documented in ED Course. ECG/medicine tests: ordered and independent interpretation performed. Decision-making details documented in ED Course.  Risk Prescription drug management. Decision regarding hospitalization.    Final Clinical Impression(s) / ED Diagnoses Final diagnoses:  None    Rx / DC Orders ED Discharge Orders     None         Drenda Freeze, MD 02/18/22 2215

## 2022-02-18 NOTE — ED Triage Notes (Signed)
BIB [facility]  Per EMS: Pt coming from home w/ 3 syncopal episodes today. Denies LOC, Denies hitting head, denies thinners. CBG 360  Hypertensive at 1st but last BP 160s/80s  VSS

## 2022-02-18 NOTE — ED Notes (Signed)
Patient transported to CT 

## 2022-02-19 ENCOUNTER — Observation Stay (HOSPITAL_COMMUNITY): Payer: Medicare Other

## 2022-02-19 DIAGNOSIS — J90 Pleural effusion, not elsewhere classified: Secondary | ICD-10-CM | POA: Diagnosis present

## 2022-02-19 DIAGNOSIS — T50916A Underdosing of multiple unspecified drugs, medicaments and biological substances, initial encounter: Secondary | ICD-10-CM | POA: Diagnosis present

## 2022-02-19 DIAGNOSIS — R778 Other specified abnormalities of plasma proteins: Secondary | ICD-10-CM

## 2022-02-19 DIAGNOSIS — E1065 Type 1 diabetes mellitus with hyperglycemia: Secondary | ICD-10-CM | POA: Diagnosis present

## 2022-02-19 DIAGNOSIS — E860 Dehydration: Secondary | ICD-10-CM | POA: Diagnosis present

## 2022-02-19 DIAGNOSIS — R55 Syncope and collapse: Secondary | ICD-10-CM | POA: Diagnosis present

## 2022-02-19 DIAGNOSIS — I248 Other forms of acute ischemic heart disease: Secondary | ICD-10-CM | POA: Diagnosis present

## 2022-02-19 DIAGNOSIS — Z888 Allergy status to other drugs, medicaments and biological substances status: Secondary | ICD-10-CM | POA: Diagnosis not present

## 2022-02-19 DIAGNOSIS — Z794 Long term (current) use of insulin: Secondary | ICD-10-CM | POA: Diagnosis not present

## 2022-02-19 DIAGNOSIS — E7800 Pure hypercholesterolemia, unspecified: Secondary | ICD-10-CM | POA: Diagnosis present

## 2022-02-19 DIAGNOSIS — R7989 Other specified abnormal findings of blood chemistry: Secondary | ICD-10-CM

## 2022-02-19 DIAGNOSIS — E875 Hyperkalemia: Secondary | ICD-10-CM | POA: Diagnosis present

## 2022-02-19 DIAGNOSIS — E103593 Type 1 diabetes mellitus with proliferative diabetic retinopathy without macular edema, bilateral: Secondary | ICD-10-CM | POA: Diagnosis present

## 2022-02-19 DIAGNOSIS — I2510 Atherosclerotic heart disease of native coronary artery without angina pectoris: Secondary | ICD-10-CM | POA: Diagnosis present

## 2022-02-19 DIAGNOSIS — I440 Atrioventricular block, first degree: Secondary | ICD-10-CM | POA: Diagnosis present

## 2022-02-19 DIAGNOSIS — I700 Atherosclerosis of aorta: Secondary | ICD-10-CM | POA: Diagnosis present

## 2022-02-19 DIAGNOSIS — I1311 Hypertensive heart and chronic kidney disease without heart failure, with stage 5 chronic kidney disease, or end stage renal disease: Secondary | ICD-10-CM | POA: Diagnosis present

## 2022-02-19 DIAGNOSIS — N185 Chronic kidney disease, stage 5: Secondary | ICD-10-CM | POA: Diagnosis present

## 2022-02-19 DIAGNOSIS — J9811 Atelectasis: Secondary | ICD-10-CM | POA: Diagnosis present

## 2022-02-19 DIAGNOSIS — Z91040 Latex allergy status: Secondary | ICD-10-CM | POA: Diagnosis not present

## 2022-02-19 DIAGNOSIS — Z7902 Long term (current) use of antithrombotics/antiplatelets: Secondary | ICD-10-CM | POA: Diagnosis not present

## 2022-02-19 DIAGNOSIS — Z79899 Other long term (current) drug therapy: Secondary | ICD-10-CM | POA: Diagnosis not present

## 2022-02-19 DIAGNOSIS — Z1152 Encounter for screening for COVID-19: Secondary | ICD-10-CM | POA: Diagnosis not present

## 2022-02-19 DIAGNOSIS — Z833 Family history of diabetes mellitus: Secondary | ICD-10-CM | POA: Diagnosis not present

## 2022-02-19 DIAGNOSIS — E1022 Type 1 diabetes mellitus with diabetic chronic kidney disease: Secondary | ICD-10-CM | POA: Diagnosis present

## 2022-02-19 DIAGNOSIS — Z7982 Long term (current) use of aspirin: Secondary | ICD-10-CM | POA: Diagnosis not present

## 2022-02-19 LAB — BASIC METABOLIC PANEL
Anion gap: 8 (ref 5–15)
BUN: 60 mg/dL — ABNORMAL HIGH (ref 6–20)
CO2: 20 mmol/L — ABNORMAL LOW (ref 22–32)
Calcium: 8.8 mg/dL — ABNORMAL LOW (ref 8.9–10.3)
Chloride: 111 mmol/L (ref 98–111)
Creatinine, Ser: 6.1 mg/dL — ABNORMAL HIGH (ref 0.61–1.24)
GFR, Estimated: 10 mL/min — ABNORMAL LOW (ref >60)
Glucose, Bld: 300 mg/dL — ABNORMAL HIGH (ref 70–99)
Potassium: 5.4 mmol/L — ABNORMAL HIGH (ref 3.5–5.1)
Sodium: 139 mmol/L (ref 135–145)

## 2022-02-19 LAB — CBC
HCT: 28.1 % — ABNORMAL LOW (ref 39.0–52.0)
Hemoglobin: 8.8 g/dL — ABNORMAL LOW (ref 13.0–17.0)
MCH: 29 pg (ref 26.0–34.0)
MCHC: 31.3 g/dL (ref 30.0–36.0)
MCV: 92.7 fL (ref 80.0–100.0)
Platelets: 202 10*3/uL (ref 150–400)
RBC: 3.03 MIL/uL — ABNORMAL LOW (ref 4.22–5.81)
RDW: 14.4 % (ref 11.5–15.5)
WBC: 8.4 10*3/uL (ref 4.0–10.5)
nRBC: 0 % (ref 0.0–0.2)

## 2022-02-19 LAB — ECHOCARDIOGRAM COMPLETE
AR max vel: 2.81 cm2
AV Peak grad: 8.8 mmHg
Ao pk vel: 1.49 m/s
Area-P 1/2: 4.1 cm2
Height: 70 in
S' Lateral: 3 cm
Weight: 2982.4 oz

## 2022-02-19 LAB — GLUCOSE, CAPILLARY
Glucose-Capillary: 320 mg/dL — ABNORMAL HIGH (ref 70–99)
Glucose-Capillary: 286 mg/dL — ABNORMAL HIGH (ref 70–99)
Glucose-Capillary: 246 mg/dL — ABNORMAL HIGH (ref 70–99)
Glucose-Capillary: 220 mg/dL — ABNORMAL HIGH (ref 70–99)

## 2022-02-19 LAB — PROCALCITONIN: Procalcitonin: 0.48 ng/mL

## 2022-02-19 LAB — STREP PNEUMONIAE URINARY ANTIGEN: Strep Pneumo Urinary Antigen: NEGATIVE

## 2022-02-19 MED ORDER — INSULIN ASPART 100 UNIT/ML IJ SOLN
0.0000 [IU] | Freq: Every day | INTRAMUSCULAR | Status: DC
  Administered 2022-02-19: 2 [IU] via SUBCUTANEOUS
  Administered 2022-02-19: 1 [IU] via SUBCUTANEOUS

## 2022-02-19 MED ORDER — SODIUM ZIRCONIUM CYCLOSILICATE 10 G PO PACK: 10.0000 g | PACK | Freq: Once | ORAL | Status: DC

## 2022-02-19 MED ORDER — LABETALOL HCL 200 MG PO TABS
100.0000 mg | ORAL_TABLET | Freq: Two times a day (BID) | ORAL | Status: DC
Start: 2022-02-19 — End: 2022-02-21
  Administered 2022-02-19 – 2022-02-21 (×4): 100 mg via ORAL
  Filled 2022-02-19 (×4): qty 1

## 2022-02-19 MED ORDER — SODIUM ZIRCONIUM CYCLOSILICATE 10 G PO PACK
10.0000 g | PACK | Freq: Two times a day (BID) | ORAL | Status: DC
  Administered 2022-02-19: 10 g via ORAL
  Filled 2022-02-19: qty 1

## 2022-02-19 NOTE — Progress Notes (Signed)
Pt BUN 60 Cr 6.1 K 5.4; pt has voided 100 cc.  Pt reports breathing improved, sat 99% on RA, cough absent at present.  Lungs with coarse BS bil, bilateral pitting edema present.   Paged MD with am lab results and urinary output.

## 2022-02-19 NOTE — Care Management Obs Status (Signed)
[facility] NOTIFICATION   Patient Details  Name: Louis Peters MRN: [id number] Date of Birth: 1966/02/24   Medicare Observation Status Notification Given:  Yes    Amire Leazer G., RN 02/19/2022, 12:00 PM
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
## 2022-02-19 NOTE — Progress Notes (Signed)
PROGRESS NOTE    Louis Peters  OZY:[id number] DOB: 19-Aug-1966 DOA: 02/18/2022 PCP: Bernerd Limbo, MD   Brief Narrative:  Louis Peters is a pleasant 56 y.o. 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 More acutely noted near syncope/dizziness x3 which provoked his trip to the ED. In ED imaging concerning for bibasilar pneumonia/effusions. Admitted for further evaluation and treatment.  Assessment & Plan:   Principal Problem:   Near syncope Active Problems:   Type 1 diabetes mellitus with stage 5 chronic kidney disease and hypertension (HCC)   CKD (chronic kidney disease), stage V (HCC)   Hypertension   Community acquired pneumonia   Elevated troponin   Community acquired pneumonia Vs possible bilateral effusions from central process - rule out heart failure - Does NOT meet sepsis criteria, is not hypoxic - Bibasilar infiltrate on imaging  - Continue azithromycin/ceftriaxone x 5 days - Cultures were not be collected prior to starting antibiotics  - Procalcitonin reassuringly low - Echo pending   Near-syncope  Elevated troponin, rule out NSTEMI - Unclear if near-syncope is due to hypotension/hypoxia which could explain elevated troponin due to supply/demand mismatch - Orthostatics pending - Telemetry ongoing to rule out dysrhythmia - Echo pending  CKD V Concurrent mild hyperkalemia  - At baseline, creatinine around 5   - He has AVF in right arm but not on dialysis  - Potassium elevation treated with lokelma - follow repeat labs    Hypertension  - Controlled  - Continue Norvasc and labetalol    Type I DM, uncontrolled with hyperglycemia - A1c was 8.1% in May 2023  - Continue sliding scale insulin/hypoglycemic  protocol     DVT prophylaxis: Heparin Code Status: Full Family Communication: Wife at bedside  Status is: Inpatient  Dispo: The patient is from: Home              Anticipated d/c is to: Home              Anticipated d/c date is: 24 to 48 hours              Patient currently not medically stable for discharge  Consultants:  Cardiology  Procedures:  None  Antimicrobials:  Azithromycin/ceftriaxone x5 days  Subjective: No acute issues or events overnight tolerating echo currently denies chest pain nausea vomiting diarrhea constipation headache fevers chills shortness of breath.  Objective: Vitals:   02/18/22 2130 02/18/22 2200 02/18/22 2237 02/19/22 0541  BP: 113/74 111/60 128/66 (!) 167/84  Pulse: 87 85 85 83  Resp: 17 (!) 21 20 18   Temp:   99 F (37.2 C) 98.5 F (36.9 C)  TempSrc:   Oral Oral  SpO2: 97% 97% 97% 99%  Weight:   85.2 kg 84.6 kg  Height:   5\' 10"  (1.778 m)     Intake/Output Summary (Last 24 hours) at 02/19/2022 0740 Last data filed at 02/19/2022 0500 Gross per 24 hour  Intake 243 ml  Output 100 ml  Net 143 ml   Filed Weights   02/18/22 2237 02/19/22 0541  Weight: 85.2 kg 84.6 kg    Examination:  General:  Pleasantly resting in bed, No acute distress. HEENT:  Normocephalic atraumatic.  Sclerae nonicteric, noninjected.  Extraocular movements intact bilaterally. Neck:  Without mass or deformity.  Trachea is midline. Lungs:  Clear to auscultate bilaterally without rhonchi, wheeze, or rales. Heart:  Regular rate and rhythm.  Without murmurs, rubs, or gallops. Abdomen:  Soft, nontender, nondistended.  Without guarding or rebound. Extremities: Without cyanosis, clubbing, edema, or obvious deformity. Vascular:  Dorsalis pedis and posterior tibial pulses palpable bilaterally. Skin:  Warm and dry, no erythema, no ulcerations.   Data Reviewed: I have personally reviewed following labs and imaging studies  CBC: Recent Labs  Lab 02/18/22 1800  02/19/22 0250  WBC 9.5 8.4  NEUTROABS 8.1*  --   HGB 9.9* 8.8*  HCT 31.6* 28.1*  MCV 94.0 92.7  PLT 211 536   Basic Metabolic Panel: Recent Labs  Lab 02/18/22 1800 02/19/22 0250  NA 141 139  K 5.3* 5.4*  CL 111 111  CO2 21* 20*  GLUCOSE 256* 300*  BUN 55* 60*  CREATININE 5.24* 6.10*  CALCIUM 9.0 8.8*   GFR: Estimated Creatinine Clearance: 13.8 mL/min (A) (by C-G formula based on SCr of 6.1 mg/dL (H)). Liver Function Tests: Recent Labs  Lab 02/18/22 1800  AST 33  ALT 28  ALKPHOS 81  BILITOT 1.2  PROT 6.5  ALBUMIN 3.4*   No results for input(s): LIPASE, AMYLASE in the last 168 hours. Recent Labs  Lab 02/18/22 2015  AMMONIA 13   Coagulation Profile: No results for input(s): INR, PROTIME in the last 168 hours. Cardiac Enzymes: Recent Labs  Lab 02/18/22 1924  CKTOTAL 335   BNP (last 3 results) No results for input(s): PROBNP in the last 8760 hours. HbA1C: No results for input(s): HGBA1C in the last 72 hours. CBG: Recent Labs  Lab 02/18/22 1724 02/18/22 2004 02/18/22 2248  GLUCAP 203* 200* 265*   Lipid Profile: No results for input(s): CHOL, HDL, LDLCALC, TRIG, CHOLHDL, LDLDIRECT in the last 72 hours. Thyroid Function Tests: No results for input(s): TSH, T4TOTAL, FREET4, T3FREE, THYROIDAB in the last 72 hours. Anemia Panel: No results for input(s): VITAMINB12, FOLATE, FERRITIN, TIBC, IRON, RETICCTPCT in the last 72 hours. Sepsis Labs: Recent Labs  Lab 02/19/22 0250  PROCALCITON 0.48    Recent Results (from the past 240 hour(s))  Resp Panel by RT-PCR (Flu A&B, Covid) Nasopharyngeal Swab     Status: None   Collection Time: 02/18/22  6:02 PM   Specimen: Nasopharyngeal Swab; Nasopharyngeal(NP) swabs in vial transport medium  Result Value Ref Range Status   SARS Coronavirus 2 by RT PCR NEGATIVE NEGATIVE Final    Comment: (NOTE) SARS-CoV-2 target nucleic acids are NOT DETECTED.  The SARS-CoV-2 RNA is generally detectable in upper  respiratory specimens during the acute phase of infection. The lowest concentration of SARS-CoV-2 viral copies this assay can detect is 138 copies/mL. A negative result does not preclude SARS-Cov-2 infection and should not be used as the sole basis for treatment or other patient management decisions. A negative result may occur with  improper specimen collection/handling, submission of specimen other than nasopharyngeal swab, presence of viral mutation(s) within the areas targeted by this assay, and inadequate number of viral copies(<138 copies/mL). A negative result must be combined with clinical observations, patient history, and epidemiological information. The expected result is Negative.  Fact Sheet for Patients:  EntrepreneurPulse.com.au  Fact Sheet for Healthcare Providers:  IncredibleEmployment.be  This test is no t yet approved or cleared by the [place] FDA and  has been authorized for detection and/or diagnosis of SARS-CoV-2 by FDA under an Emergency Use Authorization (EUA). This EUA will remain  in effect (meaning  this test can be used) for the duration of the COVID-19 declaration under Section 564(b)(1) of the Act, 21 U.S.C.section 360bbb-3(b)(1), unless the authorization is terminated  or revoked sooner.       Influenza A by PCR NEGATIVE NEGATIVE Final   Influenza B by PCR NEGATIVE NEGATIVE Final    Comment: (NOTE) The Xpert Xpress SARS-CoV-2/FLU/RSV plus assay is intended as an aid in the diagnosis of influenza from Nasopharyngeal swab specimens and should not be used as a sole basis for treatment. Nasal washings and aspirates are unacceptable for Xpert Xpress SARS-CoV-2/FLU/RSV testing.  Fact Sheet for Patients: EntrepreneurPulse.com.au  Fact Sheet for Healthcare Providers: IncredibleEmployment.be  This test is not yet approved or cleared by the [place] FDA and has been  authorized for detection and/or diagnosis of SARS-CoV-2 by FDA under an Emergency Use Authorization (EUA). This EUA will remain in effect (meaning this test can be used) for the duration of the COVID-19 declaration under Section 564(b)(1) of the Act, 21 U.S.C. section 360bbb-3(b)(1), unless the authorization is terminated or revoked.  Performed at [facility], [place] [address] [place], [place] [address]          Radiology Studies: CT HEAD WO CONTRAST (5MM)  Result Date: 02/18/2022 CLINICAL DATA:  Altered mental status. EXAM: CT HEAD WITHOUT CONTRAST TECHNIQUE: Contiguous axial images were obtained from the base of the skull through the vertex without intravenous contrast. RADIATION DOSE REDUCTION: This exam was performed according to the departmental dose-optimization program which includes automated exposure control, adjustment of the mA and/or kV according to patient size and/or use of iterative reconstruction technique. COMPARISON:  03/20/2021 FINDINGS: Brain: No evidence of intracranial hemorrhage, acute infarction, hydrocephalus, extra-axial collection, or mass lesion/mass effect. Stable mild generalized cerebral atrophy and chronic small vessel disease. Vascular:  No hyperdense vessel or other acute findings. Skull: No evidence of fracture or other significant bone abnormality. Sinuses/Orbits: No acute findings. Chronic opacification of the left frontal and bilateral ethmoid sinuses is again seen. Mucosal thickening noted in the right maxillary sinus. Other: None. IMPRESSION: No acute intracranial abnormality. Stable mild cerebral atrophy and chronic small vessel disease. Chronic sinusitis. Electronically Signed   By: Marlaine Hind M.D.   On: 02/18/2022 17:41   CT CHEST WO CONTRAST  Result Date: 02/18/2022 CLINICAL DATA:  Dyspnea, chronic, unclear etiology SOB, possible pericardial effusion, patient has Cr of 5 EXAM: CT CHEST WITHOUT CONTRAST TECHNIQUE: Multidetector CT  imaging of the chest was performed following the standard protocol without IV contrast. RADIATION DOSE REDUCTION: This exam was performed according to the departmental dose-optimization program which includes automated exposure control, adjustment of the mA and/or kV according to patient size and/or use of iterative reconstruction technique. COMPARISON:  Same day chest x-ray, abdominal CT 04/12/2021 FINDINGS: Cardiovascular: Heart is mildly enlarged. Small pericardial effusion, stable in size from prior CT. Thoracic aorta is nonaneurysmal. Scattered atherosclerotic calcifications of the aorta and coronary arteries. Pulmonary trunk is upper limits of normal in size. Mediastinum/Nodes: No axillary or mediastinal lymphadenopathy. Evaluation of the hilar structures is limited in the absence of intravenous contrast. Within this limitation, no obvious hilar adenopathy or mass is identified. Thyroid, trachea, and esophagus demonstrate no significant findings. Lungs/Pleura: Small layering bilateral pleural effusions with associated bibasilar airspace consolidation. Remaining lung fields are otherwise clear. No pneumothorax. Upper Abdomen: No acute abnormality. Musculoskeletal: Diffuse anasarca. Chronic superior endplate compression fracture of T12. No new or acute osseous findings. IMPRESSION: 1. Small layering bilateral pleural effusions with associated bibasilar airspace  consolidation, which may represent atelectasis or pneumonia. 2. Mild cardiomegaly with small pericardial effusion, stable in size from prior CT. 3. Diffuse anasarca. 4. Aortic and coronary artery atherosclerosis (ICD10-I70.0). Electronically Signed   By: Davina Poke D.O.   On: 02/18/2022 20:41   DG Chest Port 1 View  Result Date: 02/18/2022 CLINICAL DATA:  Cough with shortness of breath. EXAM: PORTABLE CHEST 1 VIEW COMPARISON:  Chest x-ray 10/01/2021 FINDINGS: The cardiac silhouette appears mildly enlarged, a new finding. This may be partially  projectional secondary to lordotic positioning. There is no lung consolidation, pleural effusion or pneumothorax visualized. Osseous structures are within normal limits. IMPRESSION: 1. Mild enlargement of the cardiac silhouette, new from prior. This may be projectional, but new cardiomegaly or pericardial effusion can not be excluded. Consider further evaluation with upright PA and lateral chest x-ray. Electronically Signed   By: Ronney Asters M.D.   On: 02/18/2022 18:42        Scheduled Meds:  amLODipine  10 mg Oral q morning   aspirin EC  81 mg Oral q morning   atorvastatin  80 mg Oral q morning   azithromycin  500 mg Oral Daily   clopidogrel  75 mg Oral q morning   heparin  5,000 Units Subcutaneous Q8H   insulin aspart  0-4 Units Subcutaneous QHS   insulin aspart  0-6 Units Subcutaneous TID WC   insulin aspart  3 Units Subcutaneous TID WC   insulin glargine-yfgn  6 Units Subcutaneous QHS   labetalol  100 mg Oral BID WC   sodium chloride flush  3 mL Intravenous Q12H   sodium chloride flush  3 mL Intravenous Q12H   Continuous Infusions:  sodium chloride     cefTRIAXone (ROCEPHIN)  IV       LOS: 0 days   Time spent: 29min  Little Ishikawa, DO [facility]  If 7PM-7AM, please contact night-coverage [email or web address]  02/19/2022, 7:40 AM

## 2022-02-19 NOTE — Progress Notes (Signed)
K+ 5.4, 10g Lokelma ordered BID x 2 doses.

## 2022-02-20 DIAGNOSIS — R55 Syncope and collapse: Secondary | ICD-10-CM | POA: Diagnosis not present

## 2022-02-20 LAB — LEGIONELLA PNEUMOPHILA SEROGP 1 UR AG
L. pneumophila Serogp 1 Ur Ag: NEGATIVE
L. pneumophila Serogp 1 Ur Ag: NEGATIVE

## 2022-02-20 LAB — STREP PNEUMONIAE URINARY ANTIGEN: Strep Pneumo Urinary Antigen: NEGATIVE

## 2022-02-20 LAB — GLUCOSE, CAPILLARY
Glucose-Capillary: 566 mg/dL (ref 70–99)
Glucose-Capillary: 451 mg/dL — ABNORMAL HIGH (ref 70–99)
Glucose-Capillary: 332 mg/dL — ABNORMAL HIGH (ref 70–99)
Glucose-Capillary: 73 mg/dL (ref 70–99)

## 2022-02-20 LAB — CBC
HCT: 27.9 % — ABNORMAL LOW (ref 39.0–52.0)
Hemoglobin: 8.9 g/dL — ABNORMAL LOW (ref 13.0–17.0)
MCH: 29.1 pg (ref 26.0–34.0)
MCHC: 31.9 g/dL (ref 30.0–36.0)
MCV: 91.2 fL (ref 80.0–100.0)
Platelets: 220 10*3/uL (ref 150–400)
RBC: 3.06 MIL/uL — ABNORMAL LOW (ref 4.22–5.81)
RDW: 14.1 % (ref 11.5–15.5)
WBC: 7.6 10*3/uL (ref 4.0–10.5)
nRBC: 0 % (ref 0.0–0.2)

## 2022-02-20 LAB — BASIC METABOLIC PANEL
Anion gap: 10 (ref 5–15)
BUN: 63 mg/dL — ABNORMAL HIGH (ref 6–20)
CO2: 19 mmol/L — ABNORMAL LOW (ref 22–32)
Calcium: 8.7 mg/dL — ABNORMAL LOW (ref 8.9–10.3)
Chloride: 109 mmol/L (ref 98–111)
Creatinine, Ser: 6.3 mg/dL — ABNORMAL HIGH (ref 0.61–1.24)
GFR, Estimated: 10 mL/min — ABNORMAL LOW (ref >60)
Glucose, Bld: 449 mg/dL — ABNORMAL HIGH (ref 70–99)
Potassium: 5.3 mmol/L — ABNORMAL HIGH (ref 3.5–5.1)
Sodium: 138 mmol/L (ref 135–145)

## 2022-02-20 LAB — PROCALCITONIN: Procalcitonin: 0.47 ng/mL

## 2022-02-20 MED ORDER — INSULIN ASPART 100 UNIT/ML IJ SOLN
0.0000 [IU] | Freq: Three times a day (TID) | INTRAMUSCULAR | Status: DC
  Administered 2022-02-20: 11 [IU] via SUBCUTANEOUS
  Administered 2022-02-21 (×2): 2 [IU] via SUBCUTANEOUS

## 2022-02-20 MED ORDER — INSULIN GLARGINE-YFGN 100 UNIT/ML ~~LOC~~ SOLN
15.0000 [IU] | Freq: Every day | SUBCUTANEOUS | Status: DC
  Administered 2022-02-20: 15 [IU] via SUBCUTANEOUS
  Filled 2022-02-20 (×2): qty 0.15

## 2022-02-20 MED ORDER — INSULIN ASPART 100 UNIT/ML IJ SOLN
5.0000 [IU] | Freq: Once | INTRAMUSCULAR | Status: AC
  Administered 2022-02-20: 5 [IU] via SUBCUTANEOUS

## 2022-02-20 NOTE — Progress Notes (Addendum)
Progress Note  Patient Name: Louis Peters Date of Encounter: 02/20/2022  [facility] Cardiologist: New (Dr. Harriet Masson)  Subjective   No acute overnight events. Patient denies any additional near syncopal episodes. He has been up walking with no lightheadedness/dizziness. No chest pain or shortness of breath. No concerning tachy or brady arrhythmias noted on telemetry.  Inpatient Medications    Scheduled Meds:  amLODipine  10 mg Oral q morning   aspirin EC  81 mg Oral q morning   atorvastatin  80 mg Oral q morning   azithromycin  500 mg Oral Daily   clopidogrel  75 mg Oral q morning   heparin  5,000 Units Subcutaneous Q8H   insulin aspart  0-4 Units Subcutaneous QHS   insulin aspart  0-6 Units Subcutaneous TID WC   insulin aspart  3 Units Subcutaneous TID WC   insulin glargine-yfgn  6 Units Subcutaneous QHS   labetalol  100 mg Oral BID   sodium chloride flush  3 mL Intravenous Q12H   sodium chloride flush  3 mL Intravenous Q12H   Continuous Infusions:  sodium chloride     cefTRIAXone (ROCEPHIN)  IV 2 g (02/19/22 2200)   PRN Meds: sodium chloride, acetaminophen **OR** acetaminophen, senna-docusate, sodium chloride flush   Vital Signs    Vitals:   02/19/22 1624 02/19/22 2024 02/20/22 0508 02/20/22 0645  BP: (!) 143/73 (!) 143/77 (!) 169/78 (!) 166/80  Pulse: 75 79 82   Resp: 16 16 17    Temp: 97.9 F (36.6 C) 98.3 F (36.8 C) 98 F (36.7 C)   TempSrc: Oral Oral Oral   SpO2: 97% 96% 90%   Weight:   83.1 kg   Height:        Intake/Output Summary (Last 24 hours) at 02/20/2022 0707 Last data filed at 02/19/2022 2200 Gross per 24 hour  Intake 1060 ml  Output 250 ml  Net 810 ml      02/20/2022    5:08 AM 02/19/2022    5:41 AM 02/18/2022   10:37 PM  Last 3 Weights  Weight (lbs) 183 lb 3.2 oz 186 lb 6.4 oz 187 lb 14.4 oz  Weight (kg) 83.099 kg 84.55 kg 85.231 kg      Telemetry    Normal sinus rhythm with rates in the 70s to 80s. - Personally Reviewed  ECG     No new ECG tracing today. - Personally Reviewed  Physical Exam   GEN: No acute distress.   Neck: No JVD. Cardiac: RRR. No murmurs, rubs, or gallops.  Respiratory: No increased work of breathing. Diffuse rhonchi but no significant wheezes or crackles. GI: Soft, non-distended, and non-tender. MS: 1+ pitting edema of bilateral lower extremities. No deformity. Skin: Warm and dry. Neuro:  No focal deficits. Psych: Normal affect. Responds appropriately.  Labs    High Sensitivity Troponin:   Recent Labs  Lab 02/18/22 1800 02/18/22 1924  TROPONINIHS 200* 134*     Chemistry Recent Labs  Lab 02/18/22 1800 02/19/22 0250  NA 141 139  K 5.3* 5.4*  CL 111 111  CO2 21* 20*  GLUCOSE 256* 300*  BUN 55* 60*  CREATININE 5.24* 6.10*  CALCIUM 9.0 8.8*  PROT 6.5  --   ALBUMIN 3.4*  --   AST 33  --   ALT 28  --   ALKPHOS 81  --   BILITOT 1.2  --   GFRNONAA 12* 10*  ANIONGAP 9 8    Lipids No results for input(s):  CHOL, TRIG, HDL, LABVLDL, LDLCALC, CHOLHDL in the last 168 hours.  Hematology Recent Labs  Lab 02/18/22 1800 02/19/22 0250  WBC 9.5 8.4  RBC 3.36* 3.03*  HGB 9.9* 8.8*  HCT 31.6* 28.1*  MCV 94.0 92.7  MCH 29.5 29.0  MCHC 31.3 31.3  RDW 14.3 14.4  PLT 211 202   Thyroid No results for input(s): TSH, FREET4 in the last 168 hours.  BNP Recent Labs  Lab 02/18/22 2015  BNP 1,495.2*    DDimer No results for input(s): DDIMER in the last 168 hours.   Radiology    CT HEAD WO CONTRAST (5MM)  Result Date: 02/18/2022 CLINICAL DATA:  Altered mental status. EXAM: CT HEAD WITHOUT CONTRAST TECHNIQUE: Contiguous axial images were obtained from the base of the skull through the vertex without intravenous contrast. RADIATION DOSE REDUCTION: This exam was performed according to the departmental dose-optimization program which includes automated exposure control, adjustment of the mA and/or kV according to patient size and/or use of iterative reconstruction technique.  COMPARISON:  03/20/2021 FINDINGS: Brain: No evidence of intracranial hemorrhage, acute infarction, hydrocephalus, extra-axial collection, or mass lesion/mass effect. Stable mild generalized cerebral atrophy and chronic small vessel disease. Vascular:  No hyperdense vessel or other acute findings. Skull: No evidence of fracture or other significant bone abnormality. Sinuses/Orbits: No acute findings. Chronic opacification of the left frontal and bilateral ethmoid sinuses is again seen. Mucosal thickening noted in the right maxillary sinus. Other: None. IMPRESSION: No acute intracranial abnormality. Stable mild cerebral atrophy and chronic small vessel disease. Chronic sinusitis. Electronically Signed   By: Marlaine Hind M.D.   On: 02/18/2022 17:41   CT CHEST WO CONTRAST  Result Date: 02/18/2022 CLINICAL DATA:  Dyspnea, chronic, unclear etiology SOB, possible pericardial effusion, patient has Cr of 5 EXAM: CT CHEST WITHOUT CONTRAST TECHNIQUE: Multidetector CT imaging of the chest was performed following the standard protocol without IV contrast. RADIATION DOSE REDUCTION: This exam was performed according to the departmental dose-optimization program which includes automated exposure control, adjustment of the mA and/or kV according to patient size and/or use of iterative reconstruction technique. COMPARISON:  Same day chest x-ray, abdominal CT 04/12/2021 FINDINGS: Cardiovascular: Heart is mildly enlarged. Small pericardial effusion, stable in size from prior CT. Thoracic aorta is nonaneurysmal. Scattered atherosclerotic calcifications of the aorta and coronary arteries. Pulmonary trunk is upper limits of normal in size. Mediastinum/Nodes: No axillary or mediastinal lymphadenopathy. Evaluation of the hilar structures is limited in the absence of intravenous contrast. Within this limitation, no obvious hilar adenopathy or mass is identified. Thyroid, trachea, and esophagus demonstrate no significant findings.  Lungs/Pleura: Small layering bilateral pleural effusions with associated bibasilar airspace consolidation. Remaining lung fields are otherwise clear. No pneumothorax. Upper Abdomen: No acute abnormality. Musculoskeletal: Diffuse anasarca. Chronic superior endplate compression fracture of T12. No new or acute osseous findings. IMPRESSION: 1. Small layering bilateral pleural effusions with associated bibasilar airspace consolidation, which may represent atelectasis or pneumonia. 2. Mild cardiomegaly with small pericardial effusion, stable in size from prior CT. 3. Diffuse anasarca. 4. Aortic and coronary artery atherosclerosis (ICD10-I70.0). Electronically Signed   By: Davina Poke D.O.   On: 02/18/2022 20:41   DG Chest Port 1 View  Result Date: 02/18/2022 CLINICAL DATA:  Cough with shortness of breath. EXAM: PORTABLE CHEST 1 VIEW COMPARISON:  Chest x-ray 10/01/2021 FINDINGS: The cardiac silhouette appears mildly enlarged, a new finding. This may be partially projectional secondary to lordotic positioning. There is no lung consolidation, pleural effusion or pneumothorax visualized. Osseous  structures are within normal limits. IMPRESSION: 1. Mild enlargement of the cardiac silhouette, new from prior. This may be projectional, but new cardiomegaly or pericardial effusion can not be excluded. Consider further evaluation with upright PA and lateral chest x-ray. Electronically Signed   By: Ronney Asters M.D.   On: 02/18/2022 18:42   ECHOCARDIOGRAM COMPLETE  Result Date: 02/19/2022    ECHOCARDIOGRAM REPORT   Patient Name:   Louis Peters Date of Exam: 02/19/2022 Medical Rec #:  [id number]       Height:       70.0 in Accession #:    [id number]      Weight:       186.4 lb Date of Birth:  31-May-1966      BSA:          2.026 m Patient Age:    2 years        BP:           152/90 mmHg Patient Gender: M               HR:           76 bpm. Exam Location:  Inpatient Procedure: 2D Echo, Cardiac Doppler and Color Doppler  Indications:    Elevated troponin  History:        Patient has prior history of Echocardiogram examinations, most                 recent 07/22/2020. Risk Factors:Hypertension and Diabetes.  Sonographer:    Jefferey Pica Referring Phys: [id number] [place]  1. Left ventricular ejection fraction, by estimation, is 60 to 65%. The left ventricle has normal function. The left ventricle has no regional wall motion abnormalities. There is moderate concentric left ventricular hypertrophy. Left ventricular diastolic parameters are consistent with Grade I diastolic dysfunction (impaired relaxation).  2. Right ventricular systolic function is low normal. The right ventricular size is mildly enlarged. There is moderately elevated pulmonary artery systolic pressure.  3. Left atrial size was moderately dilated.  4. Right atrial size was moderately dilated.  5. A small pericardial effusion is present. The pericardial effusion is circumferential.  6. The mitral valve is normal in structure. No evidence of mitral valve regurgitation.  7. The aortic valve is tricuspid. Aortic valve regurgitation is not visualized. No aortic stenosis is present.  8. The inferior vena cava is dilated in size with >50% respiratory variability, suggesting right atrial pressure of 8 mmHg. FINDINGS  Left Ventricle: Left ventricular ejection fraction, by estimation, is 60 to 65%. The left ventricle has normal function. The left ventricle has no regional wall motion abnormalities. The left ventricular internal cavity size was normal in size. There is  moderate concentric left ventricular hypertrophy. Left ventricular diastolic parameters are consistent with Grade I diastolic dysfunction (impaired relaxation). Normal left ventricular filling pressure. Right Ventricle: The right ventricular size is mildly enlarged. No increase in right ventricular wall thickness. Right ventricular systolic function is low normal. There is moderately elevated  pulmonary artery systolic pressure. The tricuspid regurgitant  velocity is 3.05 m/s, and with an assumed right atrial pressure of 8 mmHg, the estimated right ventricular systolic pressure is 22.0 mmHg. Left Atrium: Left atrial size was moderately dilated. Right Atrium: Right atrial size was moderately dilated. Pericardium: A small pericardial effusion is present. The pericardial effusion is circumferential. Mitral Valve: The mitral valve is normal in structure. No evidence of mitral valve regurgitation. Tricuspid Valve: The tricuspid valve is normal in  structure. Tricuspid valve regurgitation is mild. Aortic Valve: The aortic valve is tricuspid. Aortic valve regurgitation is not visualized. No aortic stenosis is present. Aortic valve peak gradient measures 8.8 mmHg. Pulmonic Valve: The pulmonic valve was normal in structure. Pulmonic valve regurgitation is trivial. Aorta: The aortic root and ascending aorta are structurally normal, with no evidence of dilitation. Venous: The inferior vena cava is dilated in size with greater than 50% respiratory variability, suggesting right atrial pressure of 8 mmHg. IAS/Shunts: No atrial level shunt detected by color flow Doppler. Additional Comments: There is pleural effusion in the left lateral region.  LEFT VENTRICLE PLAX 2D LVIDd:         4.50 cm   Diastology LVIDs:         3.00 cm   LV e' medial:    4.75 cm/s LV PW:         1.50 cm   LV E/e' medial:  12.5 LV IVS:        1.60 cm   LV e' lateral:   11.10 cm/s LVOT diam:     2.00 cm   LV E/e' lateral: 5.3 LV SV:         80 LV SV Index:   40 LVOT Area:     3.14 cm  RIGHT VENTRICLE             IVC RV Basal diam:  3.30 cm     IVC diam: 2.20 cm RV Mid diam:    3.80 cm RV S prime:     11.40 cm/s TAPSE (M-mode): 1.8 cm LEFT ATRIUM             Index        RIGHT ATRIUM           Index LA diam:        4.10 cm 2.02 cm/m   RA Area:     23.60 cm LA Vol (A2C):   87.4 ml 43.14 ml/m  RA Volume:   82.10 ml  40.53 ml/m LA Vol (A4C):   82.8  ml 40.87 ml/m LA Biplane Vol: 85.5 ml 42.21 ml/m  AORTIC VALVE                 PULMONIC VALVE AV Area (Vmax): 2.81 cm     PV Vmax:       0.74 m/s AV Vmax:        148.50 cm/s  PV Peak grad:  2.2 mmHg AV Peak Grad:   8.8 mmHg LVOT Vmax:      133.00 cm/s LVOT Vmean:     83.000 cm/s LVOT VTI:       0.256 m  AORTA Ao Root diam: 3.70 cm Ao Asc diam:  3.40 cm MITRAL VALVE               TRICUSPID VALVE MV Area (PHT): 4.10 cm    TR Peak grad:   37.2 mmHg MV Decel Time: 185 msec    TR Vmax:        305.00 cm/s MV E velocity: 59.20 cm/s MV A velocity: 81.00 cm/s  SHUNTS MV E/A ratio:  0.73        Systemic VTI:  0.26 m                            Systemic Diam: 2.00 cm Dani Gobble Croitoru MD Electronically signed by Sanda Klein MD Signature Date/Time: 02/19/2022/1:06:09 PM    Final  Cardiac Studies   Echocardiogram 02/19/2022: Impressions: 1. Left ventricular ejection fraction, by estimation, is 60 to 65%. The  left ventricle has normal function. The left ventricle has no regional  wall motion abnormalities. There is moderate concentric left ventricular  hypertrophy. Left ventricular  diastolic parameters are consistent with Grade I diastolic dysfunction  (impaired relaxation).   2. Right ventricular systolic function is low normal. The right  ventricular size is mildly enlarged. There is moderately elevated  pulmonary artery systolic pressure.   3. Left atrial size was moderately dilated.   4. Right atrial size was moderately dilated.   5. A small pericardial effusion is present. The pericardial effusion is  circumferential.   6. The mitral valve is normal in structure. No evidence of mitral valve  regurgitation.   7. The aortic valve is tricuspid. Aortic valve regurgitation is not  visualized. No aortic stenosis is present.   8. The inferior vena cava is dilated in size with >50% respiratory  variability, suggesting right atrial pressure of 8 mmHg.   Patient Profile     56 y.o. male with a history  of CVA, hypertension, hyperlipidemia, type 1 diabetes mellitus, and CKD stage V who is being seen for evaluation of near syncope and elevated troponin at the request of Dr. Avon Gully.  Assessment & Plan    Near Syncope Patient had 3 episodes of near syncope prior to admission with associated dizziness, fatigue, viral symptoms, and poor PO intake over the past couple of days. Wife reported LOC but patient denies this. Echo showed LVEF of 60-65% with moderate LVH and grade 1 diastolic dysfunction, mildly enlarged RV with low normal systolic function, and moderately elevated PASP. No significant valvular disease.  - No recurrent near syncope or lightheadedness/dizziness. - No concerning arrhyhmias noted or heart blocker noted on telemetry so far. - Recommend outpatient monitor at discharge.  Elevated Troponin High-sensitivity troponin 200 >> 134. Not consistent with ACS. Non-contrast chest CT showed aortic and coronary artery atherosclerosis. Echo showed normal LV function and no regional wall motion abnormalities. - No chest pain.  - Continue aspirin, beta-blocker, and statin. - Troponin elevation felt to be due to CKD stage V. No additional ischemic work-up necessary at this time.  Possible Acute Diastolic CHF Elevated BNP Bilateral Pleural Effusions BNP elevated at 1,495. Chest x-ray showed mild enlargement of the cardiac silhouette. Chest CT showed small layering bilateral pleural effusion with associated bibasilar airspace consolidation which may represent atelectasis or pneumonia. Also showed diffuse anasarca. Echo showed LVEF of 60-65% with moderate LVH and grade 1 diastolic dysfunction. - He has diffuse rhonchi and mild lower extremity edema on exam. - Wonder whether he would benefit from diuresis. Would defer management to Nephrology given CKD stage V.  Hypertension BP elevated. - Current medications: Amlodipine 10mg  daily and Labetalol 100mg  twice daily. - Consider switch from  Labetalol to Coreg or adding Hydralazine.  Hyperlipidemia - Continue Lipitor 80mg  daily.  Type 1 Diabetes Hemoglobin A1c 8.1 on 02/15/2022. - Management per primary team.  CKD Stage V Creatinine 5.24 on admission and has been slowly increasing.  - Creatinine 6.30 today. - He follows with Nephrology as an outpatient through [facility]. He has a right arm fistula but is not on dialysis yet. Would have low threshold to consult Nephrology.  Hyperkalemia Potassium 5.4 yesterday. Received Lokelma and 5.3 today.  - Will defer management to primary team.  Possible Pneumonia Chest CT showed small layering bilateral pleural effusion with associated bibasilar airspace consolidation which  may represent atelectasis or pneumonia.  - Started on antibiotics on admission. - Management per primary team.  History of CVA - Maintained on DAPT with Aspirin and Plavix. - Continue statin as above.   Patient seen and examined, note reviewed with the signed Advanced Practice Provider. I personally reviewed laboratory data, imaging studies and relevant notes. I independently examined the patient and formulated the important aspects of the plan. I have personally discussed the plan with the patient and/or family. Comments or changes to the note/plan are indicated below.  Will need aggressive blood pressure management. Start Hydralazine 25 mg every 8 hours. May need to transition to Coreg from Labetalol.  NO need for ischemic evaluation at this time.   Berniece Salines DO, MS [facility] Attending Cardiologist [place]  [address] [place], [place] [address] [phone] Website: [email or web address]  For questions or updates, please contact [place] Please consult [email or web address] for contact info under        Signed, Darreld Mclean, PA-C  02/20/2022, 7:07 AM

## 2022-02-20 NOTE — Progress Notes (Signed)
PROGRESS NOTE    Louis Peters  HQP:[id number] DOB: [birth year] DOA: 02/18/2022 PCP: Bernerd Limbo, MD   Brief Narrative:  Louis Peters is a pleasant 56 y.o. 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 More acutely noted near syncope/dizziness x3 which provoked his trip to the ED. In ED imaging concerning for bibasilar pneumonia/effusions. Admitted for further evaluation and treatment.  Assessment & Plan:   Principal Problem:   Near syncope Active Problems:   Type 1 diabetes mellitus with stage 5 chronic kidney disease and hypertension (HCC)   CKD (chronic kidney disease), stage V (HCC)   Hypertension   Community acquired pneumonia   Elevated troponin   Community acquired pneumonia Vs possible bilateral effusions from central process - rule out heart failure - Grade 1 diastolic dysfunction on echocardiogram, unlikely etiology for patient's symptoms - Does NOT meet sepsis criteria, is not hypoxic - Bibasilar infiltrate on imaging; questionable chronicity - Continue azithromycin/ceftriaxone x 5 days - Cultures were unfortunately not be collected prior to starting antibiotics  - Procalcitonin reassuringly low -low threshold to discontinue antibiotics early given patient's lack of symptoms, reassuring imaging and labs.  Near-syncope  Elevated troponin, rule out NSTEMI - Unclear if near-syncope is due to hypotension/hypoxia with coughing episode which could explain elevated troponin due to supply/demand mismatch - Orthostatics repeated today, unremarkable. - Telemetry ongoing to rule out dysrhythmia - Echo shows grade 1 diastolic dysfunction  CKD V Concurrent mild hyperkalemia  - At baseline, creatinine around 5   - He has AVF  in right arm but not on dialysis  - Potassium elevation treated with lokelma - follow repeat labs    Hypertension  - Currently uncontrolled - cardiology continues to advance medication as tolerated - increase regimen to amlodipine, labetolol and hydralazine per cardiology  Type I DM, uncontrolled with hyperglycemia - A1c was 8.1% in May 2023  - Increase sliding scale and long-acting insulin(moderate/15u respectively); profoundly hyperglycemic today after breakfast, unclear if patient was " cheating" on our diet here with outside food.  DVT prophylaxis: Heparin Code Status: Full Family Communication: Wife updated over the phone  Status is: Inpatient  Dispo: The patient is from: Home              Anticipated d/c is to: Home              Anticipated d/c date is: 24 to 48 hours              Patient currently not medically stable for discharge  Consultants:  Cardiology  Procedures:  None  Antimicrobials:  Azithromycin/ceftriaxone x5 days  Subjective: No acute issues or events overnight, denies nausea vomiting diarrhea constipation headache fevers chills or chest pain.  Objective: Vitals:   02/19/22 1624 02/19/22 2024 02/20/22 0508 02/20/22 0645  BP: (!) 143/73 (!) 143/77 (!) 169/78 (!) 166/80  Pulse: 75 79 82   Resp: 16 16 17    Temp: 97.9 F (36.6 C) 98.3 F (36.8 C) 98 F (36.7 C)   TempSrc: Oral Oral Oral   SpO2: 97% 96% 90%   Weight:   83.1 kg   Height:        Intake/Output Summary (Last 24 hours) at 02/20/2022 5993 Last data filed at 02/19/2022 2200 Gross per 24 hour  Intake 1060 ml  Output 250 ml  Net 810 ml    Filed Weights   02/18/22 2237 02/19/22 0541 02/20/22 0508  Weight: 85.2 kg 84.6 kg 83.1 kg    Examination:  General:  Pleasantly resting in bed, No acute distress. HEENT:  Normocephalic atraumatic.  Sclerae nonicteric, noninjected.  Extraocular movements intact bilaterally. Neck:  Without mass or deformity.  Trachea is midline. Lungs:  Clear to  auscultate bilaterally without rhonchi, wheeze, or rales. Heart:  Regular rate and rhythm.  Without murmurs, rubs, or gallops. Abdomen:  Soft, nontender, nondistended.  Without guarding or rebound. Extremities: Without cyanosis, clubbing, edema, or obvious deformity. Vascular:  Dorsalis pedis and posterior tibial pulses palpable bilaterally. Skin:  Warm and dry, no erythema, no ulcerations.  Data Reviewed: I have personally reviewed following labs and imaging studies  CBC: Recent Labs  Lab 02/18/22 1800 02/19/22 0250  WBC 9.5 8.4  NEUTROABS 8.1*  --   HGB 9.9* 8.8*  HCT 31.6* 28.1*  MCV 94.0 92.7  PLT 211 329    Basic Metabolic Panel: Recent Labs  Lab 02/18/22 1800 02/19/22 0250  NA 141 139  K 5.3* 5.4*  CL 111 111  CO2 21* 20*  GLUCOSE 256* 300*  BUN 55* 60*  CREATININE 5.24* 6.10*  CALCIUM 9.0 8.8*    GFR: Estimated Creatinine Clearance: 13.8 mL/min (A) (by C-G formula based on SCr of 6.1 mg/dL (H)). Liver Function Tests: Recent Labs  Lab 02/18/22 1800  AST 33  ALT 28  ALKPHOS 81  BILITOT 1.2  PROT 6.5  ALBUMIN 3.4*    No results for input(s): LIPASE, AMYLASE in the last 168 hours. Recent Labs  Lab 02/18/22 2015  AMMONIA 13    Coagulation Profile: No results for input(s): INR, PROTIME in the last 168 hours. Cardiac Enzymes: Recent Labs  Lab 02/18/22 1924  CKTOTAL 335    BNP (last 3 results) No results for input(s): PROBNP in the last 8760 hours. HbA1C: No results for input(s): HGBA1C in the last 72 hours. CBG: Recent Labs  Lab 02/18/22 2248 02/19/22 0854 02/19/22 1136 02/19/22 1624 02/19/22 2020  GLUCAP 265* 320* 286* 246* 220*    Lipid Profile: No results for input(s): CHOL, HDL, LDLCALC, TRIG, CHOLHDL, LDLDIRECT in the last 72 hours. Thyroid Function Tests: No results for input(s): TSH, T4TOTAL, FREET4, T3FREE, THYROIDAB in the last 72 hours. Anemia Panel: No results for input(s): VITAMINB12, FOLATE, FERRITIN, TIBC, IRON,  RETICCTPCT in the last 72 hours. Sepsis Labs: Recent Labs  Lab 02/19/22 0250  PROCALCITON 0.48     Recent Results (from the past 240 hour(s))  Resp Panel by RT-PCR (Flu A&B, Covid) Nasopharyngeal Swab     Status: None   Collection Time: 02/18/22  6:02 PM   Specimen: Nasopharyngeal Swab; Nasopharyngeal(NP) swabs in vial transport medium  Result Value Ref Range Status   SARS Coronavirus 2 by RT PCR NEGATIVE NEGATIVE Final    Comment: (NOTE) SARS-CoV-2 target nucleic acids are NOT DETECTED.  The SARS-CoV-2 RNA is generally detectable in upper respiratory specimens during the acute phase of infection. The lowest concentration of SARS-CoV-2 viral copies this assay can detect is 138 copies/mL. A negative result does not preclude SARS-Cov-2 infection and should not be used as the sole basis for treatment or other patient management decisions. A negative result may occur with  improper specimen collection/handling, submission of specimen other than nasopharyngeal swab, presence of viral mutation(s) within the areas targeted by this assay, and inadequate number of viral  copies(<138 copies/mL). A negative result must be combined with clinical observations, patient history, and epidemiological information. The expected result is Negative.  Fact Sheet for Patients:  EntrepreneurPulse.com.au  Fact Sheet for Healthcare Providers:  IncredibleEmployment.be  This test is no t yet approved or cleared by the [place] FDA and  has been authorized for detection and/or diagnosis of SARS-CoV-2 by FDA under an Emergency Use Authorization (EUA). This EUA will remain  in effect (meaning this test can be used) for the duration of the COVID-19 declaration under Section 564(b)(1) of the Act, 21 U.S.C.section 360bbb-3(b)(1), unless the authorization is terminated  or revoked sooner.       Influenza A by PCR NEGATIVE NEGATIVE Final   Influenza B by PCR  NEGATIVE NEGATIVE Final    Comment: (NOTE) The Xpert Xpress SARS-CoV-2/FLU/RSV plus assay is intended as an aid in the diagnosis of influenza from Nasopharyngeal swab specimens and should not be used as a sole basis for treatment. Nasal washings and aspirates are unacceptable for Xpert Xpress SARS-CoV-2/FLU/RSV testing.  Fact Sheet for Patients: EntrepreneurPulse.com.au  Fact Sheet for Healthcare Providers: IncredibleEmployment.be  This test is not yet approved or cleared by the [place] FDA and has been authorized for detection and/or diagnosis of SARS-CoV-2 by FDA under an Emergency Use Authorization (EUA). This EUA will remain in effect (meaning this test can be used) for the duration of the COVID-19 declaration under Section 564(b)(1) of the Act, 21 U.S.C. section 360bbb-3(b)(1), unless the authorization is terminated or revoked.  Performed at [facility], [place] [address] [place], [place] [address]           Radiology Studies: CT HEAD WO CONTRAST (5MM)  Result Date: 02/18/2022 CLINICAL DATA:  Altered mental status. EXAM: CT HEAD WITHOUT CONTRAST TECHNIQUE: Contiguous axial images were obtained from the base of the skull through the vertex without intravenous contrast. RADIATION DOSE REDUCTION: This exam was performed according to the departmental dose-optimization program which includes automated exposure control, adjustment of the mA and/or kV according to patient size and/or use of iterative reconstruction technique. COMPARISON:  03/20/2021 FINDINGS: Brain: No evidence of intracranial hemorrhage, acute infarction, hydrocephalus, extra-axial collection, or mass lesion/mass effect. Stable mild generalized cerebral atrophy and chronic small vessel disease. Vascular:  No hyperdense vessel or other acute findings. Skull: No evidence of fracture or other significant bone abnormality. Sinuses/Orbits: No acute findings.  Chronic opacification of the left frontal and bilateral ethmoid sinuses is again seen. Mucosal thickening noted in the right maxillary sinus. Other: None. IMPRESSION: No acute intracranial abnormality. Stable mild cerebral atrophy and chronic small vessel disease. Chronic sinusitis. Electronically Signed   By: Marlaine Hind M.D.   On: 02/18/2022 17:41   CT CHEST WO CONTRAST  Result Date: 02/18/2022 CLINICAL DATA:  Dyspnea, chronic, unclear etiology SOB, possible pericardial effusion, patient has Cr of 5 EXAM: CT CHEST WITHOUT CONTRAST TECHNIQUE: Multidetector CT imaging of the chest was performed following the standard protocol without IV contrast. RADIATION DOSE REDUCTION: This exam was performed according to the departmental dose-optimization program which includes automated exposure control, adjustment of the mA and/or kV according to patient size and/or use of iterative reconstruction technique. COMPARISON:  Same day chest x-ray, abdominal CT 04/12/2021 FINDINGS: Cardiovascular: Heart is mildly enlarged. Small pericardial effusion, stable in size from prior CT. Thoracic aorta is nonaneurysmal. Scattered atherosclerotic calcifications of the aorta and coronary arteries. Pulmonary trunk is upper limits of normal in size. Mediastinum/Nodes: No axillary or mediastinal lymphadenopathy. Evaluation of the hilar  structures is limited in the absence of intravenous contrast. Within this limitation, no obvious hilar adenopathy or mass is identified. Thyroid, trachea, and esophagus demonstrate no significant findings. Lungs/Pleura: Small layering bilateral pleural effusions with associated bibasilar airspace consolidation. Remaining lung fields are otherwise clear. No pneumothorax. Upper Abdomen: No acute abnormality. Musculoskeletal: Diffuse anasarca. Chronic superior endplate compression fracture of T12. No new or acute osseous findings. IMPRESSION: 1. Small layering bilateral pleural effusions with associated  bibasilar airspace consolidation, which may represent atelectasis or pneumonia. 2. Mild cardiomegaly with small pericardial effusion, stable in size from prior CT. 3. Diffuse anasarca. 4. Aortic and coronary artery atherosclerosis (ICD10-I70.0). Electronically Signed   By: Davina Poke D.O.   On: 02/18/2022 20:41   DG Chest Port 1 View  Result Date: 02/18/2022 CLINICAL DATA:  Cough with shortness of breath. EXAM: PORTABLE CHEST 1 VIEW COMPARISON:  Chest x-ray 10/01/2021 FINDINGS: The cardiac silhouette appears mildly enlarged, a new finding. This may be partially projectional secondary to lordotic positioning. There is no lung consolidation, pleural effusion or pneumothorax visualized. Osseous structures are within normal limits. IMPRESSION: 1. Mild enlargement of the cardiac silhouette, new from prior. This may be projectional, but new cardiomegaly or pericardial effusion can not be excluded. Consider further evaluation with upright PA and lateral chest x-ray. Electronically Signed   By: Ronney Asters M.D.   On: 02/18/2022 18:42   ECHOCARDIOGRAM COMPLETE  Result Date: 02/19/2022    ECHOCARDIOGRAM REPORT   Patient Name:   TYJUAN DEMETRO Date of Exam: 02/19/2022 Medical Rec #:  [id number]       Height:       70.0 in Accession #:    [id number]      Weight:       186.4 lb Date of Birth:  08-Mar-1966      BSA:          2.026 m Patient Age:    82 years        BP:           152/90 mmHg Patient Gender: M               HR:           76 bpm. Exam Location:  Inpatient Procedure: 2D Echo, Cardiac Doppler and Color Doppler Indications:    Elevated troponin  History:        Patient has prior history of Echocardiogram examinations, most                 recent 07/22/2020. Risk Factors:Hypertension and Diabetes.  Sonographer:    Jefferey Pica Referring Phys: [id number] [place]  1. Left ventricular ejection fraction, by estimation, is 60 to 65%. The left ventricle has normal function. The left ventricle  has no regional wall motion abnormalities. There is moderate concentric left ventricular hypertrophy. Left ventricular diastolic parameters are consistent with Grade I diastolic dysfunction (impaired relaxation).  2. Right ventricular systolic function is low normal. The right ventricular size is mildly enlarged. There is moderately elevated pulmonary artery systolic pressure.  3. Left atrial size was moderately dilated.  4. Right atrial size was moderately dilated.  5. A small pericardial effusion is present. The pericardial effusion is circumferential.  6. The mitral valve is normal in structure. No evidence of mitral valve regurgitation.  7. The aortic valve is tricuspid. Aortic valve regurgitation is not visualized. No aortic stenosis is present.  8. The inferior vena cava is dilated in size with >50% respiratory  variability, suggesting right atrial pressure of 8 mmHg. FINDINGS  Left Ventricle: Left ventricular ejection fraction, by estimation, is 60 to 65%. The left ventricle has normal function. The left ventricle has no regional wall motion abnormalities. The left ventricular internal cavity size was normal in size. There is  moderate concentric left ventricular hypertrophy. Left ventricular diastolic parameters are consistent with Grade I diastolic dysfunction (impaired relaxation). Normal left ventricular filling pressure. Right Ventricle: The right ventricular size is mildly enlarged. No increase in right ventricular wall thickness. Right ventricular systolic function is low normal. There is moderately elevated pulmonary artery systolic pressure. The tricuspid regurgitant  velocity is 3.05 m/s, and with an assumed right atrial pressure of 8 mmHg, the estimated right ventricular systolic pressure is 02.7 mmHg. Left Atrium: Left atrial size was moderately dilated. Right Atrium: Right atrial size was moderately dilated. Pericardium: A small pericardial effusion is present. The pericardial effusion is  circumferential. Mitral Valve: The mitral valve is normal in structure. No evidence of mitral valve regurgitation. Tricuspid Valve: The tricuspid valve is normal in structure. Tricuspid valve regurgitation is mild. Aortic Valve: The aortic valve is tricuspid. Aortic valve regurgitation is not visualized. No aortic stenosis is present. Aortic valve peak gradient measures 8.8 mmHg. Pulmonic Valve: The pulmonic valve was normal in structure. Pulmonic valve regurgitation is trivial. Aorta: The aortic root and ascending aorta are structurally normal, with no evidence of dilitation. Venous: The inferior vena cava is dilated in size with greater than 50% respiratory variability, suggesting right atrial pressure of 8 mmHg. IAS/Shunts: No atrial level shunt detected by color flow Doppler. Additional Comments: There is pleural effusion in the left lateral region.  LEFT VENTRICLE PLAX 2D LVIDd:         4.50 cm   Diastology LVIDs:         3.00 cm   LV e' medial:    4.75 cm/s LV PW:         1.50 cm   LV E/e' medial:  12.5 LV IVS:        1.60 cm   LV e' lateral:   11.10 cm/s LVOT diam:     2.00 cm   LV E/e' lateral: 5.3 LV SV:         80 LV SV Index:   40 LVOT Area:     3.14 cm  RIGHT VENTRICLE             IVC RV Basal diam:  3.30 cm     IVC diam: 2.20 cm RV Mid diam:    3.80 cm RV S prime:     11.40 cm/s TAPSE (M-mode): 1.8 cm LEFT ATRIUM             Index        RIGHT ATRIUM           Index LA diam:        4.10 cm 2.02 cm/m   RA Area:     23.60 cm LA Vol (A2C):   87.4 ml 43.14 ml/m  RA Volume:   82.10 ml  40.53 ml/m LA Vol (A4C):   82.8 ml 40.87 ml/m LA Biplane Vol: 85.5 ml 42.21 ml/m  AORTIC VALVE                 PULMONIC VALVE AV Area (Vmax): 2.81 cm     PV Vmax:       0.74 m/s AV Vmax:        148.50 cm/s  PV Peak grad:  2.2 mmHg AV Peak Grad:   8.8 mmHg LVOT Vmax:      133.00 cm/s LVOT Vmean:     83.000 cm/s LVOT VTI:       0.256 m  AORTA Ao Root diam: 3.70 cm Ao Asc diam:  3.40 cm MITRAL VALVE                TRICUSPID VALVE MV Area (PHT): 4.10 cm    TR Peak grad:   37.2 mmHg MV Decel Time: 185 msec    TR Vmax:        305.00 cm/s MV E velocity: 59.20 cm/s MV A velocity: 81.00 cm/s  SHUNTS MV E/A ratio:  0.73        Systemic VTI:  0.26 m                            Systemic Diam: 2.00 cm Mihai Croitoru MD Electronically signed by Sanda Klein MD Signature Date/Time: 02/19/2022/1:06:09 PM    Final      Scheduled Meds:  amLODipine  10 mg Oral q morning   aspirin EC  81 mg Oral q morning   atorvastatin  80 mg Oral q morning   azithromycin  500 mg Oral Daily   clopidogrel  75 mg Oral q morning   heparin  5,000 Units Subcutaneous Q8H   insulin aspart  0-4 Units Subcutaneous QHS   insulin aspart  0-6 Units Subcutaneous TID WC   insulin aspart  3 Units Subcutaneous TID WC   insulin glargine-yfgn  6 Units Subcutaneous QHS   labetalol  100 mg Oral BID   sodium chloride flush  3 mL Intravenous Q12H   sodium chloride flush  3 mL Intravenous Q12H   Continuous Infusions:  sodium chloride     cefTRIAXone (ROCEPHIN)  IV 2 g (02/19/22 2200)     LOS: 1 day   Time spent: 82min  Avrianna Smart C Alizaya Oshea, DO [facility]  If 7PM-7AM, please contact night-coverage [email or web address]  02/20/2022, 7:27 AM

## 2022-02-20 NOTE — Progress Notes (Addendum)
Inpatient Diabetes Program Recommendations  AACE/ADA: New Consensus Statement on Inpatient Glycemic Control (2015)  Target Ranges:  Prepandial:   less than 140 mg/dL      Peak postprandial:   less than 180 mg/dL (1-2 hours)      Critically ill patients:  140 - 180 mg/dL   Lab Results  Component Value Date   GLUCAP 566 (HH) 02/20/2022   HGBA1C 10.7 (H) 10/14/2021    Review of Glycemic Control  Latest Reference Range & Units 02/19/22 08:54 02/19/22 11:36 02/19/22 16:24 02/19/22 20:20 02/20/22 09:27  Glucose-Capillary 70 - 99 mg/dL 320 (H) 286 (H) 246 (H) 220 (H) 566 (HH)  (HH): Data is critically high (H): Data is abnormally high  Diabetes history: DM1 Outpatient Diabetes medications: TResiba 8 units QHS, Humalog 4 units TID with meals Current orders for Inpatient glycemic control: Novolog 0-6 units TID and 0-4 units QHS, Novolog 3 units TID with meals and Semglee 6 units QHS  Inpatient Diabetes Program Recommendations:    Semglee 10 units QHS   Spoke with patient at bedside.  He states he was 56 years old when diagnosed with T1DM.  He confirms above home insulins.  He does occasionally forget his Humalog.  He is current with endocrinologist, Dr. Hartford Poli.  Last saw he on 02/15/22.  A1C was 8.1%.    He is currently using the [employer] 1.  He states he was using the [place] 2 about 10 mos ago when his reader stopped working.  He states he has been trying to get the [facility] 2 reader replaced but they won't send him any more because it hasn't been a year.  He states he likes to use the reader rather than his phone to scan his serum glucose.  Uncertain why [facility] will not send sensors for the [employer] 2.  Explained I have some sample [facility] 2 sensors if he would like but he would have to use his phone.  He would like the samples and will use his phone.  The [place] 2 is better as it alerts him if he is hypoglycemic & hyperglycemic.    He is aware when he is low and treats with apple juice.    Will provide him  with 2 [facility] 2 sensors which will last 14 days each.    Will continue to follow while inpatient.  Thank you, Reche Dixon, MSN, [facility] Diabetes Coordinator Inpatient Diabetes Program [phone] (team pager from 8a-5p)

## 2022-02-21 ENCOUNTER — Inpatient Hospital Stay (HOSPITAL_BASED_OUTPATIENT_CLINIC_OR_DEPARTMENT_OTHER)
Admit: 2022-02-21 | Discharge: 2022-02-21 | Disposition: A | Discharge status: Home / Self Care | Payer: Medicare Other | Attending: Cardiology | Admitting: Cardiology

## 2022-02-21 DIAGNOSIS — R55 Syncope and collapse: Secondary | ICD-10-CM | POA: Diagnosis not present

## 2022-02-21 LAB — GLUCOSE, CAPILLARY
Glucose-Capillary: 120 mg/dL — ABNORMAL HIGH (ref 70–99)
Glucose-Capillary: 132 mg/dL — ABNORMAL HIGH (ref 70–99)
Glucose-Capillary: 149 mg/dL — ABNORMAL HIGH (ref 70–99)

## 2022-02-21 LAB — CBC
HCT: 27.7 % — ABNORMAL LOW (ref 39.0–52.0)
Hemoglobin: 8.8 g/dL — ABNORMAL LOW (ref 13.0–17.0)
MCH: 29 pg (ref 26.0–34.0)
MCHC: 31.8 g/dL (ref 30.0–36.0)
MCV: 91.4 fL (ref 80.0–100.0)
Platelets: 238 10*3/uL (ref 150–400)
RBC: 3.03 MIL/uL — ABNORMAL LOW (ref 4.22–5.81)
RDW: 13.9 % (ref 11.5–15.5)
WBC: 7.7 10*3/uL (ref 4.0–10.5)
nRBC: 0 % (ref 0.0–0.2)

## 2022-02-21 LAB — BASIC METABOLIC PANEL
Anion gap: 8 (ref 5–15)
BUN: 68 mg/dL — ABNORMAL HIGH (ref 6–20)
CO2: 21 mmol/L — ABNORMAL LOW (ref 22–32)
Calcium: 8.8 mg/dL — ABNORMAL LOW (ref 8.9–10.3)
Chloride: 114 mmol/L — ABNORMAL HIGH (ref 98–111)
Creatinine, Ser: 5.99 mg/dL — ABNORMAL HIGH (ref 0.61–1.24)
GFR, Estimated: 10 mL/min — ABNORMAL LOW (ref >60)
Glucose, Bld: 111 mg/dL — ABNORMAL HIGH (ref 70–99)
Potassium: 4.4 mmol/L (ref 3.5–5.1)
Sodium: 143 mmol/L (ref 135–145)

## 2022-02-21 MED ORDER — LABETALOL HCL 200 MG PO TABS: 200.0000 mg | ORAL_TABLET | Freq: Two times a day (BID) | ORAL | 0 refills | Status: DC

## 2022-02-21 MED ORDER — ONETOUCH LANCETS MISC: 0 refills | Status: DC

## 2022-02-21 MED ORDER — HYDRALAZINE HCL 50 MG PO TABS
50.0000 mg | ORAL_TABLET | Freq: Three times a day (TID) | ORAL | Status: DC
  Administered 2022-02-21: 50 mg via ORAL
  Filled 2022-02-21: qty 1

## 2022-02-21 MED ORDER — LABETALOL HCL 200 MG PO TABS
100.0000 mg | ORAL_TABLET | Freq: Once | ORAL | Status: AC
  Administered 2022-02-21: 100 mg via ORAL
  Filled 2022-02-21: qty 1

## 2022-02-21 MED ORDER — LABETALOL HCL 200 MG PO TABS: 200.0000 mg | ORAL_TABLET | Freq: Two times a day (BID) | ORAL | Status: DC

## 2022-02-21 MED ORDER — CARVEDILOL 25 MG PO TABS: 25.0000 mg | ORAL_TABLET | Freq: Two times a day (BID) | ORAL | Status: DC

## 2022-02-21 MED ORDER — INSULIN PEN NEEDLE 32G X 4 MM MISC: 0 refills | Status: DC

## 2022-02-21 MED ORDER — HYDRALAZINE HCL 50 MG PO TABS: 50.0000 mg | ORAL_TABLET | Freq: Three times a day (TID) | ORAL | 0 refills | Status: DC

## 2022-02-21 MED ORDER — HYDRALAZINE HCL 25 MG PO TABS
25.0000 mg | ORAL_TABLET | Freq: Three times a day (TID) | ORAL | Status: DC
Start: 2022-02-21 — End: 2022-02-21
  Administered 2022-02-21: 25 mg via ORAL
  Filled 2022-02-21: qty 1

## 2022-02-21 MED ORDER — INSULIN DEGLUDEC 100 UNIT/ML ~~LOC~~ SOPN: 15.0000 [IU] | PEN_INJECTOR | Freq: Every day | SUBCUTANEOUS | 0 refills | Status: DC

## 2022-02-21 NOTE — Progress Notes (Signed)
Progress Note  Patient Name: Louis Peters Date of Encounter: 02/21/2022  Primary Cardiologist: None   Subjective   Patient seen and examined at his bedside.  Inpatient Medications    Scheduled Meds:  amLODipine  10 mg Oral q morning   aspirin EC  81 mg Oral q morning   atorvastatin  80 mg Oral q morning   azithromycin  500 mg Oral Daily   clopidogrel  75 mg Oral q morning   heparin  5,000 Units Subcutaneous Q8H   hydrALAZINE  25 mg Oral Q8H   insulin aspart  0-15 Units Subcutaneous TID WC   insulin aspart  3 Units Subcutaneous TID WC   insulin glargine-yfgn  15 Units Subcutaneous QHS   labetalol  100 mg Oral BID   sodium chloride flush  3 mL Intravenous Q12H   Continuous Infusions:  cefTRIAXone (ROCEPHIN)  IV 2 g (02/20/22 2204)   PRN Meds: acetaminophen **OR** acetaminophen, senna-docusate   Vital Signs    Vitals:   02/20/22 2132 02/20/22 2202 02/21/22 0458 02/21/22 0822  BP: (!) 168/85  (!) 175/91 (!) 175/88  Pulse: 96 75 78 77  Resp: 18  18 18   Temp: 98.3 F (36.8 C)  98.7 F (37.1 C) 98.6 F (37 C)  TempSrc: Oral  Oral Oral  SpO2: 100%  100% 99%  Weight:   82.2 kg   Height:        Intake/Output Summary (Last 24 hours) at 02/21/2022 0952 Last data filed at 02/21/2022 0103 Gross per 24 hour  Intake 340 ml  Output --  Net 340 ml   Filed Weights   02/19/22 0541 02/20/22 0508 02/21/22 0458  Weight: 84.6 kg 83.1 kg 82.2 kg    Telemetry    Normal sinus rhythm - Personally Reviewed  ECG    None  - Personally Reviewed  Physical Exam    General: Comfortable, sitting up in a chair Head: Atraumatic, normal size  Eyes: PEERLA, EOMI  Neck: Supple, normal JVD Cardiac: Normal S1, S2; RRR; no murmurs, rubs, or gallops Lungs: Clear to auscultation bilaterally Abd: Soft, nontender, no hepatomegaly  Ext: warm, no edema Musculoskeletal: No deformities, BUE and BLE strength normal and equal Skin: Warm and dry, no rashes   Neuro: Alert and oriented  to person, place, time, and situation, CNII-XII grossly intact, no focal deficits  Psych: Normal mood and affect   Labs    Chemistry Recent Labs  Lab 02/18/22 1800 02/19/22 0250 02/20/22 0650 02/21/22 0250  NA 141 139 138 143  K 5.3* 5.4* 5.3* 4.4  CL 111 111 109 114*  CO2 21* 20* 19* 21*  GLUCOSE 256* 300* 449* 111*  BUN 55* 60* 63* 68*  CREATININE 5.24* 6.10* 6.30* 5.99*  CALCIUM 9.0 8.8* 8.7* 8.8*  PROT 6.5  --   --   --   ALBUMIN 3.4*  --   --   --   AST 33  --   --   --   ALT 28  --   --   --   ALKPHOS 81  --   --   --   BILITOT 1.2  --   --   --   GFRNONAA 12* 10* 10* 10*  ANIONGAP 9 8 10 8      Hematology Recent Labs  Lab 02/19/22 0250 02/20/22 0650 02/21/22 0250  WBC 8.4 7.6 7.7  RBC 3.03* 3.06* 3.03*  HGB 8.8* 8.9* 8.8*  HCT 28.1* 27.9* 27.7*  MCV 92.7  91.2 91.4  MCH 29.0 29.1 29.0  MCHC 31.3 31.9 31.8  RDW 14.4 14.1 13.9  PLT 202 220 238    Cardiac EnzymesNo results for input(s): TROPONINI in the last 168 hours. No results for input(s): TROPIPOC in the last 168 hours.   BNP Recent Labs  Lab 02/18/22 2015  BNP 1,495.2*     DDimer No results for input(s): DDIMER in the last 168 hours.   Radiology    ECHOCARDIOGRAM COMPLETE  Result Date: 02/19/2022    ECHOCARDIOGRAM REPORT   Patient Name:   Louis Peters Date of Exam: 02/19/2022 Medical Rec #:  [id number]       Height:       70.0 in Accession #:    [id number]      Weight:       186.4 lb Date of Birth:  01-25-1966      BSA:          2.026 m Patient Age:    56 years        BP:           152/90 mmHg Patient Gender: M               HR:           76 bpm. Exam Location:  Inpatient Procedure: 2D Echo, Cardiac Doppler and Color Doppler Indications:    Elevated troponin  History:        Patient has prior history of Echocardiogram examinations, most                 recent 07/22/2020. Risk Factors:Hypertension and Diabetes.  Sonographer:    Jefferey Pica Referring Phys: [id number] [place]   1. Left ventricular ejection fraction, by estimation, is 60 to 65%. The left ventricle has normal function. The left ventricle has no regional wall motion abnormalities. There is moderate concentric left ventricular hypertrophy. Left ventricular diastolic parameters are consistent with Grade I diastolic dysfunction (impaired relaxation).  2. Right ventricular systolic function is low normal. The right ventricular size is mildly enlarged. There is moderately elevated pulmonary artery systolic pressure.  3. Left atrial size was moderately dilated.  4. Right atrial size was moderately dilated.  5. A small pericardial effusion is present. The pericardial effusion is circumferential.  6. The mitral valve is normal in structure. No evidence of mitral valve regurgitation.  7. The aortic valve is tricuspid. Aortic valve regurgitation is not visualized. No aortic stenosis is present.  8. The inferior vena cava is dilated in size with >50% respiratory variability, suggesting right atrial pressure of 8 mmHg. FINDINGS  Left Ventricle: Left ventricular ejection fraction, by estimation, is 60 to 65%. The left ventricle has normal function. The left ventricle has no regional wall motion abnormalities. The left ventricular internal cavity size was normal in size. There is  moderate concentric left ventricular hypertrophy. Left ventricular diastolic parameters are consistent with Grade I diastolic dysfunction (impaired relaxation). Normal left ventricular filling pressure. Right Ventricle: The right ventricular size is mildly enlarged. No increase in right ventricular wall thickness. Right ventricular systolic function is low normal. There is moderately elevated pulmonary artery systolic pressure. The tricuspid regurgitant  velocity is 3.05 m/s, and with an assumed right atrial pressure of 8 mmHg, the estimated right ventricular systolic pressure is 48.1 mmHg. Left Atrium: Left atrial size was moderately dilated. Right Atrium: Right  atrial size was moderately dilated. Pericardium: A small pericardial effusion is present. The pericardial effusion is circumferential.  Mitral Valve: The mitral valve is normal in structure. No evidence of mitral valve regurgitation. Tricuspid Valve: The tricuspid valve is normal in structure. Tricuspid valve regurgitation is mild. Aortic Valve: The aortic valve is tricuspid. Aortic valve regurgitation is not visualized. No aortic stenosis is present. Aortic valve peak gradient measures 8.8 mmHg. Pulmonic Valve: The pulmonic valve was normal in structure. Pulmonic valve regurgitation is trivial. Aorta: The aortic root and ascending aorta are structurally normal, with no evidence of dilitation. Venous: The inferior vena cava is dilated in size with greater than 50% respiratory variability, suggesting right atrial pressure of 8 mmHg. IAS/Shunts: No atrial level shunt detected by color flow Doppler. Additional Comments: There is pleural effusion in the left lateral region.  LEFT VENTRICLE PLAX 2D LVIDd:         4.50 cm   Diastology LVIDs:         3.00 cm   LV e' medial:    4.75 cm/s LV PW:         1.50 cm   LV E/e' medial:  12.5 LV IVS:        1.60 cm   LV e' lateral:   11.10 cm/s LVOT diam:     2.00 cm   LV E/e' lateral: 5.3 LV SV:         80 LV SV Index:   40 LVOT Area:     3.14 cm  RIGHT VENTRICLE             IVC RV Basal diam:  3.30 cm     IVC diam: 2.20 cm RV Mid diam:    3.80 cm RV S prime:     11.40 cm/s TAPSE (M-mode): 1.8 cm LEFT ATRIUM             Index        RIGHT ATRIUM           Index LA diam:        4.10 cm 2.02 cm/m   RA Area:     23.60 cm LA Vol (A2C):   87.4 ml 43.14 ml/m  RA Volume:   82.10 ml  40.53 ml/m LA Vol (A4C):   82.8 ml 40.87 ml/m LA Biplane Vol: 85.5 ml 42.21 ml/m  AORTIC VALVE                 PULMONIC VALVE AV Area (Vmax): 2.81 cm     PV Vmax:       0.74 m/s AV Vmax:        148.50 cm/s  PV Peak grad:  2.2 mmHg AV Peak Grad:   8.8 mmHg LVOT Vmax:      133.00 cm/s LVOT Vmean:      83.000 cm/s LVOT VTI:       0.256 m  AORTA Ao Root diam: 3.70 cm Ao Asc diam:  3.40 cm MITRAL VALVE               TRICUSPID VALVE MV Area (PHT): 4.10 cm    TR Peak grad:   37.2 mmHg MV Decel Time: 185 msec    TR Vmax:        305.00 cm/s MV E velocity: 59.20 cm/s MV A velocity: 81.00 cm/s  SHUNTS MV E/A ratio:  0.73        Systemic VTI:  0.26 m  Systemic Diam: 2.00 cm Sanda Klein MD Electronically signed by Sanda Klein MD Signature Date/Time: 02/19/2022/1:06:09 PM    Final     Cardiac Studies   Echocardiogram 02/19/2022: Impressions: 1. Left ventricular ejection fraction, by estimation, is 60 to 65%. The  left ventricle has normal function. The left ventricle has no regional  wall motion abnormalities. There is moderate concentric left ventricular  hypertrophy. Left ventricular  diastolic parameters are consistent with Grade I diastolic dysfunction  (impaired relaxation).   2. Right ventricular systolic function is low normal. The right  ventricular size is mildly enlarged. There is moderately elevated  pulmonary artery systolic pressure.   3. Left atrial size was moderately dilated.   4. Right atrial size was moderately dilated.   5. A small pericardial effusion is present. The pericardial effusion is  circumferential.   6. The mitral valve is normal in structure. No evidence of mitral valve  regurgitation.   7. The aortic valve is tricuspid. Aortic valve regurgitation is not  visualized. No aortic stenosis is present.   8. The inferior vena cava is dilated in size with >50% respiratory  variability, suggesting right atrial pressure of 8 mmHg.   Patient Profile     56 y.o. male with a history of CVA, hypertension, hyperlipidemia, type 1 diabetes mellitus, and CKD stage V who is being seen for evaluation of near syncope and elevated troponin.  Assessment & Plan    Near Syncope - he has had no recurrent syncope since his hospitalization. Plans for monitor  on disharge.   Hypertension - not at target ; increase hydralazine to 50 mg 3 times a day as well as labetalol 200 mg daily.  Continue amlodipine 10 mg daily.  Elevated Troponin - No chest pain, Continue aspirin, beta-blocker, and statin.  Troponin elevation felt to be due to CKD stage V. No additional ischemic work-up necessary at this time.  Acute Diastolic heart failure - compensated  CKD stage V - avoid nephrotoxins  DM1 -Per primary team  Hx of CVA -continue aspirin and statin     For questions or updates, please contact [place] Please consult [email or web address] for contact info under Cardiology/STEMI.      Signed, Berniece Salines, DO  02/21/2022, 9:52 AM

## 2022-02-21 NOTE — Discharge Summary (Signed)
Physician Discharge Summary  Louis Peters MWN:[id number] DOB: 06-Aug-1966 DOA: 02/18/2022  PCP: Bernerd Limbo, MD  Admit date: 02/18/2022 Discharge date: 02/21/2022  Admitted From: Home Disposition: Home  Recommendations for Outpatient Follow-up:  Follow up with PCP in 1-2 weeks Follow-up with cardiology in 1 to 2 weeks  Home Health: None Equipment/Devices: None  Discharge Condition: Stable CODE STATUS: Full Diet recommendation: Low-salt low-fat diet  Brief/Interim Summary: Louis Peters is a pleasant 56 y.o. 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 More acutely noted near syncope/dizziness x3 which provoked his trip to the ED. In ED imaging concerning for bibasilar pneumonia/effusions. Admitted for further evaluation and treatment.   Assessment & Plan:   Principal Problem:   Near syncope Active Problems:   Type 1 diabetes mellitus with stage 5 chronic kidney disease and hypertension (HCC)   CKD (chronic kidney disease), stage V (HCC)   Hypertension   Community acquired pneumonia   Elevated troponin     Community acquired pneumonia ruled out Vs possible bilateral effusions from central process - rule out heart failure - Grade 1 diastolic dysfunction on echocardiogram, unlikely etiology for patient's symptoms - Does NOT meet sepsis criteria, is not hypoxic - Bibasilar infiltrate on imaging; questionable chronicity - Discontinue antibiotics - Cultures were unfortunately not be collected prior to starting antibiotics  - Procalcitonin reassuringly low   Near-syncope  Elevated troponin, rule out NSTEMI - Unclear if near-syncope is due to hypotension/hypoxia with coughing episode which could explain elevated troponin due  to supply/demand mismatch - Orthostatics repeated today, unremarkable. - Continue outpatient work-up with cardiology - Echo shows grade 1 diastolic dysfunction   CKD V, stable Concurrent mild hyperkalemia  - At baseline, creatinine around 5   - He has AVF in right arm but not on dialysis  -Potassium within normal limits   Hypertension  -Continue increased regimen to amlodipine, labetolol and hydralazine per cardiology   Type I DM, uncontrolled with hyperglycemia - A1c was 8.1% in May 2023  -Continue home insulin at increased dosing, follow-up with PCP    Discharge Diagnoses:  Principal Problem:   Near syncope Active Problems:   Type 1 diabetes mellitus with stage 5 chronic kidney disease and hypertension (HCC)   CKD (chronic kidney disease), stage V (HCC)   Hypertension   Community acquired pneumonia   Elevated troponin    Discharge Instructions  Discharge Instructions     Discharge patient   Complete by: As directed    Discharge disposition: 01-Home or Self Care   Discharge patient date: 02/21/2022      Allergies as of 02/21/2022       Reactions   Fexofenadine Rash   Latex Rash        Medication List     TAKE these medications    amLODipine 10 MG tablet Commonly known as: NORVASC Take 10 mg by mouth every morning. Notes to patient: Lowers blood pressure    ammonium lactate 12 % lotion Commonly known as: LAC-HYDRIN Apply 1 application topically daily as needed for dry skin.   aspirin EC 81 MG tablet Take 1 tablet (81 mg total) by mouth daily. Swallow whole. What changed: when to take this   atorvastatin 80 MG tablet Commonly known as: LIPITOR Take 1 tablet (80 mg  total) by mouth daily. What changed: when to take this   Baqsimi One Pack 3 MG/DOSE Powd Generic drug: Glucagon Place 3 mg into the nose once as needed (low blood sugar).   cholecalciferol 25 MCG (1000 UNIT) tablet Commonly known as: VITAMIN D3 Take 1,000 Units by mouth every  morning. Notes to patient: Supplement    clopidogrel 75 MG tablet Commonly known as: PLAVIX Take 75 mg by mouth every morning. Notes to patient: Blood thinner    FreeStyle Libre 2 Reader Devi by Does not apply route.   FreeStyle Libre 2 Sensor Misc by Does not apply route.   HumaLOG KwikPen 200 UNIT/ML KwikPen Generic drug: insulin lispro Inject 4 Units into the skin in the morning, at noon, and at bedtime. Notes to patient: Blood sugar control    hydrALAZINE 50 MG tablet Commonly known as: APRESOLINE Take 1 tablet (50 mg total) by mouth every 8 (eight) hours. What changed:  medication strength how much to take when to take this Notes to patient: Lowers blood pressure    insulin degludec 100 UNIT/ML FlexTouch Pen Commonly known as: TRESIBA Inject 15 Units into the skin at bedtime. What changed: how much to take Notes to patient: Controls blood sugar    Insulin Pen Needle 32G X 4 MM Misc Use with lantus and humalog 4 imes per day   labetalol 200 MG tablet Commonly known as: NORMODYNE Take 1 tablet (200 mg total) by mouth 2 (two) times daily. What changed:  medication strength how much to take when to take this   ondansetron 4 MG disintegrating tablet Commonly known as: ZOFRAN-ODT Take 1 tablet (4 mg total) by mouth every 8 (eight) hours as needed for nausea or vomiting.   ONE TOUCH LANCETS Misc Use to check blood sugar 8 time(s) daily   OneTouch Verio Flex System w/Device Kit by Does not apply route.   OneTouch Verio test strip Generic drug: glucose blood SMARTSIG:Via Meter 8 Times Daily   Retacrit 10000 UNIT/ML injection Generic drug: epoetin alfa-epbx Inject 10,000 Units into the vein every 30 (thirty) days.        Allergies  Allergen Reactions   Fexofenadine Rash   Latex Rash    Consultations: Cardiology  Procedures/Studies: CT HEAD WO CONTRAST (5MM)  Result Date: 02/18/2022 CLINICAL DATA:  Altered mental status. EXAM: CT HEAD WITHOUT  CONTRAST TECHNIQUE: Contiguous axial images were obtained from the base of the skull through the vertex without intravenous contrast. RADIATION DOSE REDUCTION: This exam was performed according to the departmental dose-optimization program which includes automated exposure control, adjustment of the mA and/or kV according to patient size and/or use of iterative reconstruction technique. COMPARISON:  03/20/2021 FINDINGS: Brain: No evidence of intracranial hemorrhage, acute infarction, hydrocephalus, extra-axial collection, or mass lesion/mass effect. Stable mild generalized cerebral atrophy and chronic small vessel disease. Vascular:  No hyperdense vessel or other acute findings. Skull: No evidence of fracture or other significant bone abnormality. Sinuses/Orbits: No acute findings. Chronic opacification of the left frontal and bilateral ethmoid sinuses is again seen. Mucosal thickening noted in the right maxillary sinus. Other: None. IMPRESSION: No acute intracranial abnormality. Stable mild cerebral atrophy and chronic small vessel disease. Chronic sinusitis. Electronically Signed   By: Marlaine Hind M.D.   On: 02/18/2022 17:41   CT CHEST WO CONTRAST  Result Date: 02/18/2022 CLINICAL DATA:  Dyspnea, chronic, unclear etiology SOB, possible pericardial effusion, patient has Cr of 5 EXAM: CT CHEST WITHOUT CONTRAST TECHNIQUE: Multidetector CT imaging of the chest  was performed following the standard protocol without IV contrast. RADIATION DOSE REDUCTION: This exam was performed according to the departmental dose-optimization program which includes automated exposure control, adjustment of the mA and/or kV according to patient size and/or use of iterative reconstruction technique. COMPARISON:  Same day chest x-ray, abdominal CT 04/12/2021 FINDINGS: Cardiovascular: Heart is mildly enlarged. Small pericardial effusion, stable in size from prior CT. Thoracic aorta is nonaneurysmal. Scattered atherosclerotic  calcifications of the aorta and coronary arteries. Pulmonary trunk is upper limits of normal in size. Mediastinum/Nodes: No axillary or mediastinal lymphadenopathy. Evaluation of the hilar structures is limited in the absence of intravenous contrast. Within this limitation, no obvious hilar adenopathy or mass is identified. Thyroid, trachea, and esophagus demonstrate no significant findings. Lungs/Pleura: Small layering bilateral pleural effusions with associated bibasilar airspace consolidation. Remaining lung fields are otherwise clear. No pneumothorax. Upper Abdomen: No acute abnormality. Musculoskeletal: Diffuse anasarca. Chronic superior endplate compression fracture of T12. No new or acute osseous findings. IMPRESSION: 1. Small layering bilateral pleural effusions with associated bibasilar airspace consolidation, which may represent atelectasis or pneumonia. 2. Mild cardiomegaly with small pericardial effusion, stable in size from prior CT. 3. Diffuse anasarca. 4. Aortic and coronary artery atherosclerosis (ICD10-I70.0). Electronically Signed   By: Davina Poke D.O.   On: 02/18/2022 20:41   DG Chest Port 1 View  Result Date: 02/18/2022 CLINICAL DATA:  Cough with shortness of breath. EXAM: PORTABLE CHEST 1 VIEW COMPARISON:  Chest x-ray 10/01/2021 FINDINGS: The cardiac silhouette appears mildly enlarged, a new finding. This may be partially projectional secondary to lordotic positioning. There is no lung consolidation, pleural effusion or pneumothorax visualized. Osseous structures are within normal limits. IMPRESSION: 1. Mild enlargement of the cardiac silhouette, new from prior. This may be projectional, but new cardiomegaly or pericardial effusion can not be excluded. Consider further evaluation with upright PA and lateral chest x-ray. Electronically Signed   By: Ronney Asters M.D.   On: 02/18/2022 18:42   ECHOCARDIOGRAM COMPLETE  Result Date: 02/19/2022    ECHOCARDIOGRAM REPORT   Patient Name:    Louis Peters Date of Exam: 02/19/2022 Medical Rec #:  [id number]       Height:       70.0 in Accession #:    [id number]      Weight:       186.4 lb Date of Birth:  [birth year]      BSA:          2.026 m Patient Age:    74 years        BP:           152/90 mmHg Patient Gender: M               HR:           76 bpm. Exam Location:  Inpatient Procedure: 2D Echo, Cardiac Doppler and Color Doppler Indications:    Elevated troponin  History:        Patient has prior history of Echocardiogram examinations, most                 recent 07/22/2020. Risk Factors:Hypertension and Diabetes.  Sonographer:    Jefferey Pica Referring Phys: [id number] [place]  1. Left ventricular ejection fraction, by estimation, is 60 to 65%. The left ventricle has normal function. The left ventricle has no regional wall motion abnormalities. There is moderate concentric left ventricular hypertrophy. Left ventricular diastolic parameters are consistent with Grade I diastolic dysfunction (impaired relaxation).  2. Right ventricular systolic function is low normal. The right ventricular size is mildly enlarged. There is moderately elevated pulmonary artery systolic pressure.  3. Left atrial size was moderately dilated.  4. Right atrial size was moderately dilated.  5. A small pericardial effusion is present. The pericardial effusion is circumferential.  6. The mitral valve is normal in structure. No evidence of mitral valve regurgitation.  7. The aortic valve is tricuspid. Aortic valve regurgitation is not visualized. No aortic stenosis is present.  8. The inferior vena cava is dilated in size with >50% respiratory variability, suggesting right atrial pressure of 8 mmHg. FINDINGS  Left Ventricle: Left ventricular ejection fraction, by estimation, is 60 to 65%. The left ventricle has normal function. The left ventricle has no regional wall motion abnormalities. The left ventricular internal cavity size was normal in size. There is   moderate concentric left ventricular hypertrophy. Left ventricular diastolic parameters are consistent with Grade I diastolic dysfunction (impaired relaxation). Normal left ventricular filling pressure. Right Ventricle: The right ventricular size is mildly enlarged. No increase in right ventricular wall thickness. Right ventricular systolic function is low normal. There is moderately elevated pulmonary artery systolic pressure. The tricuspid regurgitant  velocity is 3.05 m/s, and with an assumed right atrial pressure of 8 mmHg, the estimated right ventricular systolic pressure is 37.6 mmHg. Left Atrium: Left atrial size was moderately dilated. Right Atrium: Right atrial size was moderately dilated. Pericardium: A small pericardial effusion is present. The pericardial effusion is circumferential. Mitral Valve: The mitral valve is normal in structure. No evidence of mitral valve regurgitation. Tricuspid Valve: The tricuspid valve is normal in structure. Tricuspid valve regurgitation is mild. Aortic Valve: The aortic valve is tricuspid. Aortic valve regurgitation is not visualized. No aortic stenosis is present. Aortic valve peak gradient measures 8.8 mmHg. Pulmonic Valve: The pulmonic valve was normal in structure. Pulmonic valve regurgitation is trivial. Aorta: The aortic root and ascending aorta are structurally normal, with no evidence of dilitation. Venous: The inferior vena cava is dilated in size with greater than 50% respiratory variability, suggesting right atrial pressure of 8 mmHg. IAS/Shunts: No atrial level shunt detected by color flow Doppler. Additional Comments: There is pleural effusion in the left lateral region.  LEFT VENTRICLE PLAX 2D LVIDd:         4.50 cm   Diastology LVIDs:         3.00 cm   LV e' medial:    4.75 cm/s LV PW:         1.50 cm   LV E/e' medial:  12.5 LV IVS:        1.60 cm   LV e' lateral:   11.10 cm/s LVOT diam:     2.00 cm   LV E/e' lateral: 5.3 LV SV:         80 LV SV Index:   40  LVOT Area:     3.14 cm  RIGHT VENTRICLE             IVC RV Basal diam:  3.30 cm     IVC diam: 2.20 cm RV Mid diam:    3.80 cm RV S prime:     11.40 cm/s TAPSE (M-mode): 1.8 cm LEFT ATRIUM             Index        RIGHT ATRIUM           Index LA diam:        4.10 cm  2.02 cm/m   RA Area:     23.60 cm LA Vol (A2C):   87.4 ml 43.14 ml/m  RA Volume:   82.10 ml  40.53 ml/m LA Vol (A4C):   82.8 ml 40.87 ml/m LA Biplane Vol: 85.5 ml 42.21 ml/m  AORTIC VALVE                 PULMONIC VALVE AV Area (Vmax): 2.81 cm     PV Vmax:       0.74 m/s AV Vmax:        148.50 cm/s  PV Peak grad:  2.2 mmHg AV Peak Grad:   8.8 mmHg LVOT Vmax:      133.00 cm/s LVOT Vmean:     83.000 cm/s LVOT VTI:       0.256 m  AORTA Ao Root diam: 3.70 cm Ao Asc diam:  3.40 cm MITRAL VALVE               TRICUSPID VALVE MV Area (PHT): 4.10 cm    TR Peak grad:   37.2 mmHg MV Decel Time: 185 msec    TR Vmax:        305.00 cm/s MV E velocity: 59.20 cm/s MV A velocity: 81.00 cm/s  SHUNTS MV E/A ratio:  0.73        Systemic VTI:  0.26 m                            Systemic Diam: 2.00 cm Mihai Croitoru MD Electronically signed by Sanda Klein MD Signature Date/Time: 02/19/2022/1:06:09 PM    Final      Subjective: No acute issues or events overnight   Discharge Exam: Vitals:   02/21/22 0458 02/21/22 0822  BP: (!) 175/91 (!) 175/88  Pulse: 78 77  Resp: 18 18  Temp: 98.7 F (37.1 C) 98.6 F (37 C)  SpO2: 100% 99%   Vitals:   02/20/22 2132 02/20/22 2202 02/21/22 0458 02/21/22 0822  BP: (!) 168/85  (!) 175/91 (!) 175/88  Pulse: 96 75 78 77  Resp: 18  18 18   Temp: 98.3 F (36.8 C)  98.7 F (37.1 C) 98.6 F (37 C)  TempSrc: Oral  Oral Oral  SpO2: 100%  100% 99%  Weight:   82.2 kg   Height:        General: Pt is alert, awake, not in acute distress Cardiovascular: RRR, S1/S2 +, no rubs, no gallops Respiratory: CTA bilaterally, no wheezing, no rhonchi Abdominal: Soft, NT, ND, bowel sounds + Extremities: no edema, no  cyanosis    The results of significant diagnostics from this hospitalization (including imaging, microbiology, ancillary and laboratory) are listed below for reference.     Microbiology: Recent Results (from the past 240 hour(s))  Resp Panel by RT-PCR (Flu A&B, Covid) Nasopharyngeal Swab     Status: None   Collection Time: 02/18/22  6:02 PM   Specimen: Nasopharyngeal Swab; Nasopharyngeal(NP) swabs in vial transport medium  Result Value Ref Range Status   SARS Coronavirus 2 by RT PCR NEGATIVE NEGATIVE Final    Comment: (NOTE) SARS-CoV-2 target nucleic acids are NOT DETECTED.  The SARS-CoV-2 RNA is generally detectable in upper respiratory specimens during the acute phase of infection. The lowest concentration of SARS-CoV-2 viral copies this assay can detect is 138 copies/mL. A negative result does not preclude SARS-Cov-2 infection and should not be used as the sole basis for treatment or other patient management decisions. A  negative result may occur with  improper specimen collection/handling, submission of specimen other than nasopharyngeal swab, presence of viral mutation(s) within the areas targeted by this assay, and inadequate number of viral copies(<138 copies/mL). A negative result must be combined with clinical observations, patient history, and epidemiological information. The expected result is Negative.  Fact Sheet for Patients:  EntrepreneurPulse.com.au  Fact Sheet for Healthcare Providers:  IncredibleEmployment.be  This test is no t yet approved or cleared by the [place] FDA and  has been authorized for detection and/or diagnosis of SARS-CoV-2 by FDA under an Emergency Use Authorization (EUA). This EUA will remain  in effect (meaning this test can be used) for the duration of the COVID-19 declaration under Section 564(b)(1) of the Act, 21 U.S.C.section 360bbb-3(b)(1), unless the authorization is terminated  or revoked  sooner.       Influenza A by PCR NEGATIVE NEGATIVE Final   Influenza B by PCR NEGATIVE NEGATIVE Final    Comment: (NOTE) The Xpert Xpress SARS-CoV-2/FLU/RSV plus assay is intended as an aid in the diagnosis of influenza from Nasopharyngeal swab specimens and should not be used as a sole basis for treatment. Nasal washings and aspirates are unacceptable for Xpert Xpress SARS-CoV-2/FLU/RSV testing.  Fact Sheet for Patients: EntrepreneurPulse.com.au  Fact Sheet for Healthcare Providers: IncredibleEmployment.be  This test is not yet approved or cleared by the [place] FDA and has been authorized for detection and/or diagnosis of SARS-CoV-2 by FDA under an Emergency Use Authorization (EUA). This EUA will remain in effect (meaning this test can be used) for the duration of the COVID-19 declaration under Section 564(b)(1) of the Act, 21 U.S.C. section 360bbb-3(b)(1), unless the authorization is terminated or revoked.  Performed at [facility], [employer] [address] [place],  [address]      Labs: BNP (last 3 results) Recent Labs    02/18/22 2015  BNP 6,045.4*   Basic Metabolic Panel: Recent Labs  Lab 02/18/22 1800 02/19/22 0250 02/20/22 0650 02/21/22 0250  NA 141 139 138 143  K 5.3* 5.4* 5.3* 4.4  CL 111 111 109 114*  CO2 21* 20* 19* 21*  GLUCOSE 256* 300* 449* 111*  BUN 55* 60* 63* 68*  CREATININE 5.24* 6.10* 6.30* 5.99*  CALCIUM 9.0 8.8* 8.7* 8.8*   Liver Function Tests: Recent Labs  Lab 02/18/22 1800  AST 33  ALT 28  ALKPHOS 81  BILITOT 1.2  PROT 6.5  ALBUMIN 3.4*   No results for input(s): LIPASE, AMYLASE in the last 168 hours. Recent Labs  Lab 02/18/22 2015  AMMONIA 13   CBC: Recent Labs  Lab 02/18/22 1800 02/19/22 0250 02/20/22 0650 02/21/22 0250  WBC 9.5 8.4 7.6 7.7  NEUTROABS 8.1*  --   --   --   HGB 9.9* 8.8* 8.9* 8.8*  HCT 31.6* 28.1* 27.9* 27.7*  MCV 94.0 92.7 91.2 91.4   PLT 211 202 220 238   Cardiac Enzymes: Recent Labs  Lab 02/18/22 1924  CKTOTAL 335   BNP: Invalid input(s): POCBNP CBG: Recent Labs  Lab 02/20/22 1633 02/20/22 2130 02/21/22 0456 02/21/22 0808 02/21/22 1152  GLUCAP 332* 73 120* 132* 149*   D-Dimer No results for input(s): DDIMER in the last 72 hours. Hgb A1c No results for input(s): HGBA1C in the last 72 hours. Lipid Profile No results for input(s): CHOL, HDL, LDLCALC, TRIG, CHOLHDL, LDLDIRECT in the last 72 hours. Thyroid function studies No results for input(s): TSH, T4TOTAL, T3FREE, THYROIDAB in the last 72 hours.  Invalid  input(s): FREET3 Anemia work up No results for input(s): VITAMINB12, FOLATE, FERRITIN, TIBC, IRON, RETICCTPCT in the last 72 hours. Urinalysis    Component Value Date/Time   COLORURINE YELLOW 02/18/2022 [place] 02/18/2022 1840   LABSPEC 1.015 02/18/2022 1840   PHURINE 5.0 02/18/2022 1840   GLUCOSEU >=500 (A) 02/18/2022 1840   HGBUR SMALL (A) 02/18/2022 1840   BILIRUBINUR NEGATIVE 02/18/2022 1840   KETONESUR NEGATIVE 02/18/2022 1840   PROTEINUR >=300 (A) 02/18/2022 1840   NITRITE NEGATIVE 02/18/2022 1840   LEUKOCYTESUR NEGATIVE 02/18/2022 1840   Sepsis Labs Invalid input(s): PROCALCITONIN,  WBC,  LACTICIDVEN Microbiology Recent Results (from the past 240 hour(s))  Resp Panel by RT-PCR (Flu A&B, Covid) Nasopharyngeal Swab     Status: None   Collection Time: 02/18/22  6:02 PM   Specimen: Nasopharyngeal Swab; Nasopharyngeal(NP) swabs in vial transport medium  Result Value Ref Range Status   SARS Coronavirus 2 by RT PCR NEGATIVE NEGATIVE Final    Comment: (NOTE) SARS-CoV-2 target nucleic acids are NOT DETECTED.  The SARS-CoV-2 RNA is generally detectable in upper respiratory specimens during the acute phase of infection. The lowest concentration of SARS-CoV-2 viral copies this assay can detect is 138 copies/mL. A negative result does not preclude SARS-Cov-2 infection  and should not be used as the sole basis for treatment or other patient management decisions. A negative result may occur with  improper specimen collection/handling, submission of specimen other than nasopharyngeal swab, presence of viral mutation(s) within the areas targeted by this assay, and inadequate number of viral copies(<138 copies/mL). A negative result must be combined with clinical observations, patient history, and epidemiological information. The expected result is Negative.  Fact Sheet for Patients:  EntrepreneurPulse.com.au  Fact Sheet for Healthcare Providers:  IncredibleEmployment.be  This test is no t yet approved or cleared by the [place] FDA and  has been authorized for detection and/or diagnosis of SARS-CoV-2 by FDA under an Emergency Use Authorization (EUA). This EUA will remain  in effect (meaning this test can be used) for the duration of the COVID-19 declaration under Section 564(b)(1) of the Act, 21 U.S.C.section 360bbb-3(b)(1), unless the authorization is terminated  or revoked sooner.       Influenza A by PCR NEGATIVE NEGATIVE Final   Influenza B by PCR NEGATIVE NEGATIVE Final    Comment: (NOTE) The Xpert Xpress SARS-CoV-2/FLU/RSV plus assay is intended as an aid in the diagnosis of influenza from Nasopharyngeal swab specimens and should not be used as a sole basis for treatment. Nasal washings and aspirates are unacceptable for Xpert Xpress SARS-CoV-2/FLU/RSV testing.  Fact Sheet for Patients: EntrepreneurPulse.com.au  Fact Sheet for Healthcare Providers: IncredibleEmployment.be  This test is not yet approved or cleared by the [place] FDA and has been authorized for detection and/or diagnosis of SARS-CoV-2 by FDA under an Emergency Use Authorization (EUA). This EUA will remain in effect (meaning this test can be used) for the duration of the COVID-19 declaration  under Section 564(b)(1) of the Act, 21 U.S.C. section 360bbb-3(b)(1), unless the authorization is terminated or revoked.  Performed at [facility], [place] [address], [place], [place] [address]      Time coordinating discharge: Over 30 minutes  SIGNED:   Little Ishikawa, DO [facility] 02/21/2022, 7:25 PM Pager   If 7PM-7AM, please contact night-coverage [email or web address]

## 2022-02-21 NOTE — Plan of Care (Signed)

## 2022-02-28 ENCOUNTER — Inpatient Hospital Stay (HOSPITAL_COMMUNITY): Admission: RE | Admit: 2022-02-28 | Payer: Medicare Other | Source: Ambulatory Visit

## 2022-03-06 ENCOUNTER — Ambulatory Visit (HOSPITAL_COMMUNITY)
Admission: RE | Admit: 2022-03-06 | Discharge: 2022-03-06 | Disposition: A | Discharge status: Home / Self Care | Payer: Medicare Other | Source: Ambulatory Visit | Attending: Nephrology | Admitting: Nephrology

## 2022-03-06 ENCOUNTER — Ambulatory Visit: Payer: Medicare Other | Admitting: Dietician

## 2022-03-06 VITALS — BP 179/89 | HR 77 | Temp 97.3°F | Resp 18

## 2022-03-06 DIAGNOSIS — N185 Chronic kidney disease, stage 5: Secondary | ICD-10-CM | POA: Insufficient documentation

## 2022-03-06 LAB — IRON AND TIBC
Iron: 91 ug/dL (ref 45–182)
Saturation Ratios: 37 % (ref 17.9–39.5)
TIBC: 245 ug/dL — ABNORMAL LOW (ref 250–450)
UIBC: 154 ug/dL

## 2022-03-06 LAB — POCT HEMOGLOBIN-HEMACUE: Hemoglobin: 9.4 g/dL — ABNORMAL LOW (ref 13.0–17.0)

## 2022-03-06 LAB — FERRITIN: Ferritin: 202 ng/mL (ref 24–336)

## 2022-03-06 MED ORDER — EPOETIN ALFA-EPBX 10000 UNIT/ML IJ SOLN: 10000.0000 [IU] | INTRAMUSCULAR | Status: DC

## 2022-03-06 MED ORDER — EPOETIN ALFA-EPBX 10000 UNIT/ML IJ SOLN
INTRAMUSCULAR | Status: AC
  Administered 2022-03-06: 10000 [IU] via SUBCUTANEOUS
  Filled 2022-03-06: qty 1

## 2022-03-17 NOTE — Addendum Note (Signed)
Encounter addended by: Gerarda Gunther on: 03/17/2022 4:49 PM  Actions taken: Imaging Exam ended

## 2022-03-20 ENCOUNTER — Ambulatory Visit: Payer: Medicare Other | Admitting: Dietician

## 2022-03-22 ENCOUNTER — Encounter (INDEPENDENT_AMBULATORY_CARE_PROVIDER_SITE_OTHER): Payer: Medicare Other | Admitting: Ophthalmology

## 2022-03-22 DIAGNOSIS — H25813 Combined forms of age-related cataract, bilateral: Secondary | ICD-10-CM

## 2022-03-22 DIAGNOSIS — E113512 Type 2 diabetes mellitus with proliferative diabetic retinopathy with macular edema, left eye: Secondary | ICD-10-CM

## 2022-03-22 DIAGNOSIS — I10 Essential (primary) hypertension: Secondary | ICD-10-CM

## 2022-03-22 DIAGNOSIS — H35033 Hypertensive retinopathy, bilateral: Secondary | ICD-10-CM

## 2022-03-22 DIAGNOSIS — E113521 Type 2 diabetes mellitus with proliferative diabetic retinopathy with traction retinal detachment involving the macula, right eye: Secondary | ICD-10-CM

## 2022-03-27 ENCOUNTER — Encounter (HOSPITAL_COMMUNITY): Payer: Medicare Other

## 2022-04-03 ENCOUNTER — Encounter (HOSPITAL_COMMUNITY): Payer: Medicare Other

## 2022-04-11 NOTE — Progress Notes (Signed)
[facility] Note  04/12/2022                        CHIEF COMPLAINT Patient presents for Retina Follow Up  HISTORY OF PRESENT ILLNESS: Louis Peters is a 56 y.o. male who presents to the clinic today for:   HPI     Retina Follow Up   Patient presents with  Diabetic Retinopathy.  In both eyes.  This started 4 months ago.  I, the attending physician,  performed the HPI with the patient and updated documentation appropriately.        Comments   Patient here for 4 months retina follow up for PDR OS. Patient states vision doing the same. No eye pain. Started dialysis 2 weeks ago T, TH, and Sat.       Last edited by [name], MD on 04/14/2022  9:29 AM.     Referring physician: Bernerd Limbo, MD [place] [address] [place] [address] [place],  [place] [phone]  HISTORICAL INFORMATION:   Selected notes from the MEDICAL RECORD NUMBER Referred by Dr. [name] for concern of retinal traction LEE: 09.25.19 (K. Hallahan) [BCVA: OD: CF_0  OS: 20/40] Ocular Hx-vitreous hemorrhage OS, traction detachment OD, PDR OU Previous eye docs: [name] Last visit w/ [name] was in 2016 -- was supposed to schedule surgery OS (PPV, MP, EL) but pt never followed up PMH-DM (last A1C: 11.1, takes humalog, lantus), HTN   CURRENT MEDICATIONS: No current outpatient medications on file. (Ophthalmic Drugs)   No current facility-administered medications for this visit. (Ophthalmic Drugs)   Current Outpatient Medications (Other)  Medication Sig   amLODipine (NORVASC) 10 MG tablet Take 10 mg by mouth every morning.   ammonium lactate (LAC-HYDRIN) 12 % lotion Apply 1 application topically daily as needed for dry skin.   aspirin EC 81 MG EC tablet Take 1 tablet (81 mg total) by mouth daily. Swallow whole. (Patient taking differently: Take 81 mg by mouth every morning. Swallow whole.)   atorvastatin (LIPITOR) 80 MG tablet Take 1 tablet (80 mg total) by mouth daily.  (Patient taking differently: Take 80 mg by mouth every morning.)   Blood Glucose Monitoring Suppl ([place]) w/Device KIT by Does not apply route.   cholecalciferol (VITAMIN D3) 25 MCG (1000 UNIT) tablet Take 1,000 Units by mouth every morning.   clopidogrel (PLAVIX) 75 MG tablet Take 75 mg by mouth every morning.   Continuous Blood Gluc Receiver (FREESTYLE LIBRE 2 READER) DEVI by Does not apply route.   Continuous Blood Gluc Sensor (FREESTYLE LIBRE 2 SENSOR) MISC by Does not apply route.   epoetin alfa-epbx (RETACRIT) 78469 UNIT/ML injection Inject 10,000 Units into the vein every 30 (thirty) days.   hydrALAZINE (APRESOLINE) 50 MG tablet Take 1 tablet (50 mg total) by mouth every 8 (eight) hours.   insulin degludec (TRESIBA) 100 UNIT/ML FlexTouch Pen Inject 15 Units into the skin at bedtime.   insulin lispro (HUMALOG KWIKPEN) 200 UNIT/ML KwikPen Inject 4 Units into the skin in the morning, at noon, and at bedtime.   Insulin Pen Needle 32G X 4 MM MISC Use with lantus and humalog 4 imes per day   labetalol (NORMODYNE) 200 MG tablet Take 1 tablet (200 mg total) by mouth 2 (two) times daily.   ondansetron (ZOFRAN-ODT) 4 MG disintegrating tablet Take 1 tablet (4 mg total) by mouth every 8 (eight) hours as needed for nausea or vomiting.  ONE TOUCH LANCETS MISC Use to check blood sugar 8 time(s) daily   ONETOUCH VERIO test strip SMARTSIG:Via Meter 8 Times Daily   Glucagon (BAQSIMI ONE PACK) 3 MG/DOSE POWD Place 3 mg into the nose once as needed (low blood sugar). (Patient not taking: Reported on 01/13/2022)   No current facility-administered medications for this visit. (Other)   REVIEW OF SYSTEMS: ROS   Positive for: Genitourinary, Endocrine, Eyes Negative for: Constitutional, Gastrointestinal, Neurological, Skin, Musculoskeletal, HENT, Cardiovascular, Respiratory, Psychiatric, Allergic/Imm, Heme/Lymph Last edited by [name], COA on 04/12/2022  9:26 AM.      ALLERGIES Allergies  Allergen Reactions   Fexofenadine Rash   Latex Rash   PAST MEDICAL HISTORY Past Medical History:  Diagnosis Date   Benign hypertension with chronic kidney disease, stage III ([place]) 03/10/2019   Chronic kidney disease (CKD), stage III (moderate) ([place]) 03/10/2019   Diabetes mellitus type 1, uncomplicated ([place]) 04/05/6432   Diabetes mellitus without complication ([place])    Diabetic retinopathy ([place])    PDR OU   Heat Injury 03/10/2019   Hypercholesterolemia 03/10/2019   Hypertension    Retinal detachment    TRD OD   Past Surgical History:  Procedure Laterality Date   AV FISTULA PLACEMENT Right 11/08/2020   Procedure: RIGHT ARM BRACHIOCEPHALIC ARTERIOVENOUS (AV) FISTULA CREATION;  Surgeon: Angelia Mould, MD;  Location: [facility] OR;  Service: Vascular;  Laterality: Right;   CATARACT EXTRACTION     EYE SURGERY     RETINAL DETACHMENT SURGERY     FAMILY HISTORY Family History  Problem Relation Age of Onset   Diabetes Maternal Grandmother    Diabetes Sister    SOCIAL HISTORY Social History   Tobacco Use   Smoking status: Never   Smokeless tobacco: Never  Vaping Use   Vaping Use: Never used  Substance Use Topics   Alcohol use: Yes    Comment: occ   Drug use: No       OPHTHALMIC EXAM:  Base Eye Exam     Visual Acuity (Snellen - Linear)       Right Left   Dist cc 20/250 -2 20/25 -1   Dist ph cc NI NI    Correction: Glasses         Tonometry (Tonopen, 9:22 AM)       Right Left   Pressure 12 11         Pupils       Dark Light Shape React APD   Right 3 2 Round Minimal None   Left 3 2 Round Brisk None         Visual Fields (Counting fingers)       Left Right    Full Full         Extraocular Movement       Right Left    Full, Ortho Full, Ortho         Neuro/Psych     Oriented x3: Yes   Mood/Affect: Normal         Dilation     Both eyes: 1.0% Mydriacyl, 2.5% Phenylephrine @ 9:22 AM           Slit Lamp and  Fundus Exam     Slit Lamp Exam       Right Left   Lids/Lashes Dermatochalasis - upper lid Dermatochalasis - upper lid, Meibomian gland dysfunction   Conjunctiva/Sclera Melanosis, temporal pinguecula Melanosis   Cornea Arcus Arcus, trace PEE   Anterior Chamber deep and clear Deep  and quiet   Iris Round and dilated, No NVI Round and dilated, No NVI   Lens 2-3+ Nuclear sclerosis with early brunescence, 2-3+ Cortical cataract 2-3+ Nuclear sclerosis with early brunescence, 3+ Cortical cataract   Anterior Vitreous Vitreous syneresis VH improved centrally, boat shaped heme below inferior arcade improved, mild blood clots inferiorly         Fundus Exam       Right Left   Disc 1-2+ Pallor, fibrosis eminating to ST arcade, Sharp rim sharp rim, mild pallor, mild PPA / PPP   C/D Ratio 0.0 0.4   Macula TRD with vertical fibrotic band extending from ST to IT arcades, tractional fibrosis -- stable from prior Flat, blunted foveal reflex, +edema superior macula, RPE mottling and clumping   Vessels Severe Vascular attenuation, sclerotic arterioles, +fibrotic NV along temporal arcades - inactive Vascular attenuation, early arteriolar sclerosis ST quad, fibrosis along temporal arcades, fibrotic NVE along distal IT arcade -- regressed   Periphery Attached peripherally with 360 PRP laser, No heme Attached, tractional retinoschisis along superior arcades; 360 PRP with good posterior fill in superiorly towards schisis, room for fill in           Refraction     Wearing Rx       Sphere Cylinder Axis Add   Right -0.50 +0.75 045 +1.75   Left -3.00 +1.25 155 +1.75           IMAGING AND PROCEDURES  Imaging and Procedures for _0 @  OCT, Retina - OU - Both Eyes       Right Eye Quality was good. Central Foveal Thickness: 161 (Unable to obtain). Progression has been stable. 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   Left Eye Quality was good. Central Foveal Thickness: 214. Progression has been stable. Findings include no SRF, abnormal foveal contour, intraretinal fluid, vitreous traction (Persistent, stable tractional schisis superiorly, +vitreous opacities).   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 CME - cystoid macular edema. PED - pigment epithelial detachment. IRF - intraretinal fluid. SRF - subretinal fluid. EZ - ellipsoid zone. ERM - epiretinal membrane. ORA - outer retinal atrophy. ORT - outer retinal tubulation. SRHM - subretinal hyper-reflective material             ASSESSMENT/PLAN:    ICD-10-CM   1. 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 Right eye affected by proliferative diabetic retinopathy with traction retinal detachment involving macula, associated with type 2 diabetes mellitus ([place])  J88.4166 OCT, Retina - OU - Both Eyes    3. Essential hypertension  I10     4. Hypertensive retinopathy of both eyes  H35.033     5. Combined forms of age-related cataract of both eyes  H25.813      1,2. Proliferative diabetic retinopathy OU  - A1C: 9.2 on 02.16.23; 9.3 on 07.14.22, 8.9 on 04.20.22  - pt lost to f/u here from 02.12.2020 to 01.27.22  - OD -- chronic, macula-involving TRD -- BCVA 20/150 from 20/400 -- severe retinal ischemia  - OS -- vitreous traction with  macular edema / retinoschisis of superior macula -- BCVA 20/20 stable  - former pt of Dr. Zadie Rhine in 2016 -- records reviewed, at that time, OD was already detached  with VA 20/400, OS was to undergo PPV/MP/EL, but pt never had the surgery or followed up with [name]  - history of new onset vitreous hemorrhage following prior laser  - S/P PRP fill-in OS (10.01.19), fill-in (01.13.20)  - FA (01.13.20) shows active NVE OS, OD with minimal leakage   - VA 20/25 decreased from 20/20 OS  - OCT shows chronic mac off TRD OD, and OS with tractional retinoschisis / macular edema of superior macula -- stable  - repeat FA 02.25.22 shows interval improvement in NVE OS -- minimal leakage along tractional schisis superior macula OS  - discussed possible need for sx in the future  - OS may eventually need PPV / MP to relieve tractional retinoschisis, but with VA 20/25, will monitor for now  - F/U 6 months, sooner prn -- DFE, OCT  2. Chronic TRD OD-   - fovea involving chronic TRD OD -- periphery attached by laser PRP  - severe ischemia / arteriolar sclerosis OD -- low vision potential  - review of records for [name] indicate mac off TRD has been present since early 2016 -- now 3+ years  - discussed findings and poor prognosis  - do not recommend surgical intervention OD -- high risk, low benefit  - pt not interested in surgical intervention at this time  - monitor  3,4. Hypertensive retinopathy OU  - discussed importance of tight BP control  - monitor  5. Combined form age related cataracts OU  - The symptoms of cataract, surgical options, and treatments and risks were discussed with patient.  - discussed diagnosis and progression  - approaching visual significance   - patient has an appt with Dr. Lucianne Lei in October  [place] Ordered this visit:  No orders of the defined types were placed in this encounter.    Return in about 6 months (around 10/13/2022) for f/u PDR OU, DFE, OCT.  There are no Patient Instructions on file for this visit.  This document serves as a record of services personally performed by Gardiner Sleeper, MD, PhD. It was created on their  behalf by Orvan Falconer, an ophthalmic technician. The creation of this record is the provider's dictation and/or activities during the visit.    Electronically signed by: Orvan Falconer, OA, 04/14/22  1:52 PM  This document serves as a record of services personally performed by Gardiner Sleeper, MD, PhD. It was created on their behalf by San Jetty. Owens Shark, OA an ophthalmic technician. The creation of this record is the provider's dictation and/or activities during the visit.    Electronically signed by: San Jetty. Owens Shark, [place] 07.12.2023 1:52 PM  Gardiner Sleeper, M.D., Ph.D. Diseases & Surgery of the Retina and Vitreous [facility]  I have reviewed the above documentation for accuracy and completeness, and I agree with the above. Gardiner Sleeper, M.D., Ph.D. 04/14/22 1:54 PM  Abbreviations: M myopia (nearsighted); A astigmatism; H hyperopia (farsighted); P presbyopia; Mrx spectacle prescription;  CTL contact lenses; OD right eye; OS left eye; OU both eyes  XT exotropia; ET esotropia; PEK punctate epithelial keratitis; PEE punctate epithelial erosions; DES dry eye syndrome; MGD meibomian gland dysfunction; ATs artificial tears; PFAT's preservative free artificial tears; [place] nuclear sclerotic cataract; PSC posterior subcapsular cataract; ERM epi-retinal membrane; PVD posterior vitreous detachment;  RD retinal detachment; DM diabetes mellitus; DR diabetic retinopathy; NPDR non-proliferative diabetic retinopathy; PDR proliferative diabetic retinopathy; CSME clinically significant macular edema; DME diabetic macular edema; dbh dot blot hemorrhages; CWS cotton wool spot; POAG primary open angle glaucoma; C/D cup-to-disc ratio; HVF humphrey visual field; GVF goldmann visual field; OCT optical coherence tomography; IOP intraocular pressure; BRVO Branch retinal vein occlusion; CRVO central retinal vein occlusion; CRAO central retinal artery occlusion; BRAO branch retinal artery occlusion;  RT retinal tear; SB scleral buckle; PPV pars plana vitrectomy; VH Vitreous hemorrhage; PRP panretinal laser photocoagulation; IVK intravitreal kenalog; VMT vitreomacular traction; MH Macular hole;  NVD neovascularization of the disc; NVE neovascularization elsewhere; AREDS age related eye disease study; ARMD age related macular degeneration; POAG primary open angle glaucoma; EBMD epithelial/anterior basement membrane dystrophy; ACIOL anterior chamber intraocular lens; IOL intraocular lens; PCIOL posterior chamber intraocular lens; Phaco/IOL phacoemulsification with intraocular lens placement; [place] photorefractive keratectomy; LASIK laser assisted in situ keratomileusis; HTN hypertension; DM diabetes mellitus; COPD chronic obstructive pulmonary disease

## 2022-04-12 ENCOUNTER — Encounter (INDEPENDENT_AMBULATORY_CARE_PROVIDER_SITE_OTHER): Payer: Self-pay | Admitting: Ophthalmology

## 2022-04-12 ENCOUNTER — Ambulatory Visit (INDEPENDENT_AMBULATORY_CARE_PROVIDER_SITE_OTHER): Payer: Medicare Other | Admitting: Ophthalmology

## 2022-04-12 DIAGNOSIS — H25813 Combined forms of age-related cataract, bilateral: Secondary | ICD-10-CM

## 2022-04-12 DIAGNOSIS — E113521 Type 2 diabetes mellitus with proliferative diabetic retinopathy with traction retinal detachment involving the macula, right eye: Secondary | ICD-10-CM | POA: Diagnosis not present

## 2022-04-12 DIAGNOSIS — H35033 Hypertensive retinopathy, bilateral: Secondary | ICD-10-CM | POA: Diagnosis not present

## 2022-04-12 DIAGNOSIS — E113512 Type 2 diabetes mellitus with proliferative diabetic retinopathy with macular edema, left eye: Secondary | ICD-10-CM | POA: Diagnosis not present

## 2022-04-12 DIAGNOSIS — I10 Essential (primary) hypertension: Secondary | ICD-10-CM | POA: Diagnosis not present

## 2022-04-14 ENCOUNTER — Encounter (INDEPENDENT_AMBULATORY_CARE_PROVIDER_SITE_OTHER): Payer: Self-pay | Admitting: Ophthalmology

## 2022-04-27 ENCOUNTER — Ambulatory Visit: Payer: Medicare Other | Admitting: Cardiology

## 2022-04-29 ENCOUNTER — Other Ambulatory Visit: Payer: Self-pay

## 2022-04-29 ENCOUNTER — Emergency Department (HOSPITAL_COMMUNITY)
Admission: EM | Admit: 2022-04-29 | Discharge: 2022-04-29 | Disposition: A | Discharge status: Home / Self Care | Payer: Medicare Other | Attending: Emergency Medicine | Admitting: Emergency Medicine

## 2022-04-29 DIAGNOSIS — E1022 Type 1 diabetes mellitus with diabetic chronic kidney disease: Secondary | ICD-10-CM | POA: Diagnosis not present

## 2022-04-29 DIAGNOSIS — Z7902 Long term (current) use of antithrombotics/antiplatelets: Secondary | ICD-10-CM | POA: Diagnosis not present

## 2022-04-29 DIAGNOSIS — E162 Hypoglycemia, unspecified: Secondary | ICD-10-CM

## 2022-04-29 DIAGNOSIS — E10649 Type 1 diabetes mellitus with hypoglycemia without coma: Secondary | ICD-10-CM | POA: Insufficient documentation

## 2022-04-29 DIAGNOSIS — Z7982 Long term (current) use of aspirin: Secondary | ICD-10-CM | POA: Insufficient documentation

## 2022-04-29 DIAGNOSIS — I120 Hypertensive chronic kidney disease with stage 5 chronic kidney disease or end stage renal disease: Secondary | ICD-10-CM | POA: Insufficient documentation

## 2022-04-29 DIAGNOSIS — Z91040 Latex allergy status: Secondary | ICD-10-CM | POA: Diagnosis not present

## 2022-04-29 DIAGNOSIS — Z79899 Other long term (current) drug therapy: Secondary | ICD-10-CM | POA: Insufficient documentation

## 2022-04-29 DIAGNOSIS — Z992 Dependence on renal dialysis: Secondary | ICD-10-CM | POA: Insufficient documentation

## 2022-04-29 DIAGNOSIS — N186 End stage renal disease: Secondary | ICD-10-CM | POA: Diagnosis not present

## 2022-04-29 DIAGNOSIS — Z794 Long term (current) use of insulin: Secondary | ICD-10-CM | POA: Insufficient documentation

## 2022-04-29 LAB — COMPREHENSIVE METABOLIC PANEL
ALT: 46 U/L — ABNORMAL HIGH (ref 0–44)
AST: 51 U/L — ABNORMAL HIGH (ref 15–41)
Albumin: 4.2 g/dL (ref 3.5–5.0)
Alkaline Phosphatase: 92 U/L (ref 38–126)
Anion gap: 15 (ref 5–15)
BUN: 35 mg/dL — ABNORMAL HIGH (ref 6–20)
CO2: 24 mmol/L (ref 22–32)
Calcium: 10 mg/dL (ref 8.9–10.3)
Chloride: 102 mmol/L (ref 98–111)
Creatinine, Ser: 6.9 mg/dL — ABNORMAL HIGH (ref 0.61–1.24)
GFR, Estimated: 9 mL/min — ABNORMAL LOW (ref >60)
Glucose, Bld: 107 mg/dL — ABNORMAL HIGH (ref 70–99)
Potassium: 4.4 mmol/L (ref 3.5–5.1)
Sodium: 141 mmol/L (ref 135–145)
Total Bilirubin: 0.6 mg/dL (ref 0.3–1.2)
Total Protein: 7.3 g/dL (ref 6.5–8.1)

## 2022-04-29 LAB — CBG MONITORING, ED
Glucose-Capillary: 101 mg/dL — ABNORMAL HIGH (ref 70–99)
Glucose-Capillary: 129 mg/dL — ABNORMAL HIGH (ref 70–99)
Glucose-Capillary: 210 mg/dL — ABNORMAL HIGH (ref 70–99)
Glucose-Capillary: 234 mg/dL — ABNORMAL HIGH (ref 70–99)
Glucose-Capillary: 305 mg/dL — ABNORMAL HIGH (ref 70–99)
Glucose-Capillary: 457 mg/dL — ABNORMAL HIGH (ref 70–99)

## 2022-04-29 LAB — CBC
HCT: 48.5 % (ref 39.0–52.0)
Hemoglobin: 15.3 g/dL (ref 13.0–17.0)
MCH: 29 pg (ref 26.0–34.0)
MCHC: 31.5 g/dL (ref 30.0–36.0)
MCV: 91.9 fL (ref 80.0–100.0)
Platelets: 279 10*3/uL (ref 150–400)
RBC: 5.28 MIL/uL (ref 4.22–5.81)
RDW: 14.6 % (ref 11.5–15.5)
WBC: 7.1 10*3/uL (ref 4.0–10.5)
nRBC: 0 % (ref 0.0–0.2)

## 2022-04-29 MED ORDER — INSULIN ASPART 100 UNIT/ML IJ SOLN
5.0000 [IU] | Freq: Once | INTRAMUSCULAR | Status: AC
  Administered 2022-04-29: 5 [IU] via SUBCUTANEOUS

## 2022-04-29 NOTE — ED Notes (Signed)
The pt is in no acute distress  he is reading

## 2022-04-29 NOTE — ED Notes (Signed)
Behavorial pa at the bedside approx 30 minutes ago  the report from off going  personnell was that  he was still in his clothes because he was un-cooperqative and violent before he was medicated around 0800am today

## 2022-04-29 NOTE — ED Notes (Signed)
Checked patient cbg it was 457 notified the provider

## 2022-04-29 NOTE — ED Notes (Signed)
Pt given another sandwich and two more cups of juice as CBG is continuing to drop. Patient has not eaten anything since last night.

## 2022-04-29 NOTE — ED Triage Notes (Addendum)
Pt found unresponsive at home with CBG of 33. Hx DM. EMS gave 25g D10 with improvement to 166. A/O x 4 at triage. Had not eaten or drunk anything today prior to EMS arrival. CBG 129 during triage. Provided with [place] sandwich bag and encouraged to eat in triage. T, Th, S dialysis patient, missed his tx this morning. Dialysis center called his wife (who is out of town) because patient was not at his scheduled appointment, and she sent people to the house to check on him, and they found him unresponsive.

## 2022-04-29 NOTE — Discharge Instructions (Addendum)
Do not take your dose of tresiba tonight.  Call your dialysis center for dialysis on Monday.  It was a pleasure caring for you today!  Resume your triseba tomorrow at your normal prescribed dose.

## 2022-04-29 NOTE — ED Notes (Signed)
[name] [phone] would like an update asap

## 2022-04-29 NOTE — ED Provider Notes (Signed)
[facility] EMERGENCY DEPARTMENT Provider Note   CSN: [id number] Arrival date & time: 04/29/22  1335     History {Add pertinent medical, surgical, social history, OB history to HPI:1} Chief Complaint  Patient presents with   Hypoglycemia    Louis Peters is a 56 y.o. male.  HPI      56yo male with history of ESRD on dialysis Tuesday, Thursday, Saturday, DM type 1, hypertension, hyperlipidemia, , who presents with concern for hypoglycemia.   CBG was 33.  EMS gave 25 g of D10 with improvement to 166.  Did not have anything to eat or drink prior to EMS arrival.  He missed dialysis treatment today.  On insulin degludec 15U at bedtime Lispro 5U morning, noon, bedtime (increased from 4?)   Past Medical History:  Diagnosis Date   Benign hypertension with chronic kidney disease, stage III ([place]) 03/10/2019   Chronic kidney disease (CKD), stage III (moderate) ([place]) 03/10/2019   Diabetes mellitus type 1, uncomplicated ([place]) 01/06/4258   Diabetes mellitus without complication ([place])    Diabetic retinopathy ([place])    PDR OU   Heat Injury 03/10/2019   Hypercholesterolemia 03/10/2019   Hypertension    Retinal detachment    TRD OD    Home Medications Prior to Admission medications   Medication Sig Start Date End Date Taking? Authorizing Provider  amLODipine (NORVASC) 10 MG tablet Take 10 mg by mouth every morning. 01/16/17   [provider]  ammonium lactate (LAC-HYDRIN) 12 % lotion Apply 1 application topically daily as needed for dry skin. 10/01/18   [provider]  aspirin EC 81 MG EC tablet Take 1 tablet (81 mg total) by mouth daily. Swallow whole. Patient taking differently: Take 81 mg by mouth every morning. Swallow whole. 07/24/20   Geradine Girt, DO  atorvastatin (LIPITOR) 80 MG tablet Take 1 tablet (80 mg total) by mouth daily. Patient taking differently: Take 80 mg by mouth every morning. 07/24/20   Geradine Girt, DO  Blood Glucose  Monitoring Suppl ([place]) w/Device KIT by Does not apply route. 07/31/18   [provider]  cholecalciferol (VITAMIN D3) 25 MCG (1000 UNIT) tablet Take 1,000 Units by mouth every morning.    [provider]  clopidogrel (PLAVIX) 75 MG tablet Take 75 mg by mouth every morning. 11/28/20   [provider]  Continuous Blood Gluc Receiver (FREESTYLE LIBRE 2 READER) DEVI by Does not apply route. 04/08/20   [provider]  Continuous Blood Gluc Sensor (FREESTYLE LIBRE 2 SENSOR) MISC by Does not apply route. 04/14/21   [provider]  epoetin alfa-epbx (RETACRIT) 56387 UNIT/ML injection Inject 10,000 Units into the vein every 30 (thirty) days.    [provider]  Glucagon (BAQSIMI ONE PACK) 3 MG/DOSE POWD Place 3 mg into the nose once as needed (low blood sugar). Patient not taking: Reported on 01/13/2022    [provider]  hydrALAZINE (APRESOLINE) 50 MG tablet Take 1 tablet (50 mg total) by mouth every 8 (eight) hours. 02/21/22   Little Ishikawa, MD  insulin degludec (TRESIBA) 100 UNIT/ML FlexTouch Pen Inject 15 Units into the skin at bedtime. 02/21/22   Little Ishikawa, MD  insulin lispro (HUMALOG KWIKPEN) 200 UNIT/ML KwikPen Inject 4 Units into the skin in the morning, at noon, and at bedtime.    [provider]  Insulin Pen Needle 32G X 4 MM MISC Use with lantus and humalog 4 imes per day  02/21/22   Little Ishikawa, MD  labetalol (NORMODYNE) 200 MG tablet Take 1 tablet (200 mg total) by mouth 2 (two) times daily. 02/21/22   Little Ishikawa, MD  ondansetron (ZOFRAN-ODT) 4 MG disintegrating tablet Take 1 tablet (4 mg total) by mouth every 8 (eight) hours as needed for nausea or vomiting. 10/01/21   Fredia Sorrow, MD  ONE [facility] LANCETS MISC Use to check blood sugar 8 time(s) daily 02/21/22   Little Ishikawa, MD  [facility] VERIO test strip SMARTSIG:Via Meter 8 Times Daily 12/04/20   [provider]      Allergies    Fexofenadine and Latex    Review of Systems   Review of Systems  Physical Exam Updated Vital Signs BP (!) 171/83 (BP Location: Left Arm)   Pulse 70   Resp 18   SpO2 100%  Physical Exam  ED Results / Procedures / Treatments   Labs (all labs ordered are listed, but only abnormal results are displayed) Labs Reviewed  COMPREHENSIVE METABOLIC PANEL - Abnormal; Notable for the following components:      Result Value   Glucose, Bld 107 (*)    BUN 35 (*)    Creatinine, Ser 6.90 (*)    AST 51 (*)    ALT 46 (*)    GFR, Estimated 9 (*)    All other components within normal limits  CBG MONITORING, ED - Abnormal; Notable for the following components:   Glucose-Capillary 129 (*)    All other components within normal limits  CBG MONITORING, ED - Abnormal; Notable for the following components:   Glucose-Capillary 101 (*)    All other components within normal limits  CBC  CBG MONITORING, ED    EKG None  Radiology No results found.  Procedures Procedures  {Document cardiac monitor, telemetry assessment procedure when appropriate:1}  Medications Ordered in ED Medications - No data to display  ED Course/ Medical Decision Making/ A&P                           Medical Decision Making Amount and/or Complexity of Data Reviewed Labs: ordered.   ***  {Document critical care time when appropriate:1} {Document review of labs and clinical decision tools ie heart score, Chads2Vasc2 etc:1}  {Document your independent review of radiology images, and any outside records:1} {Document your discussion with family members, caretakers, and with consultants:1} {Document social determinants of health affecting pt's care:1} {Document your decision making why or why not admission, treatments were needed:1} Final Clinical Impression(s) / ED Diagnoses Final diagnoses:  None    Rx / DC Orders ED Discharge Orders     None

## 2022-05-08 ENCOUNTER — Ambulatory Visit: Payer: Medicare Other | Admitting: Cardiology

## 2022-05-30 ENCOUNTER — Emergency Department (HOSPITAL_COMMUNITY)
Admission: EM | Admit: 2022-05-30 | Discharge: 2022-05-30 | Disposition: A | Discharge status: Home / Self Care | Payer: Medicare Other | Attending: Emergency Medicine | Admitting: Emergency Medicine

## 2022-05-30 ENCOUNTER — Other Ambulatory Visit: Payer: Self-pay

## 2022-05-30 DIAGNOSIS — E1022 Type 1 diabetes mellitus with diabetic chronic kidney disease: Secondary | ICD-10-CM | POA: Insufficient documentation

## 2022-05-30 DIAGNOSIS — Z79899 Other long term (current) drug therapy: Secondary | ICD-10-CM | POA: Diagnosis not present

## 2022-05-30 DIAGNOSIS — I120 Hypertensive chronic kidney disease with stage 5 chronic kidney disease or end stage renal disease: Secondary | ICD-10-CM | POA: Diagnosis not present

## 2022-05-30 DIAGNOSIS — Z7902 Long term (current) use of antithrombotics/antiplatelets: Secondary | ICD-10-CM | POA: Insufficient documentation

## 2022-05-30 DIAGNOSIS — Z7982 Long term (current) use of aspirin: Secondary | ICD-10-CM | POA: Diagnosis not present

## 2022-05-30 DIAGNOSIS — K920 Hematemesis: Secondary | ICD-10-CM | POA: Diagnosis not present

## 2022-05-30 DIAGNOSIS — Z91040 Latex allergy status: Secondary | ICD-10-CM | POA: Diagnosis not present

## 2022-05-30 DIAGNOSIS — R112 Nausea with vomiting, unspecified: Secondary | ICD-10-CM

## 2022-05-30 DIAGNOSIS — Z794 Long term (current) use of insulin: Secondary | ICD-10-CM | POA: Insufficient documentation

## 2022-05-30 DIAGNOSIS — N186 End stage renal disease: Secondary | ICD-10-CM | POA: Insufficient documentation

## 2022-05-30 DIAGNOSIS — Z20822 Contact with and (suspected) exposure to covid-19: Secondary | ICD-10-CM | POA: Insufficient documentation

## 2022-05-30 DIAGNOSIS — Z992 Dependence on renal dialysis: Secondary | ICD-10-CM | POA: Insufficient documentation

## 2022-05-30 LAB — CBC
HCT: 38.7 % — ABNORMAL LOW (ref 39.0–52.0)
Hemoglobin: 12.5 g/dL — ABNORMAL LOW (ref 13.0–17.0)
MCH: 28.2 pg (ref 26.0–34.0)
MCHC: 32.3 g/dL (ref 30.0–36.0)
MCV: 87.2 fL (ref 80.0–100.0)
Platelets: 204 10*3/uL (ref 150–400)
RBC: 4.44 MIL/uL (ref 4.22–5.81)
RDW: 14 % (ref 11.5–15.5)
WBC: 6.5 10*3/uL (ref 4.0–10.5)
nRBC: 0 % (ref 0.0–0.2)

## 2022-05-30 LAB — COMPREHENSIVE METABOLIC PANEL
ALT: 68 U/L — ABNORMAL HIGH (ref 0–44)
AST: 53 U/L — ABNORMAL HIGH (ref 15–41)
Albumin: 4 g/dL (ref 3.5–5.0)
Alkaline Phosphatase: 102 U/L (ref 38–126)
Anion gap: 10 (ref 5–15)
BUN: 23 mg/dL — ABNORMAL HIGH (ref 6–20)
CO2: 32 mmol/L (ref 22–32)
Calcium: 8.7 mg/dL — ABNORMAL LOW (ref 8.9–10.3)
Chloride: 98 mmol/L (ref 98–111)
Creatinine, Ser: 4.63 mg/dL — ABNORMAL HIGH (ref 0.61–1.24)
GFR, Estimated: 14 mL/min — ABNORMAL LOW (ref >60)
Glucose, Bld: 158 mg/dL — ABNORMAL HIGH (ref 70–99)
Potassium: 3.5 mmol/L (ref 3.5–5.1)
Sodium: 140 mmol/L (ref 135–145)
Total Bilirubin: 0.4 mg/dL (ref 0.3–1.2)
Total Protein: 7.2 g/dL (ref 6.5–8.1)

## 2022-05-30 LAB — TYPE AND SCREEN
ABO/RH(D): O POS
Antibody Screen: NEGATIVE

## 2022-05-30 LAB — SARS CORONAVIRUS 2 BY RT PCR: SARS Coronavirus 2 by RT PCR: NEGATIVE

## 2022-05-30 LAB — ABO/RH: ABO/RH(D): O POS

## 2022-05-30 MED ORDER — ONDANSETRON 4 MG PO TBDP: 4.0000 mg | ORAL_TABLET | Freq: Three times a day (TID) | ORAL | 0 refills | Status: DC | PRN

## 2022-05-30 MED ORDER — PANTOPRAZOLE SODIUM 40 MG PO TBEC: 40.0000 mg | DELAYED_RELEASE_TABLET | Freq: Every day | ORAL | 0 refills | Status: DC

## 2022-05-30 NOTE — ED Triage Notes (Signed)
Pt BIBGEMS coming from dialysis for positive blood in emesis. Pt has no complaints.

## 2022-06-02 NOTE — ED Provider Notes (Signed)
[facility] EMERGENCY DEPARTMENT Provider Note   CSN: [id number] Arrival date & time: 05/30/22  1555     History  Chief Complaint  Patient presents with   Hematemesis    + Occult testing at Dialysis    SAIQUAN HANDS is a 56 y.o. male.  HPI      56yo male with history of ESRD on dialysis Tuesday, Thursday, Saturday, DM type I, htn, hlpd, who presents with concern for nausea, vomiting, with hematemesis while at dialysis.  Has never had nausea, vomiting with dialysis before. Today, began to feel nauseas and have vomiting while on dialysis.   Reports initially while he was vomiting it was yellow and then after vomiting became darker and they told him he had vomited blood.  Vomited several times while there. No chest pain, no dyspnea, no fever, no cough, no abdominal pain, no diarrhea, no black or bloody stool.      Home Medications Prior to Admission medications   Medication Sig Start Date End Date Taking? Authorizing Provider  ondansetron (ZOFRAN-ODT) 4 MG disintegrating tablet Take 1 tablet (4 mg total) by mouth every 8 (eight) hours as needed for nausea or vomiting. 05/30/22  Yes Gareth Morgan, MD  pantoprazole (PROTONIX) 40 MG tablet Take 1 tablet (40 mg total) by mouth daily for 14 days. 05/30/22 06/13/22 Yes Gareth Morgan, MD  amLODipine (NORVASC) 10 MG tablet Take 10 mg by mouth every morning. 01/16/17   [provider]  ammonium lactate (LAC-HYDRIN) 12 % lotion Apply 1 application topically daily as needed for dry skin. 10/01/18   [provider]  aspirin EC 81 MG EC tablet Take 1 tablet (81 mg total) by mouth daily. Swallow whole. Patient taking differently: Take 81 mg by mouth every morning. Swallow whole. 07/24/20   Geradine Girt, DO  atorvastatin (LIPITOR) 80 MG tablet Take 1 tablet (80 mg total) by mouth daily. Patient taking differently: Take 80 mg by mouth every morning. 07/24/20   Geradine Girt, DO  Blood Glucose  Monitoring Suppl ([place]) w/Device KIT by Does not apply route. 07/31/18   [provider]  Cholecalciferol (VITAMIN D) 50 MCG (2000 UT) tablet Take 2,000 Units by mouth daily. 03/17/22   [provider]  clopidogrel (PLAVIX) 75 MG tablet Take 75 mg by mouth every morning. 11/28/20   [provider]  Continuous Blood Gluc Receiver (FREESTYLE LIBRE 2 READER) DEVI by Does not apply route. 04/08/20   [provider]  Continuous Blood Gluc Sensor (FREESTYLE LIBRE 2 SENSOR) MISC by Does not apply route. 04/14/21   [provider]  epoetin alfa-epbx (RETACRIT) 10175 UNIT/ML injection Inject 10,000 Units into the vein every 30 (thirty) days.    [provider]  Glucagon (BAQSIMI ONE PACK) 3 MG/DOSE POWD Place 3 mg into the nose once as needed (low blood sugar).    [provider]  HUMALOG KWIKPEN 100 UNIT/ML KwikPen Inject 5 Units into the skin 3 (three) times daily. 04/24/22   [provider]  hydrALAZINE (APRESOLINE) 50 MG tablet Take 1 tablet (50 mg total) by mouth every 8 (eight) hours. 02/21/22   Little Ishikawa, MD  hydrochlorothiazide (HYDRODIURIL) 25 MG tablet Take 25 mg by mouth daily. 02/10/22   [provider]  insulin degludec (TRESIBA) 100 UNIT/ML FlexTouch Pen Inject 15 Units into the skin at bedtime. Patient taking differently: Inject 8 Units into the skin at bedtime. 02/21/22   Little Ishikawa, MD  Insulin Pen Needle 32G X 4 MM MISC Use with lantus and humalog 4 imes per day 02/21/22   Little Ishikawa, MD  labetalol (NORMODYNE) 200 MG tablet Take 1 tablet (200 mg total) by mouth 2 (two) times daily. 02/21/22   Little Ishikawa, MD  ondansetron (ZOFRAN) 4 MG tablet Take 4 mg by mouth daily as needed for nausea or vomiting. 03/17/22   [provider]  ONE TOUCH LANCETS MISC Use to check blood sugar 8 time(s) daily 02/21/22   Little Ishikawa, MD  [facility] VERIO test strip  SMARTSIG:Via Meter 8 Times Daily 12/04/20   [provider]  sevelamer carbonate (RENVELA) 800 MG tablet Take 800 mg by mouth 3 (three) times daily. 04/20/22   [provider]      Allergies    Fexofenadine and Latex    Review of Systems   Review of Systems  Physical Exam Updated Vital Signs BP (!) 145/75 (BP Location: Left Arm)   Pulse 72   Temp 98.5 F (36.9 C) (Oral)   Resp 18   SpO2 96%  Physical Exam Vitals and nursing note reviewed.  Constitutional:      General: He is not in acute distress.    Appearance: He is well-developed. He is not diaphoretic.  HENT:     Head: Normocephalic and atraumatic.  Eyes:     Conjunctiva/sclera: Conjunctivae normal.  Cardiovascular:     Rate and Rhythm: Normal rate and regular rhythm.     Heart sounds: Normal heart sounds. No murmur heard.    No friction rub. No gallop.  Pulmonary:     Effort: Pulmonary effort is normal. No respiratory distress.     Breath sounds: Normal breath sounds. No wheezing or rales.  Abdominal:     General: There is no distension.     Palpations: Abdomen is soft.     Tenderness: There is no abdominal tenderness. There is no guarding.  Musculoskeletal:     Cervical back: Normal range of motion.  Skin:    General: Skin is warm and dry.  Neurological:     Mental Status: He is alert and oriented to person, place, and time.     ED Results / Procedures / Treatments   Labs (all labs ordered are listed, but only abnormal results are displayed) Labs Reviewed  CBC - Abnormal; Notable for the following components:      Result Value   Hemoglobin 12.5 (*)    HCT 38.7 (*)    All other components within normal limits  COMPREHENSIVE METABOLIC PANEL - Abnormal; Notable for the following components:   Glucose, Bld 158 (*)    BUN 23 (*)    Creatinine, Ser 4.63 (*)    Calcium 8.7 (*)    AST 53 (*)    ALT 68 (*)    GFR, Estimated 14 (*)    All other components within normal limits  SARS  CORONAVIRUS 2 BY RT PCR  TYPE AND SCREEN  ABO/RH    EKG None  Radiology No results found.  Procedures Procedures    Medications Ordered in ED Medications - No data to display  ED Course/ Medical Decision Making/ A&P                            56yo male with history of ESRD on dialysis Tuesday, Thursday, Saturday, DM type I, htn, hlpd, who presents with concern for nausea, vomiting, with hematemesis  while at dialysis.    Denies chest pain, dyspnea, abdominal pain, fever, continued symptoms at this time and doubt surgical abdominal etiology, SBO, ACS, dissection.  Labs completed and personally evaluated by me.  Hgb 12.5, was 15 previously however 9.4 prior to that.  He is hemodynamically stable over 3 hours of observation without continued symptoms hgb 12.5, and reports emesis prior to hematemesis and have low suspicion for clinically significant GI bleed to require admission.  COVID test ordered. Possible other viral etiology gastritis. Recommend continued monitoring of symptoms.  Given rx for protonix and zofran. Recommend strict return precautions. Patient discharged in stable condition with understanding of reasons to return.            Final Clinical Impression(s) / ED Diagnoses Final diagnoses:  Nausea and vomiting, unspecified vomiting type  Hematemesis with nausea    Rx / DC Orders ED Discharge Orders          Ordered    pantoprazole (PROTONIX) 40 MG tablet  Daily        05/30/22 1858    ondansetron (ZOFRAN-ODT) 4 MG disintegrating tablet  Every 8 hours PRN        05/30/22 1858              Gareth Morgan, MD 06/02/22 2202

## 2022-06-05 ENCOUNTER — Emergency Department (HOSPITAL_COMMUNITY): Payer: Medicare Other

## 2022-06-05 ENCOUNTER — Encounter (HOSPITAL_COMMUNITY): Payer: Self-pay | Admitting: Emergency Medicine

## 2022-06-05 ENCOUNTER — Other Ambulatory Visit: Payer: Self-pay

## 2022-06-05 ENCOUNTER — Emergency Department (HOSPITAL_COMMUNITY)
Admission: EM | Admit: 2022-06-05 | Discharge: 2022-06-05 | Disposition: A | Discharge status: Home / Self Care | Payer: Medicare Other | Attending: Emergency Medicine | Admitting: Emergency Medicine

## 2022-06-05 DIAGNOSIS — Z79899 Other long term (current) drug therapy: Secondary | ICD-10-CM | POA: Diagnosis not present

## 2022-06-05 DIAGNOSIS — Z992 Dependence on renal dialysis: Secondary | ICD-10-CM | POA: Insufficient documentation

## 2022-06-05 DIAGNOSIS — Z794 Long term (current) use of insulin: Secondary | ICD-10-CM | POA: Diagnosis not present

## 2022-06-05 DIAGNOSIS — Z91040 Latex allergy status: Secondary | ICD-10-CM | POA: Diagnosis not present

## 2022-06-05 DIAGNOSIS — R531 Weakness: Secondary | ICD-10-CM | POA: Insufficient documentation

## 2022-06-05 DIAGNOSIS — Z20822 Contact with and (suspected) exposure to covid-19: Secondary | ICD-10-CM | POA: Diagnosis not present

## 2022-06-05 DIAGNOSIS — N186 End stage renal disease: Secondary | ICD-10-CM | POA: Insufficient documentation

## 2022-06-05 DIAGNOSIS — R55 Syncope and collapse: Secondary | ICD-10-CM | POA: Diagnosis not present

## 2022-06-05 DIAGNOSIS — I120 Hypertensive chronic kidney disease with stage 5 chronic kidney disease or end stage renal disease: Secondary | ICD-10-CM | POA: Diagnosis not present

## 2022-06-05 DIAGNOSIS — Z7982 Long term (current) use of aspirin: Secondary | ICD-10-CM | POA: Diagnosis not present

## 2022-06-05 DIAGNOSIS — E119 Type 2 diabetes mellitus without complications: Secondary | ICD-10-CM | POA: Insufficient documentation

## 2022-06-05 DIAGNOSIS — R42 Dizziness and giddiness: Secondary | ICD-10-CM | POA: Insufficient documentation

## 2022-06-05 LAB — COMPREHENSIVE METABOLIC PANEL
ALT: 27 U/L (ref 0–44)
AST: 27 U/L (ref 15–41)
Albumin: 3.6 g/dL (ref 3.5–5.0)
Alkaline Phosphatase: 87 U/L (ref 38–126)
Anion gap: 16 — ABNORMAL HIGH (ref 5–15)
BUN: 40 mg/dL — ABNORMAL HIGH (ref 6–20)
CO2: 27 mmol/L (ref 22–32)
Calcium: 9.2 mg/dL (ref 8.9–10.3)
Chloride: 98 mmol/L (ref 98–111)
Creatinine, Ser: 6.66 mg/dL — ABNORMAL HIGH (ref 0.61–1.24)
GFR, Estimated: 9 mL/min — ABNORMAL LOW (ref >60)
Glucose, Bld: 257 mg/dL — ABNORMAL HIGH (ref 70–99)
Potassium: 3.2 mmol/L — ABNORMAL LOW (ref 3.5–5.1)
Sodium: 141 mmol/L (ref 135–145)
Total Bilirubin: 0.8 mg/dL (ref 0.3–1.2)
Total Protein: 6.1 g/dL — ABNORMAL LOW (ref 6.5–8.1)

## 2022-06-05 LAB — CBC WITH DIFFERENTIAL/PLATELET
Abs Immature Granulocytes: 0.01 10*3/uL (ref 0.00–0.07)
Basophils Absolute: 0.1 10*3/uL (ref 0.0–0.1)
Basophils Relative: 1 %
Eosinophils Absolute: 0.2 10*3/uL (ref 0.0–0.5)
Eosinophils Relative: 3 %
HCT: 34.4 % — ABNORMAL LOW (ref 39.0–52.0)
Hemoglobin: 11.3 g/dL — ABNORMAL LOW (ref 13.0–17.0)
Immature Granulocytes: 0 %
Lymphocytes Relative: 25 %
Lymphs Abs: 1.6 10*3/uL (ref 0.7–4.0)
MCH: 28.6 pg (ref 26.0–34.0)
MCHC: 32.8 g/dL (ref 30.0–36.0)
MCV: 87.1 fL (ref 80.0–100.0)
Monocytes Absolute: 0.4 10*3/uL (ref 0.1–1.0)
Monocytes Relative: 7 %
Neutro Abs: 4.3 10*3/uL (ref 1.7–7.7)
Neutrophils Relative %: 64 %
Platelets: 208 10*3/uL (ref 150–400)
RBC: 3.95 MIL/uL — ABNORMAL LOW (ref 4.22–5.81)
RDW: 14.2 % (ref 11.5–15.5)
WBC: 6.6 10*3/uL (ref 4.0–10.5)
nRBC: 0 % (ref 0.0–0.2)

## 2022-06-05 LAB — RESP PANEL BY RT-PCR (FLU A&B, COVID) ARPGX2
Influenza A by PCR: NEGATIVE
Influenza B by PCR: NEGATIVE
SARS Coronavirus 2 by RT PCR: NEGATIVE

## 2022-06-05 LAB — TROPONIN I (HIGH SENSITIVITY): Troponin I (High Sensitivity): 22 ng/L — ABNORMAL HIGH (ref <18)

## 2022-06-05 MED ORDER — LACTATED RINGERS IV BOLUS
500.0000 mL | Freq: Once | INTRAVENOUS | Status: AC
  Administered 2022-06-05: 500 mL via INTRAVENOUS

## 2022-06-05 MED ORDER — LACTATED RINGERS IV BOLUS
500.0000 mL | Freq: Once | INTRAVENOUS | Status: AC
Start: 2022-06-05 — End: 2022-06-05
  Administered 2022-06-05: 500 mL via INTRAVENOUS

## 2022-06-05 MED ORDER — SODIUM CHLORIDE 0.9 % IV BOLUS
1000.0000 mL | Freq: Once | INTRAVENOUS | Status: AC
  Administered 2022-06-05: 1000 mL via INTRAVENOUS

## 2022-06-05 NOTE — ED Provider Notes (Signed)
[place] EMERGENCY DEPARTMENT Provider Note   CSN: [id number] Arrival date & time: 06/05/22  1000     History  No chief complaint on file.   Louis Peters is a 56 y.o. male.  HPI Patient presents for generalized weakness.  Medical history includes DM, ESRD (on HD T, TH, SA), HTN, HLD.  He has been on dialysis for the past 2 months.  He has not missed any sessions.  Patient has been doing exertional labor for work, each day for several weeks.  He worked up until 8 PM last night.  This morning, he was going to work.  He had not yet started exerting himself.  When he was walking to his work truck, he felt lightheaded and generalized weakness.  This caused him to fall twice.  He did not lose consciousness.  He denies any areas of pain or suspected injury from the falls.  He denies any associated nausea, chest pain, shortness of breath, headache, or neurologic symptoms.  Since that episode, symptoms have improved.  Patient is currently taking 4 antihypertensive medications.  This includes amlodipine, hydralazine, HCTZ, and labetalol.  He takes them all in the morning.  He took them this morning at 8 AM.  Episode of weakness was at 9 AM.  His wife was recently diagnosed with COVID-19 but he denies any recent URI symptoms.      Home Medications Prior to Admission medications   Medication Sig Start Date End Date Taking? Authorizing Provider  amLODipine (NORVASC) 10 MG tablet Take 10 mg by mouth every morning. 01/16/17   [provider]  ammonium lactate (LAC-HYDRIN) 12 % lotion Apply 1 application topically daily as needed for dry skin. 10/01/18   [provider]  aspirin EC 81 MG EC tablet Take 1 tablet (81 mg total) by mouth daily. Swallow whole. Patient taking differently: Take 81 mg by mouth every morning. Swallow whole. 07/24/20   Geradine Girt, DO  atorvastatin (LIPITOR) 80 MG tablet Take 1 tablet (80 mg total) by mouth daily. Patient taking  differently: Take 80 mg by mouth every morning. 07/24/20   Geradine Girt, DO  Blood Glucose Monitoring Suppl ([place]) w/Device KIT by Does not apply route. 07/31/18   [provider]  Cholecalciferol (VITAMIN D) 50 MCG (2000 UT) tablet Take 2,000 Units by mouth daily. 03/17/22   [provider]  clopidogrel (PLAVIX) 75 MG tablet Take 75 mg by mouth every morning. 11/28/20   [provider]  Continuous Blood Gluc Receiver (FREESTYLE LIBRE 2 READER) DEVI by Does not apply route. 04/08/20   [provider]  Continuous Blood Gluc Sensor (FREESTYLE LIBRE 2 SENSOR) MISC by Does not apply route. 04/14/21   [provider]  epoetin alfa-epbx (RETACRIT) 17408 UNIT/ML injection Inject 10,000 Units into the vein every 30 (thirty) days.    [provider]  Glucagon (BAQSIMI ONE PACK) 3 MG/DOSE POWD Place 3 mg into the nose once as needed (low blood sugar).    [provider]  HUMALOG KWIKPEN 100 UNIT/ML KwikPen Inject 5 Units into the skin 3 (three) times daily. 04/24/22   [provider]  hydrALAZINE (APRESOLINE) 50 MG tablet Take 1 tablet (50 mg total) by mouth every 8 (eight) hours. 02/21/22   Little Ishikawa, MD  hydrochlorothiazide (HYDRODIURIL) 25 MG tablet Take 25 mg by mouth daily. 02/10/22   [provider]  insulin degludec (TRESIBA) 100 UNIT/ML FlexTouch Pen Inject 15 Units into  the skin at bedtime. Patient taking differently: Inject 8 Units into the skin at bedtime. 02/21/22   Little Ishikawa, MD  Insulin Pen Needle 32G X 4 MM MISC Use with lantus and humalog 4 imes per day 02/21/22   Little Ishikawa, MD  labetalol (NORMODYNE) 200 MG tablet Take 1 tablet (200 mg total) by mouth 2 (two) times daily. 02/21/22   Little Ishikawa, MD  ondansetron (ZOFRAN) 4 MG tablet Take 4 mg by mouth daily as needed for nausea or vomiting. 03/17/22   [provider]  ondansetron (ZOFRAN-ODT) 4 MG  disintegrating tablet Take 1 tablet (4 mg total) by mouth every 8 (eight) hours as needed for nausea or vomiting. 05/30/22   Gareth Morgan, MD  ONE TOUCH LANCETS MISC Use to check blood sugar 8 time(s) daily 02/21/22   Little Ishikawa, MD  [facility] VERIO test strip SMARTSIG:Via Meter 8 Times Daily 12/04/20   [provider]  pantoprazole (PROTONIX) 40 MG tablet Take 1 tablet (40 mg total) by mouth daily for 14 days. 05/30/22 06/13/22  Gareth Morgan, MD  sevelamer carbonate (RENVELA) 800 MG tablet Take 800 mg by mouth 3 (three) times daily. 04/20/22   [provider]      Allergies    Fexofenadine and Latex    Review of Systems   Review of Systems  Neurological:  Positive for weakness.  All other systems reviewed and are negative.   Physical Exam Updated Vital Signs BP (!) 148/72 (BP Location: Left Wrist)   Pulse 76   Temp 98.1 F (36.7 C) (Oral)   Resp 16   SpO2 98%  Physical Exam Vitals and nursing note reviewed.  Constitutional:      General: He is not in acute distress.    Appearance: Normal appearance. He is well-developed. He is not ill-appearing, toxic-appearing or diaphoretic.  HENT:     Head: Normocephalic and atraumatic.     Right Ear: External ear normal.     Left Ear: External ear normal.     Nose: Nose normal.     Mouth/Throat:     Mouth: Mucous membranes are moist.     Pharynx: Oropharynx is clear.  Eyes:     Extraocular Movements: Extraocular movements intact.     Conjunctiva/sclera: Conjunctivae normal.  Cardiovascular:     Rate and Rhythm: Normal rate and regular rhythm.  Pulmonary:     Effort: Pulmonary effort is normal. No respiratory distress.     Breath sounds: No wheezing, rhonchi or rales.     Comments: Dialysis catheter is located in the right upper chest Chest:     Chest wall: No tenderness.  Abdominal:     General: There is no distension.     Palpations: Abdomen is soft.     Tenderness: There is no abdominal tenderness.   Musculoskeletal:        General: No swelling. Normal range of motion.     Cervical back: Normal range of motion and neck supple.     Right lower leg: No edema.     Left lower leg: No edema.  Skin:    General: Skin is warm and dry.     Coloration: Skin is not jaundiced or pale.  Neurological:     General: No focal deficit present.     Mental Status: He is alert and oriented to person, place, and time.     Cranial Nerves: No cranial nerve deficit.     Sensory: No sensory deficit.  Motor: No weakness.     Coordination: Coordination normal.  Psychiatric:        Mood and Affect: Mood normal.        Behavior: Behavior normal.        Thought Content: Thought content normal.        Judgment: Judgment normal.     ED Results / Procedures / Treatments   Labs (all labs ordered are listed, but only abnormal results are displayed) Labs Reviewed  CBC WITH DIFFERENTIAL/PLATELET - Abnormal; Notable for the following components:      Result Value   RBC 3.95 (*)    Hemoglobin 11.3 (*)    HCT 34.4 (*)    All other components within normal limits  COMPREHENSIVE METABOLIC PANEL - Abnormal; Notable for the following components:   Potassium 3.2 (*)    Glucose, Bld 257 (*)    BUN 40 (*)    Creatinine, Ser 6.66 (*)    Total Protein 6.1 (*)    GFR, Estimated 9 (*)    Anion gap 16 (*)    All other components within normal limits  TROPONIN I (HIGH SENSITIVITY) - Abnormal; Notable for the following components:   Troponin I (High Sensitivity) 22 (*)    All other components within normal limits  RESP PANEL BY RT-PCR (FLU A&B, COVID) ARPGX2  TROPONIN I (HIGH SENSITIVITY)    EKG EKG Interpretation  Date/Time:  Monday June 05 2022 10:09:12 EDT Ventricular Rate:  70 PR Interval:  196 QRS Duration: 86 QT Interval:  434 QTC Calculation: 468 R Axis:   70 Text Interpretation: Normal sinus rhythm Nonspecific T wave abnormality Confirmed by Godfrey Pick [phone]) on 06/05/2022 10:58:17  AM  Radiology DG Chest Port 1 View  Result Date: 06/05/2022 CLINICAL DATA:  Cough. EXAM: PORTABLE CHEST 1 VIEW COMPARISON:  02/18/2022. FINDINGS: Cardiac silhouette is mildly enlarged. No mediastinal or hilar masses. Right anterior chest wall, internal jugular dual lumen central venous catheter, tip projecting the right atrium, new since the prior study. Clear lungs.  No pleural effusion or pneumothorax. Skeletal structures are grossly intact. IMPRESSION: No acute cardiopulmonary disease. Electronically Signed   By: Lajean Manes M.D.   On: 06/05/2022 10:40    Procedures Procedures    Medications Ordered in ED Medications  sodium chloride 0.9 % bolus 1,000 mL (0 mLs Intravenous Stopped 06/05/22 1245)  lactated ringers bolus 500 mL (0 mLs Intravenous Stopped 06/05/22 1246)  lactated ringers bolus 500 mL (0 mLs Intravenous Stopped 06/05/22 1456)    ED Course/ Medical Decision Making/ A&P                           Medical Decision Making  This patient presents to the ED for concern of near syncope, this involves an extensive number of treatment options, and is a complaint that carries with it a high risk of complications and morbidity.  The differential diagnosis includes dehydration, arrhythmia, vasovagal episode, electrolyte abnormalities, infection, TIA   Co morbidities that complicate the patient evaluation  DM, ESRD (on HD T, TH, SA), HTN, HLD   Additional history obtained:  Additional history obtained from N/A External records from outside source obtained and reviewed including EMR   Lab Tests:  I Ordered, and personally interpreted labs.  The pertinent results include: Elevation in creatinine, BUN, and troponin consistent with ESRD.  Slight decrease in hemoglobin from last week, no leukocytosis, mild hypokalemia.  COVID is negative.   Imaging  Studies ordered:  I ordered imaging studies including chest x-ray I independently visualized and interpreted imaging which showed no  acute findings I agree with the radiologist interpretation   Cardiac Monitoring: / EKG:  The patient was maintained on a cardiac monitor.  I personally viewed and interpreted the cardiac monitored which showed an underlying rhythm of: Sinus rhythm  Problem List / ED Course / Critical interventions / Medication management  Patient is a pleasant 56 year old male presenting for transient episode of generalized weakness.  This was not in the setting of exertion.  Patient describes it as a feeling of lightheadedness and full body generalized weakness.  Although his symptoms have improved since this episode, he does still currently feel that he is not at his physical baseline.  He is well-appearing on exam.  He has no increased work of breathing.  His lungs are clear to auscultation.  He has no areas of tenderness.  He has no neurologic deficits.  Laboratory work-up was initiated.  Chest x-ray shows no acute findings.  EKG shows no ST segment abnormalities.  Patient was kept on bedside cardiac monitor.  He maintained normal sinus rhythm during his stay in the ED.  Lab work is notable for a 1 g drop in hemoglobin since last week.  Patient has not had any known sources of bleeding.  This slight decrease in his hemoglobin could have contributed to his episode this morning.  More notably, when he did arrive in the ED at 10 AM, blood pressure was found to be low.  He has had resolution of this and now his normal blood pressures.  With his improvement in blood pressure, he has also had improvement in symptoms.  Patient reports that he takes his 4 antihypertensive medications in the mornings.  This morning he took them at approximately 8 AM.  He had his episode of generalized weakness at 9 AM.  I suspect that patient's episode was secondary to hypotension from polypharmacy.  This was discussed with the patient.  Patient was advised to monitor his blood pressures at home and to space out his medications if he does  develop morning hypotension.  He was also advised to follow-up with his primary care doctor for repeat lab testing to ensure stable hemoglobin and to discuss any changes that might need to be made to his blood pressure regimen now that he is started dialysis.  In addition to the polypharmacy, I suspect the patient was also dehydrated.  He does continue make urine and has been urinating a lot.  While in the ED, following normalization of his blood pressures, he did have some orthostatic hypotension when standing.  He was given an additional 500 cc of IV fluid.  Orthostatic hypotension improved after this.  Patient was discharged in stable condition. I ordered medication including IV fluids for hydration Reevaluation of the patient after these medicines showed that the patient improved I have reviewed the patients home medicines and have made adjustments as needed   Social Determinants of Health:  Has PCP         Final Clinical Impression(s) / ED Diagnoses Final diagnoses:  Near syncope    Rx / DC Orders ED Discharge Orders     None         Godfrey Pick, MD 06/05/22 1502

## 2022-06-05 NOTE — Discharge Instructions (Signed)
Monitor your blood pressure at home.  If you develop low blood pressure after you take your medications, you will likely need to space them out.  Follow-up with your primary care doctor to discuss your blood pressure measurements at home and any changes that might need to be made to your blood pressure medications.  Return to the emergency department at any time for any new or worsening symptoms of concern.

## 2022-06-05 NOTE — ED Provider Triage Note (Signed)
Emergency Medicine Provider Triage Evaluation Note  Louis Peters , a 56 y.o. male  was evaluated in triage.  Pt complains of weakness.  Patient's wife is currently sick with COVID.  He reports this morning he woke up feeling weak and generally malaised and had 2 falls but did not hit his head.  Denies any pain or injury from the fall just reports feeling weak and rundown.  Denies losing consciousness or feeling dizzy.  He denies associated fevers or chills, no cough or congestion.  Denies chest pain or shortness of breath.  Review of Systems  Positive: Weakness, malaise, falls Negative: Fevers, chills, cough, congestion, chest pain, shortness of breath, syncope  Physical Exam  BP (!) 94/50   Pulse 72   Temp 98.4 F (36.9 C) (Oral)   Resp 14   SpO2 96%  Gen:   Awake, no distress   Resp:  Normal effort, CTA bilat MSK:   Moves extremities without difficulty  Other:  Abdomen is soft, nondistended, no focal neurologic deficits  Medical Decision Making  Medically screening exam initiated at 10:06 AM.  Appropriate orders placed.  Louis Peters was informed that the remainder of the evaluation will be completed by another provider, this initial triage assessment does not replace that evaluation, and the importance of remaining in the ED until their evaluation is complete.  Patient with recent COVID exposure, no fevers or URI symptoms but feeling generally weak and malaised, blood pressure soft on arrival.  Will initiate work-up with labs, EKG and chest x-ray as well as COVID swab.  Will order IV fluid bolus. Notified charge nurse pt needs room.   Louis Peters, [place] 06/05/22 1018

## 2022-06-05 NOTE — ED Notes (Signed)
Patient only received 500 cc fluid

## 2022-06-05 NOTE — ED Triage Notes (Signed)
Patient states he fell twice today. He has been feeling weak, malaised, dizzy and fell. Denies any LOC, head trauma, no pain from incidents. Patient states his wife is home sick with COVID, so he is unsure if he has that, but he denies any hallmark symptoms of COVID at this time. Denies fevers, chills, cough, SHOB, chest pain, headache, n/v/d. Aox4. NAD at this time.

## 2022-06-26 ENCOUNTER — Other Ambulatory Visit: Payer: Self-pay

## 2022-06-26 ENCOUNTER — Encounter (HOSPITAL_COMMUNITY): Payer: Self-pay

## 2022-06-26 ENCOUNTER — Emergency Department (HOSPITAL_COMMUNITY)
Admission: EM | Admit: 2022-06-26 | Discharge: 2022-06-26 | Discharge status: Against Medical Advice | Payer: Medicare Other | Attending: Physician Assistant | Admitting: Physician Assistant

## 2022-06-26 DIAGNOSIS — E162 Hypoglycemia, unspecified: Secondary | ICD-10-CM | POA: Diagnosis not present

## 2022-06-26 DIAGNOSIS — Z5321 Procedure and treatment not carried out due to patient leaving prior to being seen by health care provider: Secondary | ICD-10-CM | POA: Diagnosis not present

## 2022-06-26 LAB — CBC WITH DIFFERENTIAL/PLATELET
Abs Immature Granulocytes: 0.01 10*3/uL (ref 0.00–0.07)
Basophils Absolute: 0.1 10*3/uL (ref 0.0–0.1)
Basophils Relative: 1 %
Eosinophils Absolute: 0.1 10*3/uL (ref 0.0–0.5)
Eosinophils Relative: 1 %
HCT: 35.1 % — ABNORMAL LOW (ref 39.0–52.0)
Hemoglobin: 11.2 g/dL — ABNORMAL LOW (ref 13.0–17.0)
Immature Granulocytes: 0 %
Lymphocytes Relative: 11 %
Lymphs Abs: 0.8 10*3/uL (ref 0.7–4.0)
MCH: 28.8 pg (ref 26.0–34.0)
MCHC: 31.9 g/dL (ref 30.0–36.0)
MCV: 90.2 fL (ref 80.0–100.0)
Monocytes Absolute: 0.4 10*3/uL (ref 0.1–1.0)
Monocytes Relative: 6 %
Neutro Abs: 5.9 10*3/uL (ref 1.7–7.7)
Neutrophils Relative %: 81 %
Platelets: 246 10*3/uL (ref 150–400)
RBC: 3.89 MIL/uL — ABNORMAL LOW (ref 4.22–5.81)
RDW: 17 % — ABNORMAL HIGH (ref 11.5–15.5)
WBC: 7.2 10*3/uL (ref 4.0–10.5)
nRBC: 0 % (ref 0.0–0.2)

## 2022-06-26 LAB — COMPREHENSIVE METABOLIC PANEL WITH GFR
ALT: 26 U/L (ref 0–44)
AST: 27 U/L (ref 15–41)
Albumin: 4.2 g/dL (ref 3.5–5.0)
Alkaline Phosphatase: 100 U/L (ref 38–126)
Anion gap: 15 (ref 5–15)
BUN: 69 mg/dL — ABNORMAL HIGH (ref 6–20)
CO2: 27 mmol/L (ref 22–32)
Calcium: 9.3 mg/dL (ref 8.9–10.3)
Chloride: 96 mmol/L — ABNORMAL LOW (ref 98–111)
Creatinine, Ser: 7.98 mg/dL — ABNORMAL HIGH (ref 0.61–1.24)
GFR, Estimated: 7 mL/min — ABNORMAL LOW
Glucose, Bld: 289 mg/dL — ABNORMAL HIGH (ref 70–99)
Potassium: 4.6 mmol/L (ref 3.5–5.1)
Sodium: 138 mmol/L (ref 135–145)
Total Bilirubin: 1 mg/dL (ref 0.3–1.2)
Total Protein: 7.2 g/dL (ref 6.5–8.1)

## 2022-06-26 LAB — CBG MONITORING, ED: Glucose-Capillary: 175 mg/dL — ABNORMAL HIGH (ref 70–99)

## 2022-06-26 NOTE — ED Notes (Signed)
Pt called multiple times by staff, no answer

## 2022-06-26 NOTE — ED Provider Triage Note (Signed)
Emergency Medicine Provider Triage Evaluation Note  Louis Peters , a 56 y.o. male  was evaluated in triage.  Pt complains of hypoglycemia. Type 1 IDDM.  CBG noted to be 45 with EMS.  Was given D10.  Repeat CBG 88.  Had eaten along the way.  Recheck CBG 124 on arrival.  Also noted to be hypertensive.  Denies any complaints. Has been feeling "fine" No recent illnesses. No CP, SOB  Review of Systems  Positive: Hypoglycemia, hypertension Negative:   Physical Exam  There were no vitals taken for this visit. Gen:   Awake, no distress   Resp:  Normal effort  MSK:   Moves extremities without difficulty  Other:    Medical Decision Making  Medically screening exam initiated at 4:13 PM.  Appropriate orders placed.  Louis Peters was informed that the remainder of the evaluation will be completed by another provider, this initial triage assessment does not replace that evaluation, and the importance of remaining in the ED until their evaluation is complete.  Hypoglycemia, hypertension   Zadiel Leyh A, PA-C 06/26/22 1615

## 2022-06-26 NOTE — ED Triage Notes (Signed)
BIB EMS for hypoglycemia of 45  receieved 10grams D10 CBG 88   20g Lhand on the way here he ate churros and chips. CBG on arrival 124

## 2022-07-10 ENCOUNTER — Inpatient Hospital Stay (HOSPITAL_COMMUNITY)
Admission: EM | Admit: 2022-07-10 | Discharge: 2022-07-30 | DRG: 637 | Disposition: A | Discharge status: Home / Self Care | Payer: Medicare Other | Attending: Internal Medicine | Admitting: Internal Medicine

## 2022-07-10 ENCOUNTER — Emergency Department (HOSPITAL_COMMUNITY): Payer: Medicare Other

## 2022-07-10 ENCOUNTER — Encounter (HOSPITAL_COMMUNITY): Payer: Self-pay | Admitting: *Deleted

## 2022-07-10 ENCOUNTER — Other Ambulatory Visit: Payer: Self-pay

## 2022-07-10 DIAGNOSIS — E876 Hypokalemia: Secondary | ICD-10-CM | POA: Diagnosis not present

## 2022-07-10 DIAGNOSIS — R739 Hyperglycemia, unspecified: Secondary | ICD-10-CM | POA: Diagnosis present

## 2022-07-10 DIAGNOSIS — E7800 Pure hypercholesterolemia, unspecified: Secondary | ICD-10-CM | POA: Diagnosis present

## 2022-07-10 DIAGNOSIS — E1010 Type 1 diabetes mellitus with ketoacidosis without coma: Principal | ICD-10-CM | POA: Diagnosis present

## 2022-07-10 DIAGNOSIS — Z7902 Long term (current) use of antithrombotics/antiplatelets: Secondary | ICD-10-CM

## 2022-07-10 DIAGNOSIS — Z992 Dependence on renal dialysis: Secondary | ICD-10-CM

## 2022-07-10 DIAGNOSIS — E11628 Type 2 diabetes mellitus with other skin complications: Secondary | ICD-10-CM

## 2022-07-10 DIAGNOSIS — E1043 Type 1 diabetes mellitus with diabetic autonomic (poly)neuropathy: Secondary | ICD-10-CM | POA: Diagnosis present

## 2022-07-10 DIAGNOSIS — E1110 Type 2 diabetes mellitus with ketoacidosis without coma: Principal | ICD-10-CM

## 2022-07-10 DIAGNOSIS — E1022 Type 1 diabetes mellitus with diabetic chronic kidney disease: Secondary | ICD-10-CM | POA: Diagnosis present

## 2022-07-10 DIAGNOSIS — N186 End stage renal disease: Secondary | ICD-10-CM | POA: Diagnosis present

## 2022-07-10 DIAGNOSIS — E10649 Type 1 diabetes mellitus with hypoglycemia without coma: Secondary | ICD-10-CM | POA: Diagnosis not present

## 2022-07-10 DIAGNOSIS — I10 Essential (primary) hypertension: Secondary | ICD-10-CM | POA: Diagnosis present

## 2022-07-10 DIAGNOSIS — B029 Zoster without complications: Secondary | ICD-10-CM | POA: Diagnosis not present

## 2022-07-10 DIAGNOSIS — Z7982 Long term (current) use of aspirin: Secondary | ICD-10-CM

## 2022-07-10 DIAGNOSIS — F32A Depression, unspecified: Secondary | ICD-10-CM | POA: Diagnosis present

## 2022-07-10 DIAGNOSIS — F419 Anxiety disorder, unspecified: Secondary | ICD-10-CM | POA: Diagnosis present

## 2022-07-10 DIAGNOSIS — Z833 Family history of diabetes mellitus: Secondary | ICD-10-CM

## 2022-07-10 DIAGNOSIS — Z794 Long term (current) use of insulin: Secondary | ICD-10-CM

## 2022-07-10 DIAGNOSIS — I951 Orthostatic hypotension: Secondary | ICD-10-CM | POA: Diagnosis not present

## 2022-07-10 DIAGNOSIS — Z79899 Other long term (current) drug therapy: Secondary | ICD-10-CM

## 2022-07-10 DIAGNOSIS — K5900 Constipation, unspecified: Secondary | ICD-10-CM | POA: Diagnosis not present

## 2022-07-10 DIAGNOSIS — I120 Hypertensive chronic kidney disease with stage 5 chronic kidney disease or end stage renal disease: Secondary | ICD-10-CM | POA: Diagnosis present

## 2022-07-10 DIAGNOSIS — D631 Anemia in chronic kidney disease: Secondary | ICD-10-CM | POA: Diagnosis present

## 2022-07-10 DIAGNOSIS — I2510 Atherosclerotic heart disease of native coronary artery without angina pectoris: Secondary | ICD-10-CM | POA: Diagnosis present

## 2022-07-10 DIAGNOSIS — I440 Atrioventricular block, first degree: Secondary | ICD-10-CM | POA: Diagnosis present

## 2022-07-10 DIAGNOSIS — E10319 Type 1 diabetes mellitus with unspecified diabetic retinopathy without macular edema: Secondary | ICD-10-CM | POA: Diagnosis present

## 2022-07-10 DIAGNOSIS — Z888 Allergy status to other drugs, medicaments and biological substances status: Secondary | ICD-10-CM

## 2022-07-10 DIAGNOSIS — E875 Hyperkalemia: Secondary | ICD-10-CM | POA: Diagnosis present

## 2022-07-10 DIAGNOSIS — Z91040 Latex allergy status: Secondary | ICD-10-CM

## 2022-07-10 DIAGNOSIS — L089 Local infection of the skin and subcutaneous tissue, unspecified: Secondary | ICD-10-CM

## 2022-07-10 DIAGNOSIS — M898X9 Other specified disorders of bone, unspecified site: Secondary | ICD-10-CM | POA: Diagnosis present

## 2022-07-10 DIAGNOSIS — K3184 Gastroparesis: Secondary | ICD-10-CM | POA: Diagnosis present

## 2022-07-10 DIAGNOSIS — E103593 Type 1 diabetes mellitus with proliferative diabetic retinopathy without macular edema, bilateral: Secondary | ICD-10-CM | POA: Diagnosis present

## 2022-07-10 LAB — CBC WITH DIFFERENTIAL/PLATELET
Abs Immature Granulocytes: 0.03 10*3/uL (ref 0.00–0.07)
Basophils Absolute: 0 10*3/uL (ref 0.0–0.1)
Basophils Relative: 0 %
Eosinophils Absolute: 0 10*3/uL (ref 0.0–0.5)
Eosinophils Relative: 0 %
HCT: 36.9 % — ABNORMAL LOW (ref 39.0–52.0)
Hemoglobin: 11.2 g/dL — ABNORMAL LOW (ref 13.0–17.0)
Immature Granulocytes: 0 %
Lymphocytes Relative: 6 %
Lymphs Abs: 0.6 10*3/uL — ABNORMAL LOW (ref 0.7–4.0)
MCH: 28.8 pg (ref 26.0–34.0)
MCHC: 30.4 g/dL (ref 30.0–36.0)
MCV: 94.9 fL (ref 80.0–100.0)
Monocytes Absolute: 0.7 10*3/uL (ref 0.1–1.0)
Monocytes Relative: 6 %
Neutro Abs: 9.7 10*3/uL — ABNORMAL HIGH (ref 1.7–7.7)
Neutrophils Relative %: 88 %
Platelets: 346 10*3/uL (ref 150–400)
RBC: 3.89 MIL/uL — ABNORMAL LOW (ref 4.22–5.81)
RDW: 16.3 % — ABNORMAL HIGH (ref 11.5–15.5)
WBC: 11.1 10*3/uL — ABNORMAL HIGH (ref 4.0–10.5)
nRBC: 0 % (ref 0.0–0.2)

## 2022-07-10 LAB — I-STAT VENOUS BLOOD GAS, ED
Acid-base deficit: 8 mmol/L — ABNORMAL HIGH (ref 0.0–2.0)
Bicarbonate: 17 mmol/L — ABNORMAL LOW (ref 20.0–28.0)
Calcium, Ion: 0.94 mmol/L — ABNORMAL LOW (ref 1.15–1.40)
HCT: 38 % — ABNORMAL LOW (ref 39.0–52.0)
Hemoglobin: 12.9 g/dL — ABNORMAL LOW (ref 13.0–17.0)
O2 Saturation: 91 %
Potassium: 6.1 mmol/L — ABNORMAL HIGH (ref 3.5–5.1)
Sodium: 123 mmol/L — ABNORMAL LOW (ref 135–145)
TCO2: 18 mmol/L — ABNORMAL LOW (ref 22–32)
pCO2, Ven: 32.2 mmHg — ABNORMAL LOW (ref 44–60)
pH, Ven: 7.33 (ref 7.25–7.43)
pO2, Ven: 63 mmHg — ABNORMAL HIGH (ref 32–45)

## 2022-07-10 LAB — CBG MONITORING, ED: Glucose-Capillary: >600 mg/dL (ref 70–99)

## 2022-07-10 NOTE — ED Provider Triage Note (Signed)
Emergency Medicine Provider Triage Evaluation Note  Louis Peters , a 56 y.o. male  was evaluated in triage.  Pt complains of hyperglycemia, states that he has been vomiting since 1 AM yesterday, he states he has seen some streaks of blood in his vomit denies coffee emesis states he still passing gas having normal bowel movements last bowel movement was today, no stomach pains, no fevers no chills no chest pain no shortness of breath, denies any increased thirst, goes to dialysis Tuesday Thursday Saturday started 3 months ago, has not missed any dosages.  States that he is poorly controlled with his diabetes..  Review of Systems  Positive: Hyperglycemia vomiting Negative: Abdominal pain, shortness of breath  Physical Exam  BP 131/60 (BP Location: Left Arm)   Pulse 98   Temp 98.6 F (37 C) (Oral)   Resp 18   SpO2 100%  Gen:   Awake, no distress   Resp:  Normal effort  MSK:   Moves extremities without difficulty  Other:  N/A  Medical Decision Making  Medically screening exam initiated at 11:03 PM.  Appropriate orders placed.  SRIMAN TALLY was informed that the remainder of the evaluation will be completed by another provider, this initial triage assessment does not replace that evaluation, and the importance of remaining in the ED until their evaluation is complete.  Lab work imaging of been ordered will need further work-up, this patient was made a acuity 2 as of concern for DKA.   Marcello Fennel, PA-C 07/10/22 2305

## 2022-07-10 NOTE — ED Triage Notes (Signed)
Pt arrives via [facility] from home Per medic report, has had vomiting since 0200 today. Felt dehydrated. CBG high for EMS. Has rec'd 226ml NS and 4 zofran. Dialysis pt. His wife was reporting he did have a fall today. En route vitals 140/60, hr 100, 100% ra. IV in the left AC.

## 2022-07-11 DIAGNOSIS — E7800 Pure hypercholesterolemia, unspecified: Secondary | ICD-10-CM | POA: Diagnosis present

## 2022-07-11 DIAGNOSIS — I951 Orthostatic hypotension: Secondary | ICD-10-CM | POA: Diagnosis not present

## 2022-07-11 DIAGNOSIS — E1043 Type 1 diabetes mellitus with diabetic autonomic (poly)neuropathy: Secondary | ICD-10-CM | POA: Diagnosis present

## 2022-07-11 DIAGNOSIS — R739 Hyperglycemia, unspecified: Secondary | ICD-10-CM

## 2022-07-11 DIAGNOSIS — Z794 Long term (current) use of insulin: Secondary | ICD-10-CM | POA: Diagnosis not present

## 2022-07-11 DIAGNOSIS — E1110 Type 2 diabetes mellitus with ketoacidosis without coma: Secondary | ICD-10-CM

## 2022-07-11 DIAGNOSIS — E1022 Type 1 diabetes mellitus with diabetic chronic kidney disease: Secondary | ICD-10-CM | POA: Diagnosis present

## 2022-07-11 DIAGNOSIS — D631 Anemia in chronic kidney disease: Secondary | ICD-10-CM | POA: Diagnosis present

## 2022-07-11 DIAGNOSIS — K5900 Constipation, unspecified: Secondary | ICD-10-CM | POA: Diagnosis not present

## 2022-07-11 DIAGNOSIS — E11628 Type 2 diabetes mellitus with other skin complications: Secondary | ICD-10-CM | POA: Diagnosis not present

## 2022-07-11 DIAGNOSIS — K3184 Gastroparesis: Secondary | ICD-10-CM | POA: Diagnosis present

## 2022-07-11 DIAGNOSIS — I440 Atrioventricular block, first degree: Secondary | ICD-10-CM | POA: Diagnosis present

## 2022-07-11 DIAGNOSIS — E10649 Type 1 diabetes mellitus with hypoglycemia without coma: Secondary | ICD-10-CM | POA: Diagnosis not present

## 2022-07-11 DIAGNOSIS — B029 Zoster without complications: Secondary | ICD-10-CM | POA: Diagnosis not present

## 2022-07-11 DIAGNOSIS — N186 End stage renal disease: Secondary | ICD-10-CM | POA: Diagnosis present

## 2022-07-11 DIAGNOSIS — Z992 Dependence on renal dialysis: Secondary | ICD-10-CM | POA: Diagnosis not present

## 2022-07-11 DIAGNOSIS — F32A Depression, unspecified: Secondary | ICD-10-CM | POA: Diagnosis present

## 2022-07-11 DIAGNOSIS — E876 Hypokalemia: Secondary | ICD-10-CM | POA: Diagnosis not present

## 2022-07-11 DIAGNOSIS — E10319 Type 1 diabetes mellitus with unspecified diabetic retinopathy without macular edema: Secondary | ICD-10-CM | POA: Diagnosis present

## 2022-07-11 DIAGNOSIS — E103593 Type 1 diabetes mellitus with proliferative diabetic retinopathy without macular edema, bilateral: Secondary | ICD-10-CM | POA: Diagnosis present

## 2022-07-11 DIAGNOSIS — I2510 Atherosclerotic heart disease of native coronary artery without angina pectoris: Secondary | ICD-10-CM | POA: Diagnosis present

## 2022-07-11 DIAGNOSIS — M898X9 Other specified disorders of bone, unspecified site: Secondary | ICD-10-CM | POA: Diagnosis present

## 2022-07-11 DIAGNOSIS — E1010 Type 1 diabetes mellitus with ketoacidosis without coma: Secondary | ICD-10-CM | POA: Diagnosis present

## 2022-07-11 DIAGNOSIS — F419 Anxiety disorder, unspecified: Secondary | ICD-10-CM | POA: Diagnosis present

## 2022-07-11 DIAGNOSIS — E875 Hyperkalemia: Secondary | ICD-10-CM | POA: Diagnosis present

## 2022-07-11 DIAGNOSIS — I120 Hypertensive chronic kidney disease with stage 5 chronic kidney disease or end stage renal disease: Secondary | ICD-10-CM | POA: Diagnosis present

## 2022-07-11 LAB — CBG MONITORING, ED
Glucose-Capillary: 118 mg/dL — ABNORMAL HIGH (ref 70–99)
Glucose-Capillary: 144 mg/dL — ABNORMAL HIGH (ref 70–99)
Glucose-Capillary: 196 mg/dL — ABNORMAL HIGH (ref 70–99)
Glucose-Capillary: 200 mg/dL — ABNORMAL HIGH (ref 70–99)
Glucose-Capillary: 265 mg/dL — ABNORMAL HIGH (ref 70–99)
Glucose-Capillary: 298 mg/dL — ABNORMAL HIGH (ref 70–99)
Glucose-Capillary: 380 mg/dL — ABNORMAL HIGH (ref 70–99)
Glucose-Capillary: 532 mg/dL (ref 70–99)
Glucose-Capillary: 536 mg/dL (ref 70–99)
Glucose-Capillary: 97 mg/dL (ref 70–99)
Glucose-Capillary: >600 mg/dL (ref 70–99)
Glucose-Capillary: >600 mg/dL (ref 70–99)
Glucose-Capillary: >600 mg/dL (ref 70–99)
Glucose-Capillary: >600 mg/dL (ref 70–99)
Glucose-Capillary: >600 mg/dL (ref 70–99)
Glucose-Capillary: >600 mg/dL (ref 70–99)
Glucose-Capillary: >600 mg/dL (ref 70–99)

## 2022-07-11 LAB — CBC
HCT: 28.5 % — ABNORMAL LOW (ref 39.0–52.0)
HCT: 29.2 % — ABNORMAL LOW (ref 39.0–52.0)
Hemoglobin: 9.7 g/dL — ABNORMAL LOW (ref 13.0–17.0)
Hemoglobin: 9.8 g/dL — ABNORMAL LOW (ref 13.0–17.0)
MCH: 29.8 pg (ref 26.0–34.0)
MCH: 29.2 pg (ref 26.0–34.0)
MCHC: 34 g/dL (ref 30.0–36.0)
MCHC: 33.6 g/dL (ref 30.0–36.0)
MCV: 87.7 fL (ref 80.0–100.0)
MCV: 86.9 fL (ref 80.0–100.0)
Platelets: 322 10*3/uL (ref 150–400)
Platelets: 361 10*3/uL (ref 150–400)
RBC: 3.25 MIL/uL — ABNORMAL LOW (ref 4.22–5.81)
RBC: 3.36 MIL/uL — ABNORMAL LOW (ref 4.22–5.81)
RDW: 15.8 % — ABNORMAL HIGH (ref 11.5–15.5)
RDW: 15.9 % — ABNORMAL HIGH (ref 11.5–15.5)
WBC: 10.6 10*3/uL — ABNORMAL HIGH (ref 4.0–10.5)
WBC: 10.6 10*3/uL — ABNORMAL HIGH (ref 4.0–10.5)
nRBC: 0 % (ref 0.0–0.2)
nRBC: 0 % (ref 0.0–0.2)

## 2022-07-11 LAB — BASIC METABOLIC PANEL
Anion gap: 15 (ref 5–15)
Anion gap: 16 — ABNORMAL HIGH (ref 5–15)
BUN: 84 mg/dL — ABNORMAL HIGH (ref 6–20)
BUN: 81 mg/dL — ABNORMAL HIGH (ref 6–20)
CO2: 30 mmol/L (ref 22–32)
CO2: 29 mmol/L (ref 22–32)
Calcium: 8.6 mg/dL — ABNORMAL LOW (ref 8.9–10.3)
Calcium: 8.5 mg/dL — ABNORMAL LOW (ref 8.9–10.3)
Chloride: 92 mmol/L — ABNORMAL LOW (ref 98–111)
Chloride: 94 mmol/L — ABNORMAL LOW (ref 98–111)
Creatinine, Ser: 8.61 mg/dL — ABNORMAL HIGH (ref 0.61–1.24)
Creatinine, Ser: 8.66 mg/dL — ABNORMAL HIGH (ref 0.61–1.24)
GFR, Estimated: 7 mL/min — ABNORMAL LOW (ref >60)
GFR, Estimated: 7 mL/min — ABNORMAL LOW (ref >60)
Glucose, Bld: 385 mg/dL — ABNORMAL HIGH (ref 70–99)
Glucose, Bld: 142 mg/dL — ABNORMAL HIGH (ref 70–99)
Potassium: 4 mmol/L (ref 3.5–5.1)
Potassium: 3.6 mmol/L (ref 3.5–5.1)
Sodium: 137 mmol/L (ref 135–145)
Sodium: 139 mmol/L (ref 135–145)

## 2022-07-11 LAB — BETA-HYDROXYBUTYRIC ACID
Beta-Hydroxybutyric Acid: 7.95 mmol/L — ABNORMAL HIGH (ref 0.05–0.27)
Beta-Hydroxybutyric Acid: 0.07 mmol/L (ref 0.05–0.27)

## 2022-07-11 LAB — COMPREHENSIVE METABOLIC PANEL
ALT: 19 U/L (ref 0–44)
AST: 22 U/L (ref 15–41)
Albumin: 3.6 g/dL (ref 3.5–5.0)
Alkaline Phosphatase: 84 U/L (ref 38–126)
Anion gap: 27 — ABNORMAL HIGH (ref 5–15)
BUN: 78 mg/dL — ABNORMAL HIGH (ref 6–20)
CO2: 16 mmol/L — ABNORMAL LOW (ref 22–32)
Calcium: 8.6 mg/dL — ABNORMAL LOW (ref 8.9–10.3)
Chloride: 84 mmol/L — ABNORMAL LOW (ref 98–111)
Creatinine, Ser: 8.75 mg/dL — ABNORMAL HIGH (ref 0.61–1.24)
GFR, Estimated: 7 mL/min — ABNORMAL LOW (ref >60)
Glucose, Bld: 1061 mg/dL (ref 70–99)
Potassium: 6.2 mmol/L — ABNORMAL HIGH (ref 3.5–5.1)
Sodium: 127 mmol/L — ABNORMAL LOW (ref 135–145)
Total Bilirubin: 2 mg/dL — ABNORMAL HIGH (ref 0.3–1.2)
Total Protein: 6.3 g/dL — ABNORMAL LOW (ref 6.5–8.1)

## 2022-07-11 LAB — LIPASE, BLOOD: Lipase: 39 U/L (ref 11–51)

## 2022-07-11 LAB — CREATININE, SERUM
Creatinine, Ser: 8.86 mg/dL — ABNORMAL HIGH (ref 0.61–1.24)
GFR, Estimated: 6 mL/min — ABNORMAL LOW (ref >60)

## 2022-07-11 LAB — HEPATITIS B SURFACE ANTIGEN: Hepatitis B Surface Ag: NONREACTIVE

## 2022-07-11 LAB — HEPATITIS B SURFACE ANTIBODY,QUALITATIVE: Hep B S Ab: NONREACTIVE

## 2022-07-11 LAB — PHOSPHORUS: Phosphorus: 6.4 mg/dL — ABNORMAL HIGH (ref 2.5–4.6)

## 2022-07-11 LAB — HEPATITIS C ANTIBODY: HCV Ab: NONREACTIVE

## 2022-07-11 LAB — MAGNESIUM: Magnesium: 2.3 mg/dL (ref 1.7–2.4)

## 2022-07-11 LAB — HEPATITIS B CORE ANTIBODY, TOTAL: Hep B Core Total Ab: NONREACTIVE

## 2022-07-11 MED ORDER — DEXTROSE IN LACTATED RINGERS 5 % IV SOLN: INTRAVENOUS | Status: DC

## 2022-07-11 MED ORDER — ASPIRIN 81 MG PO TBEC
81.0000 mg | DELAYED_RELEASE_TABLET | Freq: Every morning | ORAL | Status: DC
  Administered 2022-07-11 – 2022-07-30 (×20): 81 mg via ORAL
  Filled 2022-07-11 (×20): qty 1

## 2022-07-11 MED ORDER — AMLODIPINE BESYLATE 10 MG PO TABS
10.0000 mg | ORAL_TABLET | Freq: Every morning | ORAL | Status: DC
  Administered 2022-07-11 – 2022-07-12 (×2): 10 mg via ORAL
  Filled 2022-07-11: qty 1
  Filled 2022-07-11: qty 2

## 2022-07-11 MED ORDER — CALCITRIOL 0.25 MCG PO CAPS
0.5000 ug | ORAL_CAPSULE | ORAL | Status: DC
  Administered 2022-07-11 – 2022-07-27 (×8): 0.5 ug via ORAL
  Filled 2022-07-11 (×3): qty 2
  Filled 2022-07-11: qty 1
  Filled 2022-07-11 (×5): qty 2

## 2022-07-11 MED ORDER — INSULIN ASPART 100 UNIT/ML IJ SOLN
0.0000 [IU] | Freq: Three times a day (TID) | INTRAMUSCULAR | Status: DC
  Administered 2022-07-12: 3 [IU] via SUBCUTANEOUS
  Administered 2022-07-12 (×2): 2 [IU] via SUBCUTANEOUS
  Administered 2022-07-13: 4 [IU] via SUBCUTANEOUS

## 2022-07-11 MED ORDER — CHLORHEXIDINE GLUCONATE CLOTH 2 % EX PADS
6.0000 | MEDICATED_PAD | Freq: Every day | CUTANEOUS | Status: DC
  Administered 2022-07-13: 6 via TOPICAL

## 2022-07-11 MED ORDER — INSULIN REGULAR(HUMAN) IN NACL 100-0.9 UT/100ML-% IV SOLN
INTRAVENOUS | Status: DC
  Administered 2022-07-11: 9 [IU]/h via INTRAVENOUS
  Filled 2022-07-11: qty 100

## 2022-07-11 MED ORDER — LABETALOL HCL 200 MG PO TABS
200.0000 mg | ORAL_TABLET | Freq: Two times a day (BID) | ORAL | Status: DC
  Administered 2022-07-11 – 2022-07-12 (×3): 200 mg via ORAL
  Filled 2022-07-11 (×4): qty 1

## 2022-07-11 MED ORDER — HEPARIN SODIUM (PORCINE) 5000 UNIT/ML IJ SOLN
5000.0000 [IU] | Freq: Three times a day (TID) | INTRAMUSCULAR | Status: DC
  Administered 2022-07-11 – 2022-07-30 (×51): 5000 [IU] via SUBCUTANEOUS
  Filled 2022-07-11 (×51): qty 1

## 2022-07-11 MED ORDER — DEXTROSE 50 % IV SOLN: 0.0000 mL | INTRAVENOUS | Status: DC | PRN

## 2022-07-11 MED ORDER — INSULIN REGULAR(HUMAN) IN NACL 100-0.9 UT/100ML-% IV SOLN
INTRAVENOUS | Status: DC
  Administered 2022-07-11: 7.5 [IU]/h via INTRAVENOUS
  Filled 2022-07-11: qty 100

## 2022-07-11 MED ORDER — ACETAMINOPHEN 325 MG PO TABS
650.0000 mg | ORAL_TABLET | Freq: Four times a day (QID) | ORAL | Status: DC | PRN
  Administered 2022-07-15 – 2022-07-17 (×2): 650 mg via ORAL
  Filled 2022-07-11 (×2): qty 2

## 2022-07-11 MED ORDER — HYDRALAZINE HCL 50 MG PO TABS
50.0000 mg | ORAL_TABLET | Freq: Three times a day (TID) | ORAL | Status: DC
  Administered 2022-07-11 – 2022-07-13 (×5): 50 mg via ORAL
  Filled 2022-07-11 (×2): qty 1
  Filled 2022-07-11: qty 2
  Filled 2022-07-11: qty 1
  Filled 2022-07-11: qty 2

## 2022-07-11 MED ORDER — LACTATED RINGERS IV BOLUS
1700.0000 mL | Freq: Once | INTRAVENOUS | Status: AC
  Administered 2022-07-11: 1700 mL via INTRAVENOUS

## 2022-07-11 MED ORDER — CLOPIDOGREL BISULFATE 75 MG PO TABS
75.0000 mg | ORAL_TABLET | Freq: Every morning | ORAL | Status: DC
  Administered 2022-07-11 – 2022-07-30 (×20): 75 mg via ORAL
  Filled 2022-07-11 (×20): qty 1

## 2022-07-11 MED ORDER — PROMETHAZINE HCL 25 MG PO TABS: 12.5000 mg | ORAL_TABLET | Freq: Four times a day (QID) | ORAL | Status: DC | PRN

## 2022-07-11 MED ORDER — INSULIN ASPART 100 UNIT/ML IJ SOLN
0.0000 [IU] | Freq: Every day | INTRAMUSCULAR | Status: DC
  Administered 2022-07-11: 3 [IU] via SUBCUTANEOUS
  Administered 2022-07-12: 4 [IU] via SUBCUTANEOUS

## 2022-07-11 MED ORDER — SODIUM CHLORIDE 0.9 % IV BOLUS
500.0000 mL | Freq: Once | INTRAVENOUS | Status: AC
  Administered 2022-07-11: 500 mL via INTRAVENOUS

## 2022-07-11 MED ORDER — INSULIN ASPART 100 UNIT/ML IJ SOLN
8.0000 [IU] | Freq: Once | INTRAMUSCULAR | Status: AC
  Administered 2022-07-11: 8 [IU] via SUBCUTANEOUS

## 2022-07-11 MED ORDER — DARBEPOETIN ALFA 100 MCG/0.5ML IJ SOSY
100.0000 ug | PREFILLED_SYRINGE | INTRAMUSCULAR | Status: DC
  Administered 2022-07-20 – 2022-07-27 (×2): 100 ug via INTRAVENOUS
  Filled 2022-07-11 (×5): qty 0.5

## 2022-07-11 MED ORDER — INSULIN GLARGINE-YFGN 100 UNIT/ML ~~LOC~~ SOLN
10.0000 [IU] | Freq: Every day | SUBCUTANEOUS | Status: DC
  Administered 2022-07-11 – 2022-07-13 (×3): 10 [IU] via SUBCUTANEOUS
  Filled 2022-07-11 (×5): qty 0.1

## 2022-07-11 MED ORDER — ATORVASTATIN CALCIUM 80 MG PO TABS
80.0000 mg | ORAL_TABLET | Freq: Every day | ORAL | Status: DC
  Administered 2022-07-11 – 2022-07-30 (×20): 80 mg via ORAL
  Filled 2022-07-11 (×8): qty 1
  Filled 2022-07-11: qty 2
  Filled 2022-07-11 (×11): qty 1

## 2022-07-11 MED ORDER — SODIUM BICARBONATE 8.4 % IV SOLN
50.0000 meq | Freq: Once | INTRAVENOUS | Status: AC
  Administered 2022-07-11: 50 meq via INTRAVENOUS
  Filled 2022-07-11 (×2): qty 50

## 2022-07-11 MED ORDER — INSULIN REGULAR(HUMAN) IN NACL 100-0.9 UT/100ML-% IV SOLN: INTRAVENOUS | Status: DC

## 2022-07-11 MED ORDER — HEPARIN SODIUM (PORCINE) 5000 UNIT/ML IJ SOLN: 5000.0000 [IU] | Freq: Three times a day (TID) | INTRAMUSCULAR | Status: DC

## 2022-07-11 MED ORDER — ACETAMINOPHEN 650 MG RE SUPP: 650.0000 mg | Freq: Four times a day (QID) | RECTAL | Status: DC | PRN

## 2022-07-11 MED ORDER — SEVELAMER CARBONATE 800 MG PO TABS
800.0000 mg | ORAL_TABLET | Freq: Three times a day (TID) | ORAL | Status: DC
  Administered 2022-07-11 – 2022-07-18 (×19): 800 mg via ORAL
  Filled 2022-07-11 (×19): qty 1

## 2022-07-11 MED ORDER — SODIUM ZIRCONIUM CYCLOSILICATE 10 G PO PACK
10.0000 g | PACK | Freq: Once | ORAL | Status: AC
  Administered 2022-07-11: 10 g via ORAL
  Filled 2022-07-11: qty 1
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
## 2022-07-11 NOTE — ED Notes (Signed)
Notified provider of pt's CBG result. Will continue to monitor and await orders.

## 2022-07-11 NOTE — Progress Notes (Signed)
Patient seen and examined, admitted by Dr. Annie Paras this morning  Briefly 56 year old male with ESRD on HD TTS, diabetes mellitus type 1, HTN, HLP presented with nausea vomiting and epigastric abdominal pain.  Patient is on 712 long-acting insulin and 5 units with meals, reported that his blood sugars have been especially high over the last few days.  Patient reported that he developed progressively worsening nausea and vomiting in the last 24 hours, unable to hold anything down  In ED, sodium 127, potassium 6.2, bicarb 16, glucose 1061, creatinine 8.75 Patient was admitted for further work-up  BP (!) 130/59   Pulse 79   Temp 98.5 F (36.9 C)   Resp 11   SpO2 95%   Physical Exam General: Alert and oriented x 3, NAD, dry mucous membranes Cardiovascular: S1 S2 clear, RRR.  Respiratory: CTAB, no wheezing Gastrointestinal: Soft, nontender, nondistended, NBS Ext: no pedal edema bilaterally Neuro: no new deficits  BMET    Component Value Date/Time   NA 123 (L) 07/10/2022 2320   K 6.1 (H) 07/10/2022 2320   CL 84 (L) 07/10/2022 2308   CO2 16 (L) 07/10/2022 2308   GLUCOSE 1,061 (HH) 07/10/2022 2308   BUN 78 (H) 07/10/2022 2308   CREATININE 8.86 (H) 07/11/2022 0613   CALCIUM 8.6 (L) 07/10/2022 2308   GFRNONAA 6 (L) 07/11/2022 5993    Recent Labs    07/11/22 0548 07/11/22 5701 07/11/22 0729 07/11/22 0747 07/11/22 0856 07/11/22 0940  GLUCAP >600* >600* >600* >600* 532* 536*   A/p   Diabetic ketoacidosis in the setting of poorly controlled diabetes mellitus AG metabolic acidosis, pseudohyponatremia, POA Hyperkalemia, POA  -Patient started on insulin drip however has been persistently elevated >600's -Continue insulin drip, obtain stat bmet -Last hemoglobin A1c 10.7 on 10/14/2021, will recheck -Continue insulin drip until AG closed, metabolic acidosis corrected, CBG x 4<180 and BHB WNL.  - NPO except ice chips  ESRD -On hemodialysis, TTS, nephrology consulted -Needs HD  today  Rest per admit H&P   Dawsyn Zurn M.D.  [facility] 07/11/2022, 10:35 AM

## 2022-07-11 NOTE — ED Notes (Signed)
Checked patient cbg it was 380 notified RN of blood sugar patient is resting with call bell in reach

## 2022-07-11 NOTE — ED Provider Notes (Signed)
Presents with nausea vomiting hyperglycemia, reassess patient's lab work showed that he had evidence of DKA, patient had only received 500 ml of fluids, I ordered additional 500 ml of fluid, start him on insulin, given Lokelma as well as bicarb, will continue to monitor patient till room becomes available.   Marcello Fennel, PA-C 07/11/22 1735    Merryl Hacker, MD 07/11/22 [phone]
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
## 2022-07-11 NOTE — ED Notes (Signed)
Per Dr. [name], titrate the insulin up by 1unit q30 minutes until pt under 600. Insulin rate increased to 8.5 despite endotool recommendation.

## 2022-07-11 NOTE — Progress Notes (Addendum)
Inpatient Diabetes Program Recommendations  AACE/ADA: New Consensus Statement on Inpatient Glycemic Control (2015)  Target Ranges:  Prepandial:   less than 140 mg/dL      Peak postprandial:   less than 180 mg/dL (1-2 hours)      Critically ill patients:  140 - 180 mg/dL   Lab Results  Component Value Date   GLUCAP 536 (HH) 07/11/2022   HGBA1C 10.7 (H) 10/14/2021    Latest Reference Range & Units 07/10/22 23:08  Glucose 70 - 99 mg/dL 1,061 (HH)   Review of Glycemic Control  Latest Reference Range & Units 07/11/22 05:48 07/11/22 06:32 07/11/22 07:29 07/11/22 07:47 07/11/22 08:56 07/11/22 09:40  Glucose-Capillary 70 - 99 mg/dL >600 (HH) >600 (HH) >600 (HH) >600 (HH) 532 (HH) 536 (HH)   Diabetes history: DM 1/ESRD Outpatient Diabetes medications:  Humalog 4 units tid with meals Tresiba 8 units daily  Current orders for Inpatient glycemic control:  IV insulin/DKA order set  Inpatient Diabetes Program Recommendations:   Agree with current orders.  Unclear what precipitated DKA.  Will follow up with patient when appropriate.   Thanks,  Adah Perl, RN, BC-ADM Inpatient Diabetes Coordinator Pager [phone]  (8a-5p)  1330 Addendum: Spoke to patient regarding DM management.  He states that blood sugars started increasing on Sunday.  He states that he has been taking his insulin as usual. However per MD's note "he did not take any of his insulin yesterday secondary to his ongoing vomiting".  Blood sugars are starting to come down with IV insulin.   Once acidosis is cleared, consider Semglee 10 units daily (2 hours prior to d/c of insulin drip), Novolog 0-6 units tid with meals and HS, and Novolog meal coverage 3 units tid with meals (hold if patient eats less than 50% or NPO).  Will follow.
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
## 2022-07-12 ENCOUNTER — Encounter (HOSPITAL_COMMUNITY): Payer: Self-pay | Admitting: Internal Medicine

## 2022-07-12 DIAGNOSIS — L089 Local infection of the skin and subcutaneous tissue, unspecified: Secondary | ICD-10-CM

## 2022-07-12 DIAGNOSIS — N186 End stage renal disease: Secondary | ICD-10-CM | POA: Diagnosis not present

## 2022-07-12 DIAGNOSIS — R739 Hyperglycemia, unspecified: Secondary | ICD-10-CM | POA: Diagnosis not present

## 2022-07-12 DIAGNOSIS — E1110 Type 2 diabetes mellitus with ketoacidosis without coma: Secondary | ICD-10-CM | POA: Diagnosis not present

## 2022-07-12 DIAGNOSIS — E11628 Type 2 diabetes mellitus with other skin complications: Secondary | ICD-10-CM

## 2022-07-12 LAB — CBC
HCT: 30.6 % — ABNORMAL LOW (ref 39.0–52.0)
Hemoglobin: 10.3 g/dL — ABNORMAL LOW (ref 13.0–17.0)
MCH: 29.1 pg (ref 26.0–34.0)
MCHC: 33.7 g/dL (ref 30.0–36.0)
MCV: 86.4 fL (ref 80.0–100.0)
Platelets: 326 10*3/uL (ref 150–400)
RBC: 3.54 MIL/uL — ABNORMAL LOW (ref 4.22–5.81)
RDW: 16.1 % — ABNORMAL HIGH (ref 11.5–15.5)
WBC: 10.6 10*3/uL — ABNORMAL HIGH (ref 4.0–10.5)
nRBC: 0 % (ref 0.0–0.2)

## 2022-07-12 LAB — GLUCOSE, CAPILLARY
Glucose-Capillary: 202 mg/dL — ABNORMAL HIGH (ref 70–99)
Glucose-Capillary: 297 mg/dL — ABNORMAL HIGH (ref 70–99)
Glucose-Capillary: 231 mg/dL — ABNORMAL HIGH (ref 70–99)
Glucose-Capillary: 320 mg/dL — ABNORMAL HIGH (ref 70–99)

## 2022-07-12 LAB — BASIC METABOLIC PANEL
Anion gap: 17 — ABNORMAL HIGH (ref 5–15)
Anion gap: 13 (ref 5–15)
BUN: 83 mg/dL — ABNORMAL HIGH (ref 6–20)
BUN: 40 mg/dL — ABNORMAL HIGH (ref 6–20)
CO2: 28 mmol/L (ref 22–32)
CO2: 30 mmol/L (ref 22–32)
Calcium: 8.7 mg/dL — ABNORMAL LOW (ref 8.9–10.3)
Calcium: 8.8 mg/dL — ABNORMAL LOW (ref 8.9–10.3)
Chloride: 94 mmol/L — ABNORMAL LOW (ref 98–111)
Chloride: 93 mmol/L — ABNORMAL LOW (ref 98–111)
Creatinine, Ser: 8.76 mg/dL — ABNORMAL HIGH (ref 0.61–1.24)
Creatinine, Ser: 5.39 mg/dL — ABNORMAL HIGH (ref 0.61–1.24)
GFR, Estimated: 6 mL/min — ABNORMAL LOW (ref >60)
GFR, Estimated: 12 mL/min — ABNORMAL LOW (ref >60)
Glucose, Bld: 217 mg/dL — ABNORMAL HIGH (ref 70–99)
Glucose, Bld: 186 mg/dL — ABNORMAL HIGH (ref 70–99)
Potassium: 3.6 mmol/L (ref 3.5–5.1)
Potassium: 3.7 mmol/L (ref 3.5–5.1)
Sodium: 139 mmol/L (ref 135–145)
Sodium: 136 mmol/L (ref 135–145)

## 2022-07-12 LAB — HEMOGLOBIN A1C
Hgb A1c MFr Bld: 11.2 % — ABNORMAL HIGH (ref 4.8–5.6)
Mean Plasma Glucose: 275 mg/dL

## 2022-07-12 LAB — HEPATITIS B SURFACE ANTIBODY, QUANTITATIVE: Hep B S AB Quant (Post): 3.5 m[IU]/mL — ABNORMAL LOW (ref >9.9)

## 2022-07-12 LAB — BETA-HYDROXYBUTYRIC ACID: Beta-Hydroxybutyric Acid: 1.37 mmol/L — ABNORMAL HIGH (ref 0.05–0.27)

## 2022-07-12 MED ORDER — INSULIN PEN NEEDLE 32G X 4 MM MISC: 3 refills | Status: AC

## 2022-07-12 MED ORDER — SERTRALINE HCL 50 MG PO TABS: 50.0000 mg | ORAL_TABLET | Freq: Every day | ORAL | 1 refills | Status: DC

## 2022-07-12 MED ORDER — ONETOUCH LANCETS MISC: 3 refills | Status: AC

## 2022-07-12 MED ORDER — FREESTYLE LIBRE 2 SENSOR MISC: 3 refills | Status: AC

## 2022-07-12 MED ORDER — INSULIN DEGLUDEC 100 UNIT/ML ~~LOC~~ SOPN: 10.0000 [IU] | PEN_INJECTOR | Freq: Every day | SUBCUTANEOUS | 0 refills | Status: DC

## 2022-07-12 MED ORDER — PANTOPRAZOLE SODIUM 40 MG PO TBEC: 40.0000 mg | DELAYED_RELEASE_TABLET | Freq: Every day | ORAL | 3 refills | Status: DC

## 2022-07-12 MED ORDER — ONDANSETRON HCL 4 MG/2ML IJ SOLN
4.0000 mg | Freq: Four times a day (QID) | INTRAMUSCULAR | Status: DC | PRN
  Administered 2022-07-12 – 2022-07-16 (×3): 4 mg via INTRAVENOUS
  Filled 2022-07-12 (×3): qty 2

## 2022-07-12 MED ORDER — AMLODIPINE BESYLATE 10 MG PO TABS: 10.0000 mg | ORAL_TABLET | Freq: Every morning | ORAL | Status: DC

## 2022-07-12 MED ORDER — ONETOUCH VERIO VI STRP: ORAL_STRIP | 12 refills | Status: AC

## 2022-07-12 MED ORDER — ONDANSETRON 4 MG PO TBDP: 4.0000 mg | ORAL_TABLET | Freq: Three times a day (TID) | ORAL | 0 refills | Status: DC | PRN

## 2022-07-12 MED ORDER — HUMALOG KWIKPEN 100 UNIT/ML ~~LOC~~ SOPN: 5.0000 [IU] | PEN_INJECTOR | Freq: Three times a day (TID) | SUBCUTANEOUS | 11 refills | Status: DC

## 2022-07-12 MED ORDER — FREESTYLE LIBRE 2 READER DEVI: 3 refills | Status: AC

## 2022-07-12 MED ORDER — INSULIN ASPART 100 UNIT/ML IJ SOLN
3.0000 [IU] | Freq: Three times a day (TID) | INTRAMUSCULAR | Status: DC
  Administered 2022-07-12 – 2022-07-13 (×4): 3 [IU] via SUBCUTANEOUS

## 2022-07-12 MED ORDER — SERTRALINE HCL 50 MG PO TABS
50.0000 mg | ORAL_TABLET | Freq: Every day | ORAL | Status: DC
  Administered 2022-07-12 – 2022-07-30 (×19): 50 mg via ORAL
  Filled 2022-07-12 (×20): qty 1

## 2022-07-12 MED ORDER — INSULIN LISPRO (1 UNIT DIAL) 100 UNIT/ML (KWIKPEN): PEN_INJECTOR | SUBCUTANEOUS | 11 refills | Status: AC

## 2022-07-12 NOTE — Progress Notes (Signed)
[place] Progress Note   Subjective:    Seen and examined patient at bedside. No acute complaints. Denies SOB, CP, and N/V. Tolerated yesterday's HD with net UF 2.4L. Currently off insulin drip. Recent serum glucose 186. He reports he may be going home today.  Objective Vitals:   07/12/22 0624 07/12/22 0635 07/12/22 0807 07/12/22 1200  BP: 133/71 133/71 134/62 114/60  Pulse: 77 79 81 77  Resp: 12 12 16 15   Temp:  98.2 F (36.8 C) 98.8 F (37.1 C) 99.6 F (37.6 C)  TempSrc: Oral  Oral Oral  SpO2: 100% 100% 98% 96%  Weight:  75 kg     Physical Exam General: Awake, alert, NAD, on RA Heart: S1 and S2; No MRGs Lungs: Clear throughout; No wheezing, rales, or rhonchi Abdomen: Soft and non-tender Extremities: No LE edema Dialysis Access: RUE AVF (+) B/T   Filed Weights   07/12/22 0230 07/12/22 0245 07/12/22 0635  Weight: 77.4 kg 77.4 kg 75 kg    Intake/Output Summary (Last 24 hours) at 07/12/2022 1305 Last data filed at 07/12/2022 9892 Gross per 24 hour  Intake 281.26 ml  Output 2400 ml  Net -2118.74 ml    Additional Objective Labs: Basic Metabolic Panel: Recent Labs  Lab 07/11/22 1144 07/11/22 1535 07/12/22 0252 07/12/22 0758  NA 137 139 139 136  K 4.0 3.6 3.6 3.7  CL 92* 94* 94* 93*  CO2 30 29 28 30   GLUCOSE 385* 142* 217* 186*  BUN 84* 81* 83* 40*  CREATININE 8.61* 8.66* 8.76* 5.39*  CALCIUM 8.6* 8.5* 8.7* 8.8*  PHOS 6.4*  --   --   --    Liver Function Tests: Recent Labs  Lab 07/10/22 2308  AST 22  ALT 19  ALKPHOS 84  BILITOT 2.0*  PROT 6.3*  ALBUMIN 3.6   Recent Labs  Lab 07/10/22 2308  LIPASE 39   CBC: Recent Labs  Lab 07/10/22 2308 07/10/22 2320 07/11/22 0613 07/11/22 1144 07/12/22 0758  WBC 11.1*  --  10.6* 10.6* 10.6*  NEUTROABS 9.7*  --   --   --   --   HGB 11.2*   < > 9.7* 9.8* 10.3*  HCT 36.9*   < > 28.5* 29.2* 30.6*  MCV 94.9  --  87.7 86.9 86.4  PLT 346  --  322 361 326   < > = values in this interval  not displayed.   Blood Culture    Component Value Date/Time   SDES  01/02/2021 1210    BLOOD LEFT HAND Performed at [facility], [place]., [place], [place] [address]    [facility]  01/02/2021 1210    BOTTLES DRAWN AEROBIC AND ANAEROBIC Blood Culture adequate volume Performed at [facility], [place] [place], [place] [address]    CULT  01/02/2021 1210    NO GROWTH 5 DAYS Performed at [facility], [place] [address] [place], [place] [address]    REPTSTATUS 01/07/2021 FINAL 01/02/2021 1210    Cardiac Enzymes: No results for input(s): "CKTOTAL", "CKMB", "CKMBINDEX", "TROPONINI" in the last 168 hours. CBG: Recent Labs  Lab 07/11/22 1737 07/11/22 1931 07/11/22 2341 07/12/22 0805 07/12/22 1209  GLUCAP 97 196* 265* 202* 297*   Iron Studies: No results for input(s): "IRON", "TIBC", "TRANSFERRIN", "FERRITIN" in the last 72 hours. Lab Results  Component Value Date   INR 0.9 07/22/2020   Studies/Results: DG Chest 1 View  Result Date: 07/10/2022 CLINICAL  DATA:  Hyperglycemia.  Vomiting since yesterday. EXAM: CHEST  1 VIEW COMPARISON:  06/05/2022 FINDINGS: Normal heart size and pulmonary vascularity. No focal airspace disease or consolidation in the lungs. No blunting of costophrenic angles. No pneumothorax. Mediastinal contours appear intact. Calcification of the aorta. Old left rib fractures. IMPRESSION: No active disease. Electronically Signed   By: [name] M.D.   On: 07/10/2022 23:20    Medications:   amLODipine  10 mg Oral q morning   aspirin EC  81 mg Oral q morning   atorvastatin  80 mg Oral Daily   calcitRIOL  0.5 mcg Oral Q T,Th,Sa-HD   Chlorhexidine Gluconate Cloth  6 each Topical Q0600   clopidogrel  75 mg Oral q morning   [START ON 07/13/2022] darbepoetin (ARANESP) injection - DIALYSIS  100 mcg Intravenous Q Thu-HD   heparin  5,000 Units Subcutaneous Q8H   hydrALAZINE  50 mg Oral Q8H   insulin aspart  0-5 Units  Subcutaneous QHS   insulin aspart  0-6 Units Subcutaneous TID WC   insulin aspart  3 Units Subcutaneous TID WC   insulin glargine-yfgn  10 Units Subcutaneous Daily   labetalol  200 mg Oral BID   sertraline  50 mg Oral Daily   sevelamer carbonate  800 mg Oral TID WC    Dialysis Orders: TTS [facility] 4h  400/1.5  75kg  2/2 bath  Hep none RUA AVF - last HD 10/7, post wt 76.1 - last Hb 10.4 on 10/5 - mircera 120 ug q 4 wks, last  9/14 > due 10/12 thurs - venofer 50 mg weekly - calcitriol 0.5 ug tiw po tts  Home meds include - amlodipine 10, aspirin, atovastatin, clopidogrel, humalog insulin/ degludec, hydralazine 50 tid, hctz 25 qd, labetalol 200 bid, pantoprazole, sevelamer carb 800 tid, prns/ vits/ supps   Assessment/Plan: DKA/ DM1 - BS 1061 at admit. IV insulin now off. Recent serum glucose 186, per pmd ESRD - on HD TTS. Next HD 10/12.  HTN/ volume - BP's good, euvolemic on exam. Got 2.2 L bolus in ED yesterday. Home BP meds x 3 ordered here. Added hold for SBP < 120 to the BP meds orders. Also, let's try to avoid saline IVF"s (LR, NS) w/ the DKA protocol. If IVF"s needed to avoid low BS, recommend just using D5W or D510.  Anemia esrd - ESA recently started here. Hgb now 10.3 MBD ckd - CCa in range, PO4 elevated, cont po vdra tts and renvela.   Dispo: Okay for discharge from renal standpoint if blood sugars remain controlled. He can resume HD tomorrow in outpatient. Patient's wife plans on calling [place] transportation ([employer]) to ensure he make his scheduled HD treatment.   Louis Poet, NP [facility] 07/12/2022,1:05 PM  LOS: 1 day

## 2022-07-12 NOTE — Discharge Summary (Signed)
Physician Discharge Summary   Patient: Louis Peters MRN: [id number] DOB: [birth year]  Admit date:     07/10/2022  Discharge date: 07/12/22  Discharge Physician: Estill Cotta, MD    PCP: Bernerd Limbo, MD   Recommendations at discharge:   Norvasc placed on hold due to borderline BP and orthostatic, until follow up with his PCP or nephrologist. Continue labetalol, hydralazine Basal insulin increased to 10 mg qhs  Continue Humalog 5 units 3 times daily AC, sliding scale correction factor recommended as per instructions Needs close follow-up with his endocrinologist, Dr. 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  Patient is on 712 long-acting insulin and 5 units with meals, reported that his blood sugars have been especially high over the last few days.  Patient reported that he developed progressively worsening nausea and vomiting in the last 24 hours, unable to hold anything down  In ED, sodium 127, potassium 6.2, bicarb 16, glucose 1061, creatinine 8.75 Patient was admitted for further work-up   Assessment and Plan:  Diabetic ketoacidosis in the setting of poorly controlled diabetes mellitus AG metabolic acidosis, pseudohyponatremia, POA Hyperkalemia, POA  -Patient was started on insulin drip, now has been transitioned off to subcutaneous insulin -Hemoglobin A1c 11.2 -Patient's wife did mention on compliance, dietary indiscretion and underlying depression since he got started on hemodialysis -CBGs now improving, counseled  on compliance, taking insulin as instructed  Diabetes mellitus type 1, IDDM, poorly controlled with hyperglycemia, DKA, ESRD -Hemoglobin A1c 11.2 -Diabetic coordinator consult was obtained, increased basal insulin to 10 units at bedtime Continue Humalog 5 units 3 times daily before meals and started on correction factor sliding scale per instructions      ESRD -On hemodialysis, TTS, nephrology consulted -Underwent hemodialysis on 10/10 -Will need his regular HD tomorrow per schedule  Hypertension - BP is noted to be somewhat soft, holding Norvasc until follow-up with his PCP -Continue hydralazine, labetalol  Anemia of chronic disease, ESRD -H&H stable, ESA per nephrology -Hemoglobin 10.3  Depression, anxiety -While patient has been feeling somewhat starting HD -Placed on Zoloft 50 mg daily, outpatient follow-up with PCP      Pain control - [facility] Controlled Substance Reporting System database was reviewed. and patient was instructed, not to drive, operate heavy machinery, perform activities at heights, swimming or participation in water activities or provide baby-sitting services while on Pain, Sleep and Anxiety Medications; until their outpatient Physician has advised to do so again. Also recommended to not to take more than prescribed Pain, Sleep and Anxiety Medications.  Consultants: Nephrology Procedures performed: HD Disposition: Home Diet recommendation:  Discharge Diet Orders (From admission, onward)     Start     Ordered   07/12/22 0000  Diet Carb Modified        07/12/22 1233           Carb modified diet DISCHARGE MEDICATION: Allergies as of 07/12/2022       Reactions   Fexofenadine Rash   Latex Rash        Medication List     STOP  taking these medications    hydrochlorothiazide 25 MG tablet Commonly known as: HYDRODIURIL       TAKE these medications    amLODipine 10 MG tablet Commonly known as: NORVASC Take 1 tablet (10 mg  total) by mouth every morning. HOLD until follow-up with your doctor What changed: additional instructions   aspirin EC 81 MG tablet Take 1 tablet (81 mg total) by mouth daily. Swallow whole. What changed: when to take this   atorvastatin 80 MG tablet Commonly known as: LIPITOR Take 1 tablet (80 mg total) by mouth daily. What changed: when to take this   Baqsimi One Pack 3 MG/DOSE Powd Generic drug: Glucagon Place 3 mg into the nose once as needed (low blood sugar).   clopidogrel 75 MG tablet Commonly known as: PLAVIX Take 75 mg by mouth every morning.   FreeStyle Libre 2 Reader [employer] 1 box Diagnosis: Diabetes mellitus type 1, IDDM What changed:  how to take this additional instructions   FreeStyle Libre 2 Sensor Misc Diagnosis: Diabetes mellitus type 1, IDDM What changed:  how to take this additional instructions   HumaLOG KwikPen 100 UNIT/ML KwikPen Generic drug: insulin lispro Inject 5 Units into the skin 3 (three) times daily. What changed: Another medication with the same name was added. Make sure you understand how and when to take each.   insulin lispro 100 UNIT/ML KwikPen Commonly known as: HUMALOG CORRECTION FACTOR: Sliding scale CBG 70 - 120: 0 units CBG 121 - 150: 0 unit,  CBG 151 - 200: 1 units,  CBG 201 - 250: 2 units,  CBG 251 - 300: 2 units,  CBG 301 - 350: 4 units,  CBG 351 - 400: 5 units   CBG > 400: 6 units and notify your MD What changed: You were already taking a medication with the same name, and this prescription was added. Make sure you understand how and when to take each.   hydrALAZINE 50 MG tablet Commonly known as: APRESOLINE Take 1 tablet (50 mg total) by mouth every 8 (eight) hours.   insulin degludec 100 UNIT/ML FlexTouch Pen Commonly known as: TRESIBA Inject 10 Units into the skin at bedtime. What changed: how much to take   Insulin Pen Needle 32G X 4 MM Misc Use with lantus and humalog 4 imes per day   labetalol 200 MG  tablet Commonly known as: NORMODYNE Take 1 tablet (200 mg total) by mouth 2 (two) times daily.   ondansetron 4 MG disintegrating tablet Commonly known as: ZOFRAN-ODT Take 1 tablet (4 mg total) by mouth every 8 (eight) hours as needed for nausea or vomiting.   ONE TOUCH LANCETS Misc Use to check blood sugar 8 time(s) daily   OneTouch Verio Flex System w/Device Kit by Does not apply route.   OneTouch Verio test strip Generic drug: glucose blood SMARTSIG:Via Meter 8 Times Daily   pantoprazole 40 MG tablet Commonly known as: Protonix Take 1 tablet (40 mg total) by mouth daily before breakfast. What changed: when to take this   Retacrit 10000 UNIT/ML injection Generic drug: epoetin alfa-epbx Inject 10,000 Units into the vein every 30 (thirty) days.   sertraline 50 MG tablet Commonly known as: ZOLOFT Take 1 tablet (50 mg total) by mouth daily.   sevelamer carbonate 800 MG tablet Commonly known as: RENVELA Take 800 mg by mouth 3 (three) times daily.   Vitamin D 50 MCG (2000 UT) tablet Take 2,000 Units by mouth daily.  Follow-up Information     Bernerd Limbo, MD. Schedule an appointment as soon as possible for a visit in 5 day(s).   Specialty: Family Medicine Why: for hospital follow-up Contact information: [address] [place] [phone]         Gerome Apley, MD. Schedule an appointment as soon as possible for a visit in 10 day(s).   Specialty: Endocrinology Why: for hospital follow-up for diabetes Contact information: [place] [phone]                Discharge Exam: Danley Danker Weights   07/12/22 0230 07/12/22 0245 07/12/22 0635  Weight: 77.4 kg 77.4 kg 75 kg   S: No acute complaints, however felt dizzy with physical therapy in the morning.  CBGs better controlled wife at the bedside  Vitals:   07/12/22 0624 07/12/22 0635 07/12/22 0807 07/12/22 1200  BP:  133/71 133/71 134/62 114/60  Pulse: 77 79 81 77  Resp: _0 Temp:  98.2 F (36.8 C) 98.8 F (37.1 C) 99.6 F (37.6 C)  TempSrc: Oral  Oral Oral  SpO2: 100% 100% 98% 96%  Weight:  75 kg      Physical Exam General: Alert and oriented x 3, NAD Cardiovascular: S1 S2 clear, RRR.  Respiratory: CTAB, no wheezing Gastrointestinal: Soft, nontender, nondistended, NBS Ext: no pedal edema bilaterally Neuro: no new deficits Psych: Normal affect and demeanor     Condition at discharge: fair  The results of significant diagnostics from this hospitalization (including imaging, microbiology, ancillary and laboratory) are listed below for reference.   Imaging Studies: DG Chest 1 View  Result Date: 07/10/2022 CLINICAL DATA:  Hyperglycemia.  Vomiting since yesterday. EXAM: CHEST  1 VIEW COMPARISON:  06/05/2022 FINDINGS: Normal heart size and pulmonary vascularity. No focal airspace disease or consolidation in the lungs. No blunting of costophrenic angles. No pneumothorax. Mediastinal contours appear intact. Calcification of the aorta. Old left rib fractures. IMPRESSION: No active disease. Electronically Signed   By: [name] M.D.   On: 07/10/2022 23:20    Microbiology: Results for orders placed or performed during the hospital encounter of 06/05/22  Resp Panel by RT-PCR (Flu A&B, Covid) Anterior Nasal Swab     Status: None   Collection Time: 06/05/22 10:08 AM   Specimen: Anterior Nasal Swab  Result Value Ref Range Status   SARS Coronavirus 2 by RT PCR NEGATIVE NEGATIVE Final    Comment: (NOTE) SARS-CoV-2 target nucleic acids are NOT DETECTED.  The SARS-CoV-2 RNA is generally detectable in upper respiratory specimens during the acute phase of infection. The lowest concentration of SARS-CoV-2 viral copies this assay can detect is 138 copies/mL. A negative result does not preclude SARS-Cov-2 infection and should not be used as the sole basis for treatment or other patient  management decisions. A negative result may occur with  improper specimen collection/handling, submission of specimen other than nasopharyngeal swab, presence of viral mutation(s) within the areas targeted by this assay, and inadequate number of viral copies(<138 copies/mL). A negative result must be combined with clinical observations, patient history, and epidemiological information. The expected result is Negative.  Fact Sheet for Patients:  EntrepreneurPulse.com.au  Fact Sheet for Healthcare Providers:  IncredibleEmployment.be  This test is no t yet approved or cleared by the [place] FDA and  has been authorized for detection and/or diagnosis of SARS-CoV-2 by FDA under an Emergency Use Authorization (EUA). This EUA will  remain  in effect (meaning this test can be used) for the duration of the COVID-19 declaration under Section 564(b)(1) of the Act, 21 U.S.C.section 360bbb-3(b)(1), unless the authorization is terminated  or revoked sooner.       Influenza A by PCR NEGATIVE NEGATIVE Final   Influenza B by PCR NEGATIVE NEGATIVE Final    Comment: (NOTE) The Xpert Xpress SARS-CoV-2/FLU/RSV plus assay is intended as an aid in the diagnosis of influenza from Nasopharyngeal swab specimens and should not be used as a sole basis for treatment. Nasal washings and aspirates are unacceptable for Xpert Xpress SARS-CoV-2/FLU/RSV testing.  Fact Sheet for Patients: EntrepreneurPulse.com.au  Fact Sheet for Healthcare Providers: IncredibleEmployment.be  This test is not yet approved or cleared by the [place] FDA and has been authorized for detection and/or diagnosis of SARS-CoV-2 by FDA under an Emergency Use Authorization (EUA). This EUA will remain in effect (meaning this test can be used) for the duration of the COVID-19 declaration under Section 564(b)(1) of the Act, 21 U.S.C. section 360bbb-3(b)(1),  unless the authorization is terminated or revoked.  Performed at [facility], [place] [address], [place], [place] [address]     Labs: CBC: Recent Labs  Lab 07/10/22 2308 07/10/22 2320 07/11/22 0613 07/11/22 1144 07/12/22 0758  WBC 11.1*  --  10.6* 10.6* 10.6*  NEUTROABS 9.7*  --   --   --   --   HGB 11.2* 12.9* 9.7* 9.8* 10.3*  HCT 36.9* 38.0* 28.5* 29.2* 30.6*  MCV 94.9  --  87.7 86.9 86.4  PLT 346  --  322 361 025   Basic Metabolic Panel: Recent Labs  Lab 07/10/22 2308 07/10/22 2320 07/11/22 0613 07/11/22 1144 07/11/22 1535 07/12/22 0252 07/12/22 0758  NA 127* 123*  --  137 139 139 136  K 6.2* 6.1*  --  4.0 3.6 3.6 3.7  CL 84*  --   --  92* 94* 94* 93*  CO2 16*  --   --  _0 GLUCOSE 1,061*  --   --  385* 142* 217* 186*  BUN 78*  --   --  84* 81* 83* 40*  CREATININE 8.75*  --  8.86* 8.61* 8.66* 8.76* 5.39*  CALCIUM 8.6*  --   --  8.6* 8.5* 8.7* 8.8*  MG 2.3  --   --   --   --   --   --   PHOS  --   --   --  6.4*  --   --   --    Liver Function Tests: Recent Labs  Lab 07/10/22 2308  AST 22  ALT 19  ALKPHOS 84  BILITOT 2.0*  PROT 6.3*  ALBUMIN 3.6   CBG: Recent Labs  Lab 07/11/22 1737 07/11/22 1931 07/11/22 2341 07/12/22 0805 07/12/22 1209  GLUCAP 97 196* 265* 202* 297*    Discharge time spent: greater than 30 minutes.  Signed: Estill Cotta, MD [facility] 07/12/2022

## 2022-07-12 NOTE — Inpatient Diabetes Management (Signed)
Inpatient Diabetes Program Recommendations  AACE/ADA: New Consensus Statement on Inpatient Glycemic Control  Target Ranges:  Prepandial:   less than 140 mg/dL      Peak postprandial:   less than 180 mg/dL (1-2 hours)      Critically ill patients:  140 - 180 mg/dL    Latest Reference Range & Units 07/11/22 12:58 07/11/22 14:20 07/11/22 15:37 07/11/22 16:37 07/11/22 17:37 07/11/22 19:31 07/11/22 23:41 07/12/22 08:05  Glucose-Capillary 70 - 99 mg/dL 298 (H) 200 (H) 144 (H) 118 (H) 97 196 (H) 265 (H) 202 (H)  (H): Data is abnormally high  Latest Reference Range & Units 07/10/22 23:08 07/11/22 06:13 07/11/22 11:44 07/11/22 15:35 07/12/22 02:52 07/12/22 07:58  CO2 22 - 32 mmol/L 16 (L)  30 29 28 30   Glucose 70 - 99 mg/dL 1,061 (HH)  385 (H) 142 (H) 217 (H) 186 (H)  Anion gap 5 - 15  27 (H)  15 16 (H) 17 (H) 13    Latest Reference Range & Units 07/11/22 06:13  Hemoglobin A1C 4.8 - 5.6 % 11.2 (H)   Review of Glycemic Control  Diabetes history: DM1 (does NOT make any insulin; requires basal, carb coverage, and correction insulin) Outpatient Diabetes medications: Tresiba 8 units daily, Humalog 5 units TID with meals plus Humalog 1-2 units if needed for hyperglycemia Current orders for Inpatient glycemic control: Semglee 10 units daily, Novolog 3 units TID with meals, Novolog 0-6 units TID with meals, Novolog 0-5 units QHS  Inpatient Diabetes Program Recommendations:    HbgA1C:  A1C 11.2% on 07/11/22 indicating an average glucose of 275 mg/dl over the past 2-3 months. Prior A1C was 8.1% on 02/15/22 (in [place]).  Outpatient: At time of discharge, please provide Rx for [facility] 2 reader [phone]), FreeStyle Libre2 sensors [phone]), and Tyler Aas [phone]).  NOTE: Spoke with patient at bedside regarding DM control and A1C. Patient reports that he consistently takes [place] 8 units and Humalog 5 units with meals plus 1-2 additional units for correction if needed (not able to provide any  specific way he decides whether to take 1 or 2 units for correction).  Patient reports that glucose is typically high 200-300's but it does go up and read "HI" on glucometer frequently. Patient also notes he has issues with hypoglycemia as well (sometimes during night and other times during the day). Patient sees Dr. Hartford Poli (Endocrinologist) and noted in chart that patient last seen 02/15/22. Per office note by Dr. Hartford Poli on 02/15/22, glucose is extremely erratic due to missing Humalog doses with meals then overdosing for high glucose. Noted patient was asked to contact [employer] about getting replacement FreeStyle Libre2 reader. Patient states that he still has not been able to get FreeStyle Libre2 reader replaced; he had been given one by Dr. Shirlyn Goltz office and it broke after 2 months and her prefers to use the reader versus cell phone app. Informed patient that it would be requested that he receive Rx for FreeStyle Libre2 reader and sensors at discharge. Discussed current A1C of 11.2% and stressed importance of improving A1C and overall glucose trends to decrease risk of further complications from uncontrolled DM. Reviewed glucose and A1C goals. Patient states he has been on dialysis for 3-4 months and glucose is usually lower on dialysis days and many times he goes low after his treatment. Discussed hypoglycemia and importance of treating correctly. Encouraged patient to get back on FreeStyle Libre2 CGM as it will alarm when glucose is less than 70 or over  240 mg/dl. Also encouraged patient to follow up with Dr. Hartford Poli. Patient reports he is almost out of [place] and he needs a refill; will ask that Rx be provided for [place] at discharge. Patient verbalized understanding of information discussed and states he has no questions at this time.  Thanks, Louis Alderman, RN, MSN, [place] Diabetes Coordinator Inpatient Diabetes Program [phone] (Team Pager from 8am to [place])

## 2022-07-12 NOTE — Evaluation (Signed)
Physical Therapy Evaluation Patient Details Name: Louis Peters MRN: [id number] DOB: 07/01/1966 Today's Date: 07/12/2022  History of Present Illness  Pt is a 56 y/o male admitted secondary to hyperglycemia. PMH includes ESRD on HD TTS and T1DM.  Clinical Impression  Pt admitted secondary to problem above with deficits below. Pt initially tolerated mobility well, but in the middle of gait, became very unsteady and required assist to maintain balance. Asked pt if he felt ok, and pt reports he felt fine, but also had glazed over look on face. Had pt return to sitting in bed and checked BP. BP at 82/68. Had pt return to supine and BP at 101/55. RN and MD notified. Anticipate pt will progress well as BP stabilizes. Will continue to follow acutely.      Recommendations for follow up therapy are one component of a multi-disciplinary discharge planning process, led by the attending physician.  Recommendations may be updated based on patient status, additional functional criteria and insurance authorization.  Follow Up Recommendations No PT follow up (pending progression)      Assistance Recommended at Discharge Intermittent Supervision/Assistance  Patient can return home with the following  A little help with walking and/or transfers;A little help with bathing/dressing/bathroom;Help with stairs or ramp for entrance;Assist for transportation;Assistance with cooking/housework    Equipment Recommendations Other (comment) (TBD pending progression)  Recommendations for Other Services       Functional Status Assessment Patient has had a recent decline in their functional status and demonstrates the ability to make significant improvements in function in a reasonable and predictable amount of time.     Precautions / Restrictions Precautions Precautions: Fall Restrictions Weight Bearing Restrictions: No      Mobility  Bed Mobility Overal bed mobility: Independent                   Transfers Overall transfer level: Independent                      Ambulation/Gait Ambulation/Gait assistance: Supervision, Min assist Gait Distance (Feet): 150 Feet Assistive device: None Gait Pattern/deviations: Step-through pattern, Decreased stride length, Staggering left, Staggering right Gait velocity: Decreased     General Gait Details: Pt initially at a supervision level, but in the middle of gait, pt began to stagger and requiring min A for steadying. Asked pt if he felt ok, and pt reports he felt fine, but also had glazed over look on face. Had pt return to sitting in bed and checked BP. BP at 82/68. Had pt return to supine and BP at 101/55  Stairs            Wheelchair Mobility    Modified [name] (Stroke Patients Only)       Balance Overall balance assessment: Needs assistance Sitting-balance support: No upper extremity supported, Feet supported Sitting balance-Leahy Scale: Good     Standing balance support: No upper extremity supported Standing balance-Leahy Scale: Poor Standing balance comment: Initially good, but progressed to poor with progressive ambulation                             Pertinent Vitals/Pain Pain Assessment Pain Assessment: No/denies pain    Home Living Family/patient expects to be discharged to:: Private residence Living Arrangements: Spouse/significant other;Children Available Help at Discharge: Family Type of Home: House Home Access: Stairs to enter Entrance Stairs-Rails: Right;Left;Can reach both Entrance Stairs-Number of Steps: 5   Home Layout:  Two level;Able to live on main level with bedroom/bathroom Home Equipment: None      Prior Function Prior Level of Function : Independent/Modified Independent                     Hand Dominance        Extremity/Trunk Assessment   Upper Extremity Assessment Upper Extremity Assessment: Overall WFL for tasks assessed    Lower Extremity  Assessment Lower Extremity Assessment: Generalized weakness    Cervical / Trunk Assessment Cervical / Trunk Assessment: Normal  Communication   Communication: No difficulties  Cognition Arousal/Alertness: Awake/alert Behavior During Therapy: WFL for tasks assessed/performed Overall Cognitive Status: Within Functional Limits for tasks assessed                                          General Comments      Exercises     Assessment/Plan    PT Assessment Patient needs continued PT services  PT Problem List Decreased strength;Decreased activity tolerance;Decreased balance;Decreased mobility;Decreased knowledge of precautions;Cardiopulmonary status limiting activity       PT Treatment Interventions DME instruction;Gait training;Stair training;Functional mobility training;Therapeutic activities;Therapeutic exercise;Balance training;Patient/family education    PT Goals (Current goals can be found in the Care Plan section)  Acute Rehab PT Goals Patient Stated Goal: to go home PT Goal Formulation: With patient Time For Goal Achievement: 07/26/22 Potential to Achieve Goals: Good    Frequency Min 3X/week     Co-evaluation               AM-PAC PT "6 Clicks" Mobility  Outcome Measure Help needed turning from your back to your side while in a flat bed without using bedrails?: None Help needed moving from lying on your back to sitting on the side of a flat bed without using bedrails?: None Help needed moving to and from a bed to a chair (including a wheelchair)?: None Help needed standing up from a chair using your arms (e.g., wheelchair or bedside chair)?: None Help needed to walk in hospital room?: A Little Help needed climbing 3-5 steps with a railing? : A Lot 6 Click Score: 21    End of Session   Activity Tolerance: Treatment limited secondary to medical complications (Comment) (low BP) Patient left: in bed;with call bell/phone within reach Nurse  Communication: Mobility status PT Visit Diagnosis: Unsteadiness on feet (R26.81);Muscle weakness (generalized) (M62.81)    Time: 0160-1093 PT Time Calculation (min) (ACUTE ONLY): 21 min   Charges:   PT Evaluation $PT Eval Moderate Complexity: 1 Mod          Louis Peters, PT, DPT  Acute Rehabilitation Services  Office: [phone]   [name] 07/12/2022, 2:19 PM

## 2022-07-12 NOTE — Progress Notes (Signed)
Pt to d/c to home today. Pt receives out-pt HD at [facility]) on TTS 11:35 chair time. Advised by renal NP that pt's wife voiced concerns about pt making HD appt tomorrow. Spoke to pt's wife via phone. Wife states she has contacted [facility] to schedule pt's transportation to HD appt tomorrow. Contacted [facility] to advise staff of pt's d/c today and that pt will resume care tomorrow.   Melven Sartorius Renal Navigator [phone]

## 2022-07-12 NOTE — TOC Progression Note (Signed)
Transition of Care [facility]) - Progression Note    Patient Details  Name: Louis Peters MRN: [id number] Date of Birth: 04/11/1966  Transition of Care [facility]) CM/SW [place], RN Phone Number: 07/12/2022, 1:47 PM  Clinical Narrative:     Spoke to patient's wife regarding transition needs. Wife states she has arranged [facility] for transportation. Patient can afford his medications.   No TOC needs at this time.  Address, Phone number and PCP verified.  Expected Discharge Plan: Home/Self Care Barriers to Discharge: Barriers Resolved  Expected Discharge Plan and Services Expected Discharge Plan: Home/Self Care       Living arrangements for the past 2 months: Single Family Home Expected Discharge Date: 07/12/22                                     Social Determinants of Health (SDOH) Interventions    Readmission Risk Interventions    07/12/2022    1:22 PM  Readmission Risk Prevention Plan  Transportation Screening Complete  Medication Review Press photographer) Complete  PCP or Specialist appointment within 3-5 days of discharge Complete  HRI or [place] Complete  SW Recovery Care/Counseling Consult Complete  [place] Not Applicable

## 2022-07-12 NOTE — Plan of Care (Signed)
  Problem: Health Behavior/Discharge Planning: Goal: Ability to manage health-related needs will improve Outcome: Progressing   

## 2022-07-12 NOTE — Plan of Care (Signed)

## 2022-07-12 NOTE — Progress Notes (Signed)
   07/12/22 0635  Vitals  Temp 98.2 F (36.8 C)  BP 133/71  Pulse Rate 79  Resp 12  Post Treatment  Dialyzer Clearance Heavily streaked  Duration of HD Treatment -hour(s) 3.5 hour(s)  Liters Processed 77.9  Fluid Removed 2400 mL  Tolerated HD Treatment Yes  AVG/AVF Arterial Site Held (minutes) 15 minutes  AVG/AVF Venous Site Held (minutes) 15 minutes   TX fin. W/o difficulty.

## 2022-07-13 DIAGNOSIS — R739 Hyperglycemia, unspecified: Secondary | ICD-10-CM | POA: Diagnosis not present

## 2022-07-13 LAB — GLUCOSE, CAPILLARY
Glucose-Capillary: 342 mg/dL — ABNORMAL HIGH (ref 70–99)
Glucose-Capillary: 276 mg/dL — ABNORMAL HIGH (ref 70–99)
Glucose-Capillary: 124 mg/dL — ABNORMAL HIGH (ref 70–99)
Glucose-Capillary: 160 mg/dL — ABNORMAL HIGH (ref 70–99)

## 2022-07-13 MED ORDER — INSULIN ASPART 100 UNIT/ML IJ SOLN
0.0000 [IU] | INTRAMUSCULAR | Status: DC
  Administered 2022-07-13: 1 [IU] via SUBCUTANEOUS
  Administered 2022-07-13 – 2022-07-14 (×2): 3 [IU] via SUBCUTANEOUS
  Administered 2022-07-14: 4 [IU] via SUBCUTANEOUS
  Administered 2022-07-14: 2 [IU] via SUBCUTANEOUS
  Administered 2022-07-14: 1 [IU] via SUBCUTANEOUS
  Administered 2022-07-15: 2 [IU] via SUBCUTANEOUS
  Administered 2022-07-15 (×3): 3 [IU] via SUBCUTANEOUS
  Administered 2022-07-16: 10 [IU] via SUBCUTANEOUS
  Administered 2022-07-16: 5 [IU] via SUBCUTANEOUS
  Administered 2022-07-17: 4 [IU] via SUBCUTANEOUS
  Administered 2022-07-17: 1 [IU] via SUBCUTANEOUS
  Administered 2022-07-17 (×2): 3 [IU] via SUBCUTANEOUS

## 2022-07-13 MED ORDER — INSULIN ASPART 100 UNIT/ML IJ SOLN: 6.0000 [IU] | Freq: Three times a day (TID) | INTRAMUSCULAR | Status: DC

## 2022-07-13 MED ORDER — LIDOCAINE HCL (PF) 1 % IJ SOLN
5.0000 mL | INTRAMUSCULAR | Status: DC | PRN
  Filled 2022-07-13: qty 5

## 2022-07-13 MED ORDER — PENTAFLUOROPROP-TETRAFLUOROETH EX AERO: 1.0000 | INHALATION_SPRAY | CUTANEOUS | Status: DC | PRN

## 2022-07-13 MED ORDER — CHLORHEXIDINE GLUCONATE CLOTH 2 % EX PADS
6.0000 | MEDICATED_PAD | Freq: Every day | CUTANEOUS | Status: DC
  Administered 2022-07-13 – 2022-07-17 (×5): 6 via TOPICAL

## 2022-07-13 MED ORDER — ALTEPLASE 2 MG IJ SOLR: 2.0000 mg | Freq: Once | INTRAMUSCULAR | Status: DC | PRN

## 2022-07-13 MED ORDER — LIDOCAINE-PRILOCAINE 2.5-2.5 % EX CREA
1.0000 | TOPICAL_CREAM | CUTANEOUS | Status: DC | PRN
  Filled 2022-07-13: qty 5

## 2022-07-13 MED ORDER — METOCLOPRAMIDE HCL 5 MG/ML IJ SOLN
5.0000 mg | Freq: Once | INTRAMUSCULAR | Status: AC
  Administered 2022-07-13: 5 mg via INTRAVENOUS
  Filled 2022-07-13: qty 2

## 2022-07-13 MED ORDER — HYDRALAZINE HCL 10 MG PO TABS
10.0000 mg | ORAL_TABLET | Freq: Two times a day (BID) | ORAL | Status: DC
  Administered 2022-07-14: 10 mg via ORAL
  Filled 2022-07-13: qty 1

## 2022-07-13 MED ORDER — PROCHLORPERAZINE EDISYLATE 10 MG/2ML IJ SOLN: 10.0000 mg | Freq: Four times a day (QID) | INTRAMUSCULAR | Status: DC | PRN

## 2022-07-13 MED ORDER — INSULIN GLARGINE-YFGN 100 UNIT/ML ~~LOC~~ SOLN
15.0000 [IU] | Freq: Every day | SUBCUTANEOUS | Status: DC
  Administered 2022-07-14: 15 [IU] via SUBCUTANEOUS
  Filled 2022-07-13 (×2): qty 0.15

## 2022-07-13 MED ORDER — HEPARIN SODIUM (PORCINE) 1000 UNIT/ML DIALYSIS: 1000.0000 [IU] | INTRAMUSCULAR | Status: DC | PRN

## 2022-07-13 MED ORDER — HYDRALAZINE HCL 10 MG PO TABS
10.0000 mg | ORAL_TABLET | Freq: Three times a day (TID) | ORAL | Status: DC
  Filled 2022-07-13: qty 1

## 2022-07-13 MED ORDER — LABETALOL HCL 100 MG PO TABS
100.0000 mg | ORAL_TABLET | Freq: Two times a day (BID) | ORAL | Status: DC
  Administered 2022-07-14 (×2): 100 mg via ORAL
  Filled 2022-07-13 (×5): qty 1

## 2022-07-13 NOTE — Plan of Care (Signed)
Late entry note:   DC held yesterday 10/11 as patient became hypotensive, orthostatic and near syncopal with PT. BP meds were held. He was monitored and patient requested to stay overnight.    Estill Cotta M.D.  [facility] 07/13/2022, 7:19 AM

## 2022-07-13 NOTE — Progress Notes (Signed)
Await orthostatics by nursing and ambulation. Eulogio Bear DO

## 2022-07-13 NOTE — Plan of Care (Signed)
  Problem: Metabolic: Goal: Ability to maintain appropriate glucose levels will improve Outcome: Progressing   Problem: Cardiac: Goal: Ability to maintain an adequate cardiac output will improve Outcome: Progressing

## 2022-07-13 NOTE — Progress Notes (Addendum)
PROGRESS NOTE    Louis Peters  TOI:[id number] DOB: 1966/02/05 DOA: 07/10/2022 PCP: Bernerd Limbo, MD    Brief Narrative:  Briefly 56 year old male with ESRD on HD TTS, diabetes mellitus type 1, HTN, HLP presented with nausea vomiting and epigastric abdominal pain.  Patient is on long-acting insulin and 5 units with meals, reported that his blood sugars have been especially high over the last few days.  Patient reported that he developed progressively worsening nausea and vomiting in the last 24 hours, unable to hold anything down   Assessment and Plan: Diabetic ketoacidosis in the setting of poorly controlled diabetes mellitus type 1 AG metabolic acidosis, pseudohyponatremia, POA Hyperkalemia, POA  -changed from insulin gtt to SQ insulin with continue poor control- adjust insulin -change SSI to q 4 since not eating/vomiting -Last hemoglobin A1c 10.7 on 10/14/2021  Dizziness/orthostatics -stopped and decreased BP meds -recheck orthos in AM  N/V -zofran PRN, compazine for refractory vomiting -reglan x 1 -full liquid diet  ESRD -On hemodialysis, TTS, nephrology consulted -Needs HD again today   Anemia of chronic disease, ESRD -H&H stable, ESA per nephrology   Depression, anxiety -While patient has been feeling somewhat starting HD -Placed on Zoloft 50 mg daily, outpatient follow-up with PCP  DVT prophylaxis: heparin injection 5,000 Units Start: 07/11/22 1400 SCDs Start: 07/11/22 0425    Code Status: Full Code   Disposition Plan:  Level of care: Telemetry Medical Status is: Inpatient Remains inpatient appropriate because: vomiting    Consultants:  renal   Subjective: Nursing reports vomiting  Objective: Vitals:   07/12/22 2112 07/12/22 2320 07/13/22 0300 07/13/22 0730  BP: (!) 134/55 (!) 119/50 (!) 133/54 (!) 134/59  Pulse:  75 74 78  Resp:  20 12 16   Temp:  98.4 F (36.9 C) 98.8 F (37.1 C) 99.1 F (37.3 C)  TempSrc:  Oral Oral Oral  SpO2:  97% 97%  97%  Weight:        Intake/Output Summary (Last 24 hours) at 07/13/2022 1050 Last data filed at 07/12/2022 2300 Gross per 24 hour  Intake --  Output 100 ml  Net -100 ml   Filed Weights   07/12/22 0230 07/12/22 0245 07/12/22 0635  Weight: 77.4 kg 77.4 kg 75 kg    Examination:   General: Appearance:    Chronically ill appearing male in no acute distress     Lungs:     respirations unlabored  Heart:    Normal heart rate.    MS:   All extremities are intact.    Neurologic:   Awake, alert       Data Reviewed: I have personally reviewed following labs and imaging studies  CBC: Recent Labs  Lab 07/10/22 2308 07/10/22 2320 07/11/22 0613 07/11/22 1144 07/12/22 0758  WBC 11.1*  --  10.6* 10.6* 10.6*  NEUTROABS 9.7*  --   --   --   --   HGB 11.2* 12.9* 9.7* 9.8* 10.3*  HCT 36.9* 38.0* 28.5* 29.2* 30.6*  MCV 94.9  --  87.7 86.9 86.4  PLT 346  --  322 361 833   Basic Metabolic Panel: Recent Labs  Lab 07/10/22 2308 07/10/22 2320 07/11/22 0613 07/11/22 1144 07/11/22 1535 07/12/22 0252 07/12/22 0758  NA 127* 123*  --  137 139 139 136  K 6.2* 6.1*  --  4.0 3.6 3.6 3.7  CL 84*  --   --  92* 94* 94* 93*  CO2 16*  --   --  30 29  28 30  GLUCOSE 1,061*  --   --  385* 142* 217* 186*  BUN 78*  --   --  84* 81* 83* 40*  CREATININE 8.75*  --  8.86* 8.61* 8.66* 8.76* 5.39*  CALCIUM 8.6*  --   --  8.6* 8.5* 8.7* 8.8*  MG 2.3  --   --   --   --   --   --   PHOS  --   --   --  6.4*  --   --   --    GFR: Estimated Creatinine Clearance: 15.6 mL/min (A) (by C-G formula based on SCr of 5.39 mg/dL (H)). Liver Function Tests: Recent Labs  Lab 07/10/22 2308  AST 22  ALT 19  ALKPHOS 84  BILITOT 2.0*  PROT 6.3*  ALBUMIN 3.6   Recent Labs  Lab 07/10/22 2308  LIPASE 39   No results for input(s): "AMMONIA" in the last 168 hours. Coagulation Profile: No results for input(s): "INR", "PROTIME" in the last 168 hours. Cardiac Enzymes: No results for input(s): "CKTOTAL",  "CKMB", "CKMBINDEX", "TROPONINI" in the last 168 hours. BNP (last 3 results) No results for input(s): "PROBNP" in the last 8760 hours. HbA1C: Recent Labs    07/11/22 0613  HGBA1C 11.2*   CBG: Recent Labs  Lab 07/12/22 0805 07/12/22 1209 07/12/22 1536 07/12/22 2107 07/13/22 0810  GLUCAP 202* 297* 231* 320* 342*   Lipid Profile: No results for input(s): "CHOL", "HDL", "LDLCALC", "TRIG", "CHOLHDL", "LDLDIRECT" in the last 72 hours. Thyroid Function Tests: No results for input(s): "TSH", "T4TOTAL", "FREET4", "T3FREE", "THYROIDAB" in the last 72 hours. Anemia Panel: No results for input(s): "VITAMINB12", "FOLATE", "FERRITIN", "TIBC", "IRON", "RETICCTPCT" in the last 72 hours. Sepsis Labs: No results for input(s): "PROCALCITON", "LATICACIDVEN" in the last 168 hours.  No results found for this or any previous visit (from the past 240 hour(s)).       Radiology Studies: No results found.      Scheduled Meds:  aspirin EC  81 mg Oral q morning   atorvastatin  80 mg Oral Daily   calcitRIOL  0.5 mcg Oral Q T,Th,Sa-HD   Chlorhexidine Gluconate Cloth  6 each Topical Q0600   clopidogrel  75 mg Oral q morning   darbepoetin (ARANESP) injection - DIALYSIS  100 mcg Intravenous Q Thu-HD   heparin  5,000 Units Subcutaneous Q8H   hydrALAZINE  10 mg Oral Q8H   insulin aspart  0-5 Units Subcutaneous QHS   insulin aspart  0-6 Units Subcutaneous TID WC   insulin aspart  6 Units Subcutaneous TID WC   insulin glargine-yfgn  10 Units Subcutaneous Daily   labetalol  100 mg Oral BID   metoCLOPramide (REGLAN) injection  5 mg Intravenous Once   sertraline  50 mg Oral Daily   sevelamer carbonate  800 mg Oral TID WC   Continuous Infusions:   LOS: 2 days    Time spent: 45 minutes spent on chart review, discussion with nursing staff, consultants, updating family and interview/physical exam; more than 50% of that time was spent in counseling and/or coordination of care.    Geradine Girt, DO [facility] Available via Epic secure chat 7am-7pm After these hours, please refer to coverage provider listed on [email or web address] 07/13/2022, 10:50 AM

## 2022-07-13 NOTE — Progress Notes (Signed)
Patient has arrived to the unit.

## 2022-07-13 NOTE — Inpatient Diabetes Management (Signed)
Inpatient Diabetes Program Recommendations  AACE/ADA: New Consensus Statement on Inpatient Glycemic Control  Target Ranges:  Prepandial:   less than 140 mg/dL      Peak postprandial:   less than 180 mg/dL (1-2 hours)      Critically ill patients:  140 - 180 mg/dL    Latest Reference Range & Units 07/12/22 08:05 07/12/22 12:09 07/12/22 15:36 07/12/22 21:07  Glucose-Capillary 70 - 99 mg/dL 202 (H) 297 (H) 231 (H) 320 (H)   Review of Glycemic Control  Diabetes history: DM1 (does NOT make any insulin; requires basal, carb coverage, and correction insulin) Outpatient Diabetes medications: Tresiba 8 units daily, Humalog 5 units TID with meals plus Humalog 1-2 units if needed for hyperglycemia Current orders for Inpatient glycemic control: Semglee 10 units daily, Novolog 3 units TID with meals, Novolog 0-6 units TID with meals, Novolog 0-5 units QHS   Inpatient Diabetes Program Recommendations:     Insulin: Please consider increasing meal coverage to Novolog 6 units TID with meals.  HbgA1C:  A1C 11.2% on 07/11/22 indicating an average glucose of 275 mg/dl over the past 2-3 months. Prior A1C was 8.1% on 02/15/22 (in [place]).   Outpatient: At time of discharge, please provide Rx for [facility] 2 reader [phone]), FreeStyle Libre2 sensors [phone]), and Tyler Aas [phone]).   Thanks, Barnie Alderman, RN, MSN, [place] Diabetes Coordinator Inpatient Diabetes Program [phone] (Team Pager from 8am to [place])

## 2022-07-13 NOTE — Progress Notes (Signed)
[facility] Progress Note   Subjective:    Seen and examined patient at bedside. Discharge cancelled overnight d/t patient suffering from orthostatic hypotension during PT session yesterday-near syncope. Noted Amlodipine was stopped and Hydralazine and Labetalol were lowered. Seen laying in bed. Denies dizziness, SOB, CP, and N/V. Patient remains inpatient. HD scheduled for today.  Objective Vitals:   07/13/22 0300 07/13/22 0730 07/13/22 1222 07/13/22 1355  BP: (!) 133/54 (!) 134/59 (!) 114/50 (!) 154/71  Pulse: 74 78 77   Resp: 12 16 16    Temp: 98.8 F (37.1 C) 99.1 F (37.3 C) 98.8 F (37.1 C)   TempSrc: Oral Oral Oral   SpO2: 97% 97% 94%   Weight:       Physical Exam General: Awake, alert, NAD, on RA Heart: S1 and S2; No MRGs Lungs: Clear throughout; No wheezing, rales, or rhonchi Abdomen: Soft and non-tender Extremities: No LE edema Dialysis Access: RUE AVF (+) B/T  Filed Weights   07/12/22 0230 07/12/22 0245 07/12/22 0635  Weight: 77.4 kg 77.4 kg 75 kg    Intake/Output Summary (Last 24 hours) at 07/13/2022 1522 Last data filed at 07/12/2022 2300 Gross per 24 hour  Intake --  Output 100 ml  Net -100 ml    Additional Objective Labs: Basic Metabolic Panel: Recent Labs  Lab 07/11/22 1144 07/11/22 1535 07/12/22 0252 07/12/22 0758  NA 137 139 139 136  K 4.0 3.6 3.6 3.7  CL 92* 94* 94* 93*  CO2 30 29 28 30   GLUCOSE 385* 142* 217* 186*  BUN 84* 81* 83* 40*  CREATININE 8.61* 8.66* 8.76* 5.39*  CALCIUM 8.6* 8.5* 8.7* 8.8*  PHOS 6.4*  --   --   --    Liver Function Tests: Recent Labs  Lab 07/10/22 2308  AST 22  ALT 19  ALKPHOS 84  BILITOT 2.0*  PROT 6.3*  ALBUMIN 3.6   Recent Labs  Lab 07/10/22 2308  LIPASE 39   CBC: Recent Labs  Lab 07/10/22 2308 07/10/22 2320 07/11/22 0613 07/11/22 1144 07/12/22 0758  WBC 11.1*  --  10.6* 10.6* 10.6*  NEUTROABS 9.7*  --   --   --   --   HGB 11.2*   < > 9.7* 9.8* 10.3*  HCT 36.9*   < >  28.5* 29.2* 30.6*  MCV 94.9  --  87.7 86.9 86.4  PLT 346  --  322 361 326   < > = values in this interval not displayed.   Blood Culture    Component Value Date/Time   SDES  01/02/2021 1210    BLOOD LEFT HAND Performed at [facility] [address], [place] [place], [place] [address]    [facility]  01/02/2021 1210    BOTTLES DRAWN AEROBIC AND ANAEROBIC Blood Culture adequate volume Performed at [facility], [address] [place], [place] [address]    CULT  01/02/2021 1210    NO GROWTH 5 DAYS Performed at [facility], [place] [address] [place], [place] [address]    REPTSTATUS 01/07/2021 FINAL 01/02/2021 1210    Cardiac Enzymes: No results for input(s): "CKTOTAL", "CKMB", "CKMBINDEX", "TROPONINI" in the last 168 hours. CBG: Recent Labs  Lab 07/12/22 1209 07/12/22 1536 07/12/22 2107 07/13/22 0810 07/13/22 1436  GLUCAP 297* 231* 320* 342* 276*   Iron Studies: No results for input(s): "IRON", "TIBC", "TRANSFERRIN", "FERRITIN" in the last 72 hours. Lab Results  Component Value Date   INR 0.9 07/22/2020  Studies/Results: No results found.  Medications:   aspirin EC  81 mg Oral q morning   atorvastatin  80 mg Oral Daily   calcitRIOL  0.5 mcg Oral Q T,Th,Sa-HD   Chlorhexidine Gluconate Cloth  6 each Topical Q0600   clopidogrel  75 mg Oral q morning   darbepoetin (ARANESP) injection - DIALYSIS  100 mcg Intravenous Q Thu-HD   heparin  5,000 Units Subcutaneous Q8H   hydrALAZINE  10 mg Oral BID   insulin aspart  0-6 Units Subcutaneous Q4H   [START ON 07/14/2022] insulin glargine-yfgn  15 Units Subcutaneous Daily   labetalol  100 mg Oral BID   sertraline  50 mg Oral Daily   sevelamer carbonate  800 mg Oral TID WC    Dialysis Orders: TTS [facility] 4h  400/1.5  75kg  2/2 bath  Hep none RUA AVF - last HD 10/7, post wt 76.1 - last Hb 10.4 on 10/5 - mircera 120 ug q 4 wks, last  9/14 > due 10/12 thurs - venofer 50 mg weekly - calcitriol 0.5 ug tiw  po tts   Home meds include - amlodipine 10, aspirin, atovastatin, clopidogrel, humalog insulin/ degludec, hydralazine 50 tid, hctz 25 qd, labetalol 200 bid, pantoprazole, sevelamer carb 800 tid, prns/ vits/ supps   Assessment/Plan: DKA/ DM1 - BS 1061 at admit. IV insulin now off. Recent serum glucose 186, continue SSI. Per Dr. Jonnie Finner: try to avoid saline IVF"s (LR, NS) w/ the DKA protocol. If IVF"s needed to avoid low BS, recommend just using D5W or D510. Managed by primary.  ESRD - on HD TTS. Next HD today. Will keep UFG low d/t recent orthostasis. Orthostatic hypotension/volume - Noted patient had near syncopal episode while working with PT yesterday d/t orthostatic hypotension. Amlodiopine was d/c'd and Hydralazine and Labetalol were lowered. Lowered Hydralazine further to 10mg  BID-may need to stop completely at discharge. Checking orthostatics in AM Anemia esrd - ESA recently started here. Hgb now 10.3 MBD ckd - CCa in range, PO4 elevated, cont po vdra tts and renvela.   Dispo: Remains inpatient. See above  Tobie Poet, NP [facility] 07/13/2022,3:22 PM  LOS: 2 days

## 2022-07-14 DIAGNOSIS — R739 Hyperglycemia, unspecified: Secondary | ICD-10-CM | POA: Diagnosis not present

## 2022-07-14 LAB — BASIC METABOLIC PANEL
Anion gap: 10 (ref 5–15)
BUN: 30 mg/dL — ABNORMAL HIGH (ref 6–20)
CO2: 31 mmol/L (ref 22–32)
Calcium: 8.9 mg/dL (ref 8.9–10.3)
Chloride: 96 mmol/L — ABNORMAL LOW (ref 98–111)
Creatinine, Ser: 5.95 mg/dL — ABNORMAL HIGH (ref 0.61–1.24)
GFR, Estimated: 10 mL/min — ABNORMAL LOW (ref >60)
Glucose, Bld: 202 mg/dL — ABNORMAL HIGH (ref 70–99)
Potassium: 3.9 mmol/L (ref 3.5–5.1)
Sodium: 137 mmol/L (ref 135–145)

## 2022-07-14 LAB — GLUCOSE, CAPILLARY
Glucose-Capillary: 150 mg/dL — ABNORMAL HIGH (ref 70–99)
Glucose-Capillary: 180 mg/dL — ABNORMAL HIGH (ref 70–99)
Glucose-Capillary: 219 mg/dL — ABNORMAL HIGH (ref 70–99)
Glucose-Capillary: 280 mg/dL — ABNORMAL HIGH (ref 70–99)
Glucose-Capillary: 333 mg/dL — ABNORMAL HIGH (ref 70–99)
Glucose-Capillary: 40 mg/dL — CL (ref 70–99)
Glucose-Capillary: 65 mg/dL — ABNORMAL LOW (ref 70–99)

## 2022-07-14 LAB — CBC
HCT: 31.3 % — ABNORMAL LOW (ref 39.0–52.0)
Hemoglobin: 10.4 g/dL — ABNORMAL LOW (ref 13.0–17.0)
MCH: 29.2 pg (ref 26.0–34.0)
MCHC: 33.2 g/dL (ref 30.0–36.0)
MCV: 87.9 fL (ref 80.0–100.0)
Platelets: 280 10*3/uL (ref 150–400)
RBC: 3.56 MIL/uL — ABNORMAL LOW (ref 4.22–5.81)
RDW: 15.9 % — ABNORMAL HIGH (ref 11.5–15.5)
WBC: 5.8 10*3/uL (ref 4.0–10.5)
nRBC: 0 % (ref 0.0–0.2)

## 2022-07-14 MED ORDER — INSULIN ASPART 100 UNIT/ML IJ SOLN
3.0000 [IU] | Freq: Three times a day (TID) | INTRAMUSCULAR | Status: DC
  Administered 2022-07-14 (×2): 3 [IU] via SUBCUTANEOUS

## 2022-07-14 MED ORDER — CHLORHEXIDINE GLUCONATE CLOTH 2 % EX PADS
6.0000 | MEDICATED_PAD | Freq: Every day | CUTANEOUS | Status: DC
  Administered 2022-07-15 – 2022-07-16 (×2): 6 via TOPICAL

## 2022-07-14 NOTE — Progress Notes (Signed)
Mobility Specialist Progress Note:   07/14/22 1334  Mobility  Activity Contraindicated/medical hold   Pt currently orthostatic. Informed by PTA to hold mobility for today. Will f/u as able.   Louis Peters Mobility Specialist-Acute Rehab Secure Chat only

## 2022-07-14 NOTE — Hospital Course (Addendum)
56 year old male with ESRD on HD TTS, diabetes mellitus type 1, HTN, HLP presented with nausea vomiting and epigastric abdominal pain.  Patient is on long-acting insulin and 5 units with meals, reported that his blood sugars have been especially high over the last few days.  Patient reported that he developed progressively worsening nausea and vomiting in the last 24 hours, unable to hold anything down . Patient was admitted treated for DKA, transitioned to subcu insulin blood sugar stabilized on the having intermittent hypoglycemia further adjusted meds.  He had near-syncopal syncopal episode with orthostatic hypotension on 07/26/22- so discharge was held fluids were given

## 2022-07-14 NOTE — Progress Notes (Addendum)
PROGRESS NOTE Louis Peters  JSE:[id number] DOB: 10-Dec-1965 DOA: 07/10/2022 PCP: Bernerd Limbo, MD   Brief Narrative/Hospital Course: 56 year old male with ESRD on HD TTS, diabetes mellitus type 1, HTN, HLP presented with nausea vomiting and epigastric abdominal pain.  Patient is on long-acting insulin and 5 units with meals, reported that his blood sugars have been especially high over the last few days.  Patient reported that he developed progressively worsening nausea and vomiting in the last 24 hours, unable to hold anything down .   Subjective: Seen this morning resting comfortably has not gotten up.  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  Orthostatic vitals this morning BP 112/60 (72), Sitting 94/59 (77), Standing (0 minutes) 65/46> patient symptomatic.  We will need to optimize antihypertensive further-stop hydralazine.  Patient on labetalol 100 twice daily, will make nephrology aware, continue to monitor.  Diabetic ketoacidosis in the setting of poorly controlled diabetes mellitus type 1 AG metabolic acidosis, pseudohyponatremia, POA Hyperkalemia, POA  DKA resolved.  Blood sugar borderline controlled.  Continue current SSI, Semglee 15 units daily, monitor blood glucose, continue dietary education, diet controlled.  Add Premeal 10 units NovoLog.Hba1c last 10.7 on 10/14/2021. Recent Labs  Lab 07/11/22 0613 07/11/22 0632 07/13/22 1436 07/13/22 2032 07/13/22 2309 07/14/22 0331 07/14/22 0935  GLUCAP  --    < > 276* 124* 160* 150* 280*  HGBA1C 11.2*  --   --   --   --   --   --     < > = values in this interval not displayed.    Nausea vomiting continue diet as tolerated.  Symptomatic management. ESRD on HD TTS, seen by nephrology underwent dialysis.  For HD Saturday. 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 calcium and phosphate elevated continue PO VRDA and Renvela per nephrology  DVT prophylaxis: heparin injection 5,000 Units Start: 07/11/22 1400 SCDs Start: 07/11/22 0425 Code Status:   Code Status: Full Code Family Communication: plan of care discussed with patient/wife on phone  Patient status is: Inpatient because of orthostatic hypotension symptomatic Level of care: Telemetry Medical   Dispo: The patient is from: home            Anticipated disposition: home  Mobility Assessment (last 72 hours)     Mobility Assessment     Row Name 07/14/22 0850 07/13/22 2015 07/13/22 0730 07/12/22 1940 07/12/22 1540   Does patient have an order for bedrest or is patient medically unstable No - Continue assessment No - Continue assessment No - Continue assessment No - Continue assessment No - Continue assessment   What is the highest level of mobility based on the progressive mobility assessment? Level 5 (Walks with assist in room/hall) - Balance while stepping forward/back and can walk in room with assist - Complete Level 4 (Walks with assist in room) - Balance while marching in place and cannot step forward and back - Complete Level 4 (Walks with assist in room) - Balance while marching in place and cannot step forward and back - Complete Level 4 (Walks with assist in room) - Balance while marching in place  and cannot step forward and back - Complete Level 4 (Walks with assist in room) - Balance while marching in place and cannot step forward and back - Complete    Row Name 07/12/22 1404 07/12/22 0800 07/12/22 0200       Does patient have an order for bedrest or is patient medically unstable -- No - Continue assessment No -  Continue assessment     What is the highest level of mobility based on the progressive mobility assessment? Level 5 (Walks with assist in room/hall) - Balance while stepping forward/back and can walk in room with assist - Complete Level 4 (Walks with assist in room) - Balance while marching in place and cannot step forward and back - Complete Level 4 (Walks with assist in room) - Balance while marching in place and cannot step forward and back - Complete               Objective: Vitals last 24 hrs: Vitals:   07/13/22 2030 07/14/22 0346 07/14/22 0400 07/14/22 0900  BP: 102/64 (!) 161/75 (!) 155/75 (!) 150/74  Pulse: 80  70 75  Resp: 20 12 11 13   Temp: 98.6 F (37 C)  98.6 F (37 C) 98.4 F (36.9 C)  TempSrc: Oral Oral Oral Oral  SpO2: 99%  99% 98%  Weight:       Weight change:   Physical Examination: General exam: alert awake, older than stated age HEENT:Oral mucosa moist, Ear/Nose WNL grossly Respiratory system: bilaterally clear BS, no use of accessory muscle Cardiovascular system: S1 & S2 +, No JVD. Gastrointestinal system: Abdomen soft,NT,ND, BS+ Nervous System:Alert, awake, moving extremities. Extremities: LE edema neg,distal peripheral pulses palpable.  Skin: No rashes,no icterus. MSK: Normal muscle bulk,tone, power  Medications reviewed:  Scheduled Meds:  aspirin EC  81 mg Oral q morning   atorvastatin  80 mg Oral Daily   calcitRIOL  0.5 mcg Oral Q T,Th,Sa-HD   Chlorhexidine Gluconate Cloth  6 each Topical Q0600   clopidogrel  75 mg Oral q morning   darbepoetin (ARANESP) injection - DIALYSIS  100 mcg Intravenous Q Thu-HD   heparin  5,000 Units Subcutaneous Q8H   hydrALAZINE  10 mg Oral BID   insulin aspart  0-6 Units Subcutaneous Q4H   insulin glargine-yfgn  15 Units Subcutaneous Daily   labetalol  100 mg Oral BID   sertraline  50 mg Oral Daily   sevelamer carbonate  800 mg Oral TID WC   Continuous Infusions:    Diet Order             Diet full liquid  Room service appropriate? Yes; Fluid consistency: Thin  Diet effective now           Diet Carb Modified                            Intake/Output Summary (Last 24 hours) at 07/14/2022 1127 Last data filed at 07/13/2022 2015 Gross per 24 hour  Intake --  Output 1200 ml  Net -1200 ml   Net IO Since Admission: -3,370.11 mL [07/14/22 1127]  Wt Readings from Last 3 Encounters:  07/13/22 72.4 kg  06/26/22 82.1 kg  02/21/22 82.2 kg     Unresulted Labs (From admission, onward)     Start     Ordered   07/10/22 2303  Urinalysis, Routine w reflex microscopic  Once,   URGENT        07/10/22 2302  Data Reviewed: I have personally reviewed following labs and imaging studies CBC: Recent Labs  Lab 07/10/22 2308 07/10/22 2320 07/11/22 0613 07/11/22 1144 07/12/22 0758 07/14/22 0543  WBC 11.1*  --  10.6* 10.6* 10.6* 5.8  NEUTROABS 9.7*  --   --   --   --   --   HGB 11.2* 12.9* 9.7* 9.8* 10.3* 10.4*  HCT 36.9* 38.0* 28.5* 29.2* 30.6* 31.3*  MCV 94.9  --  87.7 86.9 86.4 87.9  PLT 346  --  322 361 326 263   Basic Metabolic Panel: Recent Labs  Lab 07/10/22 2308 07/10/22 2320 07/11/22 1144 07/11/22 1535 07/12/22 0252 07/12/22 0758 07/14/22 0543  NA 127*   < > 137 139 139 136 137  K 6.2*   < > 4.0 3.6 3.6 3.7 3.9  CL 84*  --  92* 94* 94* 93* 96*  CO2 16*  --  30 29 28 30 31   GLUCOSE 1,061*  --  385* 142* 217* 186* 202*  BUN 78*  --  84* 81* 83* 40* 30*  CREATININE 8.75*   < > 8.61* 8.66* 8.76* 5.39* 5.95*  CALCIUM 8.6*  --  8.6* 8.5* 8.7* 8.8* 8.9  MG 2.3  --   --   --   --   --   --   PHOS  --   --  6.4*  --   --   --   --    < > = values in this interval not displayed.   GFR: Estimated Creatinine Clearance: 14 mL/min (A) (by C-G formula based on SCr of 5.95 mg/dL (H)). Liver Function Tests: Recent Labs  Lab 07/10/22 2308  AST 22  ALT 19  ALKPHOS 84  BILITOT 2.0*  PROT 6.3*  ALBUMIN 3.6   Recent Labs  Lab 07/10/22 2308  LIPASE 39   No  results for input(s): "AMMONIA" in the last 168 hours. Coagulation Profile: No results for input(s): "INR", "PROTIME" in the last 168 hours. BNP (last 3 results) No results for input(s): "PROBNP" in the last 8760 hours. HbA1C: No results for input(s): "HGBA1C" in the last 72 hours. CBG: Recent Labs  Lab 07/13/22 1436 07/13/22 2032 07/13/22 2309 07/14/22 0331 07/14/22 0935  GLUCAP 276* 124* 160* 150* 280*   Lipid Profile: No results for input(s): "CHOL", "HDL", "LDLCALC", "TRIG", "CHOLHDL", "LDLDIRECT" in the last 72 hours. Thyroid Function Tests: No results for input(s): "TSH", "T4TOTAL", "FREET4", "T3FREE", "THYROIDAB" in the last 72 hours. Sepsis Labs: No results for input(s): "PROCALCITON", "LATICACIDVEN" in the last 168 hours.  No results found for this or any previous visit (from the past 240 hour(s)).  Antimicrobials: Anti-infectives (From admission, onward)    None      Culture/Microbiology    Component Value Date/Time   SDES  01/02/2021 1210    BLOOD LEFT HAND Performed at [facility], [address] [place], [place] [address]    [facility]  01/02/2021 1210    BOTTLES DRAWN AEROBIC AND ANAEROBIC Blood Culture adequate volume Performed at [facility], [address] [place], [place] [address]    CULT  01/02/2021 1210    NO GROWTH 5 DAYS Performed at [facility], [place] [address], [place], [place] [address]    REPTSTATUS 01/07/2021 FINAL 01/02/2021 1210    Other culture-see note  Radiology Studies: No results found.   LOS: 3 days   Antonieta Pert, MD [facility]  07/14/2022, 11:27 AM

## 2022-07-14 NOTE — Care Management Important Message (Signed)
Important Message  Patient Details  Name: Louis Peters MRN: [id number] Date of Birth: Aug 30, 1966   Medicare Important Message Given:  Yes     Orbie Pyo 07/14/2022, 3:17 PM

## 2022-07-14 NOTE — Progress Notes (Signed)
Inpatient Diabetes Program Recommendations  AACE/ADA: New Consensus Statement on Inpatient Glycemic Control (2015)  Target Ranges:  Prepandial:   less than 140 mg/dL      Peak postprandial:   less than 180 mg/dL (1-2 hours)      Critically ill patients:  140 - 180 mg/dL   Lab Results  Component Value Date   GLUCAP 333 (H) 07/14/2022   HGBA1C 11.2 (H) 07/11/2022    Review of Glycemic Control  Latest Reference Range & Units 07/14/22 03:31 07/14/22 09:35 07/14/22 11:31  Glucose-Capillary 70 - 99 mg/dL 150 (H) 280 (H) 333 (H)   Diabetes history: DM 1 Outpatient Diabetes medications: Tresiba 8 units daily, Humalog 5 units TID with meals plus Humalog 1-2 units if needed for hyperglycemia Current orders for Inpatient glycemic control: Semglee 15 units daily, Novolog 0-6 units TID with meals, Novolog 0-5 units QHS   Inpatient Diabetes Program Recommendations:    Please consider restarting Novolog meal coverage 3 units tid with meals (hold if patient eats less than 50% or NPO).    Thanks,  Adah Perl, RN, BC-ADM Inpatient Diabetes Coordinator Pager [phone]  (8a-5p)

## 2022-07-14 NOTE — Progress Notes (Signed)
PT Cancellation Note  Patient Details Name: Louis Peters MRN: [id number] DOB: Jul 13, 1966   Cancelled Treatment:    Reason Eval/Treat Not Completed: (P) Medical issues which prohibited therapy (Attempt @ 11:21 however pt with recent episode orthostatic hypotension with nursing staff checking VS. MD ordered TED hose but not in room yet when PTA checked again ~1:30, will continue efforts per PT plan of care as schedule permits.)   Carlene Coria 07/14/2022, 5:18 PM

## 2022-07-14 NOTE — Progress Notes (Signed)
Subjective: No complaints seen in room, has not attempted ambulation yet, HD tomorrow on schedule a.m. BP okay  Objective Vital signs in last 24 hours: Vitals:   07/13/22 2030 07/14/22 0346 07/14/22 0400 07/14/22 0900  BP: 102/64 (!) 161/75 (!) 155/75 (!) 150/74  Pulse: 80  70 75  Resp: 20 12 11 13   Temp: 98.6 F (37 C)  98.6 F (37 C) 98.4 F (36.9 C)  TempSrc: Oral Oral Oral Oral  SpO2: 99%  99% 98%  Weight:       Weight change:   Physical Exam: General: Aler adult male NAD appropriate, pleasant  Heart: RRR no MRG Lungs: CTA nonlabored breathing Abdomen: NABS soft NTND Extremities: No pedal edema  Dialysis Access: RUA AVF positive bruit  OP dialysis Orders: TTS [facility] 4h  400/1.5  75kg  2/2 bath  Hep none RUA AVF - last HD 10/7, post wt 76.1 - last Hb 10.4 on 10/5 - mircera 120 ug q 4 wks, last  9/14 > due 10/12 thurs - venofer 50 mg weekly - calcitriol 0.5 ug tiw po tts   Home meds include - amlodipine 10, aspirin, atovastatin, clopidogrel, humalog insulin/ degludec, hydralazine 50 tid, hctz 25 qd, labetalol 200 bid, pantoprazole, sevelamer carb 800 tid, prns/ vits/ supps    Problem/Plan: ESRD HD TTS on schedule Orthostatic hypotension volume= noted near syncopal episode with PT 07/12/2022, BP meds taper back follow-up orthostatics DKA/ DM1 - BS 1061 at admit./ continue SSI.  try to avoid saline IVF"s (LR, NS) w/ the DKA protocol. If IVF"s needed to avoid low BS, recommend just using D5W or D510. Managed by primary.  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  Aranesp 100 q. Thursday HD 10/12 given MBD ckd - CCa in range, PO4 6.4, cont   Ernest Haber, PA-C [facility] Beeper [phone] 07/14/2022,10:12 AM  LOS: 3 days   Labs: Basic Metabolic Panel: Recent Labs  Lab 07/11/22 1144 07/11/22 1535 07/12/22 0252 07/12/22 0758 07/14/22 0543  NA 137   < > 139 136 137  K 4.0   < > 3.6 3.7 3.9  CL 92*   < > 94* 93* 96*  CO2 30   < > 28 30 31    GLUCOSE 385*   < > 217* 186* 202*  BUN 84*   < > 83* 40* 30*  CREATININE 8.61*   < > 8.76* 5.39* 5.95*  CALCIUM 8.6*   < > 8.7* 8.8* 8.9  PHOS 6.4*  --   --   --   --    < > = values in this interval not displayed.   Liver Function Tests: Recent Labs  Lab 07/10/22 2308  AST 22  ALT 19  ALKPHOS 84  BILITOT 2.0*  PROT 6.3*  ALBUMIN 3.6   Recent Labs  Lab 07/10/22 2308  LIPASE 39   No results for input(s): "AMMONIA" in the last 168 hours. CBC: Recent Labs  Lab 07/10/22 2308 07/10/22 2320 07/11/22 0613 07/11/22 1144 07/12/22 0758 07/14/22 0543  WBC 11.1*  --  10.6* 10.6* 10.6* 5.8  NEUTROABS 9.7*  --   --   --   --   --   HGB 11.2*   < > 9.7* 9.8* 10.3* 10.4*  HCT 36.9*   < > 28.5* 29.2* 30.6* 31.3*  MCV 94.9  --  87.7 86.9 86.4 87.9  PLT 346  --  322 361 326 280   < > = values in this interval not displayed.  Cardiac Enzymes: No results for input(s): "CKTOTAL", "CKMB", "CKMBINDEX", "TROPONINI" in the last 168 hours. CBG: Recent Labs  Lab 07/13/22 1436 07/13/22 2032 07/13/22 2309 07/14/22 0331 07/14/22 0935  GLUCAP 276* 124* 160* 150* 280*    Studies/Results: No results found. Medications:   aspirin EC  81 mg Oral q morning   atorvastatin  80 mg Oral Daily   calcitRIOL  0.5 mcg Oral Q T,Th,Sa-HD   Chlorhexidine Gluconate Cloth  6 each Topical Q0600   clopidogrel  75 mg Oral q morning   darbepoetin (ARANESP) injection - DIALYSIS  100 mcg Intravenous Q Thu-HD   heparin  5,000 Units Subcutaneous Q8H   hydrALAZINE  10 mg Oral BID   insulin aspart  0-6 Units Subcutaneous Q4H   insulin glargine-yfgn  15 Units Subcutaneous Daily   labetalol  100 mg Oral BID   sertraline  50 mg Oral Daily   sevelamer carbonate  800 mg Oral TID WC

## 2022-07-15 DIAGNOSIS — R739 Hyperglycemia, unspecified: Secondary | ICD-10-CM | POA: Diagnosis not present

## 2022-07-15 LAB — GLUCOSE, CAPILLARY
Glucose-Capillary: 161 mg/dL — ABNORMAL HIGH (ref 70–99)
Glucose-Capillary: 205 mg/dL — ABNORMAL HIGH (ref 70–99)
Glucose-Capillary: 270 mg/dL — ABNORMAL HIGH (ref 70–99)
Glucose-Capillary: 227 mg/dL — ABNORMAL HIGH (ref 70–99)
Glucose-Capillary: 259 mg/dL — ABNORMAL HIGH (ref 70–99)
Glucose-Capillary: 252 mg/dL — ABNORMAL HIGH (ref 70–99)

## 2022-07-15 MED ORDER — INSULIN GLARGINE-YFGN 100 UNIT/ML ~~LOC~~ SOLN
10.0000 [IU] | Freq: Every day | SUBCUTANEOUS | Status: DC
  Administered 2022-07-15 – 2022-07-17 (×3): 10 [IU] via SUBCUTANEOUS
  Filled 2022-07-15 (×4): qty 0.1

## 2022-07-15 MED ORDER — LABETALOL HCL 100 MG PO TABS
50.0000 mg | ORAL_TABLET | Freq: Two times a day (BID) | ORAL | Status: DC
  Administered 2022-07-15: 50 mg via ORAL
  Filled 2022-07-15 (×2): qty 0.5

## 2022-07-15 NOTE — Progress Notes (Signed)
PROGRESS NOTE Louis Peters  QPY:[id number] DOB: 1966/07/22 DOA: 07/10/2022 PCP: Bernerd Limbo, MD   Brief Narrative/Hospital Course: 56 year old male with ESRD on HD TTS, diabetes mellitus type 1, HTN, HLP presented with nausea vomiting and epigastric abdominal pain.  Patient is on long-acting insulin and 5 units with meals, reported that his blood sugars have been especially high over the last few days.  Patient reported that he developed progressively worsening nausea and vomiting in the last 24 hours, unable to hold anything down . Patient was admitted treated for DKA, transitioned to subcu insulin blood sugar stabilized on the having intermittent hypoglycemia further adjusted meds.  He had near-syncopal syncopal episode with orthostatic hypotension> BP meds being tapered down   Subjective: Seen and examined this morning.  Resting comfortably Hypoglycemia in 40s to 60s 11 PM last night Cutting down Semeglee from 15 to 10 units. 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  Orthostatic vitals 1/13- BP 112/60 (72), Sitting 94/59 (77), Standing (0 minutes) 65/46> patient was symptomatic> stopped his hydralazin> on labetalol 100 mg twice> was decreased but given his still symptomatic orthostatic.Marland Kitchen  Discussed with nephrology to adjust fluids/meds furthe- for HD today. Continue PT OT. Ordered compression stocking.    Diabetic ketoacidosis in the setting of poorly controlled diabetes mellitus type 1 with TIW5Y 09.9 AG metabolic acidosis, pseudohyponatremia, POA DKA had resolved was transitioned to subcu insulin but  hypoglycemia 10/14/21 11 pm> so decrease Semglee 15>10 u, cont ssi, hold off premeal insulin. Cont with dietary education.  Continue to monitor CBG to adjust insulin further Recent Labs  Lab 07/11/22 0613 07/11/22 0632 07/14/22 2350 07/15/22 0123 07/15/22 0314 07/15/22 0816 07/15/22 1148  GLUCAP  --    < > 65* 161* 205* 270* 227*  HGBA1C 11.2*  --   --   --   --   --   --    < > = values in this interval not displayed.   Hyperkalemia, POA-resolved  Nausea vomiting: Advance to soft diet.   ESRD on HD TTS, seen by nephrology underwent dialysis.  For HD 10/15/21 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 calcium and phosphate elevated continue PO VRDA and Renvela per nephrology  DVT prophylaxis: heparin injection 5,000 Units Start: 07/11/22 1400 SCDs Start: 07/11/22 0425 Code Status:   Code Status: Full Code Family Communication: plan of care discussed with patient/wife on phone  Patient status is: Inpatient because of orthostatic hypotension symptomatic Level of care: Telemetry Medical   Dispo: The patient is from: home.  Current activity level 3> stand with assist balance while standing and cannot march in place  Anticipated disposition: home  Objective: Vitals last 24 hrs: Vitals:   07/14/22 2000 07/14/22 2352 07/15/22 0400 07/15/22 0850  BP: 134/71 (!) 141/69 130/73 (!) 142/73  Pulse: 73 75 68 71  Resp: 14 14 16 16   Temp: 98.5 F (36.9 C) 98.2 F (36.8 C) 98.3 F (36.8 C) 99.3 F (37.4 C)  TempSrc: Oral Oral Oral   SpO2: 100% 100% 98% 100%  Weight:       Weight change:  Physical Examination: General exam: alert awake, vented, pleasant HEENT:Oral mucosa moist, Ear/Nose WNL grossly Respiratory system: Bilaterally clear BS, no use of accessory muscle Cardiovascular system: S1 & S2 +, No JVD. Gastrointestinal system: Abdomen soft,NT,ND, BS+ Nervous System: Alert, awake, moving extremities, moving  lower extremities well nonfocal  Extremities: LE edema neg,distal peripheral pulses palpable.  Skin: No rashes,no icterus. MSK: Normal muscle bulk,tone, power   Medications reviewed:  Scheduled Meds:  aspirin EC  81 mg Oral q morning   atorvastatin  80 mg Oral Daily   calcitRIOL  0.5 mcg Oral Q T,Th,Sa-HD   Chlorhexidine Gluconate Cloth  6 each Topical Q0600   Chlorhexidine Gluconate Cloth  6 each Topical Q0600   clopidogrel  75 mg Oral q morning   darbepoetin (ARANESP) injection - DIALYSIS  100 mcg Intravenous Q Thu-HD   heparin  5,000 Units Subcutaneous Q8H   insulin aspart  0-6 Units Subcutaneous Q4H   insulin glargine-yfgn  10 Units Subcutaneous Daily   labetalol  50 mg Oral BID   sertraline  50 mg Oral Daily   sevelamer carbonate  800 mg Oral TID WC   Continuous Infusions:    Diet Order             Diet Carb Modified Fluid consistency: Thin; Room service appropriate? Yes; Fluid restriction: 1500 mL Fluid  Diet effective now           Diet Carb Modified                   Intake/Output Summary (Last 24 hours) at 07/15/2022 1219 Last data filed at 07/15/2022 0858 Gross per 24 hour  Intake 360 ml  Output 425 ml  Net -65 ml   Net IO Since Admission: -3,075.11 mL [07/15/22 1219]  Wt Readings from Last 3 Encounters:  07/13/22 72.4 kg  06/26/22 82.1 kg  02/21/22 82.2 kg     Unresulted Labs (From admission, onward)     Start     Ordered   07/10/22 2303  Urinalysis, Routine w reflex microscopic  Once,   URGENT        07/10/22 2302   Signed and Held  Renal function panel  Once,   R       Question:  Specimen collection method  Answer:  Lab=Lab collect   Signed and Held   Signed and Held  CBC  Once,   R       Question:  Specimen collection method  Answer:  Lab=Lab collect   Signed and Held          Data Reviewed: I have personally reviewed following labs and imaging studies CBC: Recent Labs  Lab 07/10/22 2308 07/10/22 2320 07/11/22 0613 07/11/22 1144  07/12/22 0758 07/14/22 0543  WBC 11.1*  --  10.6* 10.6* 10.6* 5.8  NEUTROABS 9.7*  --   --   --   --   --   HGB 11.2* 12.9* 9.7* 9.8* 10.3* 10.4*  HCT 36.9* 38.0* 28.5* 29.2* 30.6* 31.3*  MCV 94.9  --  87.7 86.9 86.4 87.9  PLT 346  --  322 361 326 616   Basic Metabolic Panel: Recent Labs  Lab 07/10/22 2308 07/10/22 2320 07/11/22 1144 07/11/22 1535 07/12/22 0252 07/12/22 0758 07/14/22 0543  NA 127*   < > 137 139 139 136 137  K 6.2*   < > 4.0 3.6 3.6 3.7 3.9  CL 84*  --  92* 94* 94* 93* 96*  CO2 16*  --  30 29 28 30 31   GLUCOSE 1,061*  --  385* 142* 217* 186* 202*  BUN 78*  --  84* 81* 83* 40* 30*  CREATININE 8.75*   < > 8.61* 8.66* 8.76* 5.39* 5.95*  CALCIUM 8.6*  --  8.6* 8.5* 8.7* 8.8* 8.9  MG 2.3  --   --   --   --   --   --   PHOS  --   --  6.4*  --   --   --   --    < > = values in this interval not displayed.   GFR: Estimated Creatinine Clearance: 14 mL/min (A) (by C-G formula based on SCr of 5.95 mg/dL (H)). Liver Function Tests: Recent Labs  Lab 07/10/22 2308  AST 22  ALT 19  ALKPHOS 84  BILITOT 2.0*  PROT 6.3*  ALBUMIN 3.6   Recent Labs  Lab 07/10/22 2308  LIPASE 39   No results found for this or any previous visit (from the past 240 hour(s)).  Antimicrobials: Anti-infectives (From admission, onward)    None      Culture/Microbiology    Component Value Date/Time   SDES  01/02/2021 1210    BLOOD LEFT HAND Performed at [facility], [address] [place], [place] [address]    [facility]  01/02/2021 1210    BOTTLES DRAWN AEROBIC AND ANAEROBIC Blood Culture adequate volume Performed at [facility], [address], [place], [place] [address]    CULT  01/02/2021 1210    NO GROWTH 5 DAYS Performed at [facility], [place] [address], [place], [place] [address]    REPTSTATUS 01/07/2021 FINAL 01/02/2021 1210    Other culture-see note  Radiology Studies: No results found.   LOS: 4 days   Antonieta Pert, MD [facility]  07/15/2022, 12:19 PM

## 2022-07-15 NOTE — Progress Notes (Addendum)
Subjective: Seen in room no complaints , for dialysis on schedule today, noted orthostatic yesterday and we are adjusting meds with IM.  Objective Vital signs in last 24 hours: Vitals:   07/14/22 2000 07/14/22 2352 07/15/22 0400 07/15/22 0850  BP: 134/71 (!) 141/69 130/73 (!) 142/73  Pulse: 73 75 68 71  Resp: 14 14 16 16   Temp: 98.5 F (36.9 C) 98.2 F (36.8 C) 98.3 F (36.8 C) 99.3 F (37.4 C)  TempSrc: Oral Oral Oral   SpO2: 100% 100% 98% 100%  Weight:       Weight change:   Physical Exam: General: Alert  adult male NAD appropriate, pleasant  Heart: RRR no MRG Lungs: CTA nonlabored breathing Abdomen: NABS soft NTND Extremities: No pedal edema  Dialysis Access: RUA AVF positive bruit   OP dialysis Orders: TTS [facility] 4h  400/1.5  75kg  2/2 bath  Hep none RUA AVF - last HD 10/7, post wt 76.1 - last Hb 10.4 on 10/5 - mircera 120 ug q 4 wks, last  9/14 > due 10/12 thurs - venofer 50 mg weekly - calcitriol 0.5 ug tiw po tts   Home meds include - amlodipine 10, aspirin, atovastatin, clopidogrel, humalog insulin/ degludec, hydralazine 50 tid, hctz 25 qd, labetalol 200 bid, pantoprazole, sevelamer carb 800 tid, prns/ vits/ supps      Problem/Plan: ESRD HD TTS on schedule Orthostatic hypotension volume= noted near syncopal episode with PT 07/12/2022, BP meds taper back amlodipine DC and also hydralazine.  With labetalol dose decreased,Now on labetalol 100 mg BID follow-up BP with dialysis scenario. Just had orthostatics checked again in as discussed with IM/admit team hypotensive standing will DC labetalol DKA/ DM1 - BS 1061 at admit./ continue SSI.  try to avoid saline IVF"s (LR, NS) w/ the DKA protocol. If IVF"s needed to avoid low BS, recommend just using D5W or D510. Managed by primary.  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  Aranesp 100 q. 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   Liver Function Tests: Recent Labs  Lab 07/10/22 2308  AST 22  ALT 19  ALKPHOS 84  BILITOT 2.0*  PROT 6.3*  ALBUMIN 3.6   Recent Labs  Lab 07/10/22 2308  LIPASE 39   No results for input(s): "AMMONIA" in the last 168 hours. CBC: Recent Labs  Lab 07/10/22 2308 07/10/22 2320 07/11/22 0613 07/11/22 1144 07/12/22 0758 07/14/22 0543  WBC 11.1*  --  10.6* 10.6* 10.6* 5.8  NEUTROABS 9.7*  --   --   --   --   --   HGB 11.2*   < > 9.7* 9.8*  10.3* 10.4*  HCT 36.9*   < > 28.5* 29.2* 30.6* 31.3*  MCV 94.9  --  87.7 86.9 86.4 87.9  PLT 346  --  322 361 326 280   < > = values in this interval not displayed.   Cardiac Enzymes: No results for input(s): "CKTOTAL", "CKMB", "CKMBINDEX", "TROPONINI" in the last 168 hours. CBG: Recent Labs  Lab 07/14/22 2350 07/15/22 0123 07/15/22 0314 07/15/22 0816 07/15/22 1148  GLUCAP 65* 161* 205* 270* 227*    Studies/Results: No results found. Medications:   aspirin EC  81 mg Oral q morning   atorvastatin  80 mg Oral Daily   calcitRIOL  0.5 mcg Oral Q T,Th,Sa-HD   Chlorhexidine Gluconate Cloth  6 each Topical Q0600   Chlorhexidine Gluconate Cloth  6 each Topical Q0600   clopidogrel  75 mg Oral q morning   darbepoetin (ARANESP) injection - DIALYSIS  100 mcg Intravenous Q Thu-HD   heparin  5,000 Units Subcutaneous Q8H   insulin aspart  0-6 Units Subcutaneous Q4H   insulin glargine-yfgn  10 Units Subcutaneous Daily   labetalol  50 mg Oral BID    sertraline  50 mg Oral Daily   sevelamer carbonate  800 mg Oral TID WC

## 2022-07-15 NOTE — Progress Notes (Signed)
Physical Therapy Treatment Patient Details Name: Louis Peters MRN: [id number] DOB: 1966/05/17 Today's Date: 07/15/2022   History of Present Illness Pt is a 56 y/o male admitted secondary to hyperglycemia. PMH includes ESRD on HD TTS and T1DM.    PT Comments    Patient unable to progress to ambulation again today despite knee hi TEDS due to continued BP drop.  He was able to only sit briefly initially, then after elevated bed to chair position, sat up again and though somewhat symptomatic stood to step up to [facility].  Then felt nauseated and pain on R side so deferred further and RN aware pt requesting meds.  He had not yet eaten today so assisted to order meal.  Patient unable to progress home today, but feel once BP stable still likely to mobilize well without follow up PT needs. Will continue to follow.    Orthostatic VS for the past 24 hrs (Last 3 readings):  BP- Lying Pulse- Lying BP- Sitting Pulse- Sitting BP- Standing at 0 minutes  07/15/22 1126 -- -- 102/65 -- 97/62  07/15/22 1120 142/76 77 125/75 77 --  07/15/22 1100 134/75 -- (!) 87/56 -- --     Recommendations for follow up therapy are one component of a multi-disciplinary discharge planning process, led by the attending physician.  Recommendations may be updated based on patient status, additional functional criteria and insurance authorization.  Follow Up Recommendations  No PT follow up (pending progress)     Assistance Recommended at Discharge    Patient can return home with the following A little help with walking and/or transfers;A little help with bathing/dressing/bathroom;Help with stairs or ramp for entrance;Assist for transportation;Assistance with cooking/housework   Equipment Recommendations  None recommended by PT    Recommendations for Other Services       Precautions / Restrictions Precautions Precautions: Fall Precaution Comments: watch BP     Mobility  Bed Mobility Overal bed mobility: Modified  Independent                  Transfers Overall transfer level: Needs assistance   Transfers: Sit to/from Stand Sit to Stand: Supervision           General transfer comment: assist for lines/safety    Ambulation/Gait Ambulation/Gait assistance: Min guard Gait Distance (Feet): 2 Feet Assistive device: None Gait Pattern/deviations: Step-to pattern       General Gait Details: side steps to [facility]; assist for safety and lines   Stairs             Wheelchair Mobility    Modified [name] (Stroke Patients Only)       Balance Overall balance assessment: Needs assistance   Sitting balance-Leahy Scale: Good       Standing balance-Leahy Scale: Fair Standing balance comment: briefly stood but limited due to symptomatic orthostatic hypotension                            Cognition Arousal/Alertness: Awake/alert Behavior During Therapy: WFL for tasks assessed/performed Overall Cognitive Status: Within Functional Limits for tasks assessed                                          Exercises      General Comments General comments (skin integrity, edema, etc.): BP initially dropped 40 mmHg from supine to sit, successive trials  still dropping, but not as much      Pertinent Vitals/Pain Pain Assessment Pain Assessment: No/denies pain    Home Living                          Prior Function            PT Goals (current goals can now be found in the care plan section) Progress towards PT goals: Not progressing toward goals - comment    Frequency    Min 3X/week      PT Plan Current plan remains appropriate    Co-evaluation              AM-PAC PT "6 Clicks" Mobility   Outcome Measure  Help needed turning from your back to your side while in a flat bed without using bedrails?: None Help needed moving from lying on your back to sitting on the side of a flat bed without using bedrails?: None Help needed  moving to and from a bed to a chair (including a wheelchair)?: A Little Help needed standing up from a chair using your arms (e.g., wheelchair or bedside chair)?: A Little Help needed to walk in hospital room?: Total Help needed climbing 3-5 steps with a railing? : Total 6 Click Score: 16    End of Session   Activity Tolerance: Treatment limited secondary to medical complications (Comment) Patient left: in bed;with call bell/phone within reach Nurse Communication: Mobility status PT Visit Diagnosis: Unsteadiness on feet (R26.81);Muscle weakness (generalized) (M62.81)     Time: 1106-1130 PT Time Calculation (min) (ACUTE ONLY): 24 min  Charges:  $Therapeutic Activity: 23-37 mins                     Magda Kiel, PT Acute Rehabilitation Services [phone] 07/15/2022    Reginia Naas 07/15/2022, 2:22 PM

## 2022-07-16 ENCOUNTER — Inpatient Hospital Stay (HOSPITAL_COMMUNITY): Payer: Medicare Other

## 2022-07-16 DIAGNOSIS — R739 Hyperglycemia, unspecified: Secondary | ICD-10-CM | POA: Diagnosis not present

## 2022-07-16 LAB — CBC
HCT: 32.6 % — ABNORMAL LOW (ref 39.0–52.0)
Hemoglobin: 11 g/dL — ABNORMAL LOW (ref 13.0–17.0)
MCH: 28.7 pg (ref 26.0–34.0)
MCHC: 33.7 g/dL (ref 30.0–36.0)
MCV: 85.1 fL (ref 80.0–100.0)
Platelets: 248 10*3/uL (ref 150–400)
RBC: 3.83 MIL/uL — ABNORMAL LOW (ref 4.22–5.81)
RDW: 15.6 % — ABNORMAL HIGH (ref 11.5–15.5)
WBC: 5.5 10*3/uL (ref 4.0–10.5)
nRBC: 0 % (ref 0.0–0.2)

## 2022-07-16 LAB — BASIC METABOLIC PANEL
Anion gap: 15 (ref 5–15)
BUN: 58 mg/dL — ABNORMAL HIGH (ref 6–20)
CO2: 26 mmol/L (ref 22–32)
Calcium: 9.2 mg/dL (ref 8.9–10.3)
Chloride: 92 mmol/L — ABNORMAL LOW (ref 98–111)
Creatinine, Ser: 8.52 mg/dL — ABNORMAL HIGH (ref 0.61–1.24)
GFR, Estimated: 7 mL/min — ABNORMAL LOW (ref >60)
Glucose, Bld: 353 mg/dL — ABNORMAL HIGH (ref 70–99)
Potassium: 4.6 mmol/L (ref 3.5–5.1)
Sodium: 133 mmol/L — ABNORMAL LOW (ref 135–145)

## 2022-07-16 LAB — GLUCOSE, CAPILLARY
Glucose-Capillary: 120 mg/dL — ABNORMAL HIGH (ref 70–99)
Glucose-Capillary: 136 mg/dL — ABNORMAL HIGH (ref 70–99)
Glucose-Capillary: 147 mg/dL — ABNORMAL HIGH (ref 70–99)
Glucose-Capillary: 356 mg/dL — ABNORMAL HIGH (ref 70–99)
Glucose-Capillary: 403 mg/dL — ABNORMAL HIGH (ref 70–99)
Glucose-Capillary: 68 mg/dL — ABNORMAL LOW (ref 70–99)
Glucose-Capillary: 97 mg/dL (ref 70–99)

## 2022-07-16 LAB — TYPE AND SCREEN
ABO/RH(D): O POS
Antibody Screen: NEGATIVE

## 2022-07-16 MED ORDER — POLYETHYLENE GLYCOL 3350 17 G PO PACK
17.0000 g | PACK | Freq: Two times a day (BID) | ORAL | Status: DC
  Administered 2022-07-17 – 2022-07-30 (×15): 17 g via ORAL
  Filled 2022-07-16 (×20): qty 1

## 2022-07-16 MED ORDER — DOCUSATE SODIUM 100 MG PO CAPS
200.0000 mg | ORAL_CAPSULE | Freq: Two times a day (BID) | ORAL | Status: DC
  Administered 2022-07-17 – 2022-07-30 (×22): 200 mg via ORAL
  Filled 2022-07-16 (×25): qty 2

## 2022-07-16 MED ORDER — BISACODYL 10 MG RE SUPP
10.0000 mg | Freq: Every day | RECTAL | Status: DC
  Administered 2022-07-16 – 2022-07-24 (×7): 10 mg via RECTAL
  Filled 2022-07-16 (×10): qty 1

## 2022-07-16 MED ORDER — PANTOPRAZOLE SODIUM 40 MG IV SOLR
40.0000 mg | Freq: Two times a day (BID) | INTRAVENOUS | Status: DC
  Administered 2022-07-16 – 2022-07-23 (×15): 40 mg via INTRAVENOUS
  Filled 2022-07-16 (×15): qty 10

## 2022-07-16 NOTE — Progress Notes (Signed)
   07/16/22 1808  Vitals  Temp 98.5 F (36.9 C)  Temp Source Oral  BP 103/85  MAP (mmHg) 92  BP Location Left Arm  BP Method Automatic  Patient Position (if appropriate) Lying  Pulse Rate 77  Pulse Rate Source Monitor  ECG Heart Rate 72  Resp 16  Oxygen Therapy  SpO2 100 %  O2 Device Room Air  Pulse Oximetry Type Continuous   Received patient in bed to unit.  Alert and oriented.  Informed consent signed and in chart.   Treatment initiated: 1420 Treatment completed: 1730  Patient tolerated well.  Transported back to the room  Alert, without acute distress.  Hand-off given to patient's nurse.   Access used: AVF Access issues: NA  Total UF removed: +900 ml Medication(s) given: NA Post HD VS: see above Post HD weight: UTA d/t pt being too unstable w/ his bp throughout tx. PA at bedside and agreed   [facility]

## 2022-07-16 NOTE — Progress Notes (Signed)
Subjective: No complaints seen in room lying in bed, not sure why  HD missed yesterday, orders written, orders written again for dialysis today.,   BP Meds have been decreased with his orthostasis  Objective Vital signs in last 24 hours: Vitals:   07/16/22 0049 07/16/22 0400 07/16/22 0800 07/16/22 1206  BP: (!) 174/86 (!) 143/82 (!) 186/88 (!) 163/90  Pulse: 71 70 78 77  Resp: 16 14 16 20   Temp: 97.8 F (36.6 C) 97.8 F (36.6 C) 99.1 F (37.3 C) 98.6 F (37 C)  TempSrc: Oral Oral Oral Oral  SpO2: 98% 100%    Weight:       Weight change:   Physical Exam: General: Alert  adult male NAD appropriate, pleasant  Heart: RRR no MRG Lungs: CTA nonlabored breathing Abdomen: NABS soft NTND Extremities: No pedal edema  Dialysis Access: RUA AVF positive bruit   OP dialysis Orders: TTS [facility] 4h  400/1.5  75kg  2/2 bath  Hep none RUA AVF - last HD 10/7, post wt 76.1 - last Hb 10.4 on 10/5 - mircera 120 ug q 4 wks, last  9/14 > due 10/12 thurs - venofer 50 mg weekly - calcitriol 0.5 ug tiw po tts   Home meds include - amlodipine 10, aspirin, atovastatin, clopidogrel, humalog insulin/ degludec, hydralazine 50 tid, hctz 25 qd, labetalol 200 bid, pantoprazole, sevelamer carb 800 tid, prns/ vits/ supps      Problem/Plan: ESRD= HD TTS , HD not done yesterday orders were written orders written again today. Orthostatic hypotension volume= noted near syncopal episode with PT 07/12/2022, BP meds taper back amlodipine DC and also hydralazine.  With labetalol dose decreased, yesterday on labetalol 100 mg BID f continued symptomatic orthostasis and labetalol DC'd / follow-up BP with dialysis scenario.  Need to check standing BP pre and postdialysis at OP center also DKA/ DM1 - BS 1061 at admit./ continue SSI.  try to avoid saline IVF"s (LR, NS) w/ the DKA protocol. If IVF"s needed to avoid low BS, recommend just using D5W or D510. Managed by primary.  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   Aranesp 100 q. 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  This occurred after I saw him earlier in the morning, then  he had no complaints at all .  Ernest Haber, PA-C [facility] [phone] 07/16/2022,12:31 PM  LOS: 5 days   Labs: Basic Metabolic Panel: Recent Labs  Lab 07/11/22 1144 07/11/22 1535 07/12/22 0758 07/14/22 0543 07/16/22 0928  NA 137   < > 136 137 133*  K 4.0   < > 3.7 3.9 4.6  CL 92*   < > 93* 96* 92*  CO2 30   < > 30 31 26   GLUCOSE 385*   < > 186* 202* 353*  BUN 84*   < > 40* 30* 58*  CREATININE 8.61*   < > 5.39* 5.95* 8.52*  CALCIUM 8.6*   < > 8.8* 8.9 9.2  PHOS 6.4*  --   --   --   --    < > = values in this interval not displayed.   Liver Function Tests: Recent Labs  Lab 07/10/22 2308  AST 22  ALT  19  ALKPHOS 84  BILITOT 2.0*  PROT 6.3*  ALBUMIN 3.6   Recent Labs  Lab 07/10/22 2308  LIPASE 39   No results for input(s): "AMMONIA" in the last 168 hours. CBC: Recent Labs  Lab 07/10/22 2308 07/10/22 2320 07/11/22 0613 07/11/22 1144 07/12/22 0758 07/14/22 0543 07/16/22 0928  WBC 11.1*  --  10.6* 10.6* 10.6* 5.8 5.5  NEUTROABS 9.7*  --   --   --   --   --   --   HGB 11.2*   < > 9.7* 9.8* 10.3* 10.4* 11.0*  HCT 36.9*   < > 28.5* 29.2* 30.6* 31.3* 32.6*  MCV 94.9  --  87.7 86.9 86.4 87.9 85.1  PLT 346  --  322 361 326 280 248   < > = values in this interval not displayed.   Cardiac Enzymes: No results for input(s): "CKTOTAL", "CKMB", "CKMBINDEX", "TROPONINI" in the last 168 hours. CBG: Recent Labs  Lab 07/15/22 2022 07/16/22 0020 07/16/22 0411 07/16/22 0933 07/16/22 1209  GLUCAP 252* 136* 147* 356* 403*    Studies/Results: DG Abd Portable 1V  Result Date:  07/16/2022 CLINICAL DATA:  Nausea. EXAM: PORTABLE ABDOMEN - 1 VIEW COMPARISON:  None Available. FINDINGS: The bowel gas pattern is normal. A small to moderate amount of stool within the colon and rectum noted. No radio-opaque calculi or other significant radiographic abnormality are seen. IMPRESSION: 1. No acute abnormalities. 2. Small to moderate amount of colonic/rectal stool. Electronically Signed   By: Margarette Canada M.D.   On: 07/16/2022 12:14   Medications:   aspirin EC  81 mg Oral q morning   atorvastatin  80 mg Oral Daily   bisacodyl  10 mg Rectal Daily   calcitRIOL  0.5 mcg Oral Q T,Th,Sa-HD   Chlorhexidine Gluconate Cloth  6 each Topical Q0600   Chlorhexidine Gluconate Cloth  6 each Topical Q0600   clopidogrel  75 mg Oral q morning   darbepoetin (ARANESP) injection - DIALYSIS  100 mcg Intravenous Q Thu-HD   docusate sodium  200 mg Oral BID   heparin  5,000 Units Subcutaneous Q8H   insulin aspart  0-6 Units Subcutaneous Q4H   insulin glargine-yfgn  10 Units Subcutaneous Daily   pantoprazole (PROTONIX) IV  40 mg Intravenous Q12H   polyethylene glycol  17 g Oral BID   sertraline  50 mg Oral Daily   sevelamer carbonate  800 mg Oral TID WC

## 2022-07-16 NOTE — Progress Notes (Addendum)
PROGRESS NOTE ANGLE Louis  Peters:[id number] DOB: [birth year] DOA: 07/10/2022 PCP: Bernerd Limbo, MD   Brief Narrative/Hospital Course: 56 year old male with ESRD on HD TTS, diabetes mellitus type 1, HTN, HLP presented with nausea vomiting and epigastric abdominal pain.  Patient is on long-acting insulin and 5 units with meals, reported that his blood sugars have been especially high over the last few days.  Patient reported that he developed progressively worsening nausea and vomiting in the last 24 hours, unable to hold anything down . Patient was admitted treated for DKA, transitioned to subcu insulin blood sugar stabilized on the having intermittent hypoglycemia further adjusted meds.  He had near-syncopal syncopal episode with orthostatic hypotension> BP meds being tapered down   Subjective: Patient in bed, appears comfortable, denies any headache, no fever, no chest pain or pressure, no shortness of breath , no abdominal pain. No new focal weakness.   Assessment and Plan:   Orthostatic hypotension near syncope episode while working with PT 10/12, amlodipine discontinued hydralazine labetalol dose decreased.  Stockings added, orthostatics much improved.  Continue to monitor. Nausea vomiting: Improved with supportive care. ESRD on HD TTS, seen by nephrology underwent dialysis, reminded nephrology of due dialysis on 07/16/2022, they will take him as soon as possible. 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  His nausea vomiting had improved however around 11 AM he had a large bout of emesis, also claims that no BM in the last 7 days, obtain KUB, supportive care for nausea, bowel regimen initiated will monitor if nausea  vomiting continues will involve GI.   Lab Results  Component Value Date   HGBA1C 11.2 (H) 07/11/2022   CBG (last 3)  Recent Labs    07/15/22 2022 07/16/22 0020 07/16/22 0411  GLUCAP 252* 136* 147*     DVT prophylaxis: Place TED hose Start: 07/16/22 0607 heparin injection 5,000 Units Start: 07/11/22 1400 SCDs Start: 07/11/22 0425 Code Status:   Code Status: Full Code Family Communication: plan of care discussed with patient/wife on phone  Patient status is: Inpatient because of orthostatic hypotension symptomatic Level of care: Telemetry Medical   Dispo: The patient is from: home.  Current activity level 3> stand with assist balance while standing and cannot march in place  Anticipated disposition: home  Objective: Vitals last 24 hrs: Vitals:   07/15/22 1251 07/15/22 1951 07/16/22 0049 07/16/22 0400  BP: (!) 145/72 (!) 144/71 (!) 174/86 (!) 143/82  Pulse: 73 74 71 70  Resp: 16 16 16 14   Temp: 99 F (37.2 C) 99 F (37.2 C) 97.8 F (36.6 C) 97.8 F (36.6 C)  TempSrc: Oral Oral Oral Oral  SpO2: 100% 100% 98% 100%  Weight:       Weight change:   Physical Examination:  Awake Alert, No new F.N deficits, Normal affect [place].AT,PERRAL Supple Neck, No JVD,   Symmetrical Chest wall movement, Good air movement bilaterally, CTAB RRR,No Gallops, Rubs or new Murmurs,  +ve B.Sounds, Abd Soft, No tenderness,   No Cyanosis, Clubbing or edema    Medications reviewed:  Scheduled Meds:  aspirin EC  81 mg Oral q morning   atorvastatin  80 mg Oral Daily   calcitRIOL  0.5 mcg Oral Q T,Th,Sa-HD   Chlorhexidine Gluconate Cloth  6 each  Topical Q0600   Chlorhexidine Gluconate Cloth  6 each Topical Q0600   clopidogrel  75 mg Oral q morning   darbepoetin (ARANESP) injection - DIALYSIS  100 mcg Intravenous Q Thu-HD   heparin  5,000 Units Subcutaneous Q8H   insulin aspart  0-6 Units Subcutaneous Q4H   insulin glargine-yfgn  10 Units Subcutaneous Daily   sertraline  50 mg Oral Daily    sevelamer carbonate  800 mg Oral TID WC   Continuous Infusions:    Diet Order             Diet Carb Modified Fluid consistency: Thin; Room service appropriate? Yes; Fluid restriction: 1500 mL Fluid  Diet effective now           Diet Carb Modified                  Labs  Recent Labs  Lab 07/10/22 2308 07/10/22 2320 07/11/22 0613 07/11/22 1144 07/12/22 0758 07/14/22 0543  WBC 11.1*  --  10.6* 10.6* 10.6* 5.8  HGB 11.2* 12.9* 9.7* 9.8* 10.3* 10.4*  HCT 36.9* 38.0* 28.5* 29.2* 30.6* 31.3*  PLT 346  --  322 361 326 280  MCV 94.9  --  87.7 86.9 86.4 87.9  MCH 28.8  --  29.8 29.2 29.1 29.2  MCHC 30.4  --  34.0 33.6 33.7 33.2  RDW 16.3*  --  15.8* 15.9* 16.1* 15.9*  LYMPHSABS 0.6*  --   --   --   --   --   MONOABS 0.7  --   --   --   --   --   EOSABS 0.0  --   --   --   --   --   BASOSABS 0.0  --   --   --   --   --     Recent Labs  Lab 07/10/22 2308 07/10/22 2320 07/11/22 0613 07/11/22 1144 07/11/22 1535 07/12/22 0252 07/12/22 0758 07/14/22 0543  NA 127*   < >  --  137 139 139 136 137  K 6.2*   < >  --  4.0 3.6 3.6 3.7 3.9  CL 84*  --   --  92* 94* 94* 93* 96*  CO2 16*  --   --  30 29 28 30 31   GLUCOSE 1,061*  --   --  385* 142* 217* 186* 202*  BUN 78*  --   --  84* 81* 83* 40* 30*  CREATININE 8.75*  --  8.86* 8.61* 8.66* 8.76* 5.39* 5.95*  CALCIUM 8.6*  --   --  8.6* 8.5* 8.7* 8.8* 8.9  AST 22  --   --   --   --   --   --   --   ALT 19  --   --   --   --   --   --   --   ALKPHOS 84  --   --   --   --   --   --   --   BILITOT 2.0*  --   --   --   --   --   --   --   ALBUMIN 3.6  --   --   --   --   --   --   --   MG 2.3  --   --   --   --   --   --   --   PHOS  --   --   --  6.4*  --   --   --   --   HGBA1C  --   --  11.2*  --   --   --   --   --    < > = values in this interval not displayed.    Other culture-see note  Radiology Studies: No results found.  Signature  Lala Lund M.D on 07/16/2022 at 8:59 AM   -  To page go to [email or web address]

## 2022-07-17 ENCOUNTER — Inpatient Hospital Stay (HOSPITAL_COMMUNITY): Payer: Medicare Other

## 2022-07-17 DIAGNOSIS — R739 Hyperglycemia, unspecified: Secondary | ICD-10-CM | POA: Diagnosis not present

## 2022-07-17 LAB — LIPASE, BLOOD: Lipase: 33 U/L (ref 11–51)

## 2022-07-17 LAB — GLUCOSE, CAPILLARY
Glucose-Capillary: 176 mg/dL — ABNORMAL HIGH (ref 70–99)
Glucose-Capillary: 282 mg/dL — ABNORMAL HIGH (ref 70–99)
Glucose-Capillary: 274 mg/dL — ABNORMAL HIGH (ref 70–99)
Glucose-Capillary: 374 mg/dL — ABNORMAL HIGH (ref 70–99)
Glucose-Capillary: 327 mg/dL — ABNORMAL HIGH (ref 70–99)

## 2022-07-17 MED ORDER — CHLORHEXIDINE GLUCONATE CLOTH 2 % EX PADS: 6.0000 | MEDICATED_PAD | Freq: Every day | CUTANEOUS | Status: DC

## 2022-07-17 MED ORDER — LINACLOTIDE 145 MCG PO CAPS: 290.0000 ug | ORAL_CAPSULE | Freq: Every day | ORAL | Status: DC

## 2022-07-17 MED ORDER — TECHNETIUM TC 99M SULFUR COLLOID
2.0000 | Freq: Once | INTRAVENOUS | Status: AC | PRN
  Administered 2022-07-17: 2 via ORAL

## 2022-07-17 MED ORDER — METOCLOPRAMIDE HCL 10 MG PO TABS
10.0000 mg | ORAL_TABLET | Freq: Three times a day (TID) | ORAL | Status: DC
  Administered 2022-07-17 – 2022-07-25 (×31): 10 mg via ORAL
  Filled 2022-07-17 (×31): qty 1

## 2022-07-17 MED ORDER — INSULIN ASPART 100 UNIT/ML IJ SOLN: 0.0000 [IU] | Freq: Three times a day (TID) | INTRAMUSCULAR | Status: DC

## 2022-07-17 MED ORDER — LINACLOTIDE 145 MCG PO CAPS
290.0000 ug | ORAL_CAPSULE | Freq: Every day | ORAL | Status: AC
  Administered 2022-07-18 – 2022-07-19 (×2): 290 ug via ORAL
  Filled 2022-07-17 (×2): qty 2

## 2022-07-17 MED ORDER — METOPROLOL TARTRATE 50 MG PO TABS
50.0000 mg | ORAL_TABLET | Freq: Two times a day (BID) | ORAL | Status: DC
  Administered 2022-07-17 – 2022-07-21 (×8): 50 mg via ORAL
  Filled 2022-07-17 (×8): qty 1

## 2022-07-17 MED ORDER — LACTULOSE 10 GM/15ML PO SOLN
30.0000 g | Freq: Three times a day (TID) | ORAL | Status: DC
  Administered 2022-07-17 (×2): 30 g via ORAL
  Filled 2022-07-17 (×2): qty 60

## 2022-07-17 NOTE — Progress Notes (Signed)
[facility] Progress Note   Subjective:   Patient seen and examined at bedside.  Reports nausea and vomiting this AM. Waiting to go to a procedure.  Otherwise feeling ok.  Denies CP, SOB, abdominal pain and edema. Reports tolerating dialysis well yesterday.   Objective Vitals:   07/17/22 0357 07/17/22 0828 07/17/22 1128 07/17/22 1200  BP: (!) 175/68 (!) 171/76 123/71 (!) 126/55  Pulse: 80 81 69 66  Resp: 16 15 16    Temp: 98.2 F (36.8 C) 98.7 F (37.1 C) 99.1 F (37.3 C)   TempSrc: Oral Oral Oral   SpO2: 100% 100% 99% 97%  Weight:       Physical Exam General:chronically ill appearing male in NAD Heart:RRR, no mrg Lungs:CTAB, nml WOB on RA Abdomen:soft, NTND Extremities:no LE edema Dialysis Access: RU AVF +b/t   Filed Weights   07/12/22 0635 07/13/22 1951 07/16/22 1355  Weight: 75 kg 72.4 kg 71 kg    Intake/Output Summary (Last 24 hours) at 07/17/2022 1251 Last data filed at 07/17/2022 0727 Gross per 24 hour  Intake 120 ml  Output -680 ml  Net 800 ml    Additional Objective Labs: Basic Metabolic Panel: Recent Labs  Lab 07/11/22 1144 07/11/22 1535 07/12/22 0758 07/14/22 0543 07/16/22 0928  NA 137   < > 136 137 133*  K 4.0   < > 3.7 3.9 4.6  CL 92*   < > 93* 96* 92*  CO2 30   < > 30 31 26   GLUCOSE 385*   < > 186* 202* 353*  BUN 84*   < > 40* 30* 58*  CREATININE 8.61*   < > 5.39* 5.95* 8.52*  CALCIUM 8.6*   < > 8.8* 8.9 9.2  PHOS 6.4*  --   --   --   --    < > = values in this interval not displayed.   Liver Function Tests: Recent Labs  Lab 07/10/22 2308  AST 22  ALT 19  ALKPHOS 84  BILITOT 2.0*  PROT 6.3*  ALBUMIN 3.6   Recent Labs  Lab 07/10/22 2308 07/17/22 0909  LIPASE 39 33   CBC: Recent Labs  Lab 07/10/22 2308 07/10/22 2320 07/11/22 0613 07/11/22 1144 07/12/22 0758 07/14/22 0543 07/16/22 0928  WBC 11.1*  --  10.6* 10.6* 10.6* 5.8 5.5  NEUTROABS 9.7*  --   --   --   --   --   --   HGB 11.2*   < > 9.7* 9.8*  10.3* 10.4* 11.0*  HCT 36.9*   < > 28.5* 29.2* 30.6* 31.3* 32.6*  MCV 94.9  --  87.7 86.9 86.4 87.9 85.1  PLT 346  --  322 361 326 280 248   < > = values in this interval not displayed.    CBG: Recent Labs  Lab 07/16/22 2014 07/16/22 2357 07/17/22 0405 07/17/22 0824 07/17/22 1128  GLUCAP 120* 97 176* 282* 274*    Studies/Results: CT ABDOMEN PELVIS WO CONTRAST  Result Date: 07/17/2022 CLINICAL DATA:  Abdominal pain. Nausea and vomiting. Epigastric pain. EXAM: CT ABDOMEN AND PELVIS WITHOUT CONTRAST TECHNIQUE: Multidetector CT imaging of the abdomen and pelvis was performed following the standard protocol without IV contrast. RADIATION DOSE REDUCTION: This exam was performed according to the departmental dose-optimization program which includes automated exposure control, adjustment of the mA and/or kV according to patient size and/or use of iterative reconstruction technique. COMPARISON:  04/12/2021 FINDINGS: Lower chest: No acute abnormality. Hepatobiliary: No focal liver abnormality is  seen. No gallstones, gallbladder wall thickening, or biliary dilatation. Pancreas: Unremarkable. No pancreatic ductal dilatation or surrounding inflammatory changes. Spleen: Normal in size without focal abnormality. Adrenals/Urinary Tract: Adrenal glands are unremarkable. Kidneys are normal, without renal calculi, focal lesion, or hydronephrosis. Bladder is unremarkable. Stomach/Bowel: Stomach is within normal limits. No evidence of bowel wall thickening, distention, or inflammatory changes. Moderate amount of stool throughout the colon. Small hiatal hernia. Vascular/Lymphatic: Normal caliber abdominal aorta with mild atherosclerosis. No lymphadenopathy. Reproductive: Prostate is unremarkable. Other: No abdominal wall hernia or abnormality. No abdominopelvic ascites. Focal areas of soft tissue nodularity and edema in the subcutaneous fat of the anterior abdominal wall likely reflecting sequela of subcutaneous  injections. Musculoskeletal: No acute osseous abnormality. No aggressive osseous lesion. Chronic T12 vertebral body compression fracture. IMPRESSION: 1. No acute abdominal or pelvic pathology. 2. Moderate amount of stool throughout the colon. 3.  Aortic Atherosclerosis (ICD10-I70.0). Electronically Signed   By: Kathreen Devoid M.D.   On: 07/17/2022 08:56   DG Abd Portable 1V  Result Date: 07/16/2022 CLINICAL DATA:  Nausea. EXAM: PORTABLE ABDOMEN - 1 VIEW COMPARISON:  None Available. FINDINGS: The bowel gas pattern is normal. A small to moderate amount of stool within the colon and rectum noted. No radio-opaque calculi or other significant radiographic abnormality are seen. IMPRESSION: 1. No acute abnormalities. 2. Small to moderate amount of colonic/rectal stool. Electronically Signed   By: Margarette Canada M.D.   On: 07/16/2022 12:14    Medications:   aspirin EC  81 mg Oral q morning   atorvastatin  80 mg Oral Daily   bisacodyl  10 mg Rectal Daily   calcitRIOL  0.5 mcg Oral Q T,Th,Sa-HD   Chlorhexidine Gluconate Cloth  6 each Topical Q0600   clopidogrel  75 mg Oral q morning   darbepoetin (ARANESP) injection - DIALYSIS  100 mcg Intravenous Q Thu-HD   docusate sodium  200 mg Oral BID   heparin  5,000 Units Subcutaneous Q8H   insulin aspart  0-6 Units Subcutaneous Q4H   insulin glargine-yfgn  10 Units Subcutaneous Daily   lactulose  30 g Oral TID   linaclotide  290 mcg Oral QAC breakfast   metoCLOPramide  10 mg Oral TID AC & HS   metoprolol tartrate  50 mg Oral BID   pantoprazole (PROTONIX) IV  40 mg Intravenous Q12H   polyethylene glycol  17 g Oral BID   sertraline  50 mg Oral Daily   sevelamer carbonate  800 mg Oral TID WC    Dialysis Orders: TTS [facility] 4h  400/1.5  75kg  2/2 bath  Hep none RUA AVF - last HD 10/7, post wt 76.1 - last Hb 10.4 on 10/5 - mircera 120 ug q 4 wks, last  9/14 > due 10/12 thurs - venofer 50 mg weekly - calcitriol 0.5 ug tiw po tts   Home meds include -  amlodipine 10, aspirin, atovastatin, clopidogrel, humalog insulin/ degludec, hydralazine 50 tid, hctz 25 qd, labetalol 200 bid, pantoprazole, sevelamer carb 800 tid, prns/ vits/ supps      Problem/Plan: ESRD - HD TTS. HD yesterday off schedule.  Resume regular schedule tomorrow.  Nausea/vomiting - ?gastroparesis. CT abdomen +constipation, no acute abnormalities. Plan for gastric emptying study.  Per PMD. Orthostatic hypotension volume- noted near syncopal episode with PT 07/12/2022, BP meds adjusted :amlodipine, hydralazine and labetalol d/c.  On metoprolol 50mg  BID only.  Need to check standing BP pre and postdialysis DKA/ DM1 - BS 1061 at admit./ continue SSI.  try to avoid saline IVF"s (LR, NS) w/ the DKA protocol. If IVF"s needed to avoid low BS, recommend just using D5W or D510. Managed by primary.  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 ESA recently started here.  Aranesp 100 q. Thursday HD 10/12 given MBD ckd - CCa in range, PO4 6.4, cont binder with meals vitamin D on dialysis.  If phos not improved, ^Renvela dose.  Nutrition - renal diet w/fluid restrictions.    Jen Mow, PA-C [facility] 07/17/2022,12:51 PM  LOS: 6 days

## 2022-07-17 NOTE — Inpatient Diabetes Management (Signed)
Inpatient Diabetes Program Recommendations  AACE/ADA: New Consensus Statement on Inpatient Glycemic Control (2015)  Target Ranges:  Prepandial:   less than 140 mg/dL      Peak postprandial:   less than 180 mg/dL (1-2 hours)      Critically ill patients:  140 - 180 mg/dL   Lab Results  Component Value Date   GLUCAP 274 (H) 07/17/2022   HGBA1C 11.2 (H) 07/11/2022    Review of Glycemic Control  Diabetes history: DM1 (does NOT make any insulin; requires basal, carb coverage, and correction insulin) Outpatient Diabetes medications: Tresiba 8 units daily, Humalog 5 units TID with meals plus Humalog 1-2 units if needed for hyperglycemia Current orders for Inpatient glycemic control:  Semglee 10 units daily Novolog 0-6 units TID   Consult for insulin and Diabetic education. Please refer to Diabetes Coordinator note on 10/11 regarding details of our conversation and education with pt.   Note glucose trends elevated with pt. Pt has type 1 diabetes and requires insulin for carbohydrate coverage. May consider adding Novolog meal coverage.   Thanks,  Tama Headings RN, MSN, BC-ADM Inpatient Diabetes Coordinator Team Pager [phone] (8a-5p)

## 2022-07-17 NOTE — Progress Notes (Signed)
Dr. Bridgett Larsson, on-call for attending, notified per order of pt's SBP 160-173. MD promptly responded and endorsed to continue to monitor but at this time we would not treat.

## 2022-07-17 NOTE — Progress Notes (Addendum)
PROGRESS NOTE Louis Peters  LKG:[id number] DOB: 11-06-1965 DOA: 07/10/2022 PCP: Bernerd Limbo, MD   Brief Narrative/Hospital Course: 56 year old male with ESRD on HD TTS, diabetes mellitus type 1, HTN, HLP presented with nausea vomiting and epigastric abdominal pain.  Patient is on long-acting insulin and 5 units with meals, reported that his blood sugars have been especially high over the last few days.  Patient reported that he developed progressively worsening nausea and vomiting in the last 24 hours, unable to hold anything down . Patient was admitted treated for DKA, transitioned to subcu insulin blood sugar stabilized on the having intermittent hypoglycemia further adjusted meds.  He had near-syncopal syncopal episode with orthostatic hypotension> BP meds being tapered down   Subjective: Patient in bed appears to be in no distress denies any headache chest pain or abdominal pain, does have some nausea.   Assessment and Plan:   Orthostatic hypotension near syncope episode was on multiple blood pressure medications which have been held currently only on beta-blocker, stockings added, orthostatics much improved.  Continue to monitor.    Nausea vomiting: Had initially improved but now again persistent, has longstanding history of DM and ESRD, question gastroparesis, CT scan abdomen pelvis shows he has moderate stool burden placed on bowel regimen along with Reglan 3 times daily, IV PPI twice daily, will also get gastric emptying study.  If still not better will involve GI. ESRD on HD TTS, seen by nephrology underwent dialysis, reminded nephrology of due dialysis on 07/16/2022, they will take him as soon as possible. 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 calcium and phosphate elevated continue PO VRDA and Renvela per nephrology DM2 with DKA - DKA resolved, currently on Lantus and sliding scale monitor and adjust, diabetic  and insulin education   Lab Results  Component Value Date   HGBA1C 11.2 (H) 07/11/2022   CBG (last 3)  Recent Labs    07/16/22 2357 07/17/22 0405 07/17/22 0824  GLUCAP 97 176* 282*     DVT prophylaxis: Place TED hose Start: 07/16/22 0607 heparin injection 5,000 Units Start: 07/11/22 1400 SCDs Start: 07/11/22 0425 Code Status:   Code Status: Full Code Family Communication: plan of care discussed with patient/wife on phone  Patient status is: Inpatient because of orthostatic hypotension symptomatic Level of care: Telemetry Medical   Dispo: The patient is from: home.  Current activity level 3> stand with assist balance while standing and cannot march in place  Anticipated disposition: home  Objective: Vitals last 24 hrs: Vitals:   07/16/22 2300 07/17/22 0000 07/17/22 0357 07/17/22 0828  BP: (!) 168/84 (!) 173/81 (!) 175/68 (!) 171/76  Pulse: 78 76 80 81  Resp: 20  16 15   Temp: 98.4 F (36.9 C)  98.2 F (36.8 C) 98.7 F (37.1 C)  TempSrc: Oral  Oral Oral  SpO2: 96% 98% 100% 100%  Weight:       Weight change:   Physical Examination:  Awake Alert, No new F.N deficits, Normal affect [place].AT,PERRAL Supple Neck, No JVD,   Symmetrical Chest wall movement, Good air movement bilaterally, CTAB RRR,No Gallops, Rubs or new Murmurs,  +ve B.Sounds, Abd Soft, No tenderness,   No Cyanosis, Clubbing or edema     Medications reviewed:  Scheduled Meds:  aspirin EC  81 mg Oral q morning   atorvastatin  80 mg Oral Daily   bisacodyl  10 mg Rectal Daily   calcitRIOL  0.5 mcg Oral Q T,Th,Sa-HD  Chlorhexidine Gluconate Cloth  6 each Topical Q0600   clopidogrel  75 mg Oral q morning   darbepoetin (ARANESP) injection - DIALYSIS  100 mcg Intravenous Q Thu-HD   docusate sodium  200 mg Oral BID   heparin  5,000 Units Subcutaneous Q8H   insulin aspart  0-6 Units Subcutaneous Q4H   insulin glargine-yfgn  10 Units Subcutaneous Daily   lactulose  30 g Oral TID   linaclotide  290 mcg  Oral QAC breakfast   metoCLOPramide  10 mg Oral TID AC & HS   metoprolol tartrate  50 mg Oral BID   pantoprazole (PROTONIX) IV  40 mg Intravenous Q12H   polyethylene glycol  17 g Oral BID   sertraline  50 mg Oral Daily   sevelamer carbonate  800 mg Oral TID WC   Continuous Infusions:    Diet Order             Diet Carb Modified Fluid consistency: Thin; Room service appropriate? Yes; Fluid restriction: 1500 mL Fluid  Diet effective now           Diet Carb Modified                  Labs  Recent Labs  Lab 07/10/22 2308 07/10/22 2320 07/11/22 0613 07/11/22 1144 07/12/22 0758 07/14/22 0543 07/16/22 0928  WBC 11.1*  --  10.6* 10.6* 10.6* 5.8 5.5  HGB 11.2*   < > 9.7* 9.8* 10.3* 10.4* 11.0*  HCT 36.9*   < > 28.5* 29.2* 30.6* 31.3* 32.6*  PLT 346  --  322 361 326 280 248  MCV 94.9  --  87.7 86.9 86.4 87.9 85.1  MCH 28.8  --  29.8 29.2 29.1 29.2 28.7  MCHC 30.4  --  34.0 33.6 33.7 33.2 33.7  RDW 16.3*  --  15.8* 15.9* 16.1* 15.9* 15.6*  LYMPHSABS 0.6*  --   --   --   --   --   --   MONOABS 0.7  --   --   --   --   --   --   EOSABS 0.0  --   --   --   --   --   --   BASOSABS 0.0  --   --   --   --   --   --    < > = values in this interval not displayed.    Recent Labs  Lab 07/10/22 2308 07/10/22 2320 07/11/22 4742 07/11/22 1144 07/11/22 1535 07/12/22 0252 07/12/22 0758 07/14/22 0543 07/16/22 0928  NA 127*   < >  --  137 139 139 136 137 133*  K 6.2*   < >  --  4.0 3.6 3.6 3.7 3.9 4.6  CL 84*  --   --  92* 94* 94* 93* 96* 92*  CO2 16*  --   --  30 29 28 30 31 26   GLUCOSE 1,061*  --   --  385* 142* 217* 186* 202* 353*  BUN 78*  --   --  84* 81* 83* 40* 30* 58*  CREATININE 8.75*  --  8.86* 8.61* 8.66* 8.76* 5.39* 5.95* 8.52*  CALCIUM 8.6*  --   --  8.6* 8.5* 8.7* 8.8* 8.9 9.2  AST 22  --   --   --   --   --   --   --   --   ALT 19  --   --   --   --   --   --   --   --  ALKPHOS 84  --   --   --   --   --   --   --   --   BILITOT 2.0*  --   --   --   --   --    --   --   --   ALBUMIN 3.6  --   --   --   --   --   --   --   --   MG 2.3  --   --   --   --   --   --   --   --   PHOS  --   --   --  6.4*  --   --   --   --   --   HGBA1C  --   --  11.2*  --   --   --   --   --   --    < > = values in this interval not displayed.    Other culture-see note  Radiology Studies: CT ABDOMEN PELVIS WO CONTRAST  Result Date: 07/17/2022 CLINICAL DATA:  Abdominal pain. Nausea and vomiting. Epigastric pain. EXAM: CT ABDOMEN AND PELVIS WITHOUT CONTRAST TECHNIQUE: Multidetector CT imaging of the abdomen and pelvis was performed following the standard protocol without IV contrast. RADIATION DOSE REDUCTION: This exam was performed according to the departmental dose-optimization program which includes automated exposure control, adjustment of the mA and/or kV according to patient size and/or use of iterative reconstruction technique. COMPARISON:  04/12/2021 FINDINGS: Lower chest: No acute abnormality. Hepatobiliary: No focal liver abnormality is seen. No gallstones, gallbladder wall thickening, or biliary dilatation. Pancreas: Unremarkable. No pancreatic ductal dilatation or surrounding inflammatory changes. Spleen: Normal in size without focal abnormality. Adrenals/Urinary Tract: Adrenal glands are unremarkable. Kidneys are normal, without renal calculi, focal lesion, or hydronephrosis. Bladder is unremarkable. Stomach/Bowel: Stomach is within normal limits. No evidence of bowel wall thickening, distention, or inflammatory changes. Moderate amount of stool throughout the colon. Small hiatal hernia. Vascular/Lymphatic: Normal caliber abdominal aorta with mild atherosclerosis. No lymphadenopathy. Reproductive: Prostate is unremarkable. Other: No abdominal wall hernia or abnormality. No abdominopelvic ascites. Focal areas of soft tissue nodularity and edema in the subcutaneous fat of the anterior abdominal wall likely reflecting sequela of subcutaneous injections. Musculoskeletal:  No acute osseous abnormality. No aggressive osseous lesion. Chronic T12 vertebral body compression fracture. IMPRESSION: 1. No acute abdominal or pelvic pathology. 2. Moderate amount of stool throughout the colon. 3.  Aortic Atherosclerosis (ICD10-I70.0). Electronically Signed   By: Kathreen Devoid M.D.   On: 07/17/2022 08:56   DG Abd Portable 1V  Result Date: 07/16/2022 CLINICAL DATA:  Nausea. EXAM: PORTABLE ABDOMEN - 1 VIEW COMPARISON:  None Available. FINDINGS: The bowel gas pattern is normal. A small to moderate amount of stool within the colon and rectum noted. No radio-opaque calculi or other significant radiographic abnormality are seen. IMPRESSION: 1. No acute abnormalities. 2. Small to moderate amount of colonic/rectal stool. Electronically Signed   By: Margarette Canada M.D.   On: 07/16/2022 12:14    Signature  Lala Lund M.D on 07/17/2022 at 9:31 AM   -  To page go to [email or web address]

## 2022-07-17 NOTE — Progress Notes (Signed)
PT Cancellation Note  Patient Details Name: Louis Peters MRN: [id number] DOB: 1966-02-06   Cancelled Treatment:    Reason Eval/Treat Not Completed: Patient at procedure or test/unavailable  RN reports pt just left the floor for a test that will take ~4 hours. Will plan to see 10/17.   [place]  Office [phone]   Rexanne Mano 07/17/2022, 1:10 PM

## 2022-07-18 DIAGNOSIS — R739 Hyperglycemia, unspecified: Secondary | ICD-10-CM | POA: Diagnosis not present

## 2022-07-18 LAB — COMPREHENSIVE METABOLIC PANEL
ALT: 15 U/L (ref 0–44)
AST: 18 U/L (ref 15–41)
Albumin: 3.3 g/dL — ABNORMAL LOW (ref 3.5–5.0)
Alkaline Phosphatase: 66 U/L (ref 38–126)
Anion gap: 14 (ref 5–15)
BUN: 50 mg/dL — ABNORMAL HIGH (ref 6–20)
CO2: 26 mmol/L (ref 22–32)
Calcium: 9.3 mg/dL (ref 8.9–10.3)
Chloride: 97 mmol/L — ABNORMAL LOW (ref 98–111)
Creatinine, Ser: 7.03 mg/dL — ABNORMAL HIGH (ref 0.61–1.24)
GFR, Estimated: 8 mL/min — ABNORMAL LOW (ref >60)
Glucose, Bld: 372 mg/dL — ABNORMAL HIGH (ref 70–99)
Potassium: 4.3 mmol/L (ref 3.5–5.1)
Sodium: 137 mmol/L (ref 135–145)
Total Bilirubin: 0.8 mg/dL (ref 0.3–1.2)
Total Protein: 6.1 g/dL — ABNORMAL LOW (ref 6.5–8.1)

## 2022-07-18 LAB — GLUCOSE, CAPILLARY
Glucose-Capillary: 116 mg/dL — ABNORMAL HIGH (ref 70–99)
Glucose-Capillary: 183 mg/dL — ABNORMAL HIGH (ref 70–99)
Glucose-Capillary: 218 mg/dL — ABNORMAL HIGH (ref 70–99)
Glucose-Capillary: 224 mg/dL — ABNORMAL HIGH (ref 70–99)
Glucose-Capillary: 339 mg/dL — ABNORMAL HIGH (ref 70–99)
Glucose-Capillary: 339 mg/dL — ABNORMAL HIGH (ref 70–99)
Glucose-Capillary: 368 mg/dL — ABNORMAL HIGH (ref 70–99)

## 2022-07-18 LAB — RENAL FUNCTION PANEL
Albumin: 3 g/dL — ABNORMAL LOW (ref 3.5–5.0)
Anion gap: 11 (ref 5–15)
BUN: 51 mg/dL — ABNORMAL HIGH (ref 6–20)
CO2: 25 mmol/L (ref 22–32)
Calcium: 8.6 mg/dL — ABNORMAL LOW (ref 8.9–10.3)
Chloride: 100 mmol/L (ref 98–111)
Creatinine, Ser: 6.69 mg/dL — ABNORMAL HIGH (ref 0.61–1.24)
GFR, Estimated: 9 mL/min — ABNORMAL LOW (ref >60)
Glucose, Bld: 352 mg/dL — ABNORMAL HIGH (ref 70–99)
Phosphorus: 4.8 mg/dL — ABNORMAL HIGH (ref 2.5–4.6)
Potassium: 3.5 mmol/L (ref 3.5–5.1)
Sodium: 136 mmol/L (ref 135–145)

## 2022-07-18 LAB — CBC WITH DIFFERENTIAL/PLATELET
Abs Immature Granulocytes: 0.02 10*3/uL (ref 0.00–0.07)
Basophils Absolute: 0 10*3/uL (ref 0.0–0.1)
Basophils Relative: 1 %
Eosinophils Absolute: 0 10*3/uL (ref 0.0–0.5)
Eosinophils Relative: 0 %
HCT: 30.5 % — ABNORMAL LOW (ref 39.0–52.0)
Hemoglobin: 10.2 g/dL — ABNORMAL LOW (ref 13.0–17.0)
Immature Granulocytes: 0 %
Lymphocytes Relative: 24 %
Lymphs Abs: 1.3 10*3/uL (ref 0.7–4.0)
MCH: 29.1 pg (ref 26.0–34.0)
MCHC: 33.4 g/dL (ref 30.0–36.0)
MCV: 86.9 fL (ref 80.0–100.0)
Monocytes Absolute: 0.8 10*3/uL (ref 0.1–1.0)
Monocytes Relative: 15 %
Neutro Abs: 3.3 10*3/uL (ref 1.7–7.7)
Neutrophils Relative %: 60 %
Platelets: 232 10*3/uL (ref 150–400)
RBC: 3.51 MIL/uL — ABNORMAL LOW (ref 4.22–5.81)
RDW: 15.8 % — ABNORMAL HIGH (ref 11.5–15.5)
WBC: 5.5 10*3/uL (ref 4.0–10.5)
nRBC: 0 % (ref 0.0–0.2)

## 2022-07-18 LAB — CBC
HCT: 29.2 % — ABNORMAL LOW (ref 39.0–52.0)
Hemoglobin: 9.5 g/dL — ABNORMAL LOW (ref 13.0–17.0)
MCH: 29.1 pg (ref 26.0–34.0)
MCHC: 32.5 g/dL (ref 30.0–36.0)
MCV: 89.6 fL (ref 80.0–100.0)
Platelets: 230 10*3/uL (ref 150–400)
RBC: 3.26 MIL/uL — ABNORMAL LOW (ref 4.22–5.81)
RDW: 15.8 % — ABNORMAL HIGH (ref 11.5–15.5)
WBC: 5.3 10*3/uL (ref 4.0–10.5)
nRBC: 0 % (ref 0.0–0.2)

## 2022-07-18 LAB — C-REACTIVE PROTEIN: CRP: 0.5 mg/dL (ref <1.0)

## 2022-07-18 MED ORDER — LIDOCAINE HCL (PF) 1 % IJ SOLN: 5.0000 mL | INTRAMUSCULAR | Status: DC | PRN

## 2022-07-18 MED ORDER — INSULIN ASPART 100 UNIT/ML IJ SOLN: 0.0000 [IU] | Freq: Three times a day (TID) | INTRAMUSCULAR | Status: DC

## 2022-07-18 MED ORDER — HEPARIN SODIUM (PORCINE) 1000 UNIT/ML DIALYSIS: 1000.0000 [IU] | INTRAMUSCULAR | Status: DC | PRN

## 2022-07-18 MED ORDER — ALTEPLASE 2 MG IJ SOLR: 2.0000 mg | Freq: Once | INTRAMUSCULAR | Status: DC | PRN

## 2022-07-18 MED ORDER — INSULIN ASPART 100 UNIT/ML IJ SOLN
0.0000 [IU] | Freq: Three times a day (TID) | INTRAMUSCULAR | Status: DC
  Administered 2022-07-18: 9 [IU] via SUBCUTANEOUS
  Administered 2022-07-19 (×2): 1 [IU] via SUBCUTANEOUS
  Administered 2022-07-19: 2 [IU] via SUBCUTANEOUS
  Administered 2022-07-20: 1 [IU] via SUBCUTANEOUS
  Administered 2022-07-20: 2 [IU] via SUBCUTANEOUS
  Administered 2022-07-20: 3 [IU] via SUBCUTANEOUS
  Administered 2022-07-21: 1 [IU] via SUBCUTANEOUS
  Administered 2022-07-21: 3 [IU] via SUBCUTANEOUS
  Administered 2022-07-22: 1 [IU] via SUBCUTANEOUS
  Administered 2022-07-22: 7 [IU] via SUBCUTANEOUS
  Administered 2022-07-22: 5 [IU] via SUBCUTANEOUS
  Administered 2022-07-23: 2 [IU] via SUBCUTANEOUS
  Administered 2022-07-23: 3 [IU] via SUBCUTANEOUS
  Administered 2022-07-24: 2 [IU] via SUBCUTANEOUS
  Administered 2022-07-24: 5 [IU] via SUBCUTANEOUS
  Administered 2022-07-24 – 2022-07-26 (×4): 2 [IU] via SUBCUTANEOUS
  Administered 2022-07-26: 1 [IU] via SUBCUTANEOUS
  Administered 2022-07-26 – 2022-07-27 (×2): 3 [IU] via SUBCUTANEOUS
  Administered 2022-07-28 (×2): 9 [IU] via SUBCUTANEOUS
  Administered 2022-07-29: 1 [IU] via SUBCUTANEOUS
  Administered 2022-07-29: 3 [IU] via SUBCUTANEOUS
  Administered 2022-07-29: 1 [IU] via SUBCUTANEOUS
  Administered 2022-07-30: 3 [IU] via SUBCUTANEOUS
  Administered 2022-07-30: 7 [IU] via SUBCUTANEOUS

## 2022-07-18 MED ORDER — LIDOCAINE-PRILOCAINE 2.5-2.5 % EX CREA: 1.0000 | TOPICAL_CREAM | CUTANEOUS | Status: DC | PRN

## 2022-07-18 MED ORDER — HYDRALAZINE HCL 20 MG/ML IJ SOLN
10.0000 mg | Freq: Four times a day (QID) | INTRAMUSCULAR | Status: DC | PRN
  Administered 2022-07-18 – 2022-07-30 (×5): 10 mg via INTRAVENOUS
  Filled 2022-07-18 (×5): qty 1

## 2022-07-18 MED ORDER — PENTAFLUOROPROP-TETRAFLUOROETH EX AERO: 1.0000 | INHALATION_SPRAY | CUTANEOUS | Status: DC | PRN

## 2022-07-18 MED ORDER — ANTICOAGULANT SODIUM CITRATE 4% (200MG/5ML) IV SOLN: 5.0000 mL | Status: DC | PRN

## 2022-07-18 MED ORDER — FERRIC CITRATE 1 GM 210 MG(FE) PO TABS
420.0000 mg | ORAL_TABLET | Freq: Three times a day (TID) | ORAL | Status: DC
  Administered 2022-07-18 – 2022-07-26 (×24): 420 mg via ORAL
  Filled 2022-07-18 (×25): qty 2

## 2022-07-18 MED ORDER — INSULIN GLARGINE-YFGN 100 UNIT/ML ~~LOC~~ SOLN
15.0000 [IU] | Freq: Every day | SUBCUTANEOUS | Status: DC
  Administered 2022-07-18 – 2022-07-22 (×5): 15 [IU] via SUBCUTANEOUS
  Filled 2022-07-18 (×5): qty 0.15

## 2022-07-18 NOTE — Progress Notes (Signed)
Received patient in bed to unit.  Alert and oriented.  Informed consent signed and in chart.   Treatment completed: 1435  Patient tolerated well.  Transported back to the room  Alert, without acute distress.  Hand-off given to patient's nurse.   Access used: R arm fistula  Access issues: n/a  Total UF removed: -0.4 Medication(s) given: n/a Post HD weight: 68.6KG    07/18/22 1435  Vitals  Temp 97.9 F (36.6 C)  Temp Source Oral  BP (!) 206/93  MAP (mmHg) 126  Pulse Rate 69  ECG Heart Rate 67  Resp (!) 23  Oxygen Therapy  SpO2 100 %  During Treatment Monitoring  Intra-Hemodialysis Comments Tx completed  Post Treatment  Dialyzer Clearance Lightly streaked  Duration of HD Treatment -hour(s) 4 hour(s)  Hemodialysis Intake (mL) 200 mL  Liters Processed 84  Fluid Removed -0.4 mL  Tolerated HD Treatment No (Comment)  Fistula / Graft Right Upper arm Arteriovenous fistula  No placement date or time found.   Placed prior to admission: Yes  Orientation: Right  Access Location: Upper arm  Access Type: Arteriovenous fistula  Site Condition No complications         [facility]

## 2022-07-18 NOTE — Progress Notes (Signed)
Dr, Bridgett Larsson [facility] informed of patient CBG at [phone] and now 339

## 2022-07-18 NOTE — Progress Notes (Signed)
PT Cancellation Note  Patient Details Name: VERDUN RACKLEY MRN: [id number] DOB: 16-Oct-1965   Cancelled Treatment:    Reason Eval/Treat Not Completed: Patient at procedure or test/unavailable - HD  Stacie Glaze, PT DPT Acute Rehabilitation Services Pager [phone]  Office [phone]    [place] E Ruffin Pyo 07/18/2022, 11:31 AM

## 2022-07-18 NOTE — Care Management (Signed)
  Transition of Care [facility]) Screening Note   Patient Details  Name: Louis Peters Date of Birth: Dec 05, 1965   Transition of Care [facility]) CM/SW Contact:    Carles Collet, RN Phone Number: 07/18/2022, 1:05 PM    Transition of Care Department [facility]) has reviewed patient and no TOC needs have been identified at this time. We will continue to monitor patient advancement through interdisciplinary progression rounds. If new patient transition needs arise, please place a TOC consult.

## 2022-07-18 NOTE — Progress Notes (Signed)
   07/18/22 1050  Vitals  BP (!) 94/59  MAP (mmHg) 70  Pulse Rate 79  ECG Heart Rate 73  Resp 20  Oxygen Therapy  SpO2 100 %  O2 Device Room Air  During Treatment Monitoring  Intra-Hemodialysis Comments See progress note (Fluid removal stopped)   Patient become very lethargic, cold , clammy. RN stopped pulling fluid and administered 200 ML bolus and patient became more alert and was able to answer questions. PA Penninger notified. Will continue to monitor.

## 2022-07-18 NOTE — Progress Notes (Signed)
[facility] Progress Note   Subjective:   Patient seen on HD-  had acute drop in BP and had sxms   Objective Vitals:   07/17/22 2304 07/18/22 0359 07/18/22 0801 07/18/22 1000  BP: (!) 173/73 (!) 153/67 (!) 142/76 (!) 145/86  Pulse: 80 70 70 63  Resp: 20 13 16 13   Temp: 98.6 F (37 C) 98.5 F (36.9 C) 98.7 F (37.1 C) 98.6 F (37 C)  TempSrc: Oral Oral Oral Oral  SpO2:   100% 100%  Weight:       Physical Exam General:chronically ill appearing male in NAD Heart:RRR, no mrg Lungs:CTAB, nml WOB on RA Abdomen:soft, NTND Extremities:no LE edema Dialysis Access: RU AVF +b/t   Filed Weights   07/12/22 0635 07/13/22 1951 07/16/22 1355  Weight: 75 kg 72.4 kg 71 kg   No intake or output data in the 24 hours ending 07/18/22 1101   Additional Objective Labs: Basic Metabolic Panel: Recent Labs  Lab 07/11/22 1144 07/11/22 1535 07/14/22 0543 07/16/22 0928 07/18/22 0300  NA 137   < > 137 133* 137  K 4.0   < > 3.9 4.6 4.3  CL 92*   < > 96* 92* 97*  CO2 30   < > 31 26 26   GLUCOSE 385*   < > 202* 353* 372*  BUN 84*   < > 30* 58* 50*  CREATININE 8.61*   < > 5.95* 8.52* 7.03*  CALCIUM 8.6*   < > 8.9 9.2 9.3  PHOS 6.4*  --   --   --   --    < > = values in this interval not displayed.   Liver Function Tests: Recent Labs  Lab 07/18/22 0300  AST 18  ALT 15  ALKPHOS 66  BILITOT 0.8  PROT 6.1*  ALBUMIN 3.3*   Recent Labs  Lab 07/17/22 0909  LIPASE 33   CBC: Recent Labs  Lab 07/12/22 0758 07/14/22 0543 07/16/22 0928 07/18/22 0300 07/18/22 1000  WBC 10.6* 5.8 5.5 5.5 5.3  NEUTROABS  --   --   --  3.3  --   HGB 10.3* 10.4* 11.0* 10.2* 9.5*  HCT 30.6* 31.3* 32.6* 30.5* 29.2*  MCV 86.4 87.9 85.1 86.9 89.6  PLT 326 280 248 232 230    CBG: Recent Labs  Lab 07/17/22 1743 07/17/22 2001 07/17/22 2359 07/18/22 0358 07/18/22 0800  GLUCAP 374* 327* 339* 368* 339*    Studies/Results: NM GASTRIC EMPTYING  Result Date: 07/17/2022 CLINICAL  DATA:  Diabetes and end-stage renal disease. Nausea and vomiting with epigastric abdominal pain. EXAM: NUCLEAR MEDICINE GASTRIC EMPTYING SCAN TECHNIQUE: After oral ingestion of radiolabeled meal, sequential abdominal images were obtained for 4 hours. Percentage of activity emptying the stomach was calculated at 1 hour, 2 hour, 3 hour, and 4 hours. RADIOPHARMACEUTICALS:  2.0 mCi Tc-55m in standardized meal consisting of scrambled eggs and water COMPARISON:  07/17/2022 CT scan FINDINGS: Expected location of the stomach in the left upper quadrant. Ingested meal empties the stomach poorly over the course of the study. 10 emptied at 1 hr ( normal >= 10%) 10 emptied at 2 hr ( normal >= 40%) 11 emptied at 3 hr ( normal >= 70%) 11 emptied at 4 hr ( normal >= 90%) IMPRESSION: Substantially delayed gastric emptying with only 11% emptying at 4 hours. Electronically Signed   By: Van Clines M.D.   On: 07/17/2022 17:34   CT ABDOMEN PELVIS WO CONTRAST  Result Date: 07/17/2022 CLINICAL DATA:  Abdominal pain. Nausea and vomiting. Epigastric pain. EXAM: CT ABDOMEN AND PELVIS WITHOUT CONTRAST TECHNIQUE: Multidetector CT imaging of the abdomen and pelvis was performed following the standard protocol without IV contrast. RADIATION DOSE REDUCTION: This exam was performed according to the departmental dose-optimization program which includes automated exposure control, adjustment of the mA and/or kV according to patient size and/or use of iterative reconstruction technique. COMPARISON:  04/12/2021 FINDINGS: Lower chest: No acute abnormality. Hepatobiliary: No focal liver abnormality is seen. No gallstones, gallbladder wall thickening, or biliary dilatation. Pancreas: Unremarkable. No pancreatic ductal dilatation or surrounding inflammatory changes. Spleen: Normal in size without focal abnormality. Adrenals/Urinary Tract: Adrenal glands are unremarkable. Kidneys are normal, without renal calculi, focal lesion, or  hydronephrosis. Bladder is unremarkable. Stomach/Bowel: Stomach is within normal limits. No evidence of bowel wall thickening, distention, or inflammatory changes. Moderate amount of stool throughout the colon. Small hiatal hernia. Vascular/Lymphatic: Normal caliber abdominal aorta with mild atherosclerosis. No lymphadenopathy. Reproductive: Prostate is unremarkable. Other: No abdominal wall hernia or abnormality. No abdominopelvic ascites. Focal areas of soft tissue nodularity and edema in the subcutaneous fat of the anterior abdominal wall likely reflecting sequela of subcutaneous injections. Musculoskeletal: No acute osseous abnormality. No aggressive osseous lesion. Chronic T12 vertebral body compression fracture. IMPRESSION: 1. No acute abdominal or pelvic pathology. 2. Moderate amount of stool throughout the colon. 3.  Aortic Atherosclerosis (ICD10-I70.0). Electronically Signed   By: Kathreen Devoid M.D.   On: 07/17/2022 08:56   DG Abd Portable 1V  Result Date: 07/16/2022 CLINICAL DATA:  Nausea. EXAM: PORTABLE ABDOMEN - 1 VIEW COMPARISON:  None Available. FINDINGS: The bowel gas pattern is normal. A small to moderate amount of stool within the colon and rectum noted. No radio-opaque calculi or other significant radiographic abnormality are seen. IMPRESSION: 1. No acute abnormalities. 2. Small to moderate amount of colonic/rectal stool. Electronically Signed   By: Margarette Canada M.D.   On: 07/16/2022 12:14    Medications:  anticoagulant sodium citrate      aspirin EC  81 mg Oral q morning   atorvastatin  80 mg Oral Daily   bisacodyl  10 mg Rectal Daily   calcitRIOL  0.5 mcg Oral Q T,Th,Sa-HD   clopidogrel  75 mg Oral q morning   darbepoetin (ARANESP) injection - DIALYSIS  100 mcg Intravenous Q Thu-HD   docusate sodium  200 mg Oral BID   heparin  5,000 Units Subcutaneous Q8H   insulin aspart  0-9 Units Subcutaneous TID WC   insulin glargine-yfgn  15 Units Subcutaneous Daily   linaclotide  290 mcg  Oral QAC breakfast   metoCLOPramide  10 mg Oral TID AC & HS   metoprolol tartrate  50 mg Oral BID   pantoprazole (PROTONIX) IV  40 mg Intravenous Q12H   polyethylene glycol  17 g Oral BID   sertraline  50 mg Oral Daily   sevelamer carbonate  800 mg Oral TID WC    Dialysis Orders: TTS [facility] 4h  400/1.5  75kg  2/2 bath  Hep none RUA AVF - last HD 10/7, post wt 76.1 - last Hb 10.4 on 10/5 - mircera 120 ug q 4 wks, last  9/14 > due 10/12 thurs - venofer 50 mg weekly - calcitriol 0.5 ug tiw po tts   Home meds include - amlodipine 10, aspirin, atovastatin, clopidogrel, humalog insulin/ degludec, hydralazine 50 tid, hctz 25 qd, labetalol 200 bid, pantoprazole, sevelamer carb 800 tid, prns/ vits/ supps      Problem/Plan: ESRD -  HD TTS. HD Sunday off schedule.  Resume regular schedule today via AVF Nausea/vomiting - ?gastroparesis. CT abdomen +constipation, no acute abnormalities. Plan for gastric emptying study.  Per PMD. Now on reglan and antiemetics  Orthostatic hypotension volume- noted near syncopal episode with PT 07/12/2022, BP meds adjusted :amlodipine, hydralazine and labetalol d/c.  On metoprolol 50mg  BID only.  gentle UF DKA/ DM1 - BS 1061 at admit./ continue SSI.  try to avoid saline IVF"s (LR, NS) w/ the DKA protocol. If IVF"s needed to avoid low BS, recommend just using D5W or D510. Managed by primary.  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 ESA recently started here.  Aranesp 100 q. 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  If phos not improved, ^Renvela dose. Actually seems like renvela would be a bad choice for binder as it constipates-  will change to [place]  Nutrition - renal diet w/fluid restrictions.   [facility] 07/18/2022,11:01 AM  LOS: 7 days

## 2022-07-18 NOTE — Procedures (Signed)
Patient was seen on dialysis and the procedure was supervised.  BFR 350  Via AVF BP is  129/70.   Patient appears to be tolerating treatment well  Louis Meckel 07/18/2022

## 2022-07-18 NOTE — Plan of Care (Signed)
  Problem: Education: Goal: Ability to describe self-care measures that may prevent or decrease complications (Diabetes Survival Skills Education) will improve Outcome: Progressing Goal: Individualized Educational Video(s) Outcome: Progressing   Problem: Coping: Goal: Ability to adjust to condition or change in health will improve Outcome: Progressing   Problem: Fluid Volume: Goal: Ability to maintain a balanced intake and output will improve Outcome: Progressing   Problem: Health Behavior/Discharge Planning: Goal: Ability to identify and utilize available resources and services will improve Outcome: Progressing Goal: Ability to manage health-related needs will improve Outcome: Progressing   Problem: Metabolic: Goal: Ability to maintain appropriate glucose levels will improve Outcome: Progressing   Problem: Nutritional: Goal: Maintenance of adequate nutrition will improve Outcome: Progressing Goal: Progress toward achieving an optimal weight will improve Outcome: Progressing   Problem: Tissue Perfusion: Goal: Adequacy of tissue perfusion will improve Outcome: Progressing   Problem: Education: Goal: Ability to describe self-care measures that may prevent or decrease complications (Diabetes Survival Skills Education) will improve Outcome: Progressing Goal: Individualized Educational Video(s) Outcome: Progressing   Problem: Cardiac: Goal: Ability to maintain an adequate cardiac output will improve Outcome: Progressing   Problem: Health Behavior/Discharge Planning: Goal: Ability to identify and utilize available resources and services will improve Outcome: Progressing Goal: Ability to manage health-related needs will improve Outcome: Progressing   Problem: Metabolic: Goal: Ability to maintain appropriate glucose levels will improve Outcome: Progressing   Problem: Fluid Volume: Goal: Ability to achieve a balanced intake and output will improve Outcome: Progressing    Problem: Respiratory: Goal: Will regain and/or maintain adequate ventilation Outcome: Progressing   Problem: Nutritional: Goal: Maintenance of adequate nutrition will improve Outcome: Progressing Goal: Maintenance of adequate weight for body size and type will improve Outcome: Progressing   Problem: Education: Goal: Knowledge of General Education information will improve Description: Including pain rating scale, medication(s)/side effects and non-pharmacologic comfort measures Outcome: Progressing   Problem: Clinical Measurements: Goal: Ability to maintain clinical measurements within normal limits will improve Outcome: Progressing Goal: Will remain free from infection Outcome: Progressing Goal: Diagnostic test results will improve Outcome: Progressing Goal: Respiratory complications will improve Outcome: Progressing Goal: Cardiovascular complication will be avoided Outcome: Progressing

## 2022-07-18 NOTE — Progress Notes (Signed)
PROGRESS NOTE [facility]  UKG:[id number] DOB: October 29, 1965 DOA: 07/10/2022 PCP: Bernerd Limbo, MD   Brief Narrative/Hospital Course:  56 year old male with ESRD on HD TTS, diabetes mellitus type 1, HTN, HLP presented with nausea vomiting and epigastric abdominal pain.  Patient is on long-acting insulin and 5 units with meals, reported that his blood sugars have been especially high over the last few days.  Patient reported that he developed progressively worsening nausea and vomiting in the last 24 hours, unable to hold anything down . Patient was admitted treated for DKA, transitioned to subcu insulin blood sugar stabilized on the having intermittent hypoglycemia further adjusted meds.  He had near-syncopal syncopal episode with orthostatic hypotension> BP meds being tapered down   Subjective:  Patient in bed, appears comfortable, denies any headache, no fever, no chest pain or pressure, no shortness of breath , no abdominal pain. No focal weakness.   Assessment and Plan:   Orthostatic hypotension near syncope episode was on multiple blood pressure medications which have been held currently only on beta-blocker, stockings added, orthostatics much improved.  Continue to monitor.     New diagnosis of diabetic gastroparesis causing nausea vomiting: Chronic intermittent persistent nausea and nausea vomiting, work-up here suggest gastroparesis on 07/17/2022, placed on Premeal Reglan along with supportive care for nausea with much improvement advance diet, if stable discharge in the next 1 to 2 days.  Patient feels a whole lot better.  ESRD on HD TTS, seen by nephrology currently on MWF schedule.  Anemia of ESRD ESA started recently hemoglobin is stable-much intermittently  Metabolic bone disease from CKD calcium and phosphate elevated continue PO VRDA and Renvela per nephrology  DM2 with DKA - DKA resolved, currently on Lantus and sliding scale monitor and adjust, diabetic and insulin  education, insulin dose adjusted on 07/18/2022 for better control.   Lab Results  Component Value Date   HGBA1C 11.2 (H) 07/11/2022   CBG (last 3)  Recent Labs    07/17/22 2359 07/18/22 0358 07/18/22 0800  GLUCAP 339* 368* 339*     DVT prophylaxis: Place TED hose Start: 07/16/22 0607 heparin injection 5,000 Units Start: 07/11/22 1400 SCDs Start: 07/11/22 0425 Code Status:   Code Status: Full Code Family Communication: plan of care discussed with patient/wife on phone  Patient status is: Inpatient because of orthostatic hypotension symptomatic Level of care: Telemetry Medical   Dispo: The patient is from: home.  Current activity level 3> stand with assist balance while standing and cannot march in place  Anticipated disposition: home  Objective: Vitals last 24 hrs: Vitals:   07/17/22 2001 07/17/22 2304 07/18/22 0359 07/18/22 0801  BP: (!) 155/75 (!) 173/73 (!) 153/67 (!) 142/76  Pulse: 75 80 70 70  Resp: 16 20 13 16   Temp: 98.7 F (37.1 C) 98.6 F (37 C) 98.5 F (36.9 C) 98.7 F (37.1 C)  TempSrc: Oral Oral Oral Oral  SpO2:    100%  Weight:       Weight change:   Physical Examination:  Awake Alert, No new F.N deficits,  [place].AT,PERRAL Supple Neck, No JVD,   Symmetrical Chest wall movement, Good air movement bilaterally, CTAB RRR,No Gallops, Rubs or new Murmurs,  +ve B.Sounds, Abd Soft, No tenderness,   No Cyanosis, Clubbing or edema    Medications reviewed:  Scheduled Meds:  aspirin EC  81 mg Oral q morning   atorvastatin  80 mg Oral Daily   bisacodyl  10 mg Rectal Daily   calcitRIOL  0.5  mcg Oral Q T,Th,Sa-HD   clopidogrel  75 mg Oral q morning   darbepoetin (ARANESP) injection - DIALYSIS  100 mcg Intravenous Q Thu-HD   docusate sodium  200 mg Oral BID   heparin  5,000 Units Subcutaneous Q8H   insulin aspart  0-9 Units Subcutaneous TID WC   insulin glargine-yfgn  15 Units Subcutaneous Daily   linaclotide  290 mcg Oral QAC breakfast   metoCLOPramide   10 mg Oral TID AC & HS   metoprolol tartrate  50 mg Oral BID   pantoprazole (PROTONIX) IV  40 mg Intravenous Q12H   polyethylene glycol  17 g Oral BID   sertraline  50 mg Oral Daily   sevelamer carbonate  800 mg Oral TID WC   Continuous Infusions:    Diet Order             Diet Carb Modified Fluid consistency: Thin; Room service appropriate? Yes; Fluid restriction: 1500 mL Fluid  Diet effective now           Diet Carb Modified                  Labs  Recent Labs  Lab 07/11/22 1144 07/12/22 0758 07/14/22 0543 07/16/22 0928 07/18/22 0300  WBC 10.6* 10.6* 5.8 5.5 5.5  HGB 9.8* 10.3* 10.4* 11.0* 10.2*  HCT 29.2* 30.6* 31.3* 32.6* 30.5*  PLT 361 326 280 248 232  MCV 86.9 86.4 87.9 85.1 86.9  MCH 29.2 29.1 29.2 28.7 29.1  MCHC 33.6 33.7 33.2 33.7 33.4  RDW 15.9* 16.1* 15.9* 15.6* 15.8*  LYMPHSABS  --   --   --   --  1.3  MONOABS  --   --   --   --  0.8  EOSABS  --   --   --   --  0.0  BASOSABS  --   --   --   --  0.0    Recent Labs  Lab 07/11/22 1144 07/11/22 1535 07/12/22 0252 07/12/22 0758 07/14/22 0543 07/16/22 0928 07/18/22 0300  NA 137   < > 139 136 137 133* 137  K 4.0   < > 3.6 3.7 3.9 4.6 4.3  CL 92*   < > 94* 93* 96* 92* 97*  CO2 30   < > 28 30 31 26 26   GLUCOSE 385*   < > 217* 186* 202* 353* 372*  BUN 84*   < > 83* 40* 30* 58* 50*  CREATININE 8.61*   < > 8.76* 5.39* 5.95* 8.52* 7.03*  CALCIUM 8.6*   < > 8.7* 8.8* 8.9 9.2 9.3  AST  --   --   --   --   --   --  18  ALT  --   --   --   --   --   --  15  ALKPHOS  --   --   --   --   --   --  66  BILITOT  --   --   --   --   --   --  0.8  ALBUMIN  --   --   --   --   --   --  3.3*  PHOS 6.4*  --   --   --   --   --   --   CRP  --   --   --   --   --   --  0.5   < > =  values in this interval not displayed.    Other culture-see note  Radiology Studies: NM GASTRIC EMPTYING  Result Date: 07/17/2022 CLINICAL DATA:  Diabetes and end-stage renal disease. Nausea and vomiting with epigastric  abdominal pain. EXAM: NUCLEAR MEDICINE GASTRIC EMPTYING SCAN TECHNIQUE: After oral ingestion of radiolabeled meal, sequential abdominal images were obtained for 4 hours. Percentage of activity emptying the stomach was calculated at 1 hour, 2 hour, 3 hour, and 4 hours. RADIOPHARMACEUTICALS:  2.0 mCi Tc-9m in standardized meal consisting of scrambled eggs and water COMPARISON:  07/17/2022 CT scan FINDINGS: Expected location of the stomach in the left upper quadrant. Ingested meal empties the stomach poorly over the course of the study. 10 emptied at 1 hr ( normal >= 10%) 10 emptied at 2 hr ( normal >= 40%) 11 emptied at 3 hr ( normal >= 70%) 11 emptied at 4 hr ( normal >= 90%) IMPRESSION: Substantially delayed gastric emptying with only 11% emptying at 4 hours. Electronically Signed   By: Van Clines M.D.   On: 07/17/2022 17:34   CT ABDOMEN PELVIS WO CONTRAST  Result Date: 07/17/2022 CLINICAL DATA:  Abdominal pain. Nausea and vomiting. Epigastric pain. EXAM: CT ABDOMEN AND PELVIS WITHOUT CONTRAST TECHNIQUE: Multidetector CT imaging of the abdomen and pelvis was performed following the standard protocol without IV contrast. RADIATION DOSE REDUCTION: This exam was performed according to the departmental dose-optimization program which includes automated exposure control, adjustment of the mA and/or kV according to patient size and/or use of iterative reconstruction technique. COMPARISON:  04/12/2021 FINDINGS: Lower chest: No acute abnormality. Hepatobiliary: No focal liver abnormality is seen. No gallstones, gallbladder wall thickening, or biliary dilatation. Pancreas: Unremarkable. No pancreatic ductal dilatation or surrounding inflammatory changes. Spleen: Normal in size without focal abnormality. Adrenals/Urinary Tract: Adrenal glands are unremarkable. Kidneys are normal, without renal calculi, focal lesion, or hydronephrosis. Bladder is unremarkable. Stomach/Bowel: Stomach is within normal limits. No  evidence of bowel wall thickening, distention, or inflammatory changes. Moderate amount of stool throughout the colon. Small hiatal hernia. Vascular/Lymphatic: Normal caliber abdominal aorta with mild atherosclerosis. No lymphadenopathy. Reproductive: Prostate is unremarkable. Other: No abdominal wall hernia or abnormality. No abdominopelvic ascites. Focal areas of soft tissue nodularity and edema in the subcutaneous fat of the anterior abdominal wall likely reflecting sequela of subcutaneous injections. Musculoskeletal: No acute osseous abnormality. No aggressive osseous lesion. Chronic T12 vertebral body compression fracture. IMPRESSION: 1. No acute abdominal or pelvic pathology. 2. Moderate amount of stool throughout the colon. 3.  Aortic Atherosclerosis (ICD10-I70.0). Electronically Signed   By: Kathreen Devoid M.D.   On: 07/17/2022 08:56   DG Abd Portable 1V  Result Date: 07/16/2022 CLINICAL DATA:  Nausea. EXAM: PORTABLE ABDOMEN - 1 VIEW COMPARISON:  None Available. FINDINGS: The bowel gas pattern is normal. A small to moderate amount of stool within the colon and rectum noted. No radio-opaque calculi or other significant radiographic abnormality are seen. IMPRESSION: 1. No acute abnormalities. 2. Small to moderate amount of colonic/rectal stool. Electronically Signed   By: Margarette Canada M.D.   On: 07/16/2022 12:14    Signature  Lala Lund M.D on 07/18/2022 at 8:34 AM   -  To page go to [email or web address]

## 2022-07-19 DIAGNOSIS — N186 End stage renal disease: Secondary | ICD-10-CM | POA: Diagnosis not present

## 2022-07-19 DIAGNOSIS — B029 Zoster without complications: Secondary | ICD-10-CM | POA: Diagnosis not present

## 2022-07-19 DIAGNOSIS — R739 Hyperglycemia, unspecified: Secondary | ICD-10-CM | POA: Diagnosis not present

## 2022-07-19 LAB — CBC WITH DIFFERENTIAL/PLATELET
Abs Immature Granulocytes: 0.01 10*3/uL (ref 0.00–0.07)
Basophils Absolute: 0 10*3/uL (ref 0.0–0.1)
Basophils Relative: 1 %
Eosinophils Absolute: 0 10*3/uL (ref 0.0–0.5)
Eosinophils Relative: 1 %
HCT: 31.8 % — ABNORMAL LOW (ref 39.0–52.0)
Hemoglobin: 10.3 g/dL — ABNORMAL LOW (ref 13.0–17.0)
Immature Granulocytes: 0 %
Lymphocytes Relative: 38 %
Lymphs Abs: 2.4 10*3/uL (ref 0.7–4.0)
MCH: 28.7 pg (ref 26.0–34.0)
MCHC: 32.4 g/dL (ref 30.0–36.0)
MCV: 88.6 fL (ref 80.0–100.0)
Monocytes Absolute: 0.7 10*3/uL (ref 0.1–1.0)
Monocytes Relative: 10 %
Neutro Abs: 3.3 10*3/uL (ref 1.7–7.7)
Neutrophils Relative %: 50 %
Platelets: 248 10*3/uL (ref 150–400)
RBC: 3.59 MIL/uL — ABNORMAL LOW (ref 4.22–5.81)
RDW: 15.7 % — ABNORMAL HIGH (ref 11.5–15.5)
WBC: 6.5 10*3/uL (ref 4.0–10.5)
nRBC: 0 % (ref 0.0–0.2)

## 2022-07-19 LAB — COMPREHENSIVE METABOLIC PANEL
ALT: 15 U/L (ref 0–44)
AST: 19 U/L (ref 15–41)
Albumin: 3.1 g/dL — ABNORMAL LOW (ref 3.5–5.0)
Alkaline Phosphatase: 61 U/L (ref 38–126)
Anion gap: 11 (ref 5–15)
BUN: 27 mg/dL — ABNORMAL HIGH (ref 6–20)
CO2: 30 mmol/L (ref 22–32)
Calcium: 9.3 mg/dL (ref 8.9–10.3)
Chloride: 99 mmol/L (ref 98–111)
Creatinine, Ser: 5.13 mg/dL — ABNORMAL HIGH (ref 0.61–1.24)
GFR, Estimated: 12 mL/min — ABNORMAL LOW (ref >60)
Glucose, Bld: 185 mg/dL — ABNORMAL HIGH (ref 70–99)
Potassium: 3.8 mmol/L (ref 3.5–5.1)
Sodium: 140 mmol/L (ref 135–145)
Total Bilirubin: 0.6 mg/dL (ref 0.3–1.2)
Total Protein: 6 g/dL — ABNORMAL LOW (ref 6.5–8.1)

## 2022-07-19 LAB — C-REACTIVE PROTEIN: CRP: <0.5 mg/dL (ref <1.0)

## 2022-07-19 LAB — GLUCOSE, CAPILLARY
Glucose-Capillary: 167 mg/dL — ABNORMAL HIGH (ref 70–99)
Glucose-Capillary: 133 mg/dL — ABNORMAL HIGH (ref 70–99)
Glucose-Capillary: 148 mg/dL — ABNORMAL HIGH (ref 70–99)
Glucose-Capillary: 321 mg/dL — ABNORMAL HIGH (ref 70–99)

## 2022-07-19 MED ORDER — VALACYCLOVIR HCL 500 MG PO TABS
500.0000 mg | ORAL_TABLET | ORAL | Status: AC
  Administered 2022-07-19: 500 mg via ORAL
  Filled 2022-07-19: qty 1

## 2022-07-19 MED ORDER — VALACYCLOVIR HCL 500 MG PO TABS
500.0000 mg | ORAL_TABLET | ORAL | Status: DC
  Administered 2022-07-20 – 2022-07-28 (×9): 500 mg via ORAL
  Filled 2022-07-19 (×9): qty 1

## 2022-07-19 NOTE — Progress Notes (Signed)
Advised by CSW that attending is planning possible d/c tomorrow. Pt due for HD tomorrow. Inquired if pt could possibly d/c tomorrow morning in order for pt to go to out-pt HD at [facility] (pt has 11:35 chair time). Advised that plan will be for early d/c so pt can get to out-pt clinic for treatment. Spoke to pt's wife via phone to make sure she is aware of above plan. Wife is aware and agreeable. Wife confirms she can transport pt from hospital to clinic. Update provided to nephrologist and renal PA. Spoke to charge RN at [facility] to make clinic aware of above plan and to make clinic aware of pt's shingle diagnosis (per request of attending). Clinic to place pt on schedule for tomorrow. Will assist as needed.   Melven Sartorius Renal Navigator [phone]

## 2022-07-19 NOTE — Plan of Care (Signed)
  Problem: Education: Goal: Ability to describe self-care measures that may prevent or decrease complications (Diabetes Survival Skills Education) will improve Outcome: Progressing Goal: Individualized Educational Video(s) Outcome: Progressing   

## 2022-07-19 NOTE — Plan of Care (Signed)

## 2022-07-19 NOTE — Progress Notes (Signed)
[facility] Progress Note   Subjective:   Patient seen and examined at bedside.  New rash extending from groin around R side to buttock.  Reports pain this AM but currently well controlled.  Suspected shingles. BP drop during HD yesterday but felt ok post.  Denies CP, SOB, abdominal pain, and n/v/d.   Objective Vitals:   07/18/22 1945 07/18/22 2300 07/19/22 0326 07/19/22 0800  BP: (!) 147/65 (!) 104/52 125/64 125/67  Pulse: 78 69 62   Resp: 20 19 18 16   Temp: 98.5 F (36.9 C) 98.5 F (36.9 C) 98 F (36.7 C) 98.9 F (37.2 C)  TempSrc: Oral Oral Oral Oral  SpO2: 100% 100% 100%   Weight:       Physical Exam General:WDWN male in NAD Heart:RRR, no mrg Lungs:CTAB, nml WOB on RA Abdomen:soft, NTND Extremities:no LE edema Dialysis Access: RU AVF +b/t  Skin: blister like rash from groin extending in line around R hip to buttock  Filed Weights   07/13/22 1951 07/16/22 1355 07/18/22 1436  Weight: 72.4 kg 71 kg 68.6 kg    Intake/Output Summary (Last 24 hours) at 07/19/2022 1210 Last data filed at 07/18/2022 2200 Gross per 24 hour  Intake 120 ml  Output 99.6 ml  Net 20.4 ml    Additional Objective Labs: Basic Metabolic Panel: Recent Labs  Lab 07/18/22 0300 07/18/22 1000 07/19/22 0243  NA 137 136 140  K 4.3 3.5 3.8  CL 97* 100 99  CO2 26 25 30   GLUCOSE 372* 352* 185*  BUN 50* 51* 27*  CREATININE 7.03* 6.69* 5.13*  CALCIUM 9.3 8.6* 9.3  PHOS  --  4.8*  --    Liver Function Tests: Recent Labs  Lab 07/18/22 0300 07/18/22 1000 07/19/22 0243  AST 18  --  19  ALT 15  --  15  ALKPHOS 66  --  61  BILITOT 0.8  --  0.6  PROT 6.1*  --  6.0*  ALBUMIN 3.3* 3.0* 3.1*   Recent Labs  Lab 07/17/22 0909  LIPASE 33   CBC: Recent Labs  Lab 07/14/22 0543 07/16/22 0928 07/18/22 0300 07/18/22 1000 07/19/22 0243  WBC 5.8 5.5 5.5 5.3 6.5  NEUTROABS  --   --  3.3  --  3.3  HGB 10.4* 11.0* 10.2* 9.5* 10.3*  HCT 31.3* 32.6* 30.5* 29.2* 31.8*  MCV 87.9  85.1 86.9 89.6 88.6  PLT 280 248 232 230 248    CBG: Recent Labs  Lab 07/18/22 1103 07/18/22 1521 07/18/22 1944 07/18/22 2306 07/19/22 0748  GLUCAP 224* 116* 218* 183* 167*    Studies/Results: NM GASTRIC EMPTYING  Result Date: 07/17/2022 CLINICAL DATA:  Diabetes and end-stage renal disease. Nausea and vomiting with epigastric abdominal pain. EXAM: NUCLEAR MEDICINE GASTRIC EMPTYING SCAN TECHNIQUE: After oral ingestion of radiolabeled meal, sequential abdominal images were obtained for 4 hours. Percentage of activity emptying the stomach was calculated at 1 hour, 2 hour, 3 hour, and 4 hours. RADIOPHARMACEUTICALS:  2.0 mCi Tc-61m in standardized meal consisting of scrambled eggs and water COMPARISON:  07/17/2022 CT scan FINDINGS: Expected location of the stomach in the left upper quadrant. Ingested meal empties the stomach poorly over the course of the study. 10 emptied at 1 hr ( normal >= 10%) 10 emptied at 2 hr ( normal >= 40%) 11 emptied at 3 hr ( normal >= 70%) 11 emptied at 4 hr ( normal >= 90%) IMPRESSION: Substantially delayed gastric emptying with only 11% emptying at 4 hours. Electronically  Signed   By: Van Clines M.D.   On: 07/17/2022 17:34    Medications:   aspirin EC  81 mg Oral q morning   atorvastatin  80 mg Oral Daily   bisacodyl  10 mg Rectal Daily   calcitRIOL  0.5 mcg Oral Q T,Th,Sa-HD   clopidogrel  75 mg Oral q morning   darbepoetin (ARANESP) injection - DIALYSIS  100 mcg Intravenous Q Thu-HD   docusate sodium  200 mg Oral BID   ferric citrate  420 mg Oral TID WC   heparin  5,000 Units Subcutaneous Q8H   insulin aspart  0-9 Units Subcutaneous TID WC   insulin glargine-yfgn  15 Units Subcutaneous Daily   linaclotide  290 mcg Oral QAC breakfast   metoCLOPramide  10 mg Oral TID AC & HS   metoprolol tartrate  50 mg Oral BID   pantoprazole (PROTONIX) IV  40 mg Intravenous Q12H   polyethylene glycol  17 g Oral BID   sertraline  50 mg Oral Daily   [START ON  07/20/2022] valACYclovir  500 mg Oral Q24H    Dialysis Orders: TTS [facility] 4h  400/1.5  75kg  2/2 bath  Hep none RUA AVF - last HD 10/7, post wt 76.1 - last Hb 10.4 on 10/5 - mircera 120 ug q 4 wks, last  9/14 > due 10/12 thurs - venofer 50 mg weekly - calcitriol 0.5 ug tiw po tts   Home meds include - amlodipine 10, aspirin, atovastatin, clopidogrel, humalog insulin/ degludec, hydralazine 50 tid, hctz 25 qd, labetalol 200 bid, pantoprazole, sevelamer carb 800 tid, prns/ vits/ supps      Problem/Plan: ESRD - HD TTS. HD tomorrow per regular schedule.  To d/c in AM prior to outpatient dialysis.  Shingles - new Dx.  Reporting minimal pain.  Will need precautions w/HD. Renally dose medications.  Nausea/vomiting - new Dx gastroparesis. CT abdomen +constipation, no acute abnormalities. Per PMD. Now on reglan and antiemetics  Orthostatic hypotension volume- noted near syncopal episode with PT 07/12/2022, BP meds adjusted :amlodipine, hydralazine and labetalol d/c.  On metoprolol 50mg  BID only. Plan for gentle UF.  DKA/ DM1 - BS 1061 at admit./ continue SSI.  try to avoid saline IVF"s (LR, NS) w/ the DKA protocol. If IVF"s needed to avoid low BS, recommend just using D5W or D510. Managed by primary.  Anemia esrd -Hgb 10.3. ESA recently started here.  Aranesp 100 q. 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  Cont renvela with meals vitamin D on dialysis.  Changed to [place] d/t constipation Nutrition - renal diet w/fluid restrictions.  Dispo - ok for d/c from renal standpoint  Jen Mow, PA-C [place] 07/19/2022,12:10 PM  LOS: 8 days

## 2022-07-19 NOTE — Progress Notes (Signed)
PROGRESS NOTE Louis Peters  DZH:[id number] DOB: 10/24/1965 DOA: 07/10/2022 PCP: Bernerd Limbo, MD   Brief Narrative/Hospital Course:  56 year old male with ESRD on HD TTS, diabetes mellitus type 1, HTN, HLP presented with nausea vomiting and epigastric abdominal pain.  Patient is on long-acting insulin and 5 units with meals, reported that his blood sugars have been especially high over the last few days.  Patient reported that he developed progressively worsening nausea and vomiting in the last 24 hours, unable to hold anything down . Patient was admitted treated for DKA, transitioned to subcu insulin blood sugar stabilized on the having intermittent hypoglycemia further adjusted meds.  He had near-syncopal syncopal episode with orthostatic hypotension> BP meds being tapered down   Subjective:  Patient in bed, appears comfortable, denies any headache, no fever, no chest pain or pressure, no shortness of breath , no abdominal pain. No focal weakness.   Assessment and Plan:   Orthostatic hypotension near syncope episode  - was on multiple blood pressure medications which have been held currently only on beta-blocker, stockings added, orthostatics much improved.  Continue to monitor.     New diagnosis of diabetic gastroparesis causing nausea vomiting: Chronic intermittent persistent nausea and nausea vomiting, work-up here suggest gastroparesis on 07/17/2022, placed on Premeal Reglan along with supportive care for nausea with much improvement advance diet, if stable discharge in the next 1 to 2 days.  Patient feels a whole lot better.  ESRD on HD TTS, seen by nephrology currently on MWF schedule.  Anemia of ESRD ESA started recently hemoglobin is stable-much intermittently  Metabolic bone disease from CKD calcium and phosphate elevated continue PO VRDA and Renvela per nephrology  DM2 with DKA - DKA resolved, currently on Lantus and sliding scale monitor and adjust, diabetic and insulin  education, insulin dose adjusted on 07/18/2022 for better control.  Shingles -Started on Valtrex, renally dosed.   Lab Results  Component Value Date   HGBA1C 11.2 (H) 07/11/2022   CBG (last 3)  Recent Labs    07/18/22 2306 07/19/22 0748 07/19/22 1258  GLUCAP 183* 167* 133*     DVT prophylaxis: Place TED hose Start: 07/16/22 0607 heparin injection 5,000 Units Start: 07/11/22 1400 SCDs Start: 07/11/22 0425 Code Status:   Code Status: Full Code Family Communication: plan of care discussed with patient/wife on phone  Patient status is: Inpatient because of orthostatic hypotension symptomatic Level of care: Telemetry Medical   Dispo: The patient is from: home.  Current activity level 3> stand with assist balance while standing and cannot march in place  Anticipated disposition: home  Objective: Vitals last 24 hrs: Vitals:   07/18/22 2300 07/19/22 0326 07/19/22 0800 07/19/22 1259  BP: (!) 104/52 125/64 125/67 129/69  Pulse: 69 62    Resp: 19 18 16 18   Temp: 98.5 F (36.9 C) 98 F (36.7 C) 98.9 F (37.2 C) 99.3 F (37.4 C)  TempSrc: Oral Oral Oral   SpO2: 100% 100% 100% 100%  Weight:       Weight change:   Physical Examination:  Awake Alert, Oriented X 3, No new F.N deficits, Normal affect Symmetrical Chest wall movement, Good air movement bilaterally, CTAB RRR,No Gallops,Rubs or new Murmurs, No Parasternal Heave +ve B.Sounds, Abd Soft, No tenderness, No rebound - guarding or rigidity. No Cyanosis, Clubbing or edema, blister rash from right groin extending around the right hip to buttocks, does not cross midline.  See pictures below.       Medications reviewed:  Scheduled  Meds:  aspirin EC  81 mg Oral q morning   atorvastatin  80 mg Oral Daily   bisacodyl  10 mg Rectal Daily   calcitRIOL  0.5 mcg Oral Q T,Th,Sa-HD   clopidogrel  75 mg Oral q morning   darbepoetin (ARANESP) injection - DIALYSIS  100 mcg Intravenous Q Thu-HD   docusate sodium  200 mg Oral  BID   ferric citrate  420 mg Oral TID WC   heparin  5,000 Units Subcutaneous Q8H   insulin aspart  0-9 Units Subcutaneous TID WC   insulin glargine-yfgn  15 Units Subcutaneous Daily   linaclotide  290 mcg Oral QAC breakfast   metoCLOPramide  10 mg Oral TID AC & HS   metoprolol tartrate  50 mg Oral BID   pantoprazole (PROTONIX) IV  40 mg Intravenous Q12H   polyethylene glycol  17 g Oral BID   sertraline  50 mg Oral Daily   [START ON 07/20/2022] valACYclovir  500 mg Oral Q24H   Continuous Infusions:    Diet Order             Diet Carb Modified Fluid consistency: Thin; Room service appropriate? Yes; Fluid restriction: 1500 mL Fluid  Diet effective now           Diet Carb Modified                  Labs  Recent Labs  Lab 07/14/22 0543 07/16/22 0928 07/18/22 0300 07/18/22 1000 07/19/22 0243  WBC 5.8 5.5 5.5 5.3 6.5  HGB 10.4* 11.0* 10.2* 9.5* 10.3*  HCT 31.3* 32.6* 30.5* 29.2* 31.8*  PLT 280 248 232 230 248  MCV 87.9 85.1 86.9 89.6 88.6  MCH 29.2 28.7 29.1 29.1 28.7  MCHC 33.2 33.7 33.4 32.5 32.4  RDW 15.9* 15.6* 15.8* 15.8* 15.7*  LYMPHSABS  --   --  1.3  --  2.4  MONOABS  --   --  0.8  --  0.7  EOSABS  --   --  0.0  --  0.0  BASOSABS  --   --  0.0  --  0.0    Recent Labs  Lab 07/14/22 0543 07/16/22 0928 07/18/22 0300 07/18/22 1000 07/19/22 0243  NA 137 133* 137 136 140  K 3.9 4.6 4.3 3.5 3.8  CL 96* 92* 97* 100 99  CO2 31 26 26 25 30   GLUCOSE 202* 353* 372* 352* 185*  BUN 30* 58* 50* 51* 27*  CREATININE 5.95* 8.52* 7.03* 6.69* 5.13*  CALCIUM 8.9 9.2 9.3 8.6* 9.3  AST  --   --  18  --  19  ALT  --   --  15  --  15  ALKPHOS  --   --  66  --  61  BILITOT  --   --  0.8  --  0.6  ALBUMIN  --   --  3.3* 3.0* 3.1*  PHOS  --   --   --  4.8*  --   CRP  --   --  0.5  --  <0.5    Other culture-see note  Radiology Studies: NM GASTRIC EMPTYING  Result Date: 07/17/2022 CLINICAL DATA:  Diabetes and end-stage renal disease. Nausea and vomiting with  epigastric abdominal pain. EXAM: NUCLEAR MEDICINE GASTRIC EMPTYING SCAN TECHNIQUE: After oral ingestion of radiolabeled meal, sequential abdominal images were obtained for 4 hours. Percentage of activity emptying the stomach was calculated at 1 hour, 2 hour, 3 hour, and 4 hours.  RADIOPHARMACEUTICALS:  2.0 mCi Tc-72m in standardized meal consisting of scrambled eggs and water COMPARISON:  07/17/2022 CT scan FINDINGS: Expected location of the stomach in the left upper quadrant. Ingested meal empties the stomach poorly over the course of the study. 10 emptied at 1 hr ( normal >= 10%) 10 emptied at 2 hr ( normal >= 40%) 11 emptied at 3 hr ( normal >= 70%) 11 emptied at 4 hr ( normal >= 90%) IMPRESSION: Substantially delayed gastric emptying with only 11% emptying at 4 hours. Electronically Signed   By: Van Clines M.D.   On: 07/17/2022 17:34    Signature  Phillips Climes M.D on 07/19/2022 at 1:57 PM   -  To page go to [email or web address]

## 2022-07-19 NOTE — Progress Notes (Signed)
   07/19/22 1500  Orthostatic Lying   BP- Lying 141/74  Pulse- Lying 67  Orthostatic Sitting  BP- Sitting 102/64  Pulse- Sitting 68  Orthostatic Standing at 0 minutes  BP- Standing at 0 minutes (!) 71/52  Pulse- Standing at 0 minutes 87     Clatie Kessen S, PT DPT Acute Rehabilitation Services Pager [phone]  Office [phone]

## 2022-07-19 NOTE — Progress Notes (Signed)
Received call from tele.  Advised that pt had a 18 beat run of SVT in th 160's.  Checked on pt and he advised was just lying there and had not gotten up or anything.  Pt advised did not have any changes in feeling in chest.  I sent secure chat to physician.

## 2022-07-19 NOTE — Progress Notes (Signed)
Physical Therapy Treatment Patient Details Name: Louis Peters MRN: [id number] DOB: Dec 02, 1965 Today's Date: 07/19/2022   History of Present Illness Pt is a 56 y/o male admitted secondary to hyperglycemia. Shingles on R hip. PMH includes ESRD on HD TTS and T1DM.    PT Comments    Pt optimistic and motivated to participate in PT, PT focus on mobility progression pending BP response to activity. Pt tolerated standing x30 seconds before significant symptomatic orthostatic hypotension to 71/52, ted hose donned and time taken at each transition. Pt quickly returned to supine with PT cues, after 2 minutes PT assisted pt to recliner and elevated legs/reclined backrest immediately, BP 130s/70s after transfer and pt feeling well. RN notified that pt is in recliner with legs elevated, MD also aware. PT unable to progress pt to gait training this session given orthostatic hypotension. PT to continue to progress mobility as able.     Recommendations for follow up therapy are one component of a multi-disciplinary discharge planning process, led by the attending physician.  Recommendations may be updated based on patient status, additional functional criteria and insurance authorization.  Follow Up Recommendations  No PT follow up     Assistance Recommended at Discharge Intermittent Supervision/Assistance  Patient can return home with the following A little help with walking and/or transfers;A little help with bathing/dressing/bathroom;Help with stairs or ramp for entrance;Assist for transportation;Assistance with cooking/housework   Equipment Recommendations  None recommended by PT    Recommendations for Other Services       Precautions / Restrictions Precautions Precautions: Fall Precaution Comments: watch BP Restrictions Weight Bearing Restrictions: No     Mobility  Bed Mobility Overal bed mobility: Modified Independent                  Transfers Overall transfer level:  Modified independent                 General transfer comment: mod I for increased time, pt tolerated standing x30 seconds before significant symptomatic orthostatic hypotension started (BP 70s/50s). Pt quickly returned to supine, after 2 minutes PT assisted pt to recliner and elevated legs reclined backrest immediately, BP 130s/70s after transfer and pt feeling well    Ambulation/Gait         Gait velocity: decr     General Gait Details: NT given symptomatic orthostatic hypotension   Stairs             Wheelchair Mobility    Modified [name] (Stroke Patients Only)       Balance Overall balance assessment: Needs assistance   Sitting balance-Leahy Scale: Good       Standing balance-Leahy Scale: Good Standing balance comment: limited by orthostatic hypotension only                            Cognition Arousal/Alertness: Awake/alert Behavior During Therapy: WFL for tasks assessed/performed Overall Cognitive Status: Within Functional Limits for tasks assessed                                          Exercises      General Comments        Pertinent Vitals/Pain Pain Assessment Pain Assessment: Faces Faces Pain Scale: No hurt    Home Living  Prior Function            PT Goals (current goals can now be found in the care plan section) Acute Rehab PT Goals Patient Stated Goal: to go home PT Goal Formulation: With patient Time For Goal Achievement: 07/26/22 Potential to Achieve Goals: Good Progress towards PT goals: Progressing toward goals    Frequency    Min 3X/week      PT Plan Current plan remains appropriate    Co-evaluation              AM-PAC PT "6 Clicks" Mobility   Outcome Measure  Help needed turning from your back to your side while in a flat bed without using bedrails?: None Help needed moving from lying on your back to sitting on the side of a flat  bed without using bedrails?: None Help needed moving to and from a bed to a chair (including a wheelchair)?: None Help needed standing up from a chair using your arms (e.g., wheelchair or bedside chair)?: None Help needed to walk in hospital room?: A Lot Help needed climbing 3-5 steps with a railing? : A Lot 6 Click Score: 20    End of Session   Activity Tolerance: Treatment limited secondary to medical complications (Comment) (orthostatic hypotension) Patient left: in chair;with call bell/phone within reach;Other (comment) Nurse Communication: Mobility status;Other (comment) (RN to review no getting up by himself, PT exit when MD came to room) PT Visit Diagnosis: Unsteadiness on feet (R26.81);Muscle weakness (generalized) (M62.81)     Time: 2836-6294 PT Time Calculation (min) (ACUTE ONLY): 16 min  Charges:  $Therapeutic Activity: 8-22 mins                     Stacie Glaze, PT DPT Acute Rehabilitation Services Pager [phone]  Office [phone]    [place] E Ruffin Pyo 07/19/2022, 5:01 PM

## 2022-07-19 NOTE — Progress Notes (Signed)
Late note:  While doing assessment, pt complained of burning where "his blisters are". Pt with line of blisters from right buttock, around front to groin area. Mepilex removed and dry gauze placed over top with paper tape. Pt stated blisters started after he began receiving "the baths with the wipes". Will hold off on CHG bath, and will relay to day team.

## 2022-07-20 DIAGNOSIS — I951 Orthostatic hypotension: Secondary | ICD-10-CM

## 2022-07-20 DIAGNOSIS — N186 End stage renal disease: Secondary | ICD-10-CM | POA: Diagnosis not present

## 2022-07-20 DIAGNOSIS — R739 Hyperglycemia, unspecified: Secondary | ICD-10-CM | POA: Diagnosis not present

## 2022-07-20 DIAGNOSIS — B029 Zoster without complications: Secondary | ICD-10-CM | POA: Diagnosis not present

## 2022-07-20 LAB — CBC WITH DIFFERENTIAL/PLATELET
Abs Immature Granulocytes: 0 10*3/uL (ref 0.00–0.07)
Abs Immature Granulocytes: 0.01 10*3/uL (ref 0.00–0.07)
Basophils Absolute: 0.1 10*3/uL (ref 0.0–0.1)
Basophils Absolute: 0 10*3/uL (ref 0.0–0.1)
Basophils Relative: 2 %
Basophils Relative: 1 %
Eosinophils Absolute: 0 10*3/uL (ref 0.0–0.5)
Eosinophils Absolute: 0.1 10*3/uL (ref 0.0–0.5)
Eosinophils Relative: 0 %
Eosinophils Relative: 1 %
HCT: 29.6 % — ABNORMAL LOW (ref 39.0–52.0)
HCT: 29.5 % — ABNORMAL LOW (ref 39.0–52.0)
Hemoglobin: 9.8 g/dL — ABNORMAL LOW (ref 13.0–17.0)
Hemoglobin: 10 g/dL — ABNORMAL LOW (ref 13.0–17.0)
Immature Granulocytes: 0 %
Lymphocytes Relative: 34 %
Lymphocytes Relative: 35 %
Lymphs Abs: 2.5 10*3/uL (ref 0.7–4.0)
Lymphs Abs: 2.3 10*3/uL (ref 0.7–4.0)
MCH: 29.3 pg (ref 26.0–34.0)
MCH: 29.6 pg (ref 26.0–34.0)
MCHC: 33.1 g/dL (ref 30.0–36.0)
MCHC: 33.9 g/dL (ref 30.0–36.0)
MCV: 88.4 fL (ref 80.0–100.0)
MCV: 87.3 fL (ref 80.0–100.0)
Monocytes Absolute: 0.5 10*3/uL (ref 0.1–1.0)
Monocytes Absolute: 0.5 10*3/uL (ref 0.1–1.0)
Monocytes Relative: 7 %
Monocytes Relative: 7 %
Neutro Abs: 4.2 10*3/uL (ref 1.7–7.7)
Neutro Abs: 3.7 10*3/uL (ref 1.7–7.7)
Neutrophils Relative %: 57 %
Neutrophils Relative %: 56 %
Platelets: 248 10*3/uL (ref 150–400)
Platelets: 255 10*3/uL (ref 150–400)
RBC: 3.35 MIL/uL — ABNORMAL LOW (ref 4.22–5.81)
RBC: 3.38 MIL/uL — ABNORMAL LOW (ref 4.22–5.81)
RDW: 15.7 % — ABNORMAL HIGH (ref 11.5–15.5)
RDW: 15.4 % (ref 11.5–15.5)
WBC: 7.4 10*3/uL (ref 4.0–10.5)
WBC: 6.6 10*3/uL (ref 4.0–10.5)
nRBC: 0 % (ref 0.0–0.2)
nRBC: 0 /100 WBC
nRBC: 0 % (ref 0.0–0.2)

## 2022-07-20 LAB — COMPREHENSIVE METABOLIC PANEL
ALT: 17 U/L (ref 0–44)
AST: 21 U/L (ref 15–41)
Albumin: 3.1 g/dL — ABNORMAL LOW (ref 3.5–5.0)
Alkaline Phosphatase: 60 U/L (ref 38–126)
Anion gap: 10 (ref 5–15)
BUN: 40 mg/dL — ABNORMAL HIGH (ref 6–20)
CO2: 30 mmol/L (ref 22–32)
Calcium: 9.2 mg/dL (ref 8.9–10.3)
Chloride: 98 mmol/L (ref 98–111)
Creatinine, Ser: 6.83 mg/dL — ABNORMAL HIGH (ref 0.61–1.24)
GFR, Estimated: 9 mL/min — ABNORMAL LOW (ref >60)
Glucose, Bld: 222 mg/dL — ABNORMAL HIGH (ref 70–99)
Potassium: 4.1 mmol/L (ref 3.5–5.1)
Sodium: 138 mmol/L (ref 135–145)
Total Bilirubin: 0.5 mg/dL (ref 0.3–1.2)
Total Protein: 5.8 g/dL — ABNORMAL LOW (ref 6.5–8.1)

## 2022-07-20 LAB — GLUCOSE, CAPILLARY
Glucose-Capillary: 204 mg/dL — ABNORMAL HIGH (ref 70–99)
Glucose-Capillary: 126 mg/dL — ABNORMAL HIGH (ref 70–99)
Glucose-Capillary: 153 mg/dL — ABNORMAL HIGH (ref 70–99)
Glucose-Capillary: 97 mg/dL (ref 70–99)

## 2022-07-20 LAB — CBC
HCT: 29.5 % — ABNORMAL LOW (ref 39.0–52.0)
Hemoglobin: 10 g/dL — ABNORMAL LOW (ref 13.0–17.0)
MCH: 29.4 pg (ref 26.0–34.0)
MCHC: 33.9 g/dL (ref 30.0–36.0)
MCV: 86.8 fL (ref 80.0–100.0)
Platelets: 259 10*3/uL (ref 150–400)
RBC: 3.4 MIL/uL — ABNORMAL LOW (ref 4.22–5.81)
RDW: 15.3 % (ref 11.5–15.5)
WBC: 7.2 10*3/uL (ref 4.0–10.5)
nRBC: 0 % (ref 0.0–0.2)

## 2022-07-20 LAB — C-REACTIVE PROTEIN: CRP: <0.5 mg/dL (ref <1.0)

## 2022-07-20 MED ORDER — ANTICOAGULANT SODIUM CITRATE 4% (200MG/5ML) IV SOLN: 5.0000 mL | Status: DC | PRN

## 2022-07-20 MED ORDER — CHLORHEXIDINE GLUCONATE CLOTH 2 % EX PADS: 6.0000 | MEDICATED_PAD | Freq: Every day | CUTANEOUS | Status: DC

## 2022-07-20 MED ORDER — ALTEPLASE 2 MG IJ SOLR: 2.0000 mg | Freq: Once | INTRAMUSCULAR | Status: DC | PRN

## 2022-07-20 MED ORDER — LIDOCAINE HCL (PF) 1 % IJ SOLN: 5.0000 mL | INTRAMUSCULAR | Status: DC | PRN

## 2022-07-20 MED ORDER — PENTAFLUOROPROP-TETRAFLUOROETH EX AERO: 1.0000 | INHALATION_SPRAY | CUTANEOUS | Status: DC | PRN

## 2022-07-20 MED ORDER — HEPARIN SODIUM (PORCINE) 1000 UNIT/ML DIALYSIS: 1000.0000 [IU] | INTRAMUSCULAR | Status: DC | PRN

## 2022-07-20 MED ORDER — LIDOCAINE-PRILOCAINE 2.5-2.5 % EX CREA: 1.0000 | TOPICAL_CREAM | CUTANEOUS | Status: DC | PRN

## 2022-07-20 MED ORDER — AMLODIPINE BESYLATE 5 MG PO TABS: 5.0000 mg | ORAL_TABLET | Freq: Every day | ORAL | Status: DC

## 2022-07-20 MED ORDER — SODIUM CHLORIDE 0.9 % IV SOLN: INTRAVENOUS | Status: DC

## 2022-07-20 NOTE — Plan of Care (Signed)
  Problem: Education: Goal: Ability to describe self-care measures that may prevent or decrease complications (Diabetes Survival Skills Education) will improve Outcome: Progressing Goal: Individualized Educational Video(s) Outcome: Progressing   

## 2022-07-20 NOTE — Progress Notes (Signed)
PROGRESS NOTE Louis CHALFIN  PPI:[id number] DOB: October 09, 1965 DOA: 07/10/2022 PCP: Bernerd Limbo, MD   Brief Narrative/Hospital Course:  56 year old male with ESRD on HD TTS, diabetes mellitus type 1, HTN, HLP presented with nausea vomiting and epigastric abdominal pain.  Patient is on long-acting insulin and 5 units with meals, reported that his blood sugars have been especially high over the last few days.  Patient reported that he developed progressively worsening nausea and vomiting Peters the last 24 hours, unable to hold anything down . Patient was admitted treated for DKA, transitioned to subcu insulin blood sugar stabilized on the having intermittent hypoglycemia further adjusted meds.  He had near-syncopal syncopal episode with orthostatic hypotension> BP meds being tapered down   Subjective:   Patient denies any complaints, but he was significantly orthostatic this morning where he was dizzy, lightheaded, could not stay standing up to finish his readings.    Assessment and Plan:   Orthostatic hypotension near syncope episode  - was on multiple blood pressure medications which have been held currently only on beta-blocker. -Patient remains with significant orthostasis, most likely due to underlying diabetic neuropathy . -Did add TED hose, despite that he remains orthostatic, so it was changed to thigh-high TED hose, and will add abdominal binder, cannot add any midodrine or Florinef Peters the setting of elevated blood pressure upon laying supine.   -Further volume management with HD, EDW has been adjusted, and fluid restriction has been removed.  New diagnosis of diabetic gastroparesis causing nausea vomiting: Chronic intermittent persistent nausea and nausea vomiting, work-up here suggest gastroparesis on 07/17/2022, placed on Premeal Reglan along with supportive care for nausea with much improvement advance diet, if stable discharge Peters the next 1 to 2 days.  Patient feels a whole lot  better.  ESRD on HD TTS, seen by nephrology currently on MWF schedule.  Anemia of ESRD ESA started recently hemoglobin is stable-much intermittently  Metabolic bone disease from CKD calcium and phosphate elevated continue PO VRDA and Renvela per nephrology  DM2 with DKA - DKA resolved, currently on Lantus and sliding scale monitor and adjust, diabetic and insulin education, insulin dose adjusted on 07/18/2022 for better control.  Shingles -Started on Valtrex, renally dosed.   Lab Results  Component Value Date   HGBA1C 11.2 (H) 07/11/2022   CBG (last 3)  Recent Labs    07/19/22 2156 07/20/22 0759 07/20/22 1227  GLUCAP 321* 204* 126*     DVT prophylaxis: Place TED hose Start: 07/16/22 0607 heparin injection 5,000 Units Start: 07/11/22 1400 SCDs Start: 07/11/22 0425 Code Status:   Code Status: Full Code Family Communication: plan of care discussed with wife on phone 10/18  Patient status is: Inpatient because of orthostatic hypotension symptomatic Level of care: Telemetry Medical   Dispo: The patient is from: home.  Current activity level 3> stand with assist balance while standing and cannot march Peters place  Anticipated disposition: home  Objective: Vitals last 24 hrs: Vitals:   07/19/22 1953 07/19/22 2300 07/20/22 0754 07/20/22 1200  BP: 137/62 (!) 141/81 (!) 168/79 127/69  Pulse: 69 60  (!) 59  Resp: 16 12 15 14   Temp: 98.9 F (37.2 C) 98.9 F (37.2 C) 98.7 F (37.1 C) 98.1 F (36.7 C)  TempSrc: Oral Oral Oral Oral  SpO2: 95%     Weight:       Weight change:   Physical Examination:  Awake Alert, Oriented X 3, No new F.N deficits, Normal affect Symmetrical Chest wall movement,  Good air movement bilaterally, CTAB RRR,No Gallops,Rubs or new Murmurs, No Parasternal Heave +ve B.Sounds, Abd Soft, No tenderness, No rebound - guarding or rigidity. No Cyanosis, Clubbing, blisters right lower abdomen with no significant change.      Medications reviewed:   Scheduled Meds:  aspirin EC  81 mg Oral q morning   atorvastatin  80 mg Oral Daily   bisacodyl  10 mg Rectal Daily   calcitRIOL  0.5 mcg Oral Q T,Th,Sa-HD   Chlorhexidine Gluconate Cloth  6 each Topical Q0600   clopidogrel  75 mg Oral q morning   darbepoetin (ARANESP) injection - DIALYSIS  100 mcg Intravenous Q Thu-HD   docusate sodium  200 mg Oral BID   ferric citrate  420 mg Oral TID WC   heparin  5,000 Units Subcutaneous Q8H   insulin aspart  0-9 Units Subcutaneous TID WC   insulin glargine-yfgn  15 Units Subcutaneous Daily   metoCLOPramide  10 mg Oral TID AC & HS   metoprolol tartrate  50 mg Oral BID   pantoprazole (PROTONIX) IV  40 mg Intravenous Q12H   polyethylene glycol  17 g Oral BID   sertraline  50 mg Oral Daily   valACYclovir  500 mg Oral Q24H   Continuous Infusions:    Diet Order             Diet Carb Modified Fluid consistency: Thin; Room service appropriate? Yes  Diet effective now           Diet Carb Modified                  Labs  Recent Labs  Lab 07/16/22 0928 07/18/22 0300 07/18/22 1000 07/19/22 0243 07/20/22 0231  WBC 5.5 5.5 5.3 6.5 7.4  HGB 11.0* 10.2* 9.5* 10.3* 9.8*  HCT 32.6* 30.5* 29.2* 31.8* 29.6*  PLT 248 232 230 248 248  MCV 85.1 86.9 89.6 88.6 88.4  MCH 28.7 29.1 29.1 28.7 29.3  MCHC 33.7 33.4 32.5 32.4 33.1  RDW 15.6* 15.8* 15.8* 15.7* 15.7*  LYMPHSABS  --  1.3  --  2.4 2.5  MONOABS  --  0.8  --  0.7 0.5  EOSABS  --  0.0  --  0.0 0.0  BASOSABS  --  0.0  --  0.0 0.1    Recent Labs  Lab 07/16/22 0928 07/18/22 0300 07/18/22 1000 07/19/22 0243 07/20/22 0231  NA 133* 137 136 140 138  K 4.6 4.3 3.5 3.8 4.1  CL 92* 97* 100 99 98  CO2 26 26 25 30 30   GLUCOSE 353* 372* 352* 185* 222*  BUN 58* 50* 51* 27* 40*  CREATININE 8.52* 7.03* 6.69* 5.13* 6.83*  CALCIUM 9.2 9.3 8.6* 9.3 9.2  AST  --  18  --  19 21  ALT  --  15  --  15 17  ALKPHOS  --  66  --  61 60  BILITOT  --  0.8  --  0.6 0.5  ALBUMIN  --  3.3* 3.0* 3.1*  3.1*  PHOS  --   --  4.8*  --   --   CRP  --  0.5  --  <0.5 <0.5    Other culture-see note  Radiology Studies: No results found.  Signature  Louis Peters M.D on 07/20/2022 at 1:56 PM   -  To page go to [email or web address]

## 2022-07-20 NOTE — Plan of Care (Signed)

## 2022-07-20 NOTE — Progress Notes (Addendum)
[facility] Progress Note   Subjective:   Patient seen and examined at bedside.  Dizziness and lightheadedness when standing with orthostatic hypotension this AM.  Denies CP, SOB, abdominal pain, pain from shingles and n/v/d. In good spirits.   Objective Vitals:   07/19/22 1953 07/19/22 2300 07/20/22 0754 07/20/22 1200  BP: 137/62 (!) 141/81 (!) 168/79 127/69  Pulse: 69 60  (!) 59  Resp: 16 12 15 14   Temp: 98.9 F (37.2 C) 98.9 F (37.2 C) 98.7 F (37.1 C) 98.1 F (36.7 C)  TempSrc: Oral Oral Oral Oral  SpO2: 95%     Weight:       Physical Exam General:WDWN male in NAD Heart:RRR, no mrg Lungs:CTAB, nml WOB on RA Abdomen:soft, NTND Extremities:no LE edema Dialysis Access: RU AVF +b/t   Filed Weights   07/13/22 1951 07/16/22 1355 07/18/22 1436  Weight: 72.4 kg 71 kg 68.6 kg    Intake/Output Summary (Last 24 hours) at 07/20/2022 1342 Last data filed at 07/20/2022 1229 Gross per 24 hour  Intake --  Output 225 ml  Net -225 ml    Additional Objective Labs: Basic Metabolic Panel: Recent Labs  Lab 07/18/22 1000 07/19/22 0243 07/20/22 0231  NA 136 140 138  K 3.5 3.8 4.1  CL 100 99 98  CO2 25 30 30   GLUCOSE 352* 185* 222*  BUN 51* 27* 40*  CREATININE 6.69* 5.13* 6.83*  CALCIUM 8.6* 9.3 9.2  PHOS 4.8*  --   --    Liver Function Tests: Recent Labs  Lab 07/18/22 0300 07/18/22 1000 07/19/22 0243 07/20/22 0231  AST 18  --  19 21  ALT 15  --  15 17  ALKPHOS 66  --  61 60  BILITOT 0.8  --  0.6 0.5  PROT 6.1*  --  6.0* 5.8*  ALBUMIN 3.3* 3.0* 3.1* 3.1*   Recent Labs  Lab 07/17/22 0909  LIPASE 33   CBC: Recent Labs  Lab 07/16/22 0928 07/18/22 0300 07/18/22 1000 07/19/22 0243 07/20/22 0231  WBC 5.5 5.5 5.3 6.5 7.4  NEUTROABS  --  3.3  --  3.3 4.2  HGB 11.0* 10.2* 9.5* 10.3* 9.8*  HCT 32.6* 30.5* 29.2* 31.8* 29.6*  MCV 85.1 86.9 89.6 88.6 88.4  PLT 248 232 230 248 248    Medications:   aspirin EC  81 mg Oral q morning    atorvastatin  80 mg Oral Daily   bisacodyl  10 mg Rectal Daily   calcitRIOL  0.5 mcg Oral Q T,Th,Sa-HD   Chlorhexidine Gluconate Cloth  6 each Topical Q0600   clopidogrel  75 mg Oral q morning   darbepoetin (ARANESP) injection - DIALYSIS  100 mcg Intravenous Q Thu-HD   docusate sodium  200 mg Oral BID   ferric citrate  420 mg Oral TID WC   heparin  5,000 Units Subcutaneous Q8H   insulin aspart  0-9 Units Subcutaneous TID WC   insulin glargine-yfgn  15 Units Subcutaneous Daily   metoCLOPramide  10 mg Oral TID AC & HS   metoprolol tartrate  50 mg Oral BID   pantoprazole (PROTONIX) IV  40 mg Intravenous Q12H   polyethylene glycol  17 g Oral BID   sertraline  50 mg Oral Daily   valACYclovir  500 mg Oral Q24H    Dialysis Orders: TTS [facility] 4h  400/1.5  75kg  2/2 bath  Hep none RUA AVF - last HD 10/7, post wt 76.1 - last Hb 10.4 on  10/5 - mircera 120 ug q 4 wks, last  9/14 > due 10/12 thurs - venofer 50 mg weekly - calcitriol 0.5 ug tiw po tts   Home meds include - amlodipine 10, aspirin, atovastatin, clopidogrel, humalog insulin/ degludec, hydralazine 50 tid, hctz 25 qd, labetalol 200 bid, pantoprazole, sevelamer carb 800 tid, prns/ vits/ supps      Problem/Plan: ESRD - HD TTS. HD today per regular schedule.  Plan to give 1L fluid with HD. Shingles - new Dx.  Reporting minimal pain.  Will need precautions w/HD. Renally dosed medications.  Nausea/vomiting - new Dx gastroparesis. CT abdomen +constipation, no acute abnormalities. Per PMD. Now on reglan and antiemetics. Feeling better today. Orthostatic hypotension/ volume- noted near syncopal episode with PT 07/12/2022, BP meds adjusted :amlodipine, hydralazine and labetalol d/c.  On metoprolol 50mg  BID only. Continues to have near syncopal episodes.  Plan to give 1L fluid with HD today to help increase intravascular volume. Significantly under edw at this point, likely needs to increase back to current EDW or may need to be raised.  Removed  fluid restrictions from diet.  DKA/ DM1 - BS 1061 at admit./ continue SSI.  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 ESA recently started here.  Aranesp 100 q. 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  Cont renvela with meals vitamin D on dialysis.  Changed to [place] d/t constipation Nutrition - carb modified diet w/o fluid restrictions. Alb 3.1 - give protein supplements.  Monitor K, if becomes elevated will need to change to renal/carb modified diet.    Jen Mow, PA-C [facility] 07/20/2022,1:42 PM  LOS: 9 days

## 2022-07-20 NOTE — Progress Notes (Signed)
This rn called Dr Joelyn Oms and informed pt bps 170s/100s, pt receiving 1 liter NS infusion as ordered during dialysis tx. Will discontinue liter NS and continue to run with uf removal mode off

## 2022-07-21 DIAGNOSIS — N185 Chronic kidney disease, stage 5: Secondary | ICD-10-CM

## 2022-07-21 DIAGNOSIS — R739 Hyperglycemia, unspecified: Secondary | ICD-10-CM | POA: Diagnosis not present

## 2022-07-21 DIAGNOSIS — N186 End stage renal disease: Secondary | ICD-10-CM | POA: Diagnosis not present

## 2022-07-21 DIAGNOSIS — I951 Orthostatic hypotension: Secondary | ICD-10-CM | POA: Diagnosis not present

## 2022-07-21 DIAGNOSIS — I120 Hypertensive chronic kidney disease with stage 5 chronic kidney disease or end stage renal disease: Secondary | ICD-10-CM

## 2022-07-21 DIAGNOSIS — E1022 Type 1 diabetes mellitus with diabetic chronic kidney disease: Secondary | ICD-10-CM

## 2022-07-21 LAB — C-REACTIVE PROTEIN: CRP: <0.5 mg/dL (ref <1.0)

## 2022-07-21 LAB — COMPREHENSIVE METABOLIC PANEL
ALT: 15 U/L (ref 0–44)
AST: 21 U/L (ref 15–41)
Albumin: 3.3 g/dL — ABNORMAL LOW (ref 3.5–5.0)
Alkaline Phosphatase: 62 U/L (ref 38–126)
Anion gap: 10 (ref 5–15)
BUN: 36 mg/dL — ABNORMAL HIGH (ref 6–20)
CO2: 29 mmol/L (ref 22–32)
Calcium: 9.1 mg/dL (ref 8.9–10.3)
Chloride: 99 mmol/L (ref 98–111)
Creatinine, Ser: 5.83 mg/dL — ABNORMAL HIGH (ref 0.61–1.24)
GFR, Estimated: 11 mL/min — ABNORMAL LOW (ref >60)
Glucose, Bld: 99 mg/dL (ref 70–99)
Potassium: 4.1 mmol/L (ref 3.5–5.1)
Sodium: 138 mmol/L (ref 135–145)
Total Bilirubin: 0.7 mg/dL (ref 0.3–1.2)
Total Protein: 6.1 g/dL — ABNORMAL LOW (ref 6.5–8.1)

## 2022-07-21 LAB — GLUCOSE, CAPILLARY
Glucose-Capillary: 127 mg/dL — ABNORMAL HIGH (ref 70–99)
Glucose-Capillary: 120 mg/dL — ABNORMAL HIGH (ref 70–99)
Glucose-Capillary: 240 mg/dL — ABNORMAL HIGH (ref 70–99)
Glucose-Capillary: 264 mg/dL — ABNORMAL HIGH (ref 70–99)

## 2022-07-21 LAB — PHOSPHORUS: Phosphorus: 2.5 mg/dL (ref 2.5–4.6)

## 2022-07-21 MED ORDER — SODIUM CHLORIDE 0.9 % IV SOLN: INTRAVENOUS | Status: AC

## 2022-07-21 MED ORDER — METOPROLOL TARTRATE 25 MG PO TABS
25.0000 mg | ORAL_TABLET | Freq: Two times a day (BID) | ORAL | Status: DC
  Administered 2022-07-21 – 2022-07-23 (×3): 25 mg via ORAL
  Filled 2022-07-21 (×4): qty 1

## 2022-07-21 MED ORDER — CHLORHEXIDINE GLUCONATE CLOTH 2 % EX PADS: 6.0000 | MEDICATED_PAD | Freq: Every day | CUTANEOUS | Status: DC

## 2022-07-21 NOTE — Progress Notes (Signed)
   07/20/22 2120  Vitals  Temp 98.1 F (36.7 C)  BP (!) 187/79  Pulse Rate 64  ECG Heart Rate 64  Resp 15  Post Treatment  Dialyzer Clearance Lightly streaked  Duration of HD Treatment -hour(s) 3.5 hour(s)  Liters Processed 83.9  Fluid Removed 0 mL  Tolerated HD Treatment Yes  Post-Hemodialysis Comments Did have a bp that was low thenhigh, stoopped bolus.  AVG/AVF Arterial Site Held (minutes) 10 minutes  AVG/AVF Venous Site Held (minutes) 10 minutes   Had low then higher bp / bolus stppped, TX fin. W/o difficulty.

## 2022-07-21 NOTE — Progress Notes (Signed)
   07/21/22 1059 07/21/22 1102 07/21/22 1105  Orthostatic Lying   BP- Lying 154/79  --   --   Pulse- Lying 66  --   --   Orthostatic Sitting  BP- Sitting  --  139/86  --   Pulse- Sitting  --  76  --   Orthostatic Standing at 0 minutes  BP- Standing at 0 minutes  --   --  (!) 130/110  Pulse- Standing at 0 minutes  --   --  72

## 2022-07-21 NOTE — Progress Notes (Addendum)
[facility] Progress Note   Subjective: Patient seen and examined at bedside.  Feeling pretty good.  Tolerated dialysis well.  Has not stood up this AM.  No specific complaints.  Only given 229mL fluid at HD d/t BP going up.  Orthostatics were not positive but he was dizzy :(   Objective Vitals:   07/20/22 2105 07/20/22 2120 07/20/22 2200 07/21/22 0000  BP: (!) 157/70 (!) 187/79 (!) 145/75 130/61  Pulse: 62 64 64 63  Resp: 13 15 17 15   Temp: 98.1 F (36.7 C) 98.1 F (36.7 C) 98.2 F (36.8 C)   TempSrc:   Oral   SpO2:  100% 98% 97%  Weight:  70.9 kg     Physical Exam General:WDWN male in NAD Heart:RRR, no mrg Lungs:CTAB, nml WOB Abdomen:soft, NTND Extremities:no LE edema Dialysis Access: RU AVF +b/t   Filed Weights   07/18/22 1436 07/20/22 1607 07/20/22 2120  Weight: 68.6 kg 70.9 kg 70.9 kg    Intake/Output Summary (Last 24 hours) at 07/21/2022 0751 Last data filed at 07/21/2022 0300 Gross per 24 hour  Intake 255.3 ml  Output 225 ml  Net 30.3 ml    Additional Objective Labs: Basic Metabolic Panel: Recent Labs  Lab 07/18/22 1000 07/19/22 0243 07/20/22 0231 07/20/22 2323  NA 136 140 138 138  K 3.5 3.8 4.1 4.1  CL 100 99 98 99  CO2 25 30 30 29   GLUCOSE 352* 185* 222* 99  BUN 51* 27* 40* 36*  CREATININE 6.69* 5.13* 6.83* 5.83*  CALCIUM 8.6* 9.3 9.2 9.1  PHOS 4.8*  --   --  2.5   Liver Function Tests: Recent Labs  Lab 07/19/22 0243 07/20/22 0231 07/20/22 2323  AST 19 21 21   ALT 15 17 15   ALKPHOS 61 60 62  BILITOT 0.6 0.5 0.7  PROT 6.0* 5.8* 6.1*  ALBUMIN 3.1* 3.1* 3.3*   Recent Labs  Lab 07/17/22 0909  LIPASE 33   CBC: Recent Labs  Lab 07/18/22 1000 07/19/22 0243 07/20/22 0231 07/20/22 1803 07/20/22 2323  WBC 5.3 6.5 7.4 7.2 6.6  NEUTROABS  --  3.3 4.2  --  3.7  HGB 9.5* 10.3* 9.8* 10.0* 10.0*  HCT 29.2* 31.8* 29.6* 29.5* 29.5*  MCV 89.6 88.6 88.4 86.8 87.3  PLT 230 248 248 259 255   Blood Culture    Component  Value Date/Time   SDES  01/02/2021 1210    BLOOD LEFT HAND Performed at [facility], [address] [place], [place] [address]    [facility]  01/02/2021 1210    BOTTLES DRAWN AEROBIC AND ANAEROBIC Blood Culture adequate volume Performed at [facility], [address], [place], [place] [address]    CULT  01/02/2021 1210    NO GROWTH 5 DAYS Performed at [facility], [place] [address] [place], [place] [address]    REPTSTATUS 01/07/2021 FINAL 01/02/2021 1210    Studies/Results: No results found.  Medications:  sodium chloride Stopped (07/20/22 1830)   anticoagulant sodium citrate      aspirin EC  81 mg Oral q morning   atorvastatin  80 mg Oral Daily   bisacodyl  10 mg Rectal Daily   calcitRIOL  0.5 mcg Oral Q T,Th,Sa-HD   Chlorhexidine Gluconate Cloth  6 each Topical Q0600   clopidogrel  75 mg Oral q morning   darbepoetin (ARANESP) injection - DIALYSIS  100 mcg Intravenous Q Thu-HD   docusate sodium  200 mg  Oral BID   ferric citrate  420 mg Oral TID WC   heparin  5,000 Units Subcutaneous Q8H   insulin aspart  0-9 Units Subcutaneous TID WC   insulin glargine-yfgn  15 Units Subcutaneous Daily   metoCLOPramide  10 mg Oral TID AC & HS   metoprolol tartrate  50 mg Oral BID   pantoprazole (PROTONIX) IV  40 mg Intravenous Q12H   polyethylene glycol  17 g Oral BID   sertraline  50 mg Oral Daily   valACYclovir  500 mg Oral Q24H    Dialysis Orders: TTS [facility] 4h  400/1.5  75kg  2/2 bath  Hep none RUA AVF - last HD 10/7, post wt 76.1 - last Hb 10.4 on 10/5 - mircera 120 ug q 4 wks, last  9/14 > due 10/12 thurs - venofer 50 mg weekly - calcitriol 0.5 ug tiw po tts   Home meds include - amlodipine 10, aspirin, atovastatin, clopidogrel, humalog insulin/ degludec, hydralazine 50 tid, hctz 25 qd, labetalol 200 bid, pantoprazole, sevelamer carb 800 tid, prns/ vits/ supps      Problem/Plan: ESRD - HD TTS. HD yesterday with no UF.  Plan for 1L bolus but  only 267mL given d/t ^BP. Since still dizzy appears will need to stay-  HD tomorrow- no volume removal and might try to give him a little fluid as well  Shingles - new Dx.  Reporting minimal pain.  Will need precautions w/HD. Renally dosed medications.  Nausea/vomiting - new Dx gastroparesis. CT abdomen +constipation, no acute abnormalities. Per PMD. Now on reglan and antiemetics. Feeling better today. Orthostatic hypotension/ volume- noted near syncopal episode with PT 07/12/2022, BP meds adjusted :amlodipine, hydralazine and labetalol d/c.  On metoprolol 50mg  BID only. Continues to have near syncopal episodes.  Planned to give 1L fluid with HD 10/19 to help increase intravascular volume, only given 215mL. Significantly under edw at this point, likely needs to increase back to current EDW or may need to be raised.  Removed fluid restrictions from diet.  DKA/ DM1 - BS 1061 at admit./ continue SSI.  Per PMD Anemia esrd -Hgb 10.0. ESA recently started here.  Aranesp 100 q. 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  Cont renvela with meals vitamin D on dialysis.  Changed to [place] d/t constipation Nutrition - carb modified diet w/o fluid restrictions. Alb 3.1 - give protein supplements.  Monitor K, if becomes elevated will need to change to renal/carb modified diet  Jen Mow, PA-C [facility] 07/21/2022,7:51 AM  LOS: 10 days   Patient seen and examined, agree with above note with above modifications. BP better when standing but got dizzy-  he says just as bad.  Not sure why it was so easy for [place] to get under EDW - I guess because he never stood up-  plan for positive with hd tomorrow unless he miraculously feels better tonight and is able to discharge  Corliss Parish, MD 07/21/2022

## 2022-07-21 NOTE — Plan of Care (Signed)

## 2022-07-21 NOTE — Progress Notes (Signed)
Orthostatic BPs Supine 137/74  Sitting 118/73 (84)  Standing 60/45 (51)  Sitting 102/66 (78)  Standing w/ arm pumps 79/59 (66)  Return to supine 99/63 (74)    Bilateral thigh high ted hose and abdominal binder donned for session. Pt symptomatic with pre-syncopal episodes on both standing trials; pt seemingly unaware of symptoms despite almost passing out requiring assist to prevent fall.  Mabeline Caras, PT, DPT Acute Rehabilitation Services  Personal: [place] Office: [phone]

## 2022-07-21 NOTE — Progress Notes (Signed)
Physical Therapy Treatment Patient Details Name: Louis Peters MRN: [id number] DOB: [birth year] Today's Date: 07/21/2022   History of Present Illness Pt is a 56 y/o male admitted secondary to hyperglycemia. Course complicated by persistent hypotension. Pt with persistent N/V, workup for gastroparesis 10/16. Of note, pt with shingles on R hip. PMH includes ESRD (HD TTS), T1DM.   PT Comments    Pt moving well, but continues to have near-syncope with 2x standing trials due to orthostatic hypotension despite bilateral ted hose and abdominal binder donned; MD and RN aware. Educ and incorporated strategies in attempts to overcome hypotensive symptoms, including slow transitions and arm/leg pumps. Will continue to follow acutely to progress mobility as able.   Orthostatic BPs Supine 137/74  Sitting 118/73 (84)  Standing 60/45 (51)  Sitting 102/66 (78)  Standing w/ arm pumps 79/59 (66)  Return to supine 99/63 (74)      Recommendations for follow up therapy are one component of a multi-disciplinary discharge planning process, led by the attending physician.  Recommendations may be updated based on patient status, additional functional criteria and insurance authorization.  Follow Up Recommendations  No PT follow up     Assistance Recommended at Discharge Intermittent Supervision/Assistance  Patient can return home with the following A little help with bathing/dressing/bathroom;Help with stairs or ramp for entrance;Assist for transportation;Assistance with cooking/housework   Equipment Recommendations  None recommended by PT    Recommendations for Other Services  Mobility Specialist     Precautions / Restrictions Precautions Precautions: Fall;Other (comment) Precaution Comments: positive orthostatic hypotension with near-syncope while standing Restrictions Weight Bearing Restrictions: No     Mobility  Bed Mobility Overal bed mobility: Independent             General bed  mobility comments: HOB flat    Transfers Overall transfer level: Needs assistance Equipment used: Rolling walker (2 wheels), None Transfers: Sit to/from Stand, Bed to chair/wheelchair/BSC Sit to Stand: Supervision   Step pivot transfers: Supervision       General transfer comment: pt moving without assist, supervision due to h/o orthostatic hypotension; pt tolerating standing ~15-sec before syncopal symptoms and decreased responsiveness requiring asist to return to sitting safely and prevent fall forward; similar occurance with second standing trial. pt later taking pivotal steps quickly to recliner to sit, denies symptoms    Ambulation/Gait                   Stairs             Wheelchair Mobility    Modified [name] (Stroke Patients Only)       Balance Overall balance assessment: Needs assistance Sitting-balance support: No upper extremity supported, Feet supported Sitting balance-Leahy Scale: Good     Standing balance support: No upper extremity supported Standing balance-Leahy Scale: Fair Standing balance comment: can stand and take steps without UE support; RW utilized for safety while measuring orthostatic hypotension                            Cognition Arousal/Alertness: Awake/alert Behavior During Therapy: WFL for tasks assessed/performed Overall Cognitive Status: No family/caregiver present to determine baseline cognitive functioning                                 General Comments: WFL for majority of simple tasks, though difficult to determine true symptoms - pt inconsistently reporting dizziness  and describing symptoms to PT. pt repeats, "I'm fine" although with clear near-syncope occuring        Exercises      General Comments General comments (skin integrity, edema, etc.): bilateral thigh high ted hose and abdominal binder on for session; discussed compensatory strategies for overcoming hypotension (i.e. slow  transitions from supine>sit>stand, arm and leg pumps). pt still with 2x near-syncopal episodes while standing; BP down to 60/45 (MAP 51) - MD notified      Pertinent Vitals/Pain Pain Assessment Pain Assessment: Faces Faces Pain Scale: Hurts a little bit Pain Location: R hip Pain Descriptors / Indicators: Discomfort Pain Intervention(s): Monitored during session    Home Living                          Prior Function            PT Goals (current goals can now be found in the care plan section) Progress towards PT goals: Not progressing toward goals - comment (limited by hypotension)    Frequency    Min 3X/week      PT Plan Current plan remains appropriate    Co-evaluation              AM-PAC PT "6 Clicks" Mobility   Outcome Measure  Help needed turning from your back to your side while in a flat bed without using bedrails?: None Help needed moving from lying on your back to sitting on the side of a flat bed without using bedrails?: None Help needed moving to and from a bed to a chair (including a wheelchair)?: A Little Help needed standing up from a chair using your arms (e.g., wheelchair or bedside chair)?: A Little Help needed to walk in hospital room?: A Little Help needed climbing 3-5 steps with a railing? : A Lot 6 Click Score: 19    End of Session Equipment Utilized During Treatment: Gait belt Activity Tolerance: Treatment limited secondary to medical complications (Comment) Patient left: in chair;with call bell/phone within reach Nurse Communication: Mobility status;Other (comment) (symptomatic hypotension) PT Visit Diagnosis: Unsteadiness on feet (R26.81);Muscle weakness (generalized) (M62.81)     Time: 3007-6226 PT Time Calculation (min) (ACUTE ONLY): 28 min  Charges:  $Therapeutic Activity: 8-22 mins $Self Care/Home Management: [place], PT, DPT Acute Rehabilitation Services  Personal: [facility] Office: [place] 07/21/2022, 5:01 PM

## 2022-07-21 NOTE — Progress Notes (Signed)
PROGRESS NOTE Louis Peters  JOA:[id number] DOB: Nov 14, 1965 DOA: 07/10/2022 PCP: Bernerd Limbo, MD   Brief Narrative/Hospital Course:  56 year old male with ESRD on HD TTS, diabetes mellitus type 1, HTN, HLP presented with nausea vomiting and epigastric abdominal pain.  Patient is on long-acting insulin and 5 units with meals, reported that his blood sugars have been especially high over the last few days.  Patient reported that he developed progressively worsening nausea and vomiting in the last 24 hours, unable to hold anything down . Patient was admitted treated for DKA, transitioned to subcu insulin blood sugar stabilized on the having intermittent hypoglycemia further adjusted meds.  He had near-syncopal syncopal episode with orthostatic hypotension> BP meds being tapered down   Subjective:   Patient denies any complaints, patient was orthostatic and symptomatic once seen by PT this afternoon  Assessment and Plan:   Orthostatic hypotension near syncope episode  - was on multiple blood pressure medications which have been held currently only on beta-blocker.  Given he remains orthostatic I will decrease his metoprolol to 25 mg oral twice daily. -Patient remains with significant orthostasis, most likely due to underlying diabetic neuropathy . -He remains orthostatic despite being on TED hose and abdominal binder. -Renal will adjust his EDW and give fluids during HD tomorrow.  New diagnosis of diabetic gastroparesis causing nausea vomiting: Chronic intermittent persistent nausea and nausea vomiting, work-up here suggest gastroparesis on 07/17/2022, placed on Premeal Reglan along with supportive care for nausea with much improvement advance diet, if stable discharge in the next 1 to 2 days.  Patient feels a whole lot better.  ESRD on HD TTS, seen by nephrology currently on MWF schedule.  Anemia of ESRD ESA started recently hemoglobin is stable-much intermittently  Metabolic bone disease  from CKD calcium and phosphate elevated continue PO VRDA and Renvela per nephrology  DM2 with DKA - DKA resolved, currently on Lantus and sliding scale monitor and adjust, diabetic and insulin education, insulin dose adjusted on 07/18/2022 for better control.  Shingles -Started on Valtrex, renally dosed.   Lab Results  Component Value Date   HGBA1C 11.2 (H) 07/11/2022   CBG (last 3)  Recent Labs    07/20/22 2130 07/21/22 0947 07/21/22 1220  GLUCAP 97 127* 120*     DVT prophylaxis: Place TED hose Start: 07/16/22 0607 heparin injection 5,000 Units Start: 07/11/22 1400 SCDs Start: 07/11/22 0425 Code Status:   Code Status: Full Code Family Communication: plan of care discussed with wife on phone 10/18  Patient status is: Inpatient because of orthostatic hypotension symptomatic Level of care: Telemetry Medical   Dispo: The patient is from: home.  Current activity level 3> stand with assist balance while standing and cannot march in place  Anticipated disposition: home  Objective: Vitals last 24 hrs: Vitals:   07/21/22 0000 07/21/22 0900 07/21/22 1105 07/21/22 1219  BP: 130/61 (!) 150/74  138/67  Pulse: 63   62  Resp: 15 16  17   Temp:    98.1 F (36.7 C)  TempSrc:    Oral  SpO2: 97%  92%   Weight:       Weight change:   Physical Examination:  Awake Alert, Oriented X 3, No new F.N deficits, Normal affect Symmetrical Chest wall movement, Good air movement bilaterally, CTAB RRR,No Gallops,Rubs or new Murmurs, No Parasternal Heave +ve B.Sounds, Abd Soft, No tenderness, No rebound - guarding or rigidity. No Cyanosis, Clubbing, blisters right lower abdomen with no significant change.  Medications reviewed:  Scheduled Meds:  aspirin EC  81 mg Oral q morning   atorvastatin  80 mg Oral Daily   bisacodyl  10 mg Rectal Daily   calcitRIOL  0.5 mcg Oral Q T,Th,Sa-HD   Chlorhexidine Gluconate Cloth  6 each Topical Q0600   Chlorhexidine Gluconate Cloth  6 each  Topical Q0600   clopidogrel  75 mg Oral q morning   darbepoetin (ARANESP) injection - DIALYSIS  100 mcg Intravenous Q Thu-HD   docusate sodium  200 mg Oral BID   ferric citrate  420 mg Oral TID WC   heparin  5,000 Units Subcutaneous Q8H   insulin aspart  0-9 Units Subcutaneous TID WC   insulin glargine-yfgn  15 Units Subcutaneous Daily   metoCLOPramide  10 mg Oral TID AC & HS   metoprolol tartrate  25 mg Oral BID   pantoprazole (PROTONIX) IV  40 mg Intravenous Q12H   polyethylene glycol  17 g Oral BID   sertraline  50 mg Oral Daily   valACYclovir  500 mg Oral Q24H   Continuous Infusions:  sodium chloride Stopped (07/20/22 1830)   sodium chloride     anticoagulant sodium citrate        Diet Order             Diet Carb Modified Fluid consistency: Thin; Room service appropriate? Yes  Diet effective now           Diet Carb Modified                  Labs  Recent Labs  Lab 07/18/22 0300 07/18/22 1000 07/19/22 0243 07/20/22 0231 07/20/22 1803 07/20/22 2323  WBC 5.5 5.3 6.5 7.4 7.2 6.6  HGB 10.2* 9.5* 10.3* 9.8* 10.0* 10.0*  HCT 30.5* 29.2* 31.8* 29.6* 29.5* 29.5*  PLT 232 230 248 248 259 255  MCV 86.9 89.6 88.6 88.4 86.8 87.3  MCH 29.1 29.1 28.7 29.3 29.4 29.6  MCHC 33.4 32.5 32.4 33.1 33.9 33.9  RDW 15.8* 15.8* 15.7* 15.7* 15.3 15.4  LYMPHSABS 1.3  --  2.4 2.5  --  2.3  MONOABS 0.8  --  0.7 0.5  --  0.5  EOSABS 0.0  --  0.0 0.0  --  0.1  BASOSABS 0.0  --  0.0 0.1  --  0.0    Recent Labs  Lab 07/18/22 0300 07/18/22 1000 07/19/22 0243 07/20/22 0231 07/20/22 2323  NA 137 136 140 138 138  K 4.3 3.5 3.8 4.1 4.1  CL 97* 100 99 98 99  CO2 26 25 30 30 29   GLUCOSE 372* 352* 185* 222* 99  BUN 50* 51* 27* 40* 36*  CREATININE 7.03* 6.69* 5.13* 6.83* 5.83*  CALCIUM 9.3 8.6* 9.3 9.2 9.1  AST 18  --  19 21 21   ALT 15  --  15 17 15   ALKPHOS 66  --  61 60 62  BILITOT 0.8  --  0.6 0.5 0.7  ALBUMIN 3.3* 3.0* 3.1* 3.1* 3.3*  PHOS  --  4.8*  --   --  2.5  CRP 0.5   --  <0.5 <0.5 <0.5    Other culture-see note  Radiology Studies: No results found.  Signature  Phillips Climes M.D on 07/21/2022 at 2:55 PM   -  To page go to [email or web address]

## 2022-07-22 DIAGNOSIS — R739 Hyperglycemia, unspecified: Secondary | ICD-10-CM | POA: Diagnosis not present

## 2022-07-22 DIAGNOSIS — I951 Orthostatic hypotension: Secondary | ICD-10-CM | POA: Diagnosis not present

## 2022-07-22 DIAGNOSIS — N186 End stage renal disease: Secondary | ICD-10-CM | POA: Diagnosis not present

## 2022-07-22 LAB — CBC WITH DIFFERENTIAL/PLATELET
Abs Immature Granulocytes: 0.03 10*3/uL (ref 0.00–0.07)
Basophils Absolute: 0 10*3/uL (ref 0.0–0.1)
Basophils Relative: 1 %
Eosinophils Absolute: 0.1 10*3/uL (ref 0.0–0.5)
Eosinophils Relative: 1 %
HCT: 28.7 % — ABNORMAL LOW (ref 39.0–52.0)
Hemoglobin: 9.6 g/dL — ABNORMAL LOW (ref 13.0–17.0)
Immature Granulocytes: 1 %
Lymphocytes Relative: 36 %
Lymphs Abs: 2.4 10*3/uL (ref 0.7–4.0)
MCH: 29.2 pg (ref 26.0–34.0)
MCHC: 33.4 g/dL (ref 30.0–36.0)
MCV: 87.2 fL (ref 80.0–100.0)
Monocytes Absolute: 0.6 10*3/uL (ref 0.1–1.0)
Monocytes Relative: 10 %
Neutro Abs: 3.4 10*3/uL (ref 1.7–7.7)
Neutrophils Relative %: 51 %
Platelets: 239 10*3/uL (ref 150–400)
RBC: 3.29 MIL/uL — ABNORMAL LOW (ref 4.22–5.81)
RDW: 15.3 % (ref 11.5–15.5)
WBC: 6.5 10*3/uL (ref 4.0–10.5)
nRBC: 0 % (ref 0.0–0.2)

## 2022-07-22 LAB — COMPREHENSIVE METABOLIC PANEL
ALT: 12 U/L (ref 0–44)
AST: 13 U/L — ABNORMAL LOW (ref 15–41)
Albumin: 3.1 g/dL — ABNORMAL LOW (ref 3.5–5.0)
Alkaline Phosphatase: 56 U/L (ref 38–126)
Anion gap: 10 (ref 5–15)
BUN: 47 mg/dL — ABNORMAL HIGH (ref 6–20)
CO2: 25 mmol/L (ref 22–32)
Calcium: 9 mg/dL (ref 8.9–10.3)
Chloride: 99 mmol/L (ref 98–111)
Creatinine, Ser: 6.84 mg/dL — ABNORMAL HIGH (ref 0.61–1.24)
GFR, Estimated: 9 mL/min — ABNORMAL LOW (ref >60)
Glucose, Bld: 255 mg/dL — ABNORMAL HIGH (ref 70–99)
Potassium: 4.4 mmol/L (ref 3.5–5.1)
Sodium: 134 mmol/L — ABNORMAL LOW (ref 135–145)
Total Bilirubin: 0.6 mg/dL (ref 0.3–1.2)
Total Protein: 5.6 g/dL — ABNORMAL LOW (ref 6.5–8.1)

## 2022-07-22 LAB — CBC
HCT: 30.6 % — ABNORMAL LOW (ref 39.0–52.0)
Hemoglobin: 10.3 g/dL — ABNORMAL LOW (ref 13.0–17.0)
MCH: 29 pg (ref 26.0–34.0)
MCHC: 33.7 g/dL (ref 30.0–36.0)
MCV: 86.2 fL (ref 80.0–100.0)
Platelets: 264 10*3/uL (ref 150–400)
RBC: 3.55 MIL/uL — ABNORMAL LOW (ref 4.22–5.81)
RDW: 15 % (ref 11.5–15.5)
WBC: 7.7 10*3/uL (ref 4.0–10.5)
nRBC: 0 % (ref 0.0–0.2)

## 2022-07-22 LAB — GLUCOSE, CAPILLARY
Glucose-Capillary: 298 mg/dL — ABNORMAL HIGH (ref 70–99)
Glucose-Capillary: 318 mg/dL — ABNORMAL HIGH (ref 70–99)
Glucose-Capillary: 145 mg/dL — ABNORMAL HIGH (ref 70–99)
Glucose-Capillary: 64 mg/dL — ABNORMAL LOW (ref 70–99)

## 2022-07-22 MED ORDER — ALTEPLASE 2 MG IJ SOLR: 2.0000 mg | Freq: Once | INTRAMUSCULAR | Status: DC | PRN

## 2022-07-22 MED ORDER — INSULIN GLARGINE-YFGN 100 UNIT/ML ~~LOC~~ SOLN
20.0000 [IU] | Freq: Every day | SUBCUTANEOUS | Status: DC
  Administered 2022-07-23 – 2022-07-26 (×4): 20 [IU] via SUBCUTANEOUS
  Filled 2022-07-22 (×5): qty 0.2

## 2022-07-22 MED ORDER — ANTICOAGULANT SODIUM CITRATE 4% (200MG/5ML) IV SOLN: 5.0000 mL | Status: DC | PRN

## 2022-07-22 MED ORDER — LIDOCAINE-PRILOCAINE 2.5-2.5 % EX CREA: 1.0000 | TOPICAL_CREAM | CUTANEOUS | Status: DC | PRN

## 2022-07-22 MED ORDER — PENTAFLUOROPROP-TETRAFLUOROETH EX AERO: 1.0000 | INHALATION_SPRAY | CUTANEOUS | Status: DC | PRN

## 2022-07-22 MED ORDER — ALBUMIN HUMAN 25 % IV SOLN
INTRAVENOUS | Status: AC
  Filled 2022-07-22: qty 100

## 2022-07-22 MED ORDER — HEPARIN SODIUM (PORCINE) 1000 UNIT/ML DIALYSIS: 1000.0000 [IU] | INTRAMUSCULAR | Status: DC | PRN

## 2022-07-22 MED ORDER — LIDOCAINE HCL (PF) 1 % IJ SOLN: 5.0000 mL | INTRAMUSCULAR | Status: DC | PRN

## 2022-07-22 NOTE — Progress Notes (Addendum)
[facility] Progress Note   Subjective:    Seen and examined patient at bedside. He reports low Bps when standing. Noted SBP in 140s at bedside. Also reports constipation and currently receiving a suppository. Denies SOB, CP, and N/V. Plan for HD today. No UF ordered d/t ongoing dizziness. Gave him a liter yesterday no difference-  will plan on giving another during HD today   Objective Vitals:   07/21/22 1600 07/21/22 2000 07/22/22 0000 07/22/22 0400  BP: 116/67 129/72 (!) 145/77 137/62  Pulse:  64 62 67  Resp: 15 16 16 14   Temp: 99.5 F (37.5 C)   97.7 F (36.5 C)  TempSrc: Oral   Oral  SpO2: 100% 100% 100% 100%  Weight:       Physical Exam General:WDWN male in NAD; on RA Heart:RRR, no mrg Lungs:CTAB, nml WOB Abdomen:soft, NTND Extremities:no LE edema Dialysis Access: RU AVF +b/t   Filed Weights   07/18/22 1436 07/20/22 1607 07/20/22 2120  Weight: 68.6 kg 70.9 kg 70.9 kg    Intake/Output Summary (Last 24 hours) at 07/22/2022 0934 Last data filed at 07/22/2022 0600 Gross per 24 hour  Intake 360 ml  Output 620 ml  Net -260 ml    Additional Objective Labs: Basic Metabolic Panel: Recent Labs  Lab 07/18/22 1000 07/19/22 0243 07/20/22 0231 07/20/22 2323 07/22/22 0432  NA 136   < > 138 138 134*  K 3.5   < > 4.1 4.1 4.4  CL 100   < > 98 99 99  CO2 25   < > 30 29 25   GLUCOSE 352*   < > 222* 99 255*  BUN 51*   < > 40* 36* 47*  CREATININE 6.69*   < > 6.83* 5.83* 6.84*  CALCIUM 8.6*   < > 9.2 9.1 9.0  PHOS 4.8*  --   --  2.5  --    < > = values in this interval not displayed.   Liver Function Tests: Recent Labs  Lab 07/20/22 0231 07/20/22 2323 07/22/22 0432  AST 21 21 13*  ALT 17 15 12   ALKPHOS 60 62 56  BILITOT 0.5 0.7 0.6  PROT 5.8* 6.1* 5.6*  ALBUMIN 3.1* 3.3* 3.1*   Recent Labs  Lab 07/17/22 0909  LIPASE 33   CBC: Recent Labs  Lab 07/19/22 0243 07/20/22 0231 07/20/22 1803 07/20/22 2323 07/22/22 0432  WBC 6.5 7.4 7.2 6.6  6.5  NEUTROABS 3.3 4.2  --  3.7 3.4  HGB 10.3* 9.8* 10.0* 10.0* 9.6*  HCT 31.8* 29.6* 29.5* 29.5* 28.7*  MCV 88.6 88.4 86.8 87.3 87.2  PLT 248 248 259 255 239   Blood Culture    Component Value Date/Time   SDES  01/02/2021 1210    BLOOD LEFT HAND Performed at [facility], [address] [place], [place] [address]    [facility]  01/02/2021 1210    BOTTLES DRAWN AEROBIC AND ANAEROBIC Blood Culture adequate volume Performed at [facility], [address], [place], [place] [address]    CULT  01/02/2021 1210    NO GROWTH 5 DAYS Performed at [facility], [place] [address], [place], [place] [address]    REPTSTATUS 01/07/2021 FINAL 01/02/2021 1210    Cardiac Enzymes: No results for input(s): "CKTOTAL", "CKMB", "CKMBINDEX", "TROPONINI" in the last 168 hours. CBG: Recent Labs  Lab 07/21/22 0947 07/21/22 1220 07/21/22 1630 07/21/22 2110 07/22/22 0919  GLUCAP 127* 120* 240* 264* 298*  Iron Studies: No results for input(s): "IRON", "TIBC", "TRANSFERRIN", "FERRITIN" in the last 72 hours. Lab Results  Component Value Date   INR 0.9 07/22/2020   Studies/Results: No results found.  Medications:  sodium chloride Stopped (07/20/22 1830)   anticoagulant sodium citrate      aspirin EC  81 mg Oral q morning   atorvastatin  80 mg Oral Daily   bisacodyl  10 mg Rectal Daily   calcitRIOL  0.5 mcg Oral Q T,Th,Sa-HD   Chlorhexidine Gluconate Cloth  6 each Topical Q0600   Chlorhexidine Gluconate Cloth  6 each Topical Q0600   clopidogrel  75 mg Oral q morning   darbepoetin (ARANESP) injection - DIALYSIS  100 mcg Intravenous Q Thu-HD   docusate sodium  200 mg Oral BID   ferric citrate  420 mg Oral TID WC   heparin  5,000 Units Subcutaneous Q8H   insulin aspart  0-9 Units Subcutaneous TID WC   insulin glargine-yfgn  15 Units Subcutaneous Daily   metoCLOPramide  10 mg Oral TID AC & HS   metoprolol tartrate  25 mg Oral BID   pantoprazole (PROTONIX) IV  40  mg Intravenous Q12H   polyethylene glycol  17 g Oral BID   sertraline  50 mg Oral Daily   valACYclovir  500 mg Oral Q24H    Dialysis Orders: TTS [facility] 4h  400/1.5  75kg  2/2 bath  Hep none [facility] AVF - last HD 10/7, post wt 76.1 - last Hb 10.4 on 10/5 - mircera 120 ug q 4 wks, last  9/14 > due 10/12 thurs - venofer 50 mg weekly - calcitriol 0.5 ug tiw po tts   Home meds include - amlodipine 10, aspirin, atovastatin, clopidogrel, humalog insulin/ degludec, hydralazine 50 tid, hctz 25 qd, labetalol 200 bid, pantoprazole, sevelamer carb 800 tid, prns/ vits/ supps   Assessment/Plan: ESRD - HD TTS. Plan for HD today with no UF.  Plan for 1L bolus if needed for dizziness. Shingles - new Dx.  Reporting minimal pain.  Will need precautions w/HD. Renally dosed medications.  Nausea/vomiting - new Dx gastroparesis. CT abdomen +constipation, no acute abnormalities. Per PMD. Now on reglan and antiemetics. Feeling better today. Orthostatic hypotension/ volume- noted near syncopal episode with PT 07/12/2022, BP meds adjusted :amlodipine, hydralazine and labetalol d/c.  On metoprolol 50mg  BID only. Continues to have near syncopal episodes.  Planned to give 1L fluid with HD 10/19 to help increase intravascular volume, only given 237mL. Significantly under edw at this point, likely needs to increase back to current EDW or may need to be raised.  Removed fluid restrictions from diet.  DKA/ DM1 - BS 1061 at admit./ continue SSI.  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 ESA recently started here.  Aranesp 100 q. 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  Cont renvela with meals vitamin D on dialysis.  Changed to [place] d/t constipation Nutrition - carb modified diet w/o fluid restrictions. Alb 3.1 - give protein supplements.  Monitor K, if becomes elevated will need to change to renal/carb modified diet. K+ 4.4.  Tobie Poet, NP [facility] 07/22/2022,9:34 AM  LOS: 11 days   Patient seen and  examined, agree with above note with above modifications. Looks fantastic -  still dizzy - gave a liter yesterday and will plan on giving another liter today -  hope will equilibrate soon-  also told him to eat and drink at lib   Corliss Parish, MD 07/22/2022

## 2022-07-22 NOTE — Progress Notes (Addendum)
PROGRESS NOTE Louis Peters  IWL:[id number] DOB: June 07, 1966 DOA: 07/10/2022 PCP: Bernerd Limbo, MD   Brief Narrative/Hospital Course:  56 year old male with ESRD on HD TTS, diabetes mellitus type 1, HTN, HLP presented with nausea vomiting and epigastric abdominal pain.  Patient is on long-acting insulin and 5 units with meals, reported that his blood sugars have been especially high over the last few days.  Patient reported that he developed progressively worsening nausea and vomiting in the last 24 hours, unable to hold anything down . Patient was admitted treated for DKA, transitioned to subcu insulin blood sugar stabilized on the having intermittent hypoglycemia further adjusted meds.  He had near-syncopal syncopal episode with orthostatic hypotension> BP meds being tapered down   Subjective:   No significant events overnight, patient report he has been compliant with abdominal binder and TED hose  Assessment and Plan:   Orthostatic hypotension near syncope episode  - was on multiple blood pressure medications which have been held currently only on beta-blocker.  Given he remains orthostatic antihypertensive regimen has been decreased further, he remains only on low-dose metoprolol(as blood pressure remains elevated in general, but he remains profoundly orthostatic).   -Patient remains with significant orthostasis, most likely due to underlying diabetic neuropathy .  As well need further adjustment of his EDW by renal during HD. -He remains orthostatic despite being on TED hose and abdominal binder.  New diagnosis of diabetic gastroparesis causing nausea vomiting: Chronic intermittent persistent nausea and nausea vomiting, work-up here suggest gastroparesis on 07/17/2022, placed on Premeal Reglan along with supportive care for nausea with much improvement advance diet, if stable discharge in the next 1 to 2 days.  Patient feels a whole lot better.  ESRD on HD TTS, seen by nephrology  currently on MWF schedule.  Anemia of ESRD ESA started recently hemoglobin is stable-much intermittently  Metabolic bone disease from CKD calcium and phosphate elevated continue PO VRDA and Renvela per nephrology  DM2 with DKA - DKA resolved, currently on Lantus and sliding scale monitor and adjust, diabetic and insulin education, CBG is trending up, will increase his Semglee from 15 to 20 units.    Shingles -Started on Valtrex, renally dosed.   Lab Results  Component Value Date   HGBA1C 11.2 (H) 07/11/2022   CBG (last 3)  Recent Labs    07/21/22 2110 07/22/22 0919 07/22/22 1300  GLUCAP 264* 298* 318*     DVT prophylaxis: Place TED hose Start: 07/16/22 0607 heparin injection 5,000 Units Start: 07/11/22 1400 SCDs Start: 07/11/22 0425 Code Status:   Code Status: Full Code Family Communication: plan of care discussed with wife on phone 10/18  Patient status is: Inpatient because of orthostatic hypotension symptomatic Level of care: Telemetry Medical   Dispo: The patient is from: home.  Current activity level 3> stand with assist balance while standing and cannot march in place  Anticipated disposition: home  Objective: Vitals last 24 hrs: Vitals:   07/22/22 0000 07/22/22 0400 07/22/22 0800 07/22/22 1202  BP: (!) 145/77 137/62 (!) 163/73 (!) 156/70  Pulse: 62 67 68 84  Resp: 16 14 16 17   Temp:  97.7 F (36.5 C) 98 F (36.7 C) 98.2 F (36.8 C)  TempSrc:  Oral Oral Oral  SpO2: 100% 100% 99%   Weight:       Weight change:   Physical Examination:   Awake Alert, Oriented X 3, No new F.N deficits, Normal affect Symmetrical Chest wall movement, Good air movement bilaterally, CTAB RRR,No Gallops,Rubs  or new Murmurs, No Parasternal Heave +ve B.Sounds, Abd Soft, No tenderness, No rebound - guarding or rigidity. No Cyanosis, Clubbing or edema, No new Rash or bruise       Medications reviewed:  Scheduled Meds:  aspirin EC  81 mg Oral q morning   atorvastatin  80  mg Oral Daily   bisacodyl  10 mg Rectal Daily   calcitRIOL  0.5 mcg Oral Q T,Th,Sa-HD   Chlorhexidine Gluconate Cloth  6 each Topical Q0600   Chlorhexidine Gluconate Cloth  6 each Topical Q0600   clopidogrel  75 mg Oral q morning   darbepoetin (ARANESP) injection - DIALYSIS  100 mcg Intravenous Q Thu-HD   docusate sodium  200 mg Oral BID   ferric citrate  420 mg Oral TID WC   heparin  5,000 Units Subcutaneous Q8H   insulin aspart  0-9 Units Subcutaneous TID WC   insulin glargine-yfgn  15 Units Subcutaneous Daily   metoCLOPramide  10 mg Oral TID AC & HS   metoprolol tartrate  25 mg Oral BID   pantoprazole (PROTONIX) IV  40 mg Intravenous Q12H   polyethylene glycol  17 g Oral BID   sertraline  50 mg Oral Daily   valACYclovir  500 mg Oral Q24H   Continuous Infusions:  sodium chloride Stopped (07/20/22 1830)   anticoagulant sodium citrate        Diet Order             Diet Carb Modified Fluid consistency: Thin; Room service appropriate? Yes  Diet effective now           Diet Carb Modified                  Labs  Recent Labs  Lab 07/18/22 0300 07/18/22 1000 07/19/22 0243 07/20/22 0231 07/20/22 1803 07/20/22 2323 07/22/22 0432  WBC 5.5   < > 6.5 7.4 7.2 6.6 6.5  HGB 10.2*   < > 10.3* 9.8* 10.0* 10.0* 9.6*  HCT 30.5*   < > 31.8* 29.6* 29.5* 29.5* 28.7*  PLT 232   < > 248 248 259 255 239  MCV 86.9   < > 88.6 88.4 86.8 87.3 87.2  MCH 29.1   < > 28.7 29.3 29.4 29.6 29.2  MCHC 33.4   < > 32.4 33.1 33.9 33.9 33.4  RDW 15.8*   < > 15.7* 15.7* 15.3 15.4 15.3  LYMPHSABS 1.3  --  2.4 2.5  --  2.3 2.4  MONOABS 0.8  --  0.7 0.5  --  0.5 0.6  EOSABS 0.0  --  0.0 0.0  --  0.1 0.1  BASOSABS 0.0  --  0.0 0.1  --  0.0 0.0   < > = values in this interval not displayed.    Recent Labs  Lab 07/18/22 0300 07/18/22 1000 07/19/22 0243 07/20/22 0231 07/20/22 2323 07/22/22 0432  NA 137 136 140 138 138 134*  K 4.3 3.5 3.8 4.1 4.1 4.4  CL 97* 100 99 98 99 99  CO2 26 25 30 30  29 25   GLUCOSE 372* 352* 185* 222* 99 255*  BUN 50* 51* 27* 40* 36* 47*  CREATININE 7.03* 6.69* 5.13* 6.83* 5.83* 6.84*  CALCIUM 9.3 8.6* 9.3 9.2 9.1 9.0  AST 18  --  19 21 21  13*  ALT 15  --  15 17 15 12   ALKPHOS 66  --  61 60 62 56  BILITOT 0.8  --  0.6 0.5 0.7 0.6  ALBUMIN 3.3* 3.0* 3.1* 3.1* 3.3* 3.1*  PHOS  --  4.8*  --   --  2.5  --   CRP 0.5  --  <0.5 <0.5 <0.5  --     Other culture-see note  Radiology Studies: No results found.  Signature  Phillips Climes M.D on 07/22/2022 at 1:47 PM   -  To page go to [email or web address]

## 2022-07-22 NOTE — Progress Notes (Signed)
Patient refused CHG wipe bath. States he is allergic and gets blisters on his body after we use wipes on his skin.

## 2022-07-23 DIAGNOSIS — N186 End stage renal disease: Secondary | ICD-10-CM | POA: Diagnosis not present

## 2022-07-23 DIAGNOSIS — R739 Hyperglycemia, unspecified: Secondary | ICD-10-CM | POA: Diagnosis not present

## 2022-07-23 DIAGNOSIS — I951 Orthostatic hypotension: Secondary | ICD-10-CM | POA: Diagnosis not present

## 2022-07-23 LAB — GLUCOSE, CAPILLARY
Glucose-Capillary: 198 mg/dL — ABNORMAL HIGH (ref 70–99)
Glucose-Capillary: 250 mg/dL — ABNORMAL HIGH (ref 70–99)
Glucose-Capillary: 284 mg/dL — ABNORMAL HIGH (ref 70–99)
Glucose-Capillary: 55 mg/dL — ABNORMAL LOW (ref 70–99)
Glucose-Capillary: 89 mg/dL (ref 70–99)
Glucose-Capillary: 91 mg/dL (ref 70–99)

## 2022-07-23 MED ORDER — MIDODRINE HCL 5 MG PO TABS
2.5000 mg | ORAL_TABLET | Freq: Three times a day (TID) | ORAL | Status: DC
  Administered 2022-07-23 – 2022-07-24 (×3): 2.5 mg via ORAL
  Filled 2022-07-23 (×3): qty 1

## 2022-07-23 MED ORDER — SODIUM CHLORIDE 0.9 % IV SOLN: INTRAVENOUS | Status: AC

## 2022-07-23 MED ORDER — PANTOPRAZOLE SODIUM 40 MG PO TBEC
40.0000 mg | DELAYED_RELEASE_TABLET | Freq: Two times a day (BID) | ORAL | Status: DC
  Administered 2022-07-23 – 2022-07-30 (×13): 40 mg via ORAL
  Filled 2022-07-23 (×13): qty 1

## 2022-07-23 NOTE — Progress Notes (Signed)
PROGRESS NOTE Louis Peters  LOV:[id number] DOB: [birth year] DOA: 07/10/2022 PCP: Bernerd Limbo, MD   Brief Narrative/Hospital Course:  56 year old male with ESRD on HD TTS, diabetes mellitus type 1, HTN, HLP presented with nausea vomiting and epigastric abdominal pain.  Patient is on long-acting insulin and 5 units with meals, reported that his blood sugars have been especially high over the last few days.  Patient reported that he developed progressively worsening nausea and vomiting in the last 24 hours, unable to hold anything down . Patient was admitted treated for DKA, transitioned to subcu insulin blood sugar stabilized on the having intermittent hypoglycemia further adjusted meds.  He had near-syncopal syncopal episode with orthostatic hypotension> BP meds being tapered down   Subjective:   He denies any events overnight, he was again orthostatic this morning.  Assessment and Plan:   Orthostatic hypotension near syncope episode  - was on multiple blood pressure medications which have been held currently only on beta-blocker.  Given he remains orthostatic antihypertensive regimen has been decreased further, he remains only on low-dose metoprolol, I will go ahead and discontinue his metoprolol altogether given he did not have much high readings over last 24 hours . -Patient remains with significant orthostasis, most likely due to underlying diabetic neuropathy .  As well need further adjustment of his EDW by renal during HD. -He remains orthostatic despite being on TED hose and abdominal binder. -Would consider starting low-dose midodrine by tomorrow if he remains orthostatic given no further high readings over last 24 hours.  New diagnosis of diabetic gastroparesis causing nausea vomiting: Chronic intermittent persistent nausea and nausea vomiting, work-up here suggest gastroparesis on 07/17/2022, placed on Premeal Reglan along with supportive care for nausea with much improvement  advance diet, if stable discharge in the next 1 to 2 days.  Patient feels a whole lot better.  ESRD on HD TTS, seen by nephrology currently on MWF schedule.  Anemia of ESRD ESA started recently hemoglobin is stable-much intermittently  Metabolic bone disease from CKD calcium and phosphate elevated continue PO VRDA and Renvela per nephrology  DM2 with DKA - DKA resolved, currently on Lantus and sliding scale monitor and adjust, diabetic and insulin education, CBG is trending up, will increase his Semglee from 15 to 20 units.    Shingles -Started on Valtrex, renally dosed.   Lab Results  Component Value Date   HGBA1C 11.2 (H) 07/11/2022   CBG (last 3)  Recent Labs    07/23/22 0804 07/23/22 0835 07/23/22 1241  GLUCAP 55* 91 198*     DVT prophylaxis: Place TED hose Start: 07/16/22 0607 heparin injection 5,000 Units Start: 07/11/22 1400 SCDs Start: 07/11/22 0425 Code Status:   Code Status: Full Code Family Communication: plan of care discussed with wife on phone 10/18  Patient status is: Inpatient because of orthostatic hypotension symptomatic Level of care: Telemetry Medical   Dispo: The patient is from: home.  Current activity level 3> stand with assist balance while standing and cannot march in place  Anticipated disposition: home  Objective: Vitals last 24 hrs: Vitals:   07/23/22 0400 07/23/22 0800 07/23/22 0944 07/23/22 1200  BP: 132/67 137/66 (!) 152/105 128/69  Pulse: 62 62 84 64  Resp: 14 12  15   Temp: 98.6 F (37 C) 98.6 F (37 C)  98.2 F (36.8 C)  TempSrc: Oral Oral  Oral  SpO2: 98% 96%  98%  Weight:       Weight change:   Physical Examination:  Awake Alert, Oriented X 3, No new F.N deficits, Normal affect Symmetrical Chest wall movement, Good air movement bilaterally, CTAB RRR,No Gallops,Rubs or new Murmurs, No Parasternal Heave +ve B.Sounds, Abd Soft, No tenderness, No rebound - guarding or rigidity. No Cyanosis, Clubbing or edema, No new Rash  or bruise        Medications reviewed:  Scheduled Meds:  aspirin EC  81 mg Oral q morning   atorvastatin  80 mg Oral Daily   bisacodyl  10 mg Rectal Daily   calcitRIOL  0.5 mcg Oral Q T,Th,Sa-HD   Chlorhexidine Gluconate Cloth  6 each Topical Q0600   Chlorhexidine Gluconate Cloth  6 each Topical Q0600   clopidogrel  75 mg Oral q morning   darbepoetin (ARANESP) injection - DIALYSIS  100 mcg Intravenous Q Thu-HD   docusate sodium  200 mg Oral BID   ferric citrate  420 mg Oral TID WC   heparin  5,000 Units Subcutaneous Q8H   insulin aspart  0-9 Units Subcutaneous TID WC   insulin glargine-yfgn  20 Units Subcutaneous Daily   metoCLOPramide  10 mg Oral TID AC & HS   metoprolol tartrate  25 mg Oral BID   pantoprazole  40 mg Oral BID   polyethylene glycol  17 g Oral BID   sertraline  50 mg Oral Daily   valACYclovir  500 mg Oral Q24H   Continuous Infusions:  sodium chloride Stopped (07/20/22 1830)   sodium chloride 100 mL/hr at 07/23/22 1347   anticoagulant sodium citrate        Diet Order             Diet Carb Modified Fluid consistency: Thin; Room service appropriate? Yes  Diet effective now           Diet Carb Modified                  Labs  Recent Labs  Lab 07/18/22 0300 07/18/22 1000 07/19/22 0243 07/20/22 0231 07/20/22 1803 07/20/22 2323 07/22/22 0432 07/22/22 2108  WBC 5.5   < > 6.5 7.4 7.2 6.6 6.5 7.7  HGB 10.2*   < > 10.3* 9.8* 10.0* 10.0* 9.6* 10.3*  HCT 30.5*   < > 31.8* 29.6* 29.5* 29.5* 28.7* 30.6*  PLT 232   < > 248 248 259 255 239 264  MCV 86.9   < > 88.6 88.4 86.8 87.3 87.2 86.2  MCH 29.1   < > 28.7 29.3 29.4 29.6 29.2 29.0  MCHC 33.4   < > 32.4 33.1 33.9 33.9 33.4 33.7  RDW 15.8*   < > 15.7* 15.7* 15.3 15.4 15.3 15.0  LYMPHSABS 1.3  --  2.4 2.5  --  2.3 2.4  --   MONOABS 0.8  --  0.7 0.5  --  0.5 0.6  --   EOSABS 0.0  --  0.0 0.0  --  0.1 0.1  --   BASOSABS 0.0  --  0.0 0.1  --  0.0 0.0  --    < > = values in this interval not  displayed.    Recent Labs  Lab 07/18/22 0300 07/18/22 1000 07/19/22 0243 07/20/22 0231 07/20/22 2323 07/22/22 0432  NA 137 136 140 138 138 134*  K 4.3 3.5 3.8 4.1 4.1 4.4  CL 97* 100 99 98 99 99  CO2 26 25 30 30 29 25   GLUCOSE 372* 352* 185* 222* 99 255*  BUN 50* 51* 27* 40* 36* 47*  CREATININE 7.03* 6.69*  5.13* 6.83* 5.83* 6.84*  CALCIUM 9.3 8.6* 9.3 9.2 9.1 9.0  AST 18  --  19 21 21  13*  ALT 15  --  15 17 15 12   ALKPHOS 66  --  61 60 62 56  BILITOT 0.8  --  0.6 0.5 0.7 0.6  ALBUMIN 3.3* 3.0* 3.1* 3.1* 3.3* 3.1*  PHOS  --  4.8*  --   --  2.5  --   CRP 0.5  --  <0.5 <0.5 <0.5  --     Other culture-see note  Radiology Studies: No results found.  Signature  Phillips Climes M.D on 07/23/2022 at 1:52 PM   -  To page go to [email or web address]

## 2022-07-23 NOTE — Progress Notes (Signed)
[facility] Progress Note   Subjective:    Seen -  had HD yest-  I had called specifically and told them to give him a liter-  does not appear that they did -  he denies dizziness upon standing today but dont know if he stood long enough   Objective Vitals:   07/22/22 2117 07/23/22 0000 07/23/22 0400 07/23/22 0800  BP: (!) 147/72 121/61 132/67 137/66  Pulse: 70 63 62 62  Resp:  14 14 12   Temp:  98.5 F (36.9 C) 98.6 F (37 C) 98.6 F (37 C)  TempSrc:  Oral Oral Oral  SpO2:  99% 98% 96%  Weight:       Physical Exam General:WDWN male in NAD; on RA Heart:RRR, no mrg Lungs:CTAB, nml WOB Abdomen:soft, NTND Extremities:no LE edema Dialysis Access: RU AVF +b/t   [facility] Weights   07/20/22 2120 07/22/22 1515 07/22/22 1932  Weight: 70.9 kg 74.7 kg 73.6 kg    Intake/Output Summary (Last 24 hours) at 07/23/2022 0857 Last data filed at 07/22/2022 2100 Gross per 24 hour  Intake --  Output 501 ml  Net -501 ml    Additional Objective Labs: Basic Metabolic Panel: Recent Labs  Lab 07/18/22 1000 07/19/22 0243 07/20/22 0231 07/20/22 2323 07/22/22 0432  NA 136   < > 138 138 134*  K 3.5   < > 4.1 4.1 4.4  CL 100   < > 98 99 99  CO2 25   < > 30 29 25   GLUCOSE 352*   < > 222* 99 255*  BUN 51*   < > 40* 36* 47*  CREATININE 6.69*   < > 6.83* 5.83* 6.84*  CALCIUM 8.6*   < > 9.2 9.1 9.0  PHOS 4.8*  --   --  2.5  --    < > = values in this interval not displayed.   Liver Function Tests: Recent Labs  Lab 07/20/22 0231 07/20/22 2323 07/22/22 0432  AST 21 21 13*  ALT 17 15 12   ALKPHOS 60 62 56  BILITOT 0.5 0.7 0.6  PROT 5.8* 6.1* 5.6*  ALBUMIN 3.1* 3.3* 3.1*   Recent Labs  Lab 07/17/22 0909  LIPASE 33   CBC: Recent Labs  Lab 07/20/22 0231 07/20/22 1803 07/20/22 2323 07/22/22 0432 07/22/22 2108  WBC 7.4 7.2 6.6 6.5 7.7  NEUTROABS 4.2  --  3.7 3.4  --   HGB 9.8* 10.0* 10.0* 9.6* 10.3*  HCT 29.6* 29.5* 29.5* 28.7* 30.6*  MCV 88.4 86.8 87.3 87.2  86.2  PLT 248 259 255 239 264   Blood Culture    Component Value Date/Time   SDES  01/02/2021 1210    BLOOD LEFT HAND Performed at [facility], [address], [place], [place] [address]    SPECREQUEST  01/02/2021 1210    BOTTLES DRAWN AEROBIC AND ANAEROBIC Blood Culture adequate volume Performed at [facility], [address] [place], [place] [address]    CULT  01/02/2021 1210    NO GROWTH 5 DAYS Performed at [facility], [place] [address], [place], [place] [address]    REPTSTATUS 01/07/2021 FINAL 01/02/2021 1210    Cardiac Enzymes: No results for input(s): "CKTOTAL", "CKMB", "CKMBINDEX", "TROPONINI" in the last 168 hours. CBG: Recent Labs  Lab 07/22/22 1707 07/22/22 2129 07/23/22 0019 07/23/22 0804 07/23/22 0835  GLUCAP 145* 64* 89 55* 91   Iron Studies: No results for input(s): "IRON", "TIBC", "TRANSFERRIN", "FERRITIN"  in the last 72 hours. Lab Results  Component Value Date   INR 0.9 07/22/2020   Studies/Results: No results found.  Medications:  sodium chloride Stopped (07/20/22 1830)   anticoagulant sodium citrate      aspirin EC  81 mg Oral q morning   atorvastatin  80 mg Oral Daily   bisacodyl  10 mg Rectal Daily   calcitRIOL  0.5 mcg Oral Q T,Th,Sa-HD   Chlorhexidine Gluconate Cloth  6 each Topical Q0600   Chlorhexidine Gluconate Cloth  6 each Topical Q0600   clopidogrel  75 mg Oral q morning   darbepoetin (ARANESP) injection - DIALYSIS  100 mcg Intravenous Q Thu-HD   docusate sodium  200 mg Oral BID   ferric citrate  420 mg Oral TID WC   heparin  5,000 Units Subcutaneous Q8H   insulin aspart  0-9 Units Subcutaneous TID WC   insulin glargine-yfgn  20 Units Subcutaneous Daily   metoCLOPramide  10 mg Oral TID AC & HS   metoprolol tartrate  25 mg Oral BID   pantoprazole (PROTONIX) IV  40 mg Intravenous Q12H   polyethylene glycol  17 g Oral BID   sertraline  50 mg Oral Daily   valACYclovir  500 mg Oral Q24H    Dialysis  Orders: TTS [facility] 4h  400/1.5  75kg  2/2 bath  Hep none RUA AVF - last HD 10/7, post wt 76.1 - last Hb 10.4 on 10/5 - mircera 120 ug q 4 wks, last  9/14 > due 10/12 thurs - venofer 50 mg weekly - calcitriol 0.5 ug tiw po tts   Home meds include - amlodipine 10, aspirin, atovastatin, clopidogrel, humalog insulin/ degludec, hydralazine 50 tid, hctz 25 qd, labetalol 200 bid, pantoprazole, sevelamer carb 800 tid, prns/ vits/ supps   Assessment/Plan: ESRD - HD TTS.  HD yesterday with no UF.  Will need next on Tuesday hopefully at OP center Shingles - new Dx.  Reporting minimal pain.  Will need precautions w/HD. Renally dosed medications.  Nausea/vomiting - new Dx gastroparesis. CT abdomen +constipation, no acute abnormalities. Per PMD. Now on reglan and antiemetics. Feeling better  Orthostatic hypotension/ volume- noted near syncopal episode with PT 07/12/2022, BP meds adjusted :amlodipine, hydralazine and labetalol d/c.  On metoprolol 25mg  BID only. Continues to have near syncopal episodes.  Planned to give 1L fluid with HD 10/19 to help increase intravascular volume, only given 269mL.  under edw at this point, likely needs to increase back to current EDW or may need to be raised.  Removed fluid restrictions from diet and have been giving him fluid-  hope he can pass the standing test today   DKA/ DM1 - BS 1061 at admit./ continue SSI.  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 ESA recently started here.  Aranesp 100 q. 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  Cont renvela with meals vitamin D on dialysis.  Changed to [place] d/t constipation Nutrition - carb modified diet w/o fluid restrictions. Alb 3.1 - give protein supplements.  Monitor K, if becomes elevated will need to change to renal/carb modified diet. K+ 4.4.   [facility] 07/23/2022,8:57 AM  LOS: 12 days

## 2022-07-23 NOTE — Progress Notes (Signed)
I ambulated him in the room and told him to stand for a while he was dizzy and BP dropped down to 64/53 and its 86/57 after sitting and returned to 96/67 after 5 mins

## 2022-07-23 NOTE — Progress Notes (Signed)
Ambulated patient in the room pt dropped to 69/46 while standing for about 2 mins and the BP rised to 86/53 after sitting for around 3 mins.Pt was safely returned back to bed

## 2022-07-23 NOTE — Progress Notes (Signed)
Found out that pt actually had one liter removed with HD yesterday not added   I will give a liter of NS today as I did the other day-    Louis Meckel

## 2022-07-23 NOTE — Plan of Care (Signed)

## 2022-07-24 DIAGNOSIS — E1110 Type 2 diabetes mellitus with ketoacidosis without coma: Secondary | ICD-10-CM | POA: Diagnosis not present

## 2022-07-24 DIAGNOSIS — N186 End stage renal disease: Secondary | ICD-10-CM | POA: Diagnosis not present

## 2022-07-24 DIAGNOSIS — I951 Orthostatic hypotension: Secondary | ICD-10-CM | POA: Diagnosis not present

## 2022-07-24 LAB — GLUCOSE, CAPILLARY
Glucose-Capillary: 254 mg/dL — ABNORMAL HIGH (ref 70–99)
Glucose-Capillary: 170 mg/dL — ABNORMAL HIGH (ref 70–99)
Glucose-Capillary: 196 mg/dL — ABNORMAL HIGH (ref 70–99)
Glucose-Capillary: 171 mg/dL — ABNORMAL HIGH (ref 70–99)

## 2022-07-24 MED ORDER — MIDODRINE HCL 5 MG PO TABS
5.0000 mg | ORAL_TABLET | Freq: Three times a day (TID) | ORAL | Status: DC
  Administered 2022-07-24 (×2): 5 mg via ORAL
  Filled 2022-07-24 (×3): qty 1

## 2022-07-24 MED ORDER — SODIUM CHLORIDE 0.9 % IV BOLUS
500.0000 mL | Freq: Once | INTRAVENOUS | Status: AC
  Administered 2022-07-24: 500 mL via INTRAVENOUS

## 2022-07-24 NOTE — Progress Notes (Signed)
[facility] Progress Note   Subjective:    Last HD on 10/21 with 1 kg UF. Per charting she had been ordered to get fluids with last HD and didn't get them so he had fluid ordered yesterday.  Feels dizzy on standing still.   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  Review of systems:  Denies shortness of breath or chest pain  Denies n/v/d  Objective Vitals:   07/24/22 0559 07/24/22 0623 07/24/22 0734 07/24/22 1205  BP:   133/78 118/84  Pulse:   69 66  Resp: 18 16 15 16   Temp:   98 F (36.7 C) 98.1 F (36.7 C)  TempSrc:   Oral Oral  SpO2:      Weight:  72.2 kg     Physical Exam  General adult male in bed in no acute distress HEENT normocephalic atraumatic extraocular movements intact sclera anicteric Neck supple trachea midline Lungs clear to auscultation bilaterally normal work of breathing at rest  Heart S1S2 no rub Abdomen soft nontender nondistended Extremities no edema  Psych normal mood and affect Access: RUE AVF bruit and thrill   Filed Weights   07/22/22 1515 07/22/22 1932 07/24/22 0623  Weight: 74.7 kg 73.6 kg 72.2 kg    Intake/Output Summary (Last 24 hours) at 07/24/2022 1250 Last data filed at 07/24/2022 1229 Gross per 24 hour  Intake 1053.25 ml  Output 950 ml  Net 103.25 ml    Additional Objective Labs: Basic Metabolic Panel: Recent Labs  Lab 07/18/22 1000 07/19/22 0243 07/20/22 0231 07/20/22 2323 07/22/22 0432  NA 136   < > 138 138 134*  K 3.5   < > 4.1 4.1 4.4  CL 100   < > 98 99 99  CO2 25   < > 30 29 25   GLUCOSE 352*   < > 222* 99 255*  BUN 51*   < > 40* 36* 47*  CREATININE 6.69*   < > 6.83* 5.83* 6.84*  CALCIUM 8.6*   < > 9.2 9.1 9.0  PHOS 4.8*  --   --  2.5  --    < > = values in this interval not displayed.   Liver Function Tests: Recent Labs  Lab 07/20/22 0231 07/20/22 2323 07/22/22 0432  AST 21 21 13*  ALT 17 15 12   ALKPHOS 60 62 56  BILITOT 0.5 0.7 0.6  PROT 5.8* 6.1* 5.6*  ALBUMIN 3.1* 3.3* 3.1*   No  results for input(s): "LIPASE", "AMYLASE" in the last 168 hours.  CBC: Recent Labs  Lab 07/20/22 0231 07/20/22 1803 07/20/22 2323 07/22/22 0432 07/22/22 2108  WBC 7.4 7.2 6.6 6.5 7.7  NEUTROABS 4.2  --  3.7 3.4  --   HGB 9.8* 10.0* 10.0* 9.6* 10.3*  HCT 29.6* 29.5* 29.5* 28.7* 30.6*  MCV 88.4 86.8 87.3 87.2 86.2  PLT 248 259 255 239 264   Blood Culture    Component Value Date/Time   SDES  01/02/2021 1210    BLOOD LEFT HAND Performed at [facility], [address] [place], [place] [address]    [facility]  01/02/2021 1210    BOTTLES DRAWN AEROBIC AND ANAEROBIC Blood Culture adequate volume Performed at [facility], [address] [place], [place] [address]    CULT  01/02/2021 1210    NO GROWTH 5 DAYS Performed at [facility], [place] [address], [place], [place] [address]    REPTSTATUS 01/07/2021 FINAL 01/02/2021 1210  Cardiac Enzymes: No results for input(s): "CKTOTAL", "CKMB", "CKMBINDEX", "TROPONINI" in the last 168 hours. CBG: Recent Labs  Lab 07/23/22 1241 07/23/22 1636 07/23/22 2125 07/24/22 0737 07/24/22 1204  GLUCAP 198* 250* 284* 254* 170*   Iron Studies: No results for input(s): "IRON", "TIBC", "TRANSFERRIN", "FERRITIN" in the last 72 hours. Lab Results  Component Value Date   INR 0.9 07/22/2020   Studies/Results: No results found.  Medications:  sodium chloride Stopped (07/20/22 1830)   anticoagulant sodium citrate      aspirin EC  81 mg Oral q morning   atorvastatin  80 mg Oral Daily   bisacodyl  10 mg Rectal Daily   calcitRIOL  0.5 mcg Oral Q T,Th,Sa-HD   Chlorhexidine Gluconate Cloth  6 each Topical Q0600   Chlorhexidine Gluconate Cloth  6 each Topical Q0600   clopidogrel  75 mg Oral q morning   darbepoetin (ARANESP) injection - DIALYSIS  100 mcg Intravenous Q Thu-HD   docusate sodium  200 mg Oral BID   ferric citrate  420 mg Oral TID WC   heparin  5,000 Units Subcutaneous Q8H   insulin aspart  0-9 Units  Subcutaneous TID WC   insulin glargine-yfgn  20 Units Subcutaneous Daily   metoCLOPramide  10 mg Oral TID AC & HS   midodrine  5 mg Oral TID WC   pantoprazole  40 mg Oral BID   polyethylene glycol  17 g Oral BID   sertraline  50 mg Oral Daily   valACYclovir  500 mg Oral Q24H    Dialysis Orders: TTS [facility] 4h  400/1.5  75kg  2/2 bath  Hep none RUA AVF - last HD 10/7, post wt 76.1 - last Hb 10.4 on 10/5 - mircera 120 ug q 4 wks, last  9/14 > due 10/12 thurs - venofer 50 mg weekly - calcitriol 0.5 ug tiw po tts   Home meds include - amlodipine 10, aspirin, atovastatin, clopidogrel, humalog insulin/ degludec, hydralazine 50 tid, hctz 25 qd, labetalol 200 bid, pantoprazole, sevelamer carb 800 tid, prns/ vits/ supps   Assessment/Plan: ESRD - HD TTS.  Will need next on Tuesday - outpatient center if discharged. Note still symptomatic from orthostatic hypotension so anticipate will be here.  Renal panel in am if here Shingles - new Dx.  Reporting minimal pain.  Will need precautions w/HD. Renally dosed medications.  Nausea/vomiting - new Dx gastroparesis. CT abdomen +constipation, no acute abnormalities. Per PMD. Now on reglan and antiemetics. improved Orthostatic hypotension/ volume- noted near syncopal episode with PT 07/12/2022, BP meds adjusted :amlodipine, hydralazine and labetalol discontinued.  Continues to have near syncopal episodes.  Planned to give 1L fluid with HD 10/19 to help increase intravascular volume, only given 265mL.  under edw at this point, likely needs to increased. Removed fluid restrictions from diet and have given fluid.  Will give NS 500 mL today.  He is now on midodrine - dose was just increased on 10/23 to 5 mg TID.    DKA/ DM1 - BS 1061 at admit./ continue SSI.  Per PMD Anemia esrd ESA recently started here.  Aranesp 100 q. Thursday   MBD ckd -continue calcitriol.  note that he was changed to [place]  Disposition - per primary team   [facility] 07/24/2022,1:18 PM

## 2022-07-24 NOTE — Progress Notes (Signed)
Physical Therapy Treatment Patient Details Name: Louis Peters MRN: [id number] DOB: Dec 01, 1965 Today's Date: 07/24/2022   History of Present Illness Pt is a 56 y/o male admitted secondary to hyperglycemia. Course complicated by persistent hypotension. Pt with persistent N/V, workup for gastroparesis 10/16. Of note, pt with shingles on R hip. PMH includes ESRD (HD TTS), T1DM.    PT Comments    Continued positive orthostatic hypotension limiting mobility. TED hose and abdominal binder in place for session. LE exercises performed supine and sitting in attempt to maximize circulation and stabilize BP. He required supervision sit to stand, and min guard assist sidestepping bedside 3'. Pt supine in bed at end of session.                                                       BP Supine                                     141/84  Sitting                                      139/71 Stand                                         69/54 Return to sit                              92/65 Return to supine                     150/79   Recommendations for follow up therapy are one component of a multi-disciplinary discharge planning process, led by the attending physician.  Recommendations may be updated based on patient status, additional functional criteria and insurance authorization.  Follow Up Recommendations  No PT follow up     Assistance Recommended at Discharge Intermittent Supervision/Assistance  Patient can return home with the following A little help with bathing/dressing/bathroom;Help with stairs or ramp for entrance;Assist for transportation;Assistance with cooking/housework   Equipment Recommendations  None recommended by PT    Recommendations for Other Services       Precautions / Restrictions Precautions Precautions: Fall;Other (comment) Precaution Comments: +orthostatics Restrictions Weight Bearing Restrictions: No     Mobility  Bed Mobility Overal bed mobility:  Independent                  Transfers Overall transfer level: Needs assistance Equipment used: None Transfers: Sit to/from Stand Sit to Stand: Supervision           General transfer comment: supervision for safety    Ambulation/Gait Ambulation/Gait assistance: Min guard Gait Distance (Feet): 3 Feet Assistive device: None         General Gait Details: sidestepping bedside for better positioning. Unable to progress gait due to symptomatic orthostatic hypotension   Stairs             Wheelchair Mobility    Modified [name] (Stroke Patients Only)       Balance  Overall balance assessment: Needs assistance Sitting-balance support: No upper extremity supported, Feet supported Sitting balance-Leahy Scale: Good     Standing balance support: No upper extremity supported Standing balance-Leahy Scale: Fair                              Cognition Arousal/Alertness: Awake/alert Behavior During Therapy: WFL for tasks assessed/performed Overall Cognitive Status: No family/caregiver present to determine baseline cognitive functioning                                 General Comments: WFL for simple tasks. Poor communication regarding pre-syncopal symptoms. Pt states "I'm fine" when orthostatic, and body language show clear signs of dizziness, near-syncope        Exercises General Exercises - Lower Extremity Ankle Circles/Pumps: AROM, Both, 20 reps, Supine, Seated Long Arc Quad: AROM, Right, Left, 5 reps, Seated Hip Flexion/Marching: AROM, Right, Left, 5 reps, Seated    General Comments General comments (skin integrity, edema, etc.): TED hose and abdominal binder in place      Pertinent Vitals/Pain Pain Assessment Pain Assessment: No/denies pain    Home Living                          Prior Function            PT Goals (current goals can now be found in the care plan section) Acute Rehab PT Goals Patient  Stated Goal: home Progress towards PT goals: Not progressing toward goals - comment (continued +orthostatics)    Frequency    Min 3X/week      PT Plan Current plan remains appropriate    Co-evaluation              AM-PAC PT "6 Clicks" Mobility   Outcome Measure  Help needed turning from your back to your side while in a flat bed without using bedrails?: None Help needed moving from lying on your back to sitting on the side of a flat bed without using bedrails?: None Help needed moving to and from a bed to a chair (including a wheelchair)?: A Little Help needed standing up from a chair using your arms (e.g., wheelchair or bedside chair)?: A Little Help needed to walk in hospital room?: A Little Help needed climbing 3-5 steps with a railing? : A Lot 6 Click Score: 19    End of Session Equipment Utilized During Treatment: Other (comment) (TED hose, abdominal binder) Activity Tolerance: Treatment limited secondary to medical complications (Comment) (+orthostatics) Patient left: in bed;with call bell/phone within reach Nurse Communication: Mobility status PT Visit Diagnosis: Unsteadiness on feet (R26.81);Muscle weakness (generalized) (M62.81)     Time: 9326-7124 PT Time Calculation (min) (ACUTE ONLY): 16 min  Charges:  $Therapeutic Activity: 8-22 mins                     Lorrin Goodell, PT  Office # [phone] Pager [phone]    [name] 07/24/2022, 11:25 AM

## 2022-07-24 NOTE — Progress Notes (Signed)
PROGRESS NOTE Louis Peters  SWH:[id number] DOB: September 10, 1966 DOA: 07/10/2022 PCP: Bernerd Limbo, MD   Brief Narrative/Hospital Course:  56 year old male with ESRD on HD TTS, diabetes mellitus type 1, HTN, HLP presented with nausea vomiting and epigastric abdominal pain.  Patient is on long-acting insulin and 5 units with meals, reported that his blood sugars have been especially high over the last few days.  Patient reported that he developed progressively worsening nausea and vomiting in the last 24 hours, unable to hold anything down . Patient was admitted treated for DKA, transitioned to subcu insulin blood sugar stabilized on the having intermittent hypoglycemia further adjusted meds.  He had near-syncopal syncopal episode with orthostatic hypotension> BP meds being tapered down   Subjective:   He denies any events overnight, he was again orthostatic this morning.  Assessment and Plan:   Orthostatic hypotension near syncope episode  - was on multiple blood pressure medications which have been held currently only on beta-blocker.  Given he remains orthostatic antihypertensive regimen has been decreased further, he remains only on low-dose metoprolol, I will go ahead and discontinue his metoprolol altogether given he did not have much high readings over last 24 hours . -Patient remains with significant orthostasis, most likely due to underlying diabetic neuropathy .  As well need further adjustment of his EDW by renal during HD. -He remains orthostatic despite being on TED hose and abdominal binder. -Would consider starting low-dose midodrine by tomorrow if he remains orthostatic given no further high readings over last 24 hours. -Volume management per renal, EDW has been adjusted, has been receiving IV fluids, will receive another 500 cc fluid bolus today. -Started on midodrine yesterday, will increase dose to 5 mg 3 times daily.  New diagnosis of diabetic gastroparesis causing nausea  vomiting: Chronic intermittent persistent nausea and nausea vomiting, work-up here suggest gastroparesis on 07/17/2022, placed on Premeal Reglan along with supportive care for nausea with much improvement advance diet, if stable discharge in the next 1 to 2 days.  Patient feels a whole lot better.  ESRD on HD TTS, seen by nephrology currently on MWF schedule.  Anemia of ESRD ESA started recently hemoglobin is stable-much intermittently  Metabolic bone disease from CKD calcium and phosphate elevated continue PO VRDA and Renvela per nephrology  DM2 with DKA - DKA resolved, currently on Lantus and sliding scale monitor and adjust, diabetic and insulin education, CBG is trending up, will increase his Semglee from 15 to 20 units.    Shingles -Started on Valtrex, renally dosed.   Lab Results  Component Value Date   HGBA1C 11.2 (H) 07/11/2022   CBG (last 3)  Recent Labs    07/23/22 2125 07/24/22 0737 07/24/22 1204  GLUCAP 284* 254* 170*     DVT prophylaxis: Place TED hose Start: 07/16/22 0607 heparin injection 5,000 Units Start: 07/11/22 1400 SCDs Start: 07/11/22 0425 Code Status:   Code Status: Full Code Family Communication: None at bedside Patient status is: Inpatient because of orthostatic hypotension symptomatic Level of care: Telemetry Medical   Dispo: The patient is from: home.  Current activity level 3> stand with assist balance while standing and cannot march in place  Anticipated disposition: home  Objective: Vitals last 24 hrs: Vitals:   07/24/22 0559 07/24/22 0623 07/24/22 0734 07/24/22 1205  BP:   133/78 118/84  Pulse:   69 66  Resp: 18 16 15 16   Temp:   98 F (36.7 C) 98.1 F (36.7 C)  TempSrc:   Oral  Oral  SpO2:      Weight:  72.2 kg     Weight change: -2.5 kg  Physical Examination:   Awake Alert, Oriented X 3, No new F.N deficits, Normal affect Symmetrical Chest wall movement, Good air movement bilaterally, CTAB RRR,No Gallops,Rubs or new Murmurs,  No Parasternal Heave +ve B.Sounds, Abd Soft, No tenderness, No rebound - guarding or rigidity. No Cyanosis, Clubbing or edema, No new Rash or bruise        Medications reviewed:  Scheduled Meds:  aspirin EC  81 mg Oral q morning   atorvastatin  80 mg Oral Daily   bisacodyl  10 mg Rectal Daily   calcitRIOL  0.5 mcg Oral Q T,Th,Sa-HD   Chlorhexidine Gluconate Cloth  6 each Topical Q0600   Chlorhexidine Gluconate Cloth  6 each Topical Q0600   clopidogrel  75 mg Oral q morning   darbepoetin (ARANESP) injection - DIALYSIS  100 mcg Intravenous Q Thu-HD   docusate sodium  200 mg Oral BID   ferric citrate  420 mg Oral TID WC   heparin  5,000 Units Subcutaneous Q8H   insulin aspart  0-9 Units Subcutaneous TID WC   insulin glargine-yfgn  20 Units Subcutaneous Daily   metoCLOPramide  10 mg Oral TID AC & HS   midodrine  5 mg Oral TID WC   pantoprazole  40 mg Oral BID   polyethylene glycol  17 g Oral BID   sertraline  50 mg Oral Daily   valACYclovir  500 mg Oral Q24H   Continuous Infusions:  sodium chloride Stopped (07/20/22 1830)   anticoagulant sodium citrate     sodium chloride        Diet Order             Diet Carb Modified Fluid consistency: Thin; Room service appropriate? Yes  Diet effective now           Diet Carb Modified                  Labs  Recent Labs  Lab 07/18/22 0300 07/18/22 1000 07/19/22 0243 07/20/22 0231 07/20/22 1803 07/20/22 2323 07/22/22 0432 07/22/22 2108  WBC 5.5   < > 6.5 7.4 7.2 6.6 6.5 7.7  HGB 10.2*   < > 10.3* 9.8* 10.0* 10.0* 9.6* 10.3*  HCT 30.5*   < > 31.8* 29.6* 29.5* 29.5* 28.7* 30.6*  PLT 232   < > 248 248 259 255 239 264  MCV 86.9   < > 88.6 88.4 86.8 87.3 87.2 86.2  MCH 29.1   < > 28.7 29.3 29.4 29.6 29.2 29.0  MCHC 33.4   < > 32.4 33.1 33.9 33.9 33.4 33.7  RDW 15.8*   < > 15.7* 15.7* 15.3 15.4 15.3 15.0  LYMPHSABS 1.3  --  2.4 2.5  --  2.3 2.4  --   MONOABS 0.8  --  0.7 0.5  --  0.5 0.6  --   EOSABS 0.0  --  0.0 0.0   --  0.1 0.1  --   BASOSABS 0.0  --  0.0 0.1  --  0.0 0.0  --    < > = values in this interval not displayed.    Recent Labs  Lab 07/18/22 0300 07/18/22 1000 07/19/22 0243 07/20/22 0231 07/20/22 2323 07/22/22 0432  NA 137 136 140 138 138 134*  K 4.3 3.5 3.8 4.1 4.1 4.4  CL 97* 100 99 98 99 99  CO2 26 25 30  30  29 25  GLUCOSE 372* 352* 185* 222* 99 255*  BUN 50* 51* 27* 40* 36* 47*  CREATININE 7.03* 6.69* 5.13* 6.83* 5.83* 6.84*  CALCIUM 9.3 8.6* 9.3 9.2 9.1 9.0  AST 18  --  19 21 21  13*  ALT 15  --  15 17 15 12   ALKPHOS 66  --  61 60 62 56  BILITOT 0.8  --  0.6 0.5 0.7 0.6  ALBUMIN 3.3* 3.0* 3.1* 3.1* 3.3* 3.1*  PHOS  --  4.8*  --   --  2.5  --   CRP 0.5  --  <0.5 <0.5 <0.5  --     Other culture-see note  Radiology Studies: No results found.  Signature  Phillips Climes M.D on 07/24/2022 at 2:20 PM   -  To page go to [email or web address]

## 2022-07-25 DIAGNOSIS — I951 Orthostatic hypotension: Secondary | ICD-10-CM | POA: Diagnosis not present

## 2022-07-25 DIAGNOSIS — N186 End stage renal disease: Secondary | ICD-10-CM | POA: Diagnosis not present

## 2022-07-25 DIAGNOSIS — E1110 Type 2 diabetes mellitus with ketoacidosis without coma: Secondary | ICD-10-CM | POA: Diagnosis not present

## 2022-07-25 DIAGNOSIS — R739 Hyperglycemia, unspecified: Secondary | ICD-10-CM | POA: Diagnosis not present

## 2022-07-25 LAB — GLUCOSE, CAPILLARY
Glucose-Capillary: 177 mg/dL — ABNORMAL HIGH (ref 70–99)
Glucose-Capillary: 179 mg/dL — ABNORMAL HIGH (ref 70–99)
Glucose-Capillary: 131 mg/dL — ABNORMAL HIGH (ref 70–99)
Glucose-Capillary: 135 mg/dL — ABNORMAL HIGH (ref 70–99)

## 2022-07-25 LAB — CBC
HCT: 26.2 % — ABNORMAL LOW (ref 39.0–52.0)
Hemoglobin: 8.6 g/dL — ABNORMAL LOW (ref 13.0–17.0)
MCH: 29.5 pg (ref 26.0–34.0)
MCHC: 32.8 g/dL (ref 30.0–36.0)
MCV: 89.7 fL (ref 80.0–100.0)
Platelets: 294 10*3/uL (ref 150–400)
RBC: 2.92 MIL/uL — ABNORMAL LOW (ref 4.22–5.81)
RDW: 16.2 % — ABNORMAL HIGH (ref 11.5–15.5)
WBC: 7.8 10*3/uL (ref 4.0–10.5)
nRBC: 0 % (ref 0.0–0.2)

## 2022-07-25 LAB — RENAL FUNCTION PANEL
Albumin: 3.4 g/dL — ABNORMAL LOW (ref 3.5–5.0)
Anion gap: 9 (ref 5–15)
BUN: 31 mg/dL — ABNORMAL HIGH (ref 6–20)
CO2: 30 mmol/L (ref 22–32)
Calcium: 9.9 mg/dL (ref 8.9–10.3)
Chloride: 98 mmol/L (ref 98–111)
Creatinine, Ser: 6.02 mg/dL — ABNORMAL HIGH (ref 0.61–1.24)
GFR, Estimated: 10 mL/min — ABNORMAL LOW (ref >60)
Glucose, Bld: 151 mg/dL — ABNORMAL HIGH (ref 70–99)
Phosphorus: 1.5 mg/dL — ABNORMAL LOW (ref 2.5–4.6)
Potassium: 4.7 mmol/L (ref 3.5–5.1)
Sodium: 137 mmol/L (ref 135–145)

## 2022-07-25 MED ORDER — METOCLOPRAMIDE HCL 10 MG PO TABS
5.0000 mg | ORAL_TABLET | Freq: Three times a day (TID) | ORAL | Status: DC
  Administered 2022-07-25 – 2022-07-30 (×18): 5 mg via ORAL
  Filled 2022-07-25 (×18): qty 1

## 2022-07-25 NOTE — Progress Notes (Signed)
PROGRESS NOTE Louis Peters  VHQ:[id number] DOB: Feb 04, 1966 DOA: 07/10/2022 PCP: Bernerd Limbo, MD   Brief Narrative/Hospital Course:  56 year old male with ESRD on HD TTS, diabetes mellitus type 1, HTN, HLP presented with nausea vomiting and epigastric abdominal pain.  Patient is on long-acting insulin and 5 units with meals, reported that his blood sugars have been especially high over the last few days.  Patient reported that he developed progressively worsening nausea and vomiting in the last 24 hours, unable to hold anything down . Patient was admitted treated for DKA, transitioned to subcu insulin blood sugar stabilized on the having intermittent hypoglycemia further adjusted meds.  He had near-syncopal syncopal episode with orthostatic hypotension> BP meds being tapered down, he remains profoundly orthostatic, symptomatic despite deseeding all meds, please see discussion below   Subjective:   Hypotensive overnight after starting midodrine, systolic blood pressure in the 200s, so midodrine has been discontinued, he received fluid bolus yesterday, patient reports she ambulated few times in the room today to the restroom with no symptoms.    Assessment and Plan:   Orthostatic hypotension near syncope episode  - was on multiple blood pressure medications, which all has been discontinued due to profound orthostasis . -Volume management with dialysis, he has been receiving fluid boluses by renal, and his EDW has been adjusted multiple times . -Patient with thigh-high  TED hose and abdominal binders, remains symptomatic.  P started on midodrine yesterday, he is significantly hypertensive overnight so  it has been discontinued.  P- -today's first day where he is not orthostatic, blood pressure remains elevated, so as discussed with renal will monitor for another 24 hours.   -Likely diabetic neuropathy contributing to his orthostasis.  New diagnosis of diabetic gastroparesis causing nausea  vomiting: - Chronic intermittent persistent nausea and nausea vomiting, work-up here suggest gastroparesis on 07/17/2022, placed on Premeal Reglan along with supportive care for nausea with much improvement .  ESRD on HD TTS, seen by nephrology, on TTS schedule, to receive hemodialysis today.  Anemia of ESRD ESA started recently hemoglobin is stable-much intermittently  Metabolic bone disease from CKD calcium and phosphate elevated continue PO VRDA and Renvela per nephrology  DM2 with DKA - DKA resolved, currently on Lantus and sliding scale monitor and adjust, diabetic and insulin education, continue with Semglee  Shingles -Started on Valtrex, renally dosed.   Lab Results  Component Value Date   HGBA1C 11.2 (H) 07/11/2022   CBG (last 3)  Recent Labs    07/24/22 2106 07/25/22 0813 07/25/22 1234  GLUCAP 171* 177* 179*     DVT prophylaxis: Place TED hose Start: 07/16/22 0607 heparin injection 5,000 Units Start: 07/11/22 1400 SCDs Start: 07/11/22 0425 Code Status:   Code Status: Full Code Family Communication: None at bedside Patient status is: Inpatient because of orthostatic hypotension symptomatic Level of care: Telemetry Medical   Dispo: The patient is from: home.  Current activity level 3> stand with assist balance while standing and cannot march in place  Anticipated disposition: home  Objective: Vitals last 24 hrs: Vitals:   07/25/22 0000 07/25/22 0230 07/25/22 0617 07/25/22 0817  BP: (!) 211/94 134/84    Pulse:  (!) 104    Resp: 16 18    Temp:  98.8 F (37.1 C)    TempSrc:  Oral    SpO2:    96%  Weight:   73.3 kg    Weight change: 1.101 kg  Physical Examination:   Awake Alert, Oriented X 3, No  new F.N deficits, Normal affect Symmetrical Chest wall movement, Good air movement bilaterally, CTAB RRR,No Gallops,Rubs or new Murmurs, No Parasternal Heave +ve B.Sounds, Abd Soft, No tenderness, No rebound - guarding or rigidity. No Cyanosis, Clubbing or  edema, No new Rash or bruise        Medications reviewed:  Scheduled Meds:  aspirin EC  81 mg Oral q morning   atorvastatin  80 mg Oral Daily   bisacodyl  10 mg Rectal Daily   calcitRIOL  0.5 mcg Oral Q T,Th,Sa-HD   Chlorhexidine Gluconate Cloth  6 each Topical Q0600   Chlorhexidine Gluconate Cloth  6 each Topical Q0600   clopidogrel  75 mg Oral q morning   darbepoetin (ARANESP) injection - DIALYSIS  100 mcg Intravenous Q Thu-HD   docusate sodium  200 mg Oral BID   ferric citrate  420 mg Oral TID WC   heparin  5,000 Units Subcutaneous Q8H   insulin aspart  0-9 Units Subcutaneous TID WC   insulin glargine-yfgn  20 Units Subcutaneous Daily   metoCLOPramide  10 mg Oral TID AC & HS   pantoprazole  40 mg Oral BID   polyethylene glycol  17 g Oral BID   sertraline  50 mg Oral Daily   valACYclovir  500 mg Oral Q24H   Continuous Infusions:  sodium chloride Stopped (07/20/22 1830)   anticoagulant sodium citrate        Diet Order             Diet Carb Modified Fluid consistency: Thin; Room service appropriate? Yes  Diet effective now           Diet Carb Modified                  Labs  Recent Labs  Lab 07/19/22 0243 07/20/22 0231 07/20/22 1803 07/20/22 2323 07/22/22 0432 07/22/22 2108  WBC 6.5 7.4 7.2 6.6 6.5 7.7  HGB 10.3* 9.8* 10.0* 10.0* 9.6* 10.3*  HCT 31.8* 29.6* 29.5* 29.5* 28.7* 30.6*  PLT 248 248 259 255 239 264  MCV 88.6 88.4 86.8 87.3 87.2 86.2  MCH 28.7 29.3 29.4 29.6 29.2 29.0  MCHC 32.4 33.1 33.9 33.9 33.4 33.7  RDW 15.7* 15.7* 15.3 15.4 15.3 15.0  LYMPHSABS 2.4 2.5  --  2.3 2.4  --   MONOABS 0.7 0.5  --  0.5 0.6  --   EOSABS 0.0 0.0  --  0.1 0.1  --   BASOSABS 0.0 0.1  --  0.0 0.0  --     Recent Labs  Lab 07/19/22 0243 07/20/22 0231 07/20/22 2323 07/22/22 0432 07/25/22 0619  NA 140 138 138 134* 137  K 3.8 4.1 4.1 4.4 4.7  CL 99 98 99 99 98  CO2 30 30 29 25 30   GLUCOSE 185* 222* 99 255* 151*  BUN 27* 40* 36* 47* 31*  CREATININE 5.13*  6.83* 5.83* 6.84* 6.02*  CALCIUM 9.3 9.2 9.1 9.0 9.9  AST 19 21 21  13*  --   ALT 15 17 15 12   --   ALKPHOS 61 60 62 56  --   BILITOT 0.6 0.5 0.7 0.6  --   ALBUMIN 3.1* 3.1* 3.3* 3.1* 3.4*  PHOS  --   --  2.5  --  1.5*  CRP <0.5 <0.5 <0.5  --   --     Other culture-see note  Radiology Studies: No results found.  Signature  Phillips Climes M.D on 07/25/2022 at 2:55 PM   -  To page go to [email or web address]

## 2022-07-25 NOTE — Progress Notes (Signed)
Louis Peters Progress Note   Subjective:    Last HD on 10/21 with 1 kg UF.  Spoke with his team.  BP shot up to 268 systolic.  RN held midodrine.  He got NS 500 mL on 10/23.  He states he got up and down about 10 times yesterday for trips the restroom.  Still urinates.  Feels well - no dizziness.    Review of systems:   Denies shortness of breath or chest pain  Denies n/v/d  Objective Vitals:   07/25/22 0000 07/25/22 0230 07/25/22 0617 07/25/22 0817  BP: (!) 211/94 134/84    Pulse:  (!) 104    Resp: 16 18    Temp:  98.8 F (37.1 C)    TempSrc:  Oral    SpO2:    96%  Weight:   73.3 kg    Physical Exam    General adult male in bed in no acute distress HEENT normocephalic atraumatic extraocular movements intact sclera anicteric Neck supple trachea midline Lungs clear to auscultation bilaterally normal work of breathing at rest  Heart S1S2 no rub Abdomen soft nontender nondistended Extremities no edema  Psych normal mood and affect Access: RUE AVF bruit and thrill   Filed Weights   07/22/22 1932 07/24/22 0623 07/25/22 0617  Weight: 73.6 kg 72.2 kg 73.3 kg    Intake/Output Summary (Last 24 hours) at 07/25/2022 1133 Last data filed at 07/24/2022 2200 Gross per 24 hour  Intake 485.59 ml  Output 500 ml  Net -14.41 ml    Additional Objective Labs: Basic Metabolic Panel: Recent Labs  Lab 07/20/22 2323 07/22/22 0432 07/25/22 0619  NA 138 134* 137  K 4.1 4.4 4.7  CL 99 99 98  CO2 29 25 30   GLUCOSE 99 255* 151*  BUN 36* 47* 31*  CREATININE 5.83* 6.84* 6.02*  CALCIUM 9.1 9.0 9.9  PHOS 2.5  --  1.5*   Liver Function Tests: Recent Labs  Lab 07/20/22 0231 07/20/22 2323 07/22/22 0432 07/25/22 0619  AST 21 21 13*  --   ALT 17 15 12   --   ALKPHOS 60 62 56  --   BILITOT 0.5 0.7 0.6  --   PROT 5.8* 6.1* 5.6*  --   ALBUMIN 3.1* 3.3* 3.1* 3.4*   No results for input(s): "LIPASE", "AMYLASE" in the last 168 hours.  CBC: Recent Labs  Lab  07/20/22 0231 07/20/22 1803 07/20/22 2323 07/22/22 0432 07/22/22 2108  WBC 7.4 7.2 6.6 6.5 7.7  NEUTROABS 4.2  --  3.7 3.4  --   HGB 9.8* 10.0* 10.0* 9.6* 10.3*  HCT 29.6* 29.5* 29.5* 28.7* 30.6*  MCV 88.4 86.8 87.3 87.2 86.2  PLT 248 259 255 239 264   Blood Culture    Component Value Date/Time   SDES  01/02/2021 1210    BLOOD LEFT HAND Performed at [facility], [address] [place], [place] [address]    [facility]  01/02/2021 1210    BOTTLES DRAWN AEROBIC AND ANAEROBIC Blood Culture adequate volume Performed at [facility], [address] [place], [place] [address]    CULT  01/02/2021 1210    NO GROWTH 5 DAYS Performed at [facility], [place] [address], [place], [place] [address]    REPTSTATUS 01/07/2021 FINAL 01/02/2021 1210    Cardiac Enzymes: No results for input(s): "CKTOTAL", "CKMB", "CKMBINDEX", "TROPONINI" in the last 168 hours. CBG: Recent Labs  Lab 07/24/22 0737 07/24/22 1204 07/24/22  1641 07/24/22 2106 07/25/22 0813  GLUCAP 254* 170* 196* 171* 177*   Iron Studies: No results for input(s): "IRON", "TIBC", "TRANSFERRIN", "FERRITIN" in the last 72 hours. Lab Results  Component Value Date   INR 0.9 07/22/2020   Studies/Results: No results found.  Medications:  sodium chloride Stopped (07/20/22 1830)   anticoagulant sodium citrate      aspirin EC  81 mg Oral q morning   atorvastatin  80 mg Oral Daily   bisacodyl  10 mg Rectal Daily   calcitRIOL  0.5 mcg Oral Q T,Th,Sa-HD   Chlorhexidine Gluconate Cloth  6 each Topical Q0600   Chlorhexidine Gluconate Cloth  6 each Topical Q0600   clopidogrel  75 mg Oral q morning   darbepoetin (ARANESP) injection - DIALYSIS  100 mcg Intravenous Q Thu-HD   docusate sodium  200 mg Oral BID   ferric citrate  420 mg Oral TID WC   heparin  5,000 Units Subcutaneous Q8H   insulin aspart  0-9 Units Subcutaneous TID WC   insulin glargine-yfgn  20 Units Subcutaneous Daily   metoCLOPramide   10 mg Oral TID AC & HS   midodrine  5 mg Oral TID WC   pantoprazole  40 mg Oral BID   polyethylene glycol  17 g Oral BID   sertraline  50 mg Oral Daily   valACYclovir  500 mg Oral Q24H    Dialysis Orders: TTS [facility] 4h  400/1.5  75kg  2/2 bath  Hep none RUA AVF - last HD 10/7, post wt 76.1 - last Hb 10.4 on 10/5 - mircera 120 ug q 4 wks, last  9/14 > due 10/12 thurs - venofer 50 mg weekly - calcitriol 0.5 ug tiw po tts   Home meds include - amlodipine 10, aspirin, atovastatin, clopidogrel, humalog insulin/ degludec, hydralazine 50 tid, hctz 25 qd, labetalol 200 bid, pantoprazole, sevelamer carb 800 tid, prns/ vits/ supps   Assessment/Plan: ESRD - HD TTS.  no UF with HD today.  Shingles - new Dx.  Reporting minimal pain.  Will need precautions w/HD. Renally dosed medications.  Nausea/vomiting - new Dx gastroparesis. CT abdomen +constipation, no acute abnormalities. Per PMD. Now on reglan and antiemetics. improved Orthostatic hypotension/ volume- noted near syncopal episodes, BP meds adjusted: amlodipine, hydralazine and labetalol discontinued.  He has intermittently gotten fluids.  Have removed fluid restrictions from diet.  Stop midodrine (primary agrees).  Follow BP trends - may need a low dose of amlodipine  DKA/ DM1 - BS 1061 at admit./ continue SSI.  Per PMD Anemia esrd ESA recently started here.  Aranesp 100 q. Thursday   MBD ckd -continue calcitriol.  note that he was changed to [place]  Disposition - per primary team. Spoke with team and they are considering home tomorrow - agree with holding here today off the midodrine and watching with HD.   Claudia Desanctis [employer] 07/25/2022,11:52 AM

## 2022-07-25 NOTE — Plan of Care (Signed)

## 2022-07-26 LAB — GLUCOSE, CAPILLARY
Glucose-Capillary: 174 mg/dL — ABNORMAL HIGH (ref 70–99)
Glucose-Capillary: 147 mg/dL — ABNORMAL HIGH (ref 70–99)
Glucose-Capillary: 223 mg/dL — ABNORMAL HIGH (ref 70–99)
Glucose-Capillary: 151 mg/dL — ABNORMAL HIGH (ref 70–99)

## 2022-07-26 MED ORDER — CALCITRIOL 0.5 MCG PO CAPS: 0.5000 ug | ORAL_CAPSULE | ORAL | 0 refills | Status: DC

## 2022-07-26 MED ORDER — INSULIN DEGLUDEC 100 UNIT/ML ~~LOC~~ SOPN: 15.0000 [IU] | PEN_INJECTOR | Freq: Every day | SUBCUTANEOUS | 0 refills | Status: DC

## 2022-07-26 MED ORDER — SODIUM CHLORIDE 0.9 % IV SOLN: INTRAVENOUS | Status: AC

## 2022-07-26 MED ORDER — SODIUM CHLORIDE 0.9 % IV BOLUS
500.0000 mL | Freq: Once | INTRAVENOUS | Status: AC
  Administered 2022-07-26: 500 mL via INTRAVENOUS

## 2022-07-26 NOTE — Progress Notes (Signed)
Physical Therapy Treatment Patient Details Name: Louis Peters MRN: [id number] DOB: 06/24/1966 Today's Date: 07/26/2022   History of Present Illness Pt is a 56 y/o male admitted secondary to hyperglycemia. Course complicated by persistent hypotension. Pt with persistent N/V, workup for gastroparesis 10/16. Of note, pt with shingles on R hip. PMH includes ESRD (HD TTS), T1DM.    PT Comments    Pt continues to be limited by significant and symptomatic orthostatic hypotension, even with taking his time at each positional change, as well as use of thigh high ted hose and abd binder. Pt's team is aware of continued orthostatic hypotension during mobility. Will continue to follow.    BP and HR:  Supine: 183/108, 98 Sitting: 145/89, 90 Standing 0 min: 81/64, 95 Standing 3 min: 66/51, 85    Recommendations for follow up therapy are one component of a multi-disciplinary discharge planning process, led by the attending physician.  Recommendations may be updated based on patient status, additional functional criteria and insurance authorization.  Follow Up Recommendations  No PT follow up     Assistance Recommended at Discharge Intermittent Supervision/Assistance  Patient can return home with the following A little help with bathing/dressing/bathroom;Help with stairs or ramp for entrance;Assist for transportation;Assistance with cooking/housework   Equipment Recommendations  None recommended by PT    Recommendations for Other Services       Precautions / Restrictions Precautions Precautions: Fall;Other (comment) Precaution Comments: +orthostatics Restrictions Weight Bearing Restrictions: No     Mobility  Bed Mobility Overal bed mobility: Needs Assistance Bed Mobility: Supine to Sit, Sit to Supine     Supine to sit: Supervision Sit to supine: Min assist   General bed mobility comments: assist for lift LEs back into bed    Transfers Overall transfer level: Needs  assistance Equipment used: None Transfers: Sit to/from Stand Sit to Stand: Supervision           General transfer comment: supervision for safety    Ambulation/Gait Ambulation/Gait assistance: Min guard Gait Distance (Feet): 80 Feet Assistive device: None Gait Pattern/deviations: Step-through pattern, Decreased stride length Gait velocity: decr     General Gait Details: short distance laps in room near bed in case of need to sit down for near-syncope, pt asymptomatic for duration of gait but once stopped pt became lightheaded, increased response times, unsteadiness, needed to reeturn to supine.   Stairs             Wheelchair Mobility    Modified [name] (Stroke Patients Only)       Balance Overall balance assessment: Needs assistance Sitting-balance support: No upper extremity supported, Feet supported Sitting balance-Leahy Scale: Good     Standing balance support: No upper extremity supported Standing balance-Leahy Scale: Fair                              Cognition Arousal/Alertness: Awake/alert Behavior During Therapy: WFL for tasks assessed/performed Overall Cognitive Status: No family/caregiver present to determine baseline cognitive functioning                                 General Comments: Pt denies symptoms of orthostatic hypotension with obviously dizzy        Exercises      General Comments General comments (skin integrity, edema, etc.): use of ted hose and abd binder      Pertinent Vitals/Pain Pain Assessment  Pain Assessment: Faces Faces Pain Scale: No hurt    Home Living                          Prior Function            PT Goals (current goals can now be found in the care plan section) Acute Rehab PT Goals Patient Stated Goal: home PT Goal Formulation: With patient Time For Goal Achievement: 08/09/22 Potential to Achieve Goals: Fair Progress towards PT goals: Not progressing toward  goals - comment (orthostatic hypotension)    Frequency    Min 3X/week      PT Plan Current plan remains appropriate    Co-evaluation              AM-PAC PT "6 Clicks" Mobility   Outcome Measure  Help needed turning from your back to your side while in a flat bed without using bedrails?: A Little Help needed moving from lying on your back to sitting on the side of a flat bed without using bedrails?: A Little Help needed moving to and from a bed to a chair (including a wheelchair)?: A Little Help needed standing up from a chair using your arms (e.g., wheelchair or bedside chair)?: A Little Help needed to walk in hospital room?: A Little Help needed climbing 3-5 steps with a railing? : A Lot 6 Click Score: 17    End of Session Equipment Utilized During Treatment: Other (comment) (ted hose, abd binder) Activity Tolerance: Treatment limited secondary to medical complications (Comment) (orthostatic hypotension) Patient left: in bed;with call bell/phone within reach Nurse Communication: Mobility status PT Visit Diagnosis: Unsteadiness on feet (R26.81);Muscle weakness (generalized) (M62.81)     Time: 6553-7482 PT Time Calculation (min) (ACUTE ONLY): 21 min  Charges:  $Therapeutic Activity: 8-22 mins                     Stacie Glaze, PT DPT Acute Rehabilitation Services Pager [phone]  Office [phone]   Roxine Caddy E Ruffin Pyo 07/26/2022, 2:35 PM

## 2022-07-26 NOTE — Progress Notes (Signed)
After discharge, called by Nephrology that they were present in the room and patient stood with PT and BP dropped to 81/64 and patient became dizzy, nearly passed out.     In terms of cause of orthostasis: I have low suspicion for a neurodegenerative disease at his age We have tried to eliminated medications that would cause orthostasis He has no prior cancer, so I have low suspicion for paraneoplastic orthostasis. He obviously has this new gastroparesis, and I would assume his orthostasis is the result of diabetic autonomic neuropathy.  Given the failure of midodrine, and the fact that he is on ESRD (and so Florinef likely not effective) we are somewhat limited in therapeutic options.  Nephrology will give fluids today.  Discharge canceled.

## 2022-07-26 NOTE — Progress Notes (Signed)
[facility] Progress Note   Subjective:    Last HD on 10/24 with no UF. He had felt well previously but just had orthostatics with therapy and dropped to 80 systolic once and then to the 60's with standing.  Felt poorly and pre-syncopal.  Spoke with RN and primary team.    Review of systems:    Denies shortness of breath or chest pain  Denies n/v/d  Objective Vitals:   07/25/22 1852 07/25/22 2013 07/26/22 0009 07/26/22 0813  BP:  132/71 (!) 179/84 (!) 173/83  Pulse:  80 75 83  Resp:  16 16 17   Temp:  98.1 F (36.7 C) 98 F (36.7 C) 98.2 F (36.8 C)  TempSrc:  Oral Oral Oral  SpO2:    100%  Weight: 75.2 kg      Physical Exam     General adult male in bed in no acute distress HEENT normocephalic atraumatic extraocular movements intact sclera anicteric Neck supple trachea midline Lungs clear to auscultation bilaterally normal work of breathing at rest  Heart S1S2 no rub Abdomen soft nontender nondistended Extremities no edema  Psych normal mood and affect Access: RUE AVF bruit and thrill   Filed Weights   07/25/22 0617 07/25/22 1519 07/25/22 1852  Weight: 73.3 kg 75 kg 75.2 kg    Intake/Output Summary (Last 24 hours) at 07/26/2022 1147 Last data filed at 07/26/2022 1100 Gross per 24 hour  Intake 480 ml  Output 400 ml  Net 80 ml    Additional Objective Labs: Basic Metabolic Panel: Recent Labs  Lab 07/20/22 2323 07/22/22 0432 07/25/22 0619  NA 138 134* 137  K 4.1 4.4 4.7  CL 99 99 98  CO2 29 25 30   GLUCOSE 99 255* 151*  BUN 36* 47* 31*  CREATININE 5.83* 6.84* 6.02*  CALCIUM 9.1 9.0 9.9  PHOS 2.5  --  1.5*   Liver Function Tests: Recent Labs  Lab 07/20/22 0231 07/20/22 2323 07/22/22 0432 07/25/22 0619  AST 21 21 13*  --   ALT 17 15 12   --   ALKPHOS 60 62 56  --   BILITOT 0.5 0.7 0.6  --   PROT 5.8* 6.1* 5.6*  --   ALBUMIN 3.1* 3.3* 3.1* 3.4*   No results for input(s): "LIPASE", "AMYLASE" in the last 168 hours.  CBC: Recent  Labs  Lab 07/20/22 0231 07/20/22 1803 07/20/22 2323 07/22/22 0432 07/22/22 2108 07/25/22 1456  WBC 7.4 7.2 6.6 6.5 7.7 7.8  NEUTROABS 4.2  --  3.7 3.4  --   --   HGB 9.8* 10.0* 10.0* 9.6* 10.3* 8.6*  HCT 29.6* 29.5* 29.5* 28.7* 30.6* 26.2*  MCV 88.4 86.8 87.3 87.2 86.2 89.7  PLT 248 259 255 239 264 294   Blood Culture    Component Value Date/Time   SDES  01/02/2021 1210    BLOOD LEFT HAND Performed at [facility], [address], [place], [place] [address]    [facility]  01/02/2021 1210    BOTTLES DRAWN AEROBIC AND ANAEROBIC Blood Culture adequate volume Performed at [facility], [address], [place], [place] [address]    CULT  01/02/2021 1210    NO GROWTH 5 DAYS Performed at [facility], [place] [address], [place], [place] [address]    REPTSTATUS 01/07/2021 FINAL 01/02/2021 1210    Cardiac Enzymes: No results for input(s): "CKTOTAL", "CKMB", "CKMBINDEX", "TROPONINI" in the last 168 hours. CBG: Recent Labs  Lab 07/25/22  1610 07/25/22 1234 07/25/22 1724 07/25/22 2119 07/26/22 0810  GLUCAP 177* 179* 131* 135* 174*   Iron Studies: No results for input(s): "IRON", "TIBC", "TRANSFERRIN", "FERRITIN" in the last 72 hours. Lab Results  Component Value Date   INR 0.9 07/22/2020   Studies/Results: No results found.  Medications:  sodium chloride Stopped (07/20/22 1830)   anticoagulant sodium citrate      aspirin EC  81 mg Oral q morning   atorvastatin  80 mg Oral Daily   bisacodyl  10 mg Rectal Daily   calcitRIOL  0.5 mcg Oral Q T,Th,Sa-HD   Chlorhexidine Gluconate Cloth  6 each Topical Q0600   Chlorhexidine Gluconate Cloth  6 each Topical Q0600   clopidogrel  75 mg Oral q morning   darbepoetin (ARANESP) injection - DIALYSIS  100 mcg Intravenous Q Thu-HD   docusate sodium  200 mg Oral BID   ferric citrate  420 mg Oral TID WC   heparin  5,000 Units Subcutaneous Q8H   insulin aspart  0-9 Units Subcutaneous TID WC   insulin  glargine-yfgn  20 Units Subcutaneous Daily   metoCLOPramide  5 mg Oral TID AC & HS   pantoprazole  40 mg Oral BID   polyethylene glycol  17 g Oral BID   sertraline  50 mg Oral Daily   valACYclovir  500 mg Oral Q24H    Dialysis Orders: TTS [facility] 4h  400/1.5  75kg  2/2 bath  Hep none RUA AVF - last HD 10/7, post wt 76.1 - last Hb 10.4 on 10/5 - mircera 120 ug q 4 wks, last  9/14 > due 10/12 thurs - venofer 50 mg weekly - calcitriol 0.5 ug tiw po tts   Home meds include - amlodipine 10, aspirin, atovastatin, clopidogrel, humalog insulin/ degludec, hydralazine 50 tid, hctz 25 qd, labetalol 200 bid, pantoprazole, sevelamer carb 800 tid, prns/ vits/ supps   Assessment/Plan: ESRD - HD TTS.  had been below EDW.  Raise EDW Shingles - new Dx.  Reporting minimal pain.  Will need precautions w/HD. Renally dosed medications.  Nausea/vomiting - new Dx gastroparesis. CT abdomen +constipation, no acute abnormalities. Per PMD. Now on reglan and antiemetics. improved Orthostatic hypotension/ volume- noted near syncopal episodes, BP meds adjusted: amlodipine, hydralazine and labetalol discontinued.  He has intermittently gotten fluids.  Have removed fluid restrictions from diet.  Midodrine was stopped as pressures were at 960 systolic.  Raise EDW by 1 kg.  Will give 500 mL NS once now and then NS at 75 ml/hr x 6 hours DKA/ DM1 - BS 1061 at admit./ continue SSI.  Per PMD Anemia esrd ESA recently started here.  Aranesp 100 q. Thursday   MBD ckd -continue calcitriol.  note that he was changed to [place]  Disposition - would continue inpatient monitoring.  I spoke with the primary team and they are cancelling his discharge  [place] 07/26/2022,12:21 PM

## 2022-07-26 NOTE — Discharge Summary (Signed)
Physician Discharge Summary   Patient: Louis Peters MRN: [id number] DOB: [birth year]  Admit date:     07/10/2022  Discharge date: 07/26/22  Discharge Physician: Edwin Dada   PCP: Bernerd Limbo, MD     Recommendations at discharge:  Follow up with PCP Dr. Coletta Memos in 1 week Dr. Coletta Memos: Please follow up BP medications/orthostasis Please follow up new sertraline  Antihypertensives held at discharge due to orthostasis, recommend follow-up with PCP and nephrology Follow-up with endocrinology, Dr. 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 is a 56 year old M with ESRD on HD TTS, DM type I, HTN, and HLD who presented with DKA.     Diabetic ketoacidosis Admitted in treated with insulin drip, resolved.  Resumed on insulin prior to discharge.  Recommend close endocrinology follow-up     Hypoglycemia After resolution of the patient's DKA, he had several episodes of hypoglycemia in the setting of vomiting.  This eventually resolved   Diabetic gastroparesis Patient observed here to have recurrent vomiting and epigastric pain.  Gastric emptying study obtained which was severely delayed  Orthostatic hypotension Patient's discharge was delayed for a prolonged period of time due to persistent symptomatic orthostasis.    He could not tolerate midodrine due to supine hypertension  Eventually he was treated with fluids, input from nephrology, and TED hose/abdominal binder and his symptoms resolved.  He remained orthostatic with vital signs, but was functioning well, eager for discharge, cleared by  PT.  Shingles Patient treated with 7 days of Valtrex, resolved.         The [facility] Controlled Substances Registry was reviewed for this patient prior to discharge.  Consultants: Nephrology Procedures performed: Gastric emptying study, CT abdomen and pelvis Disposition: Home Diet recommendation:  Discharge Diet Orders (From admission, onward)     Start     Ordered   07/12/22 0000  Diet Carb Modified        07/12/22 1233             DISCHARGE MEDICATION: Allergies as of 07/26/2022       Reactions   Fexofenadine Rash   Latex Rash   Chlorhexidine Gluconate Other (See Comments)   Blisters        Medication List     STOP taking these medications    amLODipine 10 MG tablet Commonly known as: NORVASC   hydrALAZINE 50 MG tablet Commonly known as: APRESOLINE   hydrochlorothiazide 25 MG tablet Commonly known as: HYDRODIURIL   labetalol 200 MG tablet Commonly known as: NORMODYNE       TAKE these medications    aspirin EC 81 MG tablet Take 1 tablet (81 mg total) by mouth daily. Swallow whole. What changed: when to take this   atorvastatin 80 MG tablet Commonly known as: LIPITOR Take 1 tablet (80 mg total) by mouth daily. What changed: when to take this   Baqsimi One Pack 3 MG/DOSE Powd Generic drug: Glucagon Place 3 mg into the nose once as needed (low blood sugar).   calcitRIOL 0.5 MCG capsule Commonly known as: ROCALTROL Take 1 capsule (0.5 mcg total) by  mouth Every Tuesday,Thursday,and Saturday with dialysis. Start taking on: July 27, 2022   clopidogrel 75 MG tablet Commonly known as: PLAVIX Take 75 mg by mouth every morning.   FreeStyle Libre 2 Reader [employer] 1 box Diagnosis: Diabetes mellitus type 1, IDDM What changed:  how to take this additional instructions   FreeStyle Libre 2 Sensor Misc Diagnosis: Diabetes mellitus type 1, IDDM What changed:  how to take this additional instructions   HumaLOG KwikPen 100 UNIT/ML  KwikPen Generic drug: insulin lispro Inject 5 Units into the skin 3 (three) times daily. What changed: Another medication with the same name was added. Make sure you understand how and when to take each.   insulin lispro 100 UNIT/ML KwikPen Commonly known as: HUMALOG CORRECTION FACTOR: Sliding scale CBG 70 - 120: 0 units CBG 121 - 150: 0 unit,  CBG 151 - 200: 1 units,  CBG 201 - 250: 2 units,  CBG 251 - 300: 2 units,  CBG 301 - 350: 4 units,  CBG 351 - 400: 5 units   CBG > 400: 6 units and notify your MD What changed: You were already taking a medication with the same name, and this prescription was added. Make sure you understand how and when to take each.   insulin degludec 100 UNIT/ML FlexTouch Pen Commonly known as: TRESIBA Inject 15 Units into the skin at bedtime. What changed: how much to take   Insulin Pen Needle 32G X 4 MM Misc Use with lantus and humalog 4 imes per day   ondansetron 4 MG disintegrating tablet Commonly known as: ZOFRAN-ODT Take 1 tablet (4 mg total) by mouth every 8 (eight) hours as needed for nausea or vomiting.   ONE TOUCH LANCETS Misc Use to check blood sugar 8 time(s) daily   OneTouch Verio Flex System w/Device Kit by Does not apply route.   OneTouch Verio test strip Generic drug: glucose blood SMARTSIG:Via Meter 8 Times Daily   pantoprazole 40 MG tablet Commonly known as: Protonix Take 1 tablet (40 mg total) by mouth daily before breakfast. What changed: when to take this   Retacrit 10000 UNIT/ML injection Generic drug: epoetin alfa-epbx Inject 10,000 Units into the vein every 30 (thirty) days.   sertraline 50 MG tablet Commonly known as: ZOLOFT Take 1 tablet (50 mg total) by mouth daily.   sevelamer carbonate 800 MG tablet Commonly known as: RENVELA Take 800 mg by mouth 3 (three) times daily.   Vitamin D 50 MCG (2000 UT) tablet Take 2,000 Units by mouth daily.        Follow-up Information     Bernerd Limbo, MD. Schedule an  appointment as soon as possible for a visit in 5 day(s).   Specialty: Family Medicine Why: for hospital follow-up Contact information: [place] [address] [phone]         Gerome Apley, MD. Schedule an appointment as soon as possible for a visit in 10 day(s).   Specialty: Endocrinology Why: for hospital follow-up for diabetes Contact information: [place] [address] [phone]                 Discharge Instructions     Diet Carb Modified   Complete by: As directed    Discharge instructions   Complete by: As directed    It is VERY IMPORTANT that you follow up with a PCP/endocrinologist on a regular basis.  Check your blood glucoses before each meal  and at bedtime and maintain a log of your readings.  Bring this log with you when you follow up with your doctor so that he or she can adjust your insulin at your follow up visit.   Increase activity slowly   Complete by: As directed    No wound care   Complete by: As directed        Discharge Exam: Filed Weights   07/25/22 0617 07/25/22 1519 07/25/22 1852  Weight: 73.3 kg 75 kg 75.2 kg    General: Pt is alert, awake, not in acute distress Cardiovascular: RRR, nl S1-S2, no murmurs appreciated.   No LE edema.   Respiratory: Normal respiratory rate and rhythm.  CTAB without rales or wheezes. Abdominal: Abdomen soft and non-tender.  No distension or HSM.   Neuro/Psych: Strength symmetric in upper and lower extremities.  Judgment and insight appear normal.   Condition at discharge: stable  The results of significant diagnostics from this hospitalization (including imaging, microbiology, ancillary and laboratory) are listed below for reference.   Imaging Studies: NM GASTRIC EMPTYING  Result Date: 07/17/2022 CLINICAL DATA:  Diabetes and end-stage renal disease. Nausea and vomiting with epigastric abdominal pain. EXAM: NUCLEAR MEDICINE  GASTRIC EMPTYING SCAN TECHNIQUE: After oral ingestion of radiolabeled meal, sequential abdominal images were obtained for 4 hours. Percentage of activity emptying the stomach was calculated at 1 hour, 2 hour, 3 hour, and 4 hours. RADIOPHARMACEUTICALS:  2.0 mCi Tc-18min standardized meal consisting of scrambled eggs and water COMPARISON:  07/17/2022 CT scan FINDINGS: Expected location of the stomach in the left upper quadrant. Ingested meal empties the stomach poorly over the course of the study. 10 emptied at 1 hr ( normal >= 10%) 10 emptied at 2 hr ( normal >= 40%) 11 emptied at 3 hr ( normal >= 70%) 11 emptied at 4 hr ( normal >= 90%) IMPRESSION: Substantially delayed gastric emptying with only 11% emptying at 4 hours. Electronically Signed   By: WVan ClinesM.D.   On: 07/17/2022 17:34   CT ABDOMEN PELVIS WO CONTRAST  Result Date: 07/17/2022 CLINICAL DATA:  Abdominal pain. Nausea and vomiting. Epigastric pain. EXAM: CT ABDOMEN AND PELVIS WITHOUT CONTRAST TECHNIQUE: Multidetector CT imaging of the abdomen and pelvis was performed following the standard protocol without IV contrast. RADIATION DOSE REDUCTION: This exam was performed according to the departmental dose-optimization program which includes automated exposure control, adjustment of the mA and/or kV according to patient size and/or use of iterative reconstruction technique. COMPARISON:  04/12/2021 FINDINGS: Lower chest: No acute abnormality. Hepatobiliary: No focal liver abnormality is seen. No gallstones, gallbladder wall thickening, or biliary dilatation. Pancreas: Unremarkable. No pancreatic ductal dilatation or surrounding inflammatory changes. Spleen: Normal in size without focal abnormality. Adrenals/Urinary Tract: Adrenal glands are unremarkable. Kidneys are normal, without renal calculi, focal lesion, or hydronephrosis. Bladder is unremarkable. Stomach/Bowel: Stomach is within normal limits. No evidence of bowel wall thickening,  distention, or inflammatory changes. Moderate amount of stool throughout the colon. Small hiatal hernia. Vascular/Lymphatic: Normal caliber abdominal aorta with mild atherosclerosis. No lymphadenopathy. Reproductive: Prostate is unremarkable. Other: No abdominal wall hernia or abnormality. No abdominopelvic ascites. Focal areas of soft tissue nodularity and edema in the subcutaneous fat of the anterior abdominal wall likely reflecting sequela of subcutaneous injections. Musculoskeletal: No acute osseous abnormality. No aggressive osseous lesion. Chronic T12 vertebral body compression fracture. IMPRESSION: 1. No acute abdominal or pelvic pathology. 2. Moderate amount of stool throughout the colon. 3.  Aortic Atherosclerosis (ICD10-I70.0). Electronically Signed  By: Kathreen Devoid M.D.   On: 07/17/2022 08:56   DG Abd Portable 1V  Result Date: 07/16/2022 CLINICAL DATA:  Nausea. EXAM: PORTABLE ABDOMEN - 1 VIEW COMPARISON:  None Available. FINDINGS: The bowel gas pattern is normal. A small to moderate amount of stool within the colon and rectum noted. No radio-opaque calculi or other significant radiographic abnormality are seen. IMPRESSION: 1. No acute abnormalities. 2. Small to moderate amount of colonic/rectal stool. Electronically Signed   By: Margarette Canada M.D.   On: 07/16/2022 12:14   DG Chest 1 View  Result Date: 07/10/2022 CLINICAL DATA:  Hyperglycemia.  Vomiting since yesterday. EXAM: CHEST  1 VIEW COMPARISON:  06/05/2022 FINDINGS: Normal heart size and pulmonary vascularity. No focal airspace disease or consolidation in the lungs. No blunting of costophrenic angles. No pneumothorax. Mediastinal contours appear intact. Calcification of the aorta. Old left rib fractures. IMPRESSION: No active disease. Electronically Signed   By: [name] M.D.   On: 07/10/2022 23:20    Microbiology: Results for orders placed or performed during the hospital encounter of 06/05/22  Resp Panel by RT-PCR (Flu A&B,  Covid) Anterior Nasal Swab     Status: None   Collection Time: 06/05/22 10:08 AM   Specimen: Anterior Nasal Swab  Result Value Ref Range Status   SARS Coronavirus 2 by RT PCR NEGATIVE NEGATIVE Final    Comment: (NOTE) SARS-CoV-2 target nucleic acids are NOT DETECTED.  The SARS-CoV-2 RNA is generally detectable in upper respiratory specimens during the acute phase of infection. The lowest concentration of SARS-CoV-2 viral copies this assay can detect is 138 copies/mL. A negative result does not preclude SARS-Cov-2 infection and should not be used as the sole basis for treatment or other patient management decisions. A negative result may occur with  improper specimen collection/handling, submission of specimen other than nasopharyngeal swab, presence of viral mutation(s) within the areas targeted by this assay, and inadequate number of viral copies(<138 copies/mL). A negative result must be combined with clinical observations, patient history, and epidemiological information. The expected result is Negative.  Fact Sheet for Patients:  EntrepreneurPulse.com.au  Fact Sheet for Healthcare Providers:  IncredibleEmployment.be  This test is no t yet approved or cleared by the [place] FDA and  has been authorized for detection and/or diagnosis of SARS-CoV-2 by FDA under an Emergency Use Authorization (EUA). This EUA will remain  in effect (meaning this test can be used) for the duration of the COVID-19 declaration under Section 564(b)(1) of the Act, 21 U.S.C.section 360bbb-3(b)(1), unless the authorization is terminated  or revoked sooner.       Influenza A by PCR NEGATIVE NEGATIVE Final   Influenza B by PCR NEGATIVE NEGATIVE Final    Comment: (NOTE) The Xpert Xpress SARS-CoV-2/FLU/RSV plus assay is intended as an aid in the diagnosis of influenza from Nasopharyngeal swab specimens and should not be used as a sole basis for treatment. Nasal  washings and aspirates are unacceptable for Xpert Xpress SARS-CoV-2/FLU/RSV testing.  Fact Sheet for Patients: EntrepreneurPulse.com.au  Fact Sheet for Healthcare Providers: IncredibleEmployment.be  This test is not yet approved or cleared by the [place] FDA and has been authorized for detection and/or diagnosis of SARS-CoV-2 by FDA under an Emergency Use Authorization (EUA). This EUA will remain in effect (meaning this test can be used) for the duration of the COVID-19 declaration under Section 564(b)(1) of the Act, 21 U.S.C. section 360bbb-3(b)(1), unless the authorization is terminated or revoked.  Performed at [facility],  [address], [place], [place] [address]     Labs: CBC: Recent Labs  Lab 07/20/22 0231 07/20/22 1803 07/20/22 2323 07/22/22 0432 07/22/22 2108 07/25/22 1456  WBC 7.4 7.2 6.6 6.5 7.7 7.8  NEUTROABS 4.2  --  3.7 3.4  --   --   HGB 9.8* 10.0* 10.0* 9.6* 10.3* 8.6*  HCT 29.6* 29.5* 29.5* 28.7* 30.6* 26.2*  MCV 88.4 86.8 87.3 87.2 86.2 89.7  PLT 248 259 255 239 264 741   Basic Metabolic Panel: Recent Labs  Lab 07/20/22 0231 07/20/22 2323 07/22/22 0432 07/25/22 0619  NA 138 138 134* 137  K 4.1 4.1 4.4 4.7  CL 98 99 99 98  CO2 _0 GLUCOSE 222* 99 255* 151*  BUN 40* 36* 47* 31*  CREATININE 6.83* 5.83* 6.84* 6.02*  CALCIUM 9.2 9.1 9.0 9.9  PHOS  --  2.5  --  1.5*   Liver Function Tests: Recent Labs  Lab 07/20/22 0231 07/20/22 2323 07/22/22 0432 07/25/22 0619  AST 21 21 13*  --   ALT _1 --   ALKPHOS 60 62 56  --   BILITOT 0.5 0.7 0.6  --   PROT 5.8* 6.1* 5.6*  --   ALBUMIN 3.1* 3.3* 3.1* 3.4*   CBG: Recent Labs  Lab 07/25/22 0813 07/25/22 1234 07/25/22 1724 07/25/22 2119 07/26/22 0810  GLUCAP 177* 179* 131* 135* 174*    Discharge time spent: approximately 35 minutes spent on discharge counseling, evaluation of patient on day of discharge, and coordination of  discharge planning with nursing, social work, pharmacy and case management  Signed: Edwin Dada, MD [facility] 07/26/2022

## 2022-07-26 NOTE — Progress Notes (Signed)
   07/26/22 1300  Orthostatic Lying   BP- Lying (!) 183/108  Pulse- Lying 98  Orthostatic Sitting  BP- Sitting 145/89  Pulse- Sitting 90  Orthostatic Standing at 0 minutes  BP- Standing at 0 minutes (!) 81/64  Pulse- Standing at 0 minutes 95  Orthostatic Standing at 3 minutes  BP- Standing at 3 minutes (!) 66/51  Pulse- Standing at 3 minutes 85    Louis Peters S, PT DPT Acute Rehabilitation Services Pager [phone]  Office [phone]

## 2022-07-27 ENCOUNTER — Encounter (HOSPITAL_COMMUNITY): Payer: Self-pay | Admitting: Internal Medicine

## 2022-07-27 DIAGNOSIS — R739 Hyperglycemia, unspecified: Secondary | ICD-10-CM | POA: Diagnosis not present

## 2022-07-27 LAB — CBC
HCT: 26.6 % — ABNORMAL LOW (ref 39.0–52.0)
Hemoglobin: 8.3 g/dL — ABNORMAL LOW (ref 13.0–17.0)
MCH: 29 pg (ref 26.0–34.0)
MCHC: 31.2 g/dL (ref 30.0–36.0)
MCV: 93 fL (ref 80.0–100.0)
Platelets: 257 10*3/uL (ref 150–400)
RBC: 2.86 MIL/uL — ABNORMAL LOW (ref 4.22–5.81)
RDW: 17.2 % — ABNORMAL HIGH (ref 11.5–15.5)
WBC: 6.3 10*3/uL (ref 4.0–10.5)
nRBC: 0 % (ref 0.0–0.2)

## 2022-07-27 LAB — RENAL FUNCTION PANEL
Albumin: 2.8 g/dL — ABNORMAL LOW (ref 3.5–5.0)
Anion gap: 6 (ref 5–15)
BUN: 17 mg/dL (ref 6–20)
CO2: 25 mmol/L (ref 22–32)
Calcium: 7.8 mg/dL — ABNORMAL LOW (ref 8.9–10.3)
Chloride: 109 mmol/L (ref 98–111)
Creatinine, Ser: 4.13 mg/dL — ABNORMAL HIGH (ref 0.61–1.24)
GFR, Estimated: 16 mL/min — ABNORMAL LOW (ref >60)
Glucose, Bld: 48 mg/dL — ABNORMAL LOW (ref 70–99)
Phosphorus: 1.6 mg/dL — ABNORMAL LOW (ref 2.5–4.6)
Potassium: 3.3 mmol/L — ABNORMAL LOW (ref 3.5–5.1)
Sodium: 140 mmol/L (ref 135–145)

## 2022-07-27 LAB — GLUCOSE, CAPILLARY
Glucose-Capillary: 72 mg/dL (ref 70–99)
Glucose-Capillary: 237 mg/dL — ABNORMAL HIGH (ref 70–99)
Glucose-Capillary: 384 mg/dL — ABNORMAL HIGH (ref 70–99)

## 2022-07-27 MED ORDER — DARBEPOETIN ALFA 100 MCG/0.5ML IJ SOSY: 100.0000 ug | PREFILLED_SYRINGE | INTRAMUSCULAR | Status: DC

## 2022-07-27 MED ORDER — INSULIN GLARGINE-YFGN 100 UNIT/ML ~~LOC~~ SOLN
16.0000 [IU] | Freq: Every day | SUBCUTANEOUS | Status: DC
  Administered 2022-07-28 – 2022-07-30 (×3): 16 [IU] via SUBCUTANEOUS
  Filled 2022-07-27 (×3): qty 0.16

## 2022-07-27 MED ORDER — PENTAFLUOROPROP-TETRAFLUOROETH EX AERO
INHALATION_SPRAY | CUTANEOUS | Status: AC
  Filled 2022-07-27: qty 30

## 2022-07-27 MED ORDER — SODIUM CHLORIDE 0.9 % IV BOLUS
500.0000 mL | Freq: Once | INTRAVENOUS | Status: AC
  Administered 2022-07-27: 500 mL via INTRAVENOUS

## 2022-07-27 MED ORDER — SODIUM PHOSPHATES 45 MMOLE/15ML IV SOLN
15.0000 mmol | Freq: Once | INTRAVENOUS | Status: AC
  Administered 2022-07-27: 15 mmol via INTRAVENOUS
  Filled 2022-07-27: qty 5

## 2022-07-27 NOTE — Progress Notes (Addendum)
[facility] Progress Note   Subjective:    he had 400 mL uop over 10/25.  Last HD earlier today with no UF (per my order).  Got fluid bolus on 10/25 and fluids at 75 ml/hr x 6 hours additionally.  RN is about to do orthostatics.  He denies any dizziness.    Review of systems:    Denies shortness of breath or chest pain  Denies n/v/d  Objective Vitals:   07/27/22 1115 07/27/22 1131 07/27/22 1143 07/27/22 1252  BP: (!) 183/83 (!) 182/84    Pulse: 72 72    Resp: 11 (!) 24    Temp:   98.2 F (36.8 C)   TempSrc:   Oral   SpO2: 100% 100%    Weight:    75.4 kg   Physical Exam     General adult male in bed in no acute distress HEENT normocephalic atraumatic extraocular movements intact sclera anicteric Neck supple trachea midline Lungs clear to auscultation bilaterally normal work of breathing at rest  Heart S1S2 no rub Abdomen soft nontender nondistended Extremities no edema  Psych normal mood and affect Access: RUE AVF bruit and thrill   Filed Weights   07/25/22 1852 07/27/22 0819 07/27/22 1252  Weight: 75.2 kg 76 kg 75.4 kg    Intake/Output Summary (Last 24 hours) at 07/27/2022 1310 Last data filed at 07/27/2022 1143 Gross per 24 hour  Intake 671.89 ml  Output 0 ml  Net 671.89 ml    Additional Objective Labs: Basic Metabolic Panel: Recent Labs  Lab 07/20/22 2323 07/22/22 0432 07/25/22 0619 07/27/22 0817  NA 138 134* 137 140  K 4.1 4.4 4.7 3.3*  CL 99 99 98 109  CO2 29 25 30 25   GLUCOSE 99 255* 151* 48*  BUN 36* 47* 31* 17  CREATININE 5.83* 6.84* 6.02* 4.13*  CALCIUM 9.1 9.0 9.9 7.8*  PHOS 2.5  --  1.5* 1.6*   Liver Function Tests: Recent Labs  Lab 07/20/22 2323 07/22/22 0432 07/25/22 0619 07/27/22 0817  AST 21 13*  --   --   ALT 15 12  --   --   ALKPHOS 62 56  --   --   BILITOT 0.7 0.6  --   --   PROT 6.1* 5.6*  --   --   ALBUMIN 3.3* 3.1* 3.4* 2.8*   No results for input(s): "LIPASE", "AMYLASE" in the last 168  hours.  CBC: Recent Labs  Lab 07/20/22 2323 07/22/22 0432 07/22/22 2108 07/25/22 1456 07/27/22 0816  WBC 6.6 6.5 7.7 7.8 6.3  NEUTROABS 3.7 3.4  --   --   --   HGB 10.0* 9.6* 10.3* 8.6* 8.3*  HCT 29.5* 28.7* 30.6* 26.2* 26.6*  MCV 87.3 87.2 86.2 89.7 93.0  PLT 255 239 264 294 257   Blood Culture    Component Value Date/Time   SDES  01/02/2021 1210    BLOOD LEFT HAND Performed at [facility], [address], [place], [place] [address]    [facility]  01/02/2021 1210    BOTTLES DRAWN AEROBIC AND ANAEROBIC Blood Culture adequate volume Performed at [facility], [address] [place], [place] [address]    CULT  01/02/2021 1210    NO GROWTH 5 DAYS Performed at [facility], [place] [address] [place], [place] [address]    REPTSTATUS 01/07/2021 FINAL 01/02/2021 1210    Cardiac Enzymes: No results for input(s): "CKTOTAL", "CKMB", "CKMBINDEX", "TROPONINI" in  the last 168 hours. CBG: Recent Labs  Lab 07/26/22 0810 07/26/22 1240 07/26/22 1753 07/26/22 2141 07/27/22 1229  GLUCAP 174* 147* 223* 151* 72   Iron Studies: No results for input(s): "IRON", "TIBC", "TRANSFERRIN", "FERRITIN" in the last 72 hours. Lab Results  Component Value Date   INR 0.9 07/22/2020   Studies/Results: No results found.  Medications:  anticoagulant sodium citrate      aspirin EC  81 mg Oral q morning   atorvastatin  80 mg Oral Daily   bisacodyl  10 mg Rectal Daily   calcitRIOL  0.5 mcg Oral Q T,Th,Sa-HD   Chlorhexidine Gluconate Cloth  6 each Topical Q0600   Chlorhexidine Gluconate Cloth  6 each Topical Q0600   clopidogrel  75 mg Oral q morning   darbepoetin (ARANESP) injection - DIALYSIS  100 mcg Intravenous Q Thu-HD   docusate sodium  200 mg Oral BID   ferric citrate  420 mg Oral TID WC   heparin  5,000 Units Subcutaneous Q8H   insulin aspart  0-9 Units Subcutaneous TID WC   insulin glargine-yfgn  20 Units Subcutaneous Daily   metoCLOPramide  5 mg Oral  TID AC & HS   pantoprazole  40 mg Oral BID   pentafluoroprop-tetrafluoroeth       polyethylene glycol  17 g Oral BID   sertraline  50 mg Oral Daily   valACYclovir  500 mg Oral Q24H    Dialysis Orders: TTS [facility] 4h  400/1.5  75kg  2/2 bath  Hep none RUA AVF - last HD 10/7, post wt 76.1 - last Hb 10.4 on 10/5 - mircera 120 ug q 4 wks, last  9/14 > due 10/12 thurs - venofer 50 mg weekly - calcitriol 0.5 ug tiw po tts   Home meds include - amlodipine 10, aspirin, atovastatin, clopidogrel, humalog insulin/ degludec, hydralazine 50 tid, hctz 25 qd, labetalol 200 bid, pantoprazole, sevelamer carb 800 tid, prns/ vits/ supps   Assessment/Plan: ESRD - HD TTS.  had been below EDW.  Will need to raise EDW Shingles - new Dx.  Reporting minimal pain.  Will need precautions w/HD. Renally dosed medications.  Nausea/vomiting - new Dx gastroparesis. CT abdomen +constipation, no acute abnormalities. Per PMD. Now on reglan and antiemetics. improved Orthostatic hypotension/ volume- noted near syncopal episodes, BP meds stopped - amlodipine, hydralazine, HCTZ per charting, and labetalol discontinued.  he wears compression stockings and abdominal binder.  He has intermittently gotten fluids.  Have removed fluid restrictions from diet.  Midodrine was stopped as pressures were at 465 systolic.  Raise EDW by 1 kg.  It doesn't appear that he got fluids with HD - will order 500 mL once now   DKA/ DM1 - BS 1061 at admit./ continue SSI.  Per PMD Anemia esrd ESA recently started here.  Aranesp 100 q. Thursday.  Anticipate need to increase dose  MBD ckd -continue calcitriol.  hypophosphatemia - stop [place] (new med) and gentle repletion  Disposition - per primary team.  Looks better today and tolerated HD today.  Getting fluids as above    Claudia Desanctis [employer] 07/27/2022,1:31 PM    Per HD RN charting, it does appear that he got 500 mL with HD.  Can continue with fluids as above as team  considering discharge.  It is peculiar that his anti-hypertensive requirement decreased so drastically.  Now  with HTN.  Suspect we will need to continue to fine-tune his regimen as an outpatient.   Marlane Hatcher  Royce Macadamia, MD 1:32 PM 07/27/2022

## 2022-07-27 NOTE — Progress Notes (Signed)
PROGRESS NOTE Louis Peters  QMV:[id number] DOB: 09-11-1966 DOA: 07/10/2022 PCP: Bernerd Limbo, MD   Brief Narrative/Hospital Course: 56 year old male with ESRD on HD TTS, diabetes mellitus type 1, HTN, HLP presented with nausea vomiting and epigastric abdominal pain.  Patient is on long-acting insulin and 5 units with meals, reported that his blood sugars have been especially high over the last few days.  Patient reported that he developed progressively worsening nausea and vomiting in the last 24 hours, unable to hold anything down . Patient was admitted treated for DKA, transitioned to subcu insulin blood sugar stabilized on the having intermittent hypoglycemia further adjusted meds.  He had near-syncopal syncopal episode with orthostatic hypotension on 07/26/22- so discharge was held fluids were given    Subjective: Seen and examined this morning. Blood sugar has been fluctuating was 48 this morning Patient reports he was able to ambulate.   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  Antihypertensives amlodipine hydralazine and HCTZ has been discontinued, on compression stocking and abdominal binder.  Intermittently getting fluids.  Fluid rate striction is removed from his diet per nephrology.  Midodrine discontinued due to supine hypertension, Florinef will not be effective given his ESRD-Nephrology giving IV fluids today with dialysis.  BP fluctuating 150s-180s.  Monitor orthostatics once asymptomatic and stable we will plan on discharging.  Monitor blood pressure, may need to resume antihypertensive-await further nephrology monitoring.  Diabetes mellitus with DKA on presentation DKA resolved. Hypoglycemia:After resolution of  the patient's DKA, he had several episodes of hypoglycemia-on 20 units Lantus but again due to hypoglycemia 48 this morning Lantus decreased to 16 units.  Continue SSI.  Recent Labs  Lab 07/26/22 0810 07/26/22 1240 07/26/22 1753 07/26/22 2141 07/27/22 1229  GLUCAP 174* 147* 223* 151* 72   Diabetic gastroparesis new diagnosis-severely delayed gastric emptying study. Cont Reglan 5 mg tid,PPI. Cont supportive care.   Hypokalemia being addressed with ST HLD: On aspirin, Plavix and statin.  ESRD on HD: Fluid restriction removed from diet, getting fluids with dialysis today.  Nephrology managing. Anemia of chronic disease on Aranesp every Thursday. Metabolic bone disease continue patient's calcitriol, monitor Phos calcium  Shingles S/P 7 days of Valtrex, unless less than  DVT prophylaxis: Place TED hose Start: 07/16/22 0607 heparin injection 5,000 Units Start: 07/11/22 1400 SCDs Start: 07/11/22 0425 Code Status:   Code Status: Full Code Family Communication: plan of care discussed with patient at bedside. Patient status is: inpatient because of symptomatic orthostatic hypotension Level of care: Telemetry Medical   Dispo: The patient is from: home            Anticipated disposition: home once OV resolves, Bp stable  Mobility Assessment (last 72 hours)     Mobility Assessment     Row Name 07/26/22 2030 07/26/22 1431 07/26/22 1000 07/25/22 1934 07/25/22 0930   Does patient have an order for bedrest or is patient medically unstable No - Continue assessment -- No - Continue assessment No - Continue assessment No - Continue assessment   What is the highest level of mobility based on the progressive mobility assessment? Level 5 (Walks with assist in room/hall) - Balance while stepping forward/back and can walk in room with assist - Complete Level 5 (Walks with assist in room/hall) -  Balance while stepping forward/back and can walk in room with assist - Complete Level 6 (Walks independently in  room and hall) - Balance while walking in room without assist - Complete Level 5 (Walks with assist in room/hall) - Balance while stepping forward/back and can walk in room with assist - Complete Level 5 (Walks with assist in room/hall) - Balance while stepping forward/back and can walk in room with assist - Complete   Is the above level different from baseline mobility prior to current illness? No - Consider discontinuing PT/OT -- No - Consider discontinuing PT/OT Yes - Recommend PT order Yes - Recommend PT order    [place] Name 07/24/22 2032           Does patient have an order for bedrest or is patient medically unstable No - Continue assessment       What is the highest level of mobility based on the progressive mobility assessment? Level 5 (Walks with assist in room/hall) - Balance while stepping forward/back and can walk in room with assist - Complete       Is the above level different from baseline mobility prior to current illness? Yes - Recommend PT order                 Objective: Vitals last 24 hrs: Vitals:   07/27/22 1115 07/27/22 1131 07/27/22 1143 07/27/22 1252  BP: (!) 183/83 (!) 182/84    Pulse: 72 72    Resp: 11 (!) 24    Temp:   98.2 F (36.8 C)   TempSrc:   Oral   SpO2: 100% 100%    Weight:    75.4 kg   Weight change:   Physical Examination: General exam: alert awake, older than stated age HEENT:Oral mucosa moist, Ear/Nose WNL grossly Respiratory system: bilaterally clear BS, no use of accessory muscle Cardiovascular system: S1 & S2 +, No JVD. Gastrointestinal system: Abdomen soft,NT,ND, BS+ Nervous System:Alert, awake, moving extremities. Extremities: LE edema neg,distal peripheral pulses palpable.  Skin: No rashes,no icterus. MSK: Normal muscle bulk,tone, power  Medications reviewed:  Scheduled Meds:  aspirin EC  81 mg Oral q morning   atorvastatin  80 mg Oral Daily   bisacodyl  10 mg Rectal Daily   calcitRIOL  0.5 mcg Oral Q T,Th,Sa-HD    Chlorhexidine Gluconate Cloth  6 each Topical Q0600   Chlorhexidine Gluconate Cloth  6 each Topical Q0600   clopidogrel  75 mg Oral q morning   darbepoetin (ARANESP) injection - DIALYSIS  100 mcg Intravenous Q Thu-HD   docusate sodium  200 mg Oral BID   heparin  5,000 Units Subcutaneous Q8H   insulin aspart  0-9 Units Subcutaneous TID WC   [START ON 07/28/2022] insulin glargine-yfgn  16 Units Subcutaneous Daily   metoCLOPramide  5 mg Oral TID AC & HS   pantoprazole  40 mg Oral BID   pentafluoroprop-tetrafluoroeth       polyethylene glycol  17 g Oral BID   sertraline  50 mg Oral Daily   valACYclovir  500 mg Oral Q24H   Continuous Infusions:  anticoagulant sodium citrate     sodium chloride     sodium phosphate 15 mmol in dextrose 5 % 250 mL infusion        Diet Order             Diet Carb Modified Fluid consistency: Thin; Room service appropriate? Yes  Diet effective now  Diet Carb Modified                  Intake/Output Summary (Last 24 hours) at 07/27/2022 1340 Last data filed at 07/27/2022 1143 Gross per 24 hour  Intake 671.89 ml  Output 0 ml  Net 671.89 ml   Net IO Since Admission: -3,433.68 mL [07/27/22 1340]  Wt Readings from Last 3 Encounters:  07/27/22 75.4 kg  06/26/22 82.1 kg  02/21/22 82.2 kg     Unresulted Labs (From admission, onward)     Start     Ordered   Signed and Held  Renal function panel  Once,   R       Question:  Specimen collection method  Answer:  Lab=Lab collect   Signed and Held          Data Reviewed: I have personally reviewed following labs and imaging studies CBC: Recent Labs  Lab 07/20/22 2323 07/22/22 0432 07/22/22 2108 07/25/22 1456 07/27/22 0816  WBC 6.6 6.5 7.7 7.8 6.3  NEUTROABS 3.7 3.4  --   --   --   HGB 10.0* 9.6* 10.3* 8.6* 8.3*  HCT 29.5* 28.7* 30.6* 26.2* 26.6*  MCV 87.3 87.2 86.2 89.7 93.0  PLT 255 239 264 294 751   Basic Metabolic Panel: Recent Labs  Lab 07/20/22 2323 07/22/22 0432  07/25/22 0619 07/27/22 0817  NA 138 134* 137 140  K 4.1 4.4 4.7 3.3*  CL 99 99 98 109  CO2 29 25 30 25   GLUCOSE 99 255* 151* 48*  BUN 36* 47* 31* 17  CREATININE 5.83* 6.84* 6.02* 4.13*  CALCIUM 9.1 9.0 9.9 7.8*  PHOS 2.5  --  1.5* 1.6*   GFR: Estimated Creatinine Clearance: 20.4 mL/min (A) (by C-G formula based on SCr of 4.13 mg/dL (H)). Liver Function Tests: Recent Labs  Lab 07/20/22 2323 07/22/22 0432 07/25/22 0619 07/27/22 0817  AST 21 13*  --   --   ALT 15 12  --   --   ALKPHOS 62 56  --   --   BILITOT 0.7 0.6  --   --   PROT 6.1* 5.6*  --   --   ALBUMIN 3.3* 3.1* 3.4* 2.8*   No results for input(s): "LIPASE", "AMYLASE" in the last 168 hours. No results for input(s): "AMMONIA" in the last 168 hours. Coagulation Profile: No results for input(s): "INR", "PROTIME" in the last 168 hours. BNP (last 3 results) No results for input(s): "PROBNP" in the last 8760 hours. HbA1C: No results for input(s): "HGBA1C" in the last 72 hours. CBG: Recent Labs  Lab 07/26/22 0810 07/26/22 1240 07/26/22 1753 07/26/22 2141 07/27/22 1229  GLUCAP 174* 147* 223* 151* 72   Lipid Profile: No results for input(s): "CHOL", "HDL", "LDLCALC", "TRIG", "CHOLHDL", "LDLDIRECT" in the last 72 hours. Thyroid Function Tests: No results for input(s): "TSH", "T4TOTAL", "FREET4", "T3FREE", "THYROIDAB" in the last 72 hours. Sepsis Labs: No results for input(s): "PROCALCITON", "LATICACIDVEN" in the last 168 hours.  No results found for this or any previous visit (from the past 240 hour(s)).  Antimicrobials: Anti-infectives (From admission, onward)    Start     Dose/Rate Route Frequency Ordered Stop   07/20/22 2000  valACYclovir (VALTREX) tablet 500 mg        500 mg Oral Every 24 hours 07/19/22 1051     07/19/22 1145  valACYclovir (VALTREX) tablet 500 mg        500 mg Oral NOW 07/19/22 1051 07/19/22 1150  Culture/Microbiology    Component Value Date/Time   SDES  01/02/2021 1210     BLOOD LEFT HAND Performed at [facility], [address] [place], [place] [address]    [facility]  01/02/2021 1210    BOTTLES DRAWN AEROBIC AND ANAEROBIC Blood Culture adequate volume Performed at [facility], [address] [place], [place] [address]    CULT  01/02/2021 1210    NO GROWTH 5 DAYS Performed at [facility], [place] [address] [place], [place] [address]    REPTSTATUS 01/07/2021 FINAL 01/02/2021 1210  Radiology Studies: No results found.   LOS: 16 days   Antonieta Pert, MD [facility]  07/27/2022, 1:40 PM

## 2022-07-27 NOTE — Progress Notes (Signed)
Inpatient Diabetes Program Recommendations  AACE/ADA: New Consensus Statement on Inpatient Glycemic Control (2015)  Target Ranges:  Prepandial:   less than 140 mg/dL      Peak postprandial:   less than 180 mg/dL (1-2 hours)      Critically ill patients:  140 - 180 mg/dL   Lab Results  Component Value Date   GLUCAP 72 07/27/2022   HGBA1C 11.2 (H) 07/11/2022    Review of Glycemic Control  Latest Reference Range & Units 07/26/22 08:10 07/26/22 12:40 07/26/22 17:53 07/26/22 21:41  Glucose-Capillary 70 - 99 mg/dL 174 (H) 147 (H) 223 (H) 151 (H)    Latest Reference Range & Units 07/27/22 08:17  Glucose 70 - 99 mg/dL 48 (L)   Diabetes history: DM   Current orders for Inpatient glycemic control:  Novolog 0-9 units tid with meals Semglee 20 units daily Inpatient Diabetes Program Recommendations:   Consider reducing Semglee to 16 units daily due to low fasting blood sugar.   Thanks,  Adah Perl, RN, BC-ADM Inpatient Diabetes Coordinator Pager [phone] (8a-5p)

## 2022-07-27 NOTE — Progress Notes (Addendum)
Received patient in bed,alert and oriented x 4. Vitals signs normal.  HD treatment issue: None Access used : Right  upper arm fistula : Duration of Tx. 3.5 hours. Fluid removed : None as per ordered. Bolus given : NS 500 ml as per ordered. Meds given : Aranesp 100 mcg. Post tx weigh: 75.4kg

## 2022-07-28 DIAGNOSIS — R739 Hyperglycemia, unspecified: Secondary | ICD-10-CM | POA: Diagnosis not present

## 2022-07-28 LAB — BASIC METABOLIC PANEL
Anion gap: 9 (ref 5–15)
BUN: 17 mg/dL (ref 6–20)
CO2: 26 mmol/L (ref 22–32)
Calcium: 8.9 mg/dL (ref 8.9–10.3)
Chloride: 99 mmol/L (ref 98–111)
Creatinine, Ser: 4.42 mg/dL — ABNORMAL HIGH (ref 0.61–1.24)
GFR, Estimated: 15 mL/min — ABNORMAL LOW (ref >60)
Glucose, Bld: 357 mg/dL — ABNORMAL HIGH (ref 70–99)
Potassium: 4.7 mmol/L (ref 3.5–5.1)
Sodium: 134 mmol/L — ABNORMAL LOW (ref 135–145)

## 2022-07-28 LAB — CBC
HCT: 28.8 % — ABNORMAL LOW (ref 39.0–52.0)
Hemoglobin: 9.7 g/dL — ABNORMAL LOW (ref 13.0–17.0)
MCH: 30 pg (ref 26.0–34.0)
MCHC: 33.7 g/dL (ref 30.0–36.0)
MCV: 89.2 fL (ref 80.0–100.0)
Platelets: 255 10*3/uL (ref 150–400)
RBC: 3.23 MIL/uL — ABNORMAL LOW (ref 4.22–5.81)
RDW: 17.7 % — ABNORMAL HIGH (ref 11.5–15.5)
WBC: 6.4 10*3/uL (ref 4.0–10.5)
nRBC: 0.3 % — ABNORMAL HIGH (ref 0.0–0.2)

## 2022-07-28 LAB — GLUCOSE, CAPILLARY
Glucose-Capillary: 464 mg/dL — ABNORMAL HIGH (ref 70–99)
Glucose-Capillary: 537 mg/dL (ref 70–99)
Glucose-Capillary: 401 mg/dL — ABNORMAL HIGH (ref 70–99)
Glucose-Capillary: 400 mg/dL — ABNORMAL HIGH (ref 70–99)
Glucose-Capillary: 164 mg/dL — ABNORMAL HIGH (ref 70–99)

## 2022-07-28 MED ORDER — INSULIN ASPART 100 UNIT/ML IJ SOLN
3.0000 [IU] | Freq: Once | INTRAMUSCULAR | Status: AC
  Administered 2022-07-28: 3 [IU] via SUBCUTANEOUS

## 2022-07-28 MED ORDER — SODIUM CHLORIDE 0.9 % IV BOLUS
500.0000 mL | Freq: Once | INTRAVENOUS | Status: AC
  Administered 2022-07-28: 500 mL via INTRAVENOUS

## 2022-07-28 MED ORDER — SODIUM CHLORIDE 0.9 % IV SOLN: INTRAVENOUS | Status: AC

## 2022-07-28 NOTE — TOC Progression Note (Signed)
Transition of Care [facility]) - Progression Note    Patient Details  Name: Louis Peters MRN: [id number] Date of Birth: April 25, 1966  Transition of Care [facility]) CM/SW [place], RN Phone Number: 07/28/2022, 1:27 PM  Clinical Narrative:     Continues to have orthostaic issues. As well Blood sugar elevated, insulin changed to account for hypoglycemia.  No HH needs at this time.  CM will continue to follow for needs, recommendations, and transitions of care  Expected Discharge Plan: Home/Self Care Barriers to Discharge: Barriers Resolved  Expected Discharge Plan and Services Expected Discharge Plan: Home/Self Care       Living arrangements for the past 2 months: Single Family Home Expected Discharge Date: 07/26/22                                     Social Determinants of Health (SDOH) Interventions    Readmission Risk Interventions    07/12/2022    1:22 PM  Readmission Risk Prevention Plan  Transportation Screening Complete  Medication Review Press photographer) Complete  PCP or Specialist appointment within 3-5 days of discharge Complete  HRI or [place] Complete  SW Recovery Care/Counseling Consult Complete  [place] Not Applicable

## 2022-07-28 NOTE — Progress Notes (Signed)
[facility] Progress Note   Subjective:    he had 450 mL uop over 10/26 and another 570 mL uop charted thus far today.  Last HD on 10/26 with no UF - got 500 mL NS with HD as well.  Marching in place with therapy his BP went to 80's/70's on standing.   Still orthostatic.  He does yard work and blows leaves.  Has had DM about 40 years.   Review of systems:    Denies shortness of breath or chest pain  Denies n/v/d  Objective Vitals:   07/27/22 2300 07/28/22 0400 07/28/22 0803 07/28/22 1218  BP: (!) 198/83 (!) 149/69 (!) 154/66 (!) 195/90  Pulse: 70 70 86 92  Resp: 18 15 15 17   Temp: 98.7 F (37.1 C) 98.7 F (37.1 C) 98.3 F (36.8 C) 98 F (36.7 C)  TempSrc: Oral Oral Oral Oral  SpO2: 98% 100% 100% 100%  Weight:      Height:       Physical Exam     General adult male in bed in no acute distress HEENT normocephalic atraumatic extraocular movements intact sclera anicteric Neck supple trachea midline Lungs clear to auscultation bilaterally normal work of breathing at rest  Heart S1S2 no rub Abdomen soft nontender nondistended Extremities no edema  Psych normal mood and affect Access: RUE AVF bruit and thrill   Filed Weights   07/25/22 1852 07/27/22 0819 07/27/22 1252  Weight: 75.2 kg 76 kg 75.4 kg    Intake/Output Summary (Last 24 hours) at 07/28/2022 1358 Last data filed at 07/28/2022 1218 Gross per 24 hour  Intake 1008.3 ml  Output 820 ml  Net 188.3 ml    Additional Objective Labs: Basic Metabolic Panel: Recent Labs  Lab 07/25/22 0619 07/27/22 0817 07/28/22 0506  NA 137 140 134*  K 4.7 3.3* 4.7  CL 98 109 99  CO2 30 25 26   GLUCOSE 151* 48* 357*  BUN 31* 17 17  CREATININE 6.02* 4.13* 4.42*  CALCIUM 9.9 7.8* 8.9  PHOS 1.5* 1.6*  --    Liver Function Tests: Recent Labs  Lab 07/22/22 0432 07/25/22 0619 07/27/22 0817  AST 13*  --   --   ALT 12  --   --   ALKPHOS 56  --   --   BILITOT 0.6  --   --   PROT 5.6*  --   --   ALBUMIN  3.1* 3.4* 2.8*   No results for input(s): "LIPASE", "AMYLASE" in the last 168 hours.  CBC: Recent Labs  Lab 07/22/22 0432 07/22/22 2108 07/25/22 1456 07/27/22 0816 07/28/22 0506  WBC 6.5 7.7 7.8 6.3 6.4  NEUTROABS 3.4  --   --   --   --   HGB 9.6* 10.3* 8.6* 8.3* 9.7*  HCT 28.7* 30.6* 26.2* 26.6* 28.8*  MCV 87.2 86.2 89.7 93.0 89.2  PLT 239 264 294 257 255   Blood Culture    Component Value Date/Time   SDES  01/02/2021 1210    BLOOD LEFT HAND Performed at [facility], [address], [place], [place] [address]    [facility]  01/02/2021 1210    BOTTLES DRAWN AEROBIC AND ANAEROBIC Blood Culture adequate volume Performed at [facility], [address] [place], [place] [address]    CULT  01/02/2021 1210    NO GROWTH 5 DAYS Performed at [facility], [place] [address] [place], [place] [address]    REPTSTATUS 01/07/2021  FINAL 01/02/2021 1210    Cardiac Enzymes: No results for input(s): "CKTOTAL", "CKMB", "CKMBINDEX", "TROPONINI" in the last 168 hours. CBG: Recent Labs  Lab 07/27/22 1229 07/27/22 1610 07/27/22 2116 07/28/22 0804 07/28/22 1216  GLUCAP 72 237* 384* 464* 537*   Iron Studies: No results for input(s): "IRON", "TIBC", "TRANSFERRIN", "FERRITIN" in the last 72 hours. Lab Results  Component Value Date   INR 0.9 07/22/2020   Studies/Results: No results found.  Medications:  anticoagulant sodium citrate      aspirin EC  81 mg Oral q morning   atorvastatin  80 mg Oral Daily   bisacodyl  10 mg Rectal Daily   calcitRIOL  0.5 mcg Oral Q T,Th,Sa-HD   Chlorhexidine Gluconate Cloth  6 each Topical Q0600   Chlorhexidine Gluconate Cloth  6 each Topical Q0600   clopidogrel  75 mg Oral q morning   [START ON 08/03/2022] darbepoetin (ARANESP) injection - DIALYSIS  100 mcg Subcutaneous Q Thu-1800   docusate sodium  200 mg Oral BID   heparin  5,000 Units Subcutaneous Q8H   insulin aspart  0-9 Units Subcutaneous TID WC   insulin  glargine-yfgn  16 Units Subcutaneous Daily   metoCLOPramide  5 mg Oral TID AC & HS   pantoprazole  40 mg Oral BID   polyethylene glycol  17 g Oral BID   sertraline  50 mg Oral Daily   valACYclovir  500 mg Oral Q24H    Dialysis Orders: TTS [facility] 4h  400/1.5  75kg  2/2 bath  Hep none [facility]UA AVF - last HD 10/7, post wt 76.1 - last Hb 10.4 on 10/5 - mircera 120 ug q 4 wks, last  9/14 > due 10/12 thurs - venofer 50 mg weekly - calcitriol 0.5 ug tiw po tts   Home meds include - amlodipine 10, aspirin, atovastatin, clopidogrel, humalog insulin/ degludec, hydralazine 50 tid, hctz 25 qd, labetalol 200 bid, pantoprazole, sevelamer carb 800 tid, prns/ vits/ supps   Assessment/Plan: ESRD - HD TTS.  had been below EDW - now slightly above.  Will need to raise EDW to 76 kg Shingles - new Dx.  Reporting minimal pain.  Will need precautions w/HD. Renally dosed medications.  Nausea/vomiting - new Dx gastroparesis. CT abdomen +constipation, no acute abnormalities. Per PMD. Now on reglan and antiemetics. resolved Orthostatic hypotension/ volume- note long-standing DM which is felt to be contributing. noted near syncopal episodes, all BP meds stopped - amlodipine, hydralazine, HCTZ per charting, and labetalol. Continue compression stockings and abdominal binder.  He has intermittently gotten fluids.  Have removed fluid restrictions from diet.  Midodrine was stopped as pressures were at 245 systolic.  Raised EDW by 1 kg.  Concerning given such a significant change in his medication requirement.  Appreciate primary team  NS 500 mL once now and NS 75 ml/ hr x 24 hours DKA/ DM1 - BS 1061 at admit./ continue SSI.  Per PMD Anemia esrd ESA recently started here.  Aranesp 100 q. Thursday.  Anticipate need to increase dose  MBD ckd -continue calcitriol.  hypophosphatemia - stopped [place] (new med) an repleted  Disposition - would continue inpatient monitoring.  Still quite orthostatic.  Additional fluids now      Claudia Desanctis, MD [facility] 07/28/2022,2:23 PM

## 2022-07-28 NOTE — Progress Notes (Signed)
Physical Therapy Treatment Patient Details Name: Louis Peters MRN: [id number] DOB: [birth year] Today's Date: 07/28/2022   History of Present Illness Pt is a 56 y/o male admitted secondary to hyperglycemia. Course complicated by persistent hypotension. Pt with persistent N/V, workup for gastroparesis 10/16. Of note, pt with shingles on R hip. PMH includes ESRD (HD TTS), T1DM.    PT Comments    Patient continues to be limited due to symptomatic orthostatic hypotension. He is eager to progress and tries to stand prior to BP measurement initially.  Standing and wanting to stay standing, but with blank stare and increased imbalance needing cues to sit due to symptomatic.  MD in the room stating plan to continue to try to address.  PT will continue to follow up and progress as able.   Orthostatic VS for the past 24 hrs (Last 3 readings):  BP- Lying Pulse- Lying BP- Sitting Pulse- Sitting BP- Standing at 0 minutes Pulse- Standing at 0 minutes BP- Standing at 3 minutes Pulse- Standing at 3 minutes  07/28/22 1406 -- -- 128/71 99 (!) 77/58 99 (!) 84/47 103  07/28/22 1400 -- -- 105/55 92 (!) 73/58 99 (!) 79/58 99     Recommendations for follow up therapy are one component of a multi-disciplinary discharge planning process, led by the attending physician.  Recommendations may be updated based on patient status, additional functional criteria and insurance authorization.  Follow Up Recommendations  No PT follow up     Assistance Recommended at Discharge Intermittent Supervision/Assistance  Patient can return home with the following A little help with bathing/dressing/bathroom;Help with stairs or ramp for entrance;Assist for transportation;Assistance with cooking/housework   Equipment Recommendations  None recommended by PT    Recommendations for Other Services       Precautions / Restrictions Precautions Precautions: Fall;Other (comment) Precaution Comments: +orthostatics Required Braces  or Orthoses: Other Brace Other Brace: thigh hi TEDs and abdominal binder     Mobility  Bed Mobility               General bed mobility comments: up in recliner    Transfers     Transfers: Sit to/from Stand Sit to Stand: Supervision           General transfer comment: stood x 3 from recliner with S    Ambulation/Gait Ambulation/Gait assistance: Min Web designer (Feet): 1 Feet Assistive device: 1 person hand held assist         General Gait Details: marching in place x 20 x 2   Stairs             Wheelchair Mobility    Modified [name] (Stroke Patients Only)       Balance Overall balance assessment: Needs assistance Sitting-balance support: Feet supported Sitting balance-Leahy Scale: Good     Standing balance support: No upper extremity supported Standing balance-Leahy Scale: Fair Standing balance comment: standing for BP measurement no UE support, marching in place with hands on PT's hands                            Cognition Arousal/Alertness: Awake/alert Behavior During Therapy: WFL for tasks assessed/performed Overall Cognitive Status: Within Functional Limits for tasks assessed                                          Exercises Other  Exercises Other Exercises: marching in place and mini squats    General Comments General comments (skin integrity, edema, etc.): already wearing TED stockings and Abd binder, tightened for session      Pertinent Vitals/Pain Pain Assessment Pain Assessment: No/denies pain    Home Living                          Prior Function            PT Goals (current goals can now be found in the care plan section) Progress towards PT goals: Not progressing toward goals - comment (unable to progress due to low BP)    Frequency    Min 3X/week      PT Plan Current plan remains appropriate    Co-evaluation              AM-PAC PT "6 Clicks"  Mobility   Outcome Measure  Help needed turning from your back to your side while in a flat bed without using bedrails?: A Little Help needed moving from lying on your back to sitting on the side of a flat bed without using bedrails?: A Little Help needed moving to and from a bed to a chair (including a wheelchair)?: A Little Help needed standing up from a chair using your arms (e.g., wheelchair or bedside chair)?: None Help needed to walk in hospital room?: Total Help needed climbing 3-5 steps with a railing? : Total 6 Click Score: 15    End of Session   Activity Tolerance: Treatment limited secondary to medical complications (Comment) (hypotension) Patient left: in chair   PT Visit Diagnosis: Unsteadiness on feet (R26.81);Muscle weakness (generalized) (M62.81)     Time: 2947-6546 PT Time Calculation (min) (ACUTE ONLY): 20 min  Charges:  $Therapeutic Activity: 8-22 mins                     Magda Kiel, PT Acute Rehabilitation Services [phone] 07/28/2022    Reginia Naas 07/28/2022, 4:44 PM

## 2022-07-28 NOTE — Progress Notes (Signed)
PROGRESS NOTE Louis Peters  GYJ:[id number] DOB: 1965-11-04 DOA: 07/10/2022 PCP: Bernerd Limbo, MD   Brief Narrative/Hospital Course: 56 year old male with ESRD on HD TTS, diabetes mellitus type 1, HTN, HLP presented with nausea vomiting and epigastric abdominal pain.  Patient is on long-acting insulin and 5 units with meals, reported that his blood sugars have been especially high over the last few days.  Patient reported that he developed progressively worsening nausea and vomiting in the last 24 hours, unable to hold anything down . Patient was admitted treated for DKA, transitioned to subcu insulin blood sugar stabilized on the having intermittent hypoglycemia further adjusted meds.  He had near-syncopal syncopal episode with orthostatic hypotension on 07/26/22- so discharge was held fluids were given   Subjective: Seen and examined this morning.  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  Overnight blood pressure in 190s to 200, discussed with Dr. Royce Macadamia, will plan to reassess orthostatic blood pressure with abdominal binder, ?  Meds for antihypertensive possibly as as needed: Defer to nephrology Midodrine discontinued due to supine hypertension, Florinef will not be effective  given his ESRD.  Diabetes mellitus with DKA on presentation DKA resolved. Hypoglycemia:After resolution of the patient's DKA, he had several episodes of hypoglycemia-on 20 units Lantus > decreased to 16 units 10/26 due to hypoglycemai in 48-> this morning poorly controlled blood sugar adjusting sliding scale: Informed RN to give extra 3 units, on top of 9 units from sliding scale> being close to lunch patient only got 3 units and got additional coverage for lunch.Diabetes has been challenging to manage inpatient. continue SSI DM coordinator following.  Recent Labs  Lab 07/26/22 2141 07/27/22 1229 07/27/22 1610 07/27/22 2116 07/28/22 0804  GLUCAP 151* 72 237* 384* 464*   Diabetic gastroparesis new diagnosis-severely delayed gastric emptying study. Cont Reglan 5 mg tid,PPI. Cont supportive care.   Hypokalemia being addressed with HD HLD: Cont his  Aspirin, Plavix and statin. ESRD on HD: Fluid restriction removed from diet, s/p IVF w/ HD 10/26-Nephrology managing. Anemia of chronic disease on Aranesp every Thursday.monitor hb 2-63 gm Metabolic bone disease continue patient's calcitriol, monitor Phos calcium Shingles S/P 7 days of Valtrex.  Rash is improving on the groin   DVT prophylaxis: Place TED hose Start: 07/16/22 0607 heparin injection 5,000 Units Start: 07/11/22 1400 SCDs Start: 07/11/22 0425 Code Status:   Code Status: Full Code Family Communication: plan of care discussed with patient at bedside.  Patient status is: inpatient because of symptomatic orthostatic hypotension/uncontrolled hyperglycemia hypoglycemia Level of care: Telemetry Medical   Dispo: The patient is from: home            Anticipated disposition: home once OV resolves, Bp stable and no more hypoglycemia Updated his wife on the phone  Mobility Assessment (last 72 hours)     Mobility Assessment  [place] Name 07/27/22 2000 07/27/22 1350 07/26/22 2030 07/26/22 1431 07/26/22 1000   Does patient have an order  for bedrest or is patient medically unstable No - Continue assessment No - Continue assessment No - Continue assessment -- No - Continue assessment   What is the highest level of mobility based on the progressive mobility assessment? Level 6 (Walks independently in room and hall) - Balance while walking in room without assist - Complete Level 6 (Walks independently in room and hall) - Balance while walking in room without assist - Complete Level 5 (Walks with assist in room/hall) - Balance while stepping forward/back and can walk in room with assist - Complete Level 5 (Walks with assist in room/hall) - Balance while stepping forward/back and can walk in room with assist - Complete Level 6 (Walks independently in room and hall) - Balance while walking in room without assist - Complete   Is the above level different from baseline mobility prior to current illness? -- -- No - Consider discontinuing PT/OT -- No - Consider discontinuing PT/OT    Row Name 07/25/22 1934           Does patient have an order for bedrest or is patient medically unstable No - Continue assessment       What is the highest level of mobility based on the progressive mobility assessment? Level 5 (Walks with assist in room/hall) - Balance while stepping forward/back and can walk in room with assist - Complete       Is the above level different from baseline mobility prior to current illness? Yes - Recommend PT order               Objective: Vitals last 24 hrs: Vitals:   07/27/22 2117 07/27/22 2300 07/28/22 0400 07/28/22 0803  BP: (!) 183/89 (!) 198/83 (!) 149/69 (!) 154/66  Pulse: 70 70 70 86  Resp: 17 18 15 15   Temp: 98.7 F (37.1 C) 98.7 F (37.1 C) 98.7 F (37.1 C) 98.3 F (36.8 C)  TempSrc: Oral Oral Oral Oral  SpO2: 98% 98% 100% 100%  Weight:      Height:       Weight change:   Physical Examination: General exam: AAOX3, weak,older appearing HEENT:Oral mucosa moist, Ear/Nose WNL grossly, dentition  normal. Respiratory system: bilaterally CLEAR BS, no use of accessory muscle Cardiovascular system: S1 & S2 +, regular rate. Gastrointestinal system: Abdomen soft, NT,ND,BS+ Nervous System:Alert, awake, moving extremities and grossly nonfocal Extremities: LE ankle edema NEG , lower extremities warm Skin: No rashes,no icterus. MSK: Normal muscle bulk,tone, power   Medications reviewed:  Scheduled Meds:  aspirin EC  81 mg Oral q morning   atorvastatin  80 mg Oral Daily   bisacodyl  10 mg Rectal Daily   calcitRIOL  0.5 mcg Oral Q T,Th,Sa-HD   Chlorhexidine Gluconate Cloth  6 each Topical Q0600   Chlorhexidine Gluconate Cloth  6 each Topical Q0600   clopidogrel  75 mg Oral q morning   [START ON 08/03/2022] darbepoetin (ARANESP) injection - DIALYSIS  100 mcg Subcutaneous Q Thu-1800   docusate sodium  200 mg Oral BID   heparin  5,000 Units Subcutaneous Q8H   insulin aspart  0-9 Units Subcutaneous TID WC   insulin aspart  3 Units Subcutaneous Once   insulin glargine-yfgn  16 Units Subcutaneous Daily   metoCLOPramide  5 mg Oral TID AC & HS   pantoprazole  40 mg Oral BID   polyethylene glycol  17 g  Oral BID   sertraline  50 mg Oral Daily   valACYclovir  500 mg Oral Q24H   Continuous Infusions:  anticoagulant sodium citrate        Diet Order             Diet Carb Modified Fluid consistency: Thin; Room service appropriate? Yes  Diet effective now           Diet Carb Modified                  Intake/Output Summary (Last 24 hours) at 07/28/2022 1106 Last data filed at 07/28/2022 0946 Gross per 24 hour  Intake 1008.3 ml  Output 770 ml  Net 238.3 ml    Net IO Since Admission: -3,195.38 mL [07/28/22 1106]  Wt Readings from Last 3 Encounters:  07/27/22 75.4 kg  06/26/22 82.1 kg  02/21/22 82.2 kg     Unresulted Labs (From admission, onward)     Start     Ordered   Signed and Held  Renal function panel  Once,   R       Question:  Specimen collection method  Answer:   Lab=Lab collect   Signed and Held          Data Reviewed: I have personally reviewed following labs and imaging studies CBC: Recent Labs  Lab 07/22/22 0432 07/22/22 2108 07/25/22 1456 07/27/22 0816 07/28/22 0506  WBC 6.5 7.7 7.8 6.3 6.4  NEUTROABS 3.4  --   --   --   --   HGB 9.6* 10.3* 8.6* 8.3* 9.7*  HCT 28.7* 30.6* 26.2* 26.6* 28.8*  MCV 87.2 86.2 89.7 93.0 89.2  PLT 239 264 294 257 935    Basic Metabolic Panel: Recent Labs  Lab 07/22/22 0432 07/25/22 0619 07/27/22 0817 07/28/22 0506  NA 134* 137 140 134*  K 4.4 4.7 3.3* 4.7  CL 99 98 109 99  CO2 25 30 25 26   GLUCOSE 255* 151* 48* 357*  BUN 47* 31* 17 17  CREATININE 6.84* 6.02* 4.13* 4.42*  CALCIUM 9.0 9.9 7.8* 8.9  PHOS  --  1.5* 1.6*  --     GFR: Estimated Creatinine Clearance: 19 mL/min (A) (by C-G formula based on SCr of 4.42 mg/dL (H)). Liver Function Tests: Recent Labs  Lab 07/22/22 0432 07/25/22 0619 07/27/22 0817  AST 13*  --   --   ALT 12  --   --   ALKPHOS 56  --   --   BILITOT 0.6  --   --   PROT 5.6*  --   --   ALBUMIN 3.1* 3.4* 2.8*    No results for input(s): "LIPASE", "AMYLASE" in the last 168 hours. No results for input(s): "AMMONIA" in the last 168 hours. Coagulation Profile: No results for input(s): "INR", "PROTIME" in the last 168 hours. BNP (last 3 results) No results for input(s): "PROBNP" in the last 8760 hours. HbA1C: No results for input(s): "HGBA1C" in the last 72 hours. CBG: Recent Labs  Lab 07/26/22 2141 07/27/22 1229 07/27/22 1610 07/27/22 2116 07/28/22 0804  GLUCAP 151* 72 237* 384* 464*    Lipid Profile: No results for input(s): "CHOL", "HDL", "LDLCALC", "TRIG", "CHOLHDL", "LDLDIRECT" in the last 72 hours. Thyroid Function Tests: No results for input(s): "TSH", "T4TOTAL", "FREET4", "T3FREE", "THYROIDAB" in the last 72 hours. Sepsis Labs: No results for input(s): "PROCALCITON", "LATICACIDVEN" in the last 168 hours.  No results found for this or any  previous visit (from the past 240 hour(s)).  Antimicrobials: Anti-infectives (From admission, onward)    Start     Dose/Rate Route Frequency Ordered Stop   07/20/22 2000  valACYclovir (VALTREX) tablet 500 mg        500 mg Oral Every 24 hours 07/19/22 1051     07/19/22 1145  valACYclovir (VALTREX) tablet 500 mg        500 mg Oral NOW 07/19/22 1051 07/19/22 1150      Culture/Microbiology    Component Value Date/Time   SDES  01/02/2021 1210    BLOOD LEFT HAND Performed at [facility], [address] [place], [place] [address]    [facility]  01/02/2021 1210    BOTTLES DRAWN AEROBIC AND ANAEROBIC Blood Culture adequate volume Performed at [facility], [address] [place], [place] [address]    CULT  01/02/2021 1210    NO GROWTH 5 DAYS Performed at [facility], [place] [address], [place], [place] [address]    REPTSTATUS 01/07/2021 FINAL 01/02/2021 1210  Radiology Studies: No results found.   LOS: 17 days   Antonieta Pert, MD [facility]  07/28/2022, 11:06 AM

## 2022-07-28 NOTE — Progress Notes (Signed)
Inpatient Diabetes Program Recommendations  AACE/ADA: New Consensus Statement on Inpatient Glycemic Control (2015)  Target Ranges:  Prepandial:   less than 140 mg/dL      Peak postprandial:   less than 180 mg/dL (1-2 hours)      Critically ill patients:  140 - 180 mg/dL   Lab Results  Component Value Date   GLUCAP 537 (HH) 07/28/2022   HGBA1C 11.2 (H) 07/11/2022    Review of Glycemic Control  Latest Reference Range & Units 07/27/22 12:29 07/27/22 16:10 07/27/22 21:16 07/28/22 08:04 07/28/22 12:16  Glucose-Capillary 70 - 99 mg/dL 72 237 (H) 384 (H) 464 (H) 537 (HH)   Diabetes history: DM  Outpatient Diabetes medications:  Freestyle libre Humalog 5 units tid with meals  Tresiba 15 units q HS Current orders for Inpatient glycemic control:  Novolog 0-9 units tid with meals  Semglee 16 units daily  Inpatient Diabetes Program Recommendations:    Note that blood sugars markedly increased today. Patient did not receive Semglee yesterday due to hypoglycemia. Notified MD and RN.    Thanks,  Adah Perl, RN, BC-ADM Inpatient Diabetes Coordinator Pager [phone]  (8a-5p)

## 2022-07-29 DIAGNOSIS — R739 Hyperglycemia, unspecified: Secondary | ICD-10-CM | POA: Diagnosis not present

## 2022-07-29 LAB — CBC
HCT: 28.4 % — ABNORMAL LOW (ref 39.0–52.0)
Hemoglobin: 9.5 g/dL — ABNORMAL LOW (ref 13.0–17.0)
MCH: 30.3 pg (ref 26.0–34.0)
MCHC: 33.5 g/dL (ref 30.0–36.0)
MCV: 90.4 fL (ref 80.0–100.0)
Platelets: 269 10*3/uL (ref 150–400)
RBC: 3.14 MIL/uL — ABNORMAL LOW (ref 4.22–5.81)
RDW: 18.8 % — ABNORMAL HIGH (ref 11.5–15.5)
WBC: 7.7 10*3/uL (ref 4.0–10.5)
nRBC: 0 % (ref 0.0–0.2)

## 2022-07-29 LAB — GLUCOSE, CAPILLARY
Glucose-Capillary: 127 mg/dL — ABNORMAL HIGH (ref 70–99)
Glucose-Capillary: 121 mg/dL — ABNORMAL HIGH (ref 70–99)
Glucose-Capillary: 204 mg/dL — ABNORMAL HIGH (ref 70–99)
Glucose-Capillary: 124 mg/dL — ABNORMAL HIGH (ref 70–99)

## 2022-07-29 LAB — BASIC METABOLIC PANEL
Anion gap: 12 (ref 5–15)
BUN: 31 mg/dL — ABNORMAL HIGH (ref 6–20)
CO2: 27 mmol/L (ref 22–32)
Calcium: 9.5 mg/dL (ref 8.9–10.3)
Chloride: 101 mmol/L (ref 98–111)
Creatinine, Ser: 5.36 mg/dL — ABNORMAL HIGH (ref 0.61–1.24)
GFR, Estimated: 12 mL/min — ABNORMAL LOW (ref >60)
Glucose, Bld: 79 mg/dL (ref 70–99)
Potassium: 4.3 mmol/L (ref 3.5–5.1)
Sodium: 140 mmol/L (ref 135–145)

## 2022-07-29 MED ORDER — MIDODRINE HCL 5 MG PO TABS
2.5000 mg | ORAL_TABLET | Freq: Three times a day (TID) | ORAL | Status: DC
  Filled 2022-07-29 (×3): qty 1

## 2022-07-29 MED ORDER — SODIUM CHLORIDE 0.9 % IV BOLUS
500.0000 mL | Freq: Once | INTRAVENOUS | Status: AC
  Administered 2022-07-29: 500 mL via INTRAVENOUS

## 2022-07-29 NOTE — Plan of Care (Signed)
  Problem: Education: Goal: Ability to describe self-care measures that may prevent or decrease complications (Diabetes Survival Skills Education) will improve Outcome: Progressing   Problem: Health Behavior/Discharge Planning: Goal: Ability to manage health-related needs will improve Outcome: Progressing   Problem: Fluid Volume: Goal: Ability to achieve a balanced intake and output will improve Outcome: Progressing

## 2022-07-29 NOTE — Progress Notes (Signed)
PROGRESS NOTE Louis Peters  YIR:[id number] DOB: 10/04/1965 DOA: 07/10/2022 PCP: Bernerd Limbo, MD   Brief Narrative/Hospital Course: 56 year old male with ESRD on HD TTS, diabetes mellitus type 1, HTN, HLP presented with nausea vomiting and epigastric abdominal pain.  Patient is on long-acting insulin and 5 units with meals, reported that his blood sugars have been especially high over the last few days.  Patient reported that he developed progressively worsening nausea and vomiting in the last 24 hours, unable to hold anything down . Patient was admitted treated for DKA, transitioned to subcu insulin blood sugar stabilized on the having intermittent hypoglycemia further adjusted meds.  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 Repeat orthostatic after HD today ?Meds for antihypertensive possibly  as PRN-defer to nephrology.Midodrine discontinued due to supine hypertension, Florinef will not be effective given his ESRD.  Diabetes mellitus with DKA on presentation DKA resolved. Hypoglycemia:After resolution of the patient's DKA, he had  several episodes of hypoglycemia-on 20 units Lantus > decreased to 16 units 10/26 due to hypoglycemai in 48.  Blood sugar appears stable today, was seen 400-500 yesterday.  Continue current long-acting insulin, sliding scale.  He has been diabetic for 40 years and has been managing at home.  DM coordinator following. Recent Labs  Lab 07/28/22 1216 07/28/22 1438 07/28/22 1644 07/28/22 2136 07/29/22 0821  GLUCAP 537* 401* 400* 164* 127*   Diabetic gastroparesis new diagnosis-severely delayed gastric emptying study. Cont Reglan 5 mg tid,PPI. Hypokalemia resolved  HLD: Cont his  Aspirin, Plavix and statin. ESRD on HD: Fluid restriction removed from diet, patient getting fluids, getting HD today, nephrology managing.   Anemia of chronic disease on Aranesp every Thursday.monitor hb, it's 5-00 gm Metabolic bone disease continue patient's calcitriol, monitor Phos calcium Shingles S/P Valtrex.Rash is crusting   DVT prophylaxis: Place TED hose Start: 07/16/22 0607 heparin injection 5,000 Units Start: 07/11/22 1400 SCDs Start: 07/11/22 0425 Code Status:   Code Status: Full Code Family Communication: plan of care discussed with patient at bedside.  Patient status is: inpatient because of symptomatic orthostatic hypotension/uncontrolled hyperglycemia hypoglycemia Level of care: Telemetry Medical   Dispo: The patient is from: home            Anticipated disposition: Plan for home with home health anticipate home in next 24 hrs  Mobility Assessment (last 72 hours)     Mobility Assessment     Row Name 07/28/22 2122 07/28/22 1643 07/27/22 2000 07/27/22 1350 07/26/22 2030   Does patient have an order for bedrest or is patient medically unstable -- -- No - Continue assessment No - Continue assessment No - Continue assessment   What is the highest level of mobility based on the progressive mobility assessment? Level 4 (  Walks with assist in room) - Balance while marching in place and cannot step  forward and back - Complete Level 4 (Walks with assist in room) - Balance while marching in place and cannot step forward and back - Complete Level 6 (Walks independently in room and hall) - Balance while walking in room without assist - Complete Level 6 (Walks independently in room and hall) - Balance while walking in room without assist - Complete Level 5 (Walks with assist in room/hall) - Balance while stepping forward/back and can walk in room with assist - Complete   Is the above level different from baseline mobility prior to current illness? -- -- -- -- No - Consider discontinuing PT/OT    Row Name 07/26/22 1431           What is the highest level of mobility based on the progressive mobility assessment? Level 5 (Walks with assist in room/hall) - Balance while stepping forward/back and can walk in room with assist - Complete               Objective: Vitals last 24 hrs: Vitals:   07/29/22 1215 07/29/22 1230 07/29/22 1245 07/29/22 1300  BP: (!) 155/72 (!) 159/91 (!) 163/74 (!) 148/72  Pulse: 75 79 75 76  Resp: 11 16 15 13   Temp:      TempSrc:      SpO2: 100% 100% 100% 99%  Weight:      Height:       Weight change:   Physical Examination: General exam: AAIX3, not in distress, pleasant HEENT:Oral mucosa moist, Ear/Nose WNL grossly, dentition normal. Respiratory system: bilaterally clear BS,no use of accessory muscle Cardiovascular system: S1 & S2 +, regular rate, JVD neg. Gastrointestinal system: Abdomen soft,NT,ND,BS+ Nervous System:Alert, awake, moving extremities and grossly nonfocal Extremities: LE ankle edema neg, lower extremities warm Skin: No rashes,no icterus. MSK: Normal muscle bulk,tone, power    Medications reviewed:  Scheduled Meds:  aspirin EC  81 mg Oral q morning   atorvastatin  80 mg Oral Daily   bisacodyl  10 mg Rectal Daily   calcitRIOL  0.5 mcg Oral Q T,Th,Sa-HD   Chlorhexidine Gluconate Cloth  6 each Topical Q0600   Chlorhexidine Gluconate Cloth   6 each Topical Q0600   clopidogrel  75 mg Oral q morning   [START ON 08/03/2022] darbepoetin (ARANESP) injection - DIALYSIS  100 mcg Subcutaneous Q Thu-1800   docusate sodium  200 mg Oral BID   heparin  5,000 Units Subcutaneous Q8H   insulin aspart  0-9 Units Subcutaneous TID WC   insulin glargine-yfgn  16 Units Subcutaneous Daily   metoCLOPramide  5 mg Oral TID AC & HS   pantoprazole  40 mg Oral BID   polyethylene glycol  17 g Oral BID   sertraline  50 mg Oral Daily   Continuous Infusions:  sodium chloride 75 mL/hr at 07/29/22 0439   anticoagulant sodium citrate        Diet Order             Diet Carb Modified Fluid consistency: Thin; Room service appropriate? Yes  Diet effective now           Diet Carb Modified                  Intake/Output Summary (Last 24 hours) at 07/29/2022 1323 Last data filed at 07/29/2022 0439 Gross per 24 hour  Intake 775 ml  Output --  Net 775 ml    Net  IO Since Admission: -2,670.38 mL [07/29/22 1323]  Wt Readings from Last 3 Encounters:  07/27/22 75.4 kg  06/26/22 82.1 kg  02/21/22 82.2 kg     Unresulted Labs (From admission, onward)     Start     Ordered   Signed and Held  Renal function panel  Once,   R       Question:  Specimen collection method  Answer:  Lab=Lab collect   Signed and Held          Data Reviewed: I have personally reviewed following labs and imaging studies CBC: Recent Labs  Lab 07/22/22 2108 07/25/22 1456 07/27/22 0816 07/28/22 0506 07/29/22 0310  WBC 7.7 7.8 6.3 6.4 7.7  HGB 10.3* 8.6* 8.3* 9.7* 9.5*  HCT 30.6* 26.2* 26.6* 28.8* 28.4*  MCV 86.2 89.7 93.0 89.2 90.4  PLT 264 294 257 255 970    Basic Metabolic Panel: Recent Labs  Lab 07/25/22 0619 07/27/22 0817 07/28/22 0506 07/29/22 0310  NA 137 140 134* 140  K 4.7 3.3* 4.7 4.3  CL 98 109 99 101  CO2 30 25 26 27   GLUCOSE 151* 48* 357* 79  BUN 31* 17 17 31*  CREATININE 6.02* 4.13* 4.42* 5.36*  CALCIUM 9.9 7.8* 8.9 9.5  PHOS 1.5*  1.6*  --   --     GFR: Estimated Creatinine Clearance: 15.7 mL/min (A) (by C-G formula based on SCr of 5.36 mg/dL (H)). Liver Function Tests: Recent Labs  Lab 07/25/22 0619 07/27/22 0817  ALBUMIN 3.4* 2.8*    No results for input(s): "LIPASE", "AMYLASE" in the last 168 hours. No results for input(s): "AMMONIA" in the last 168 hours. Coagulation Profile: No results for input(s): "INR", "PROTIME" in the last 168 hours. BNP (last 3 results) No results for input(s): "PROBNP" in the last 8760 hours. HbA1C: No results for input(s): "HGBA1C" in the last 72 hours. CBG: Recent Labs  Lab 07/28/22 1216 07/28/22 1438 07/28/22 1644 07/28/22 2136 07/29/22 0821  GLUCAP 537* 401* 400* 164* 127*    Lipid Profile: No results for input(s): "CHOL", "HDL", "LDLCALC", "TRIG", "CHOLHDL", "LDLDIRECT" in the last 72 hours. Thyroid Function Tests: No results for input(s): "TSH", "T4TOTAL", "FREET4", "T3FREE", "THYROIDAB" in the last 72 hours. Sepsis Labs: No results for input(s): "PROCALCITON", "LATICACIDVEN" in the last 168 hours.  No results found for this or any previous visit (from the past 240 hour(s)).  Antimicrobials: Anti-infectives (From admission, onward)    Start     Dose/Rate Route Frequency Ordered Stop   07/20/22 2000  valACYclovir (VALTREX) tablet 500 mg  Status:  Discontinued        500 mg Oral Every 24 hours 07/19/22 1051 07/29/22 1233   07/19/22 1145  valACYclovir (VALTREX) tablet 500 mg        500 mg Oral NOW 07/19/22 1051 07/19/22 1150      Culture/Microbiology    Component Value Date/Time   SDES  01/02/2021 1210    BLOOD LEFT HAND Performed at [facility], [address] [place], [place] [address]    [facility]  01/02/2021 1210    BOTTLES DRAWN AEROBIC AND ANAEROBIC Blood Culture adequate volume Performed at [facility], [address] [place], [place] [address]    CULT  01/02/2021 1210    NO GROWTH 5 DAYS Performed at [facility], [place] [address] [place], [place] [address]    REPTSTATUS 01/07/2021 FINAL 01/02/2021 1210  Radiology Studies: No results found.  LOS: 18 days   Antonieta Pert, MD [facility]  07/29/2022, 1:23 PM

## 2022-07-29 NOTE — Progress Notes (Signed)
PT IN BED RESTING NO C/OS VSS STABLE FOR HD

## 2022-07-29 NOTE — Progress Notes (Signed)
   07/29/22 1315  Vitals  Temp 98.6 F (37 C)  Pulse Rate 75  Resp 19  BP (!) 170/81  SpO2 99 %  O2 Device Room Air  Oxygen Therapy  Patient Activity (if Appropriate) In bed  Pulse Oximetry Type Continuous  Post Treatment  Dialyzer Clearance Clear  Duration of HD Treatment -hour(s) 3.5 hour(s)  Hemodialysis Intake (mL) 0 mL  Liters Processed 82.6  Fluid Removed 1000 mL  Tolerated HD Treatment Yes  AVG/AVF Arterial Site Held (minutes) 5 minutes  AVG/AVF Venous Site Held (minutes) 6 minutes   Received patient in bed to unit.  Alert and oriented.  Informed consent signed and in chart.   Treatment initiated: 0930  Treatment completed: 1300  Patient tolerated well.  Transported back to the room  Alert, without acute distress.  Hand-off given to patient's nurse.   Access used: RUA AVF Access issues: NONE  Total UF removed: 1L   [facility]

## 2022-07-29 NOTE — Progress Notes (Signed)
[facility] Progress Note   Subjective:    The patient was dialyzed in his room due to contact precautions.  He had 1 kg UF with HD (note order was written for keep even).  He had 570 mL uop over 10/27.   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    Review of systems:     Denies shortness of breath or chest pain  Denies n/v/d  Objective Vitals:   07/29/22 1230 07/29/22 1245 07/29/22 1300 07/29/22 1315  BP: (!) 159/91 (!) 163/74 (!) 148/72 (!) 170/81  Pulse: 79 75 76 75  Resp: 16 15 13 19   Temp:    98.6 F (37 C)  TempSrc:      SpO2: 100% 100% 99% 99%  Weight:      Height:       Physical Exam     General adult male in bed in no acute distress HEENT normocephalic atraumatic extraocular movements intact sclera anicteric Neck supple trachea midline Lungs clear to auscultation bilaterally normal work of breathing at rest  Heart S1S2 no rub Abdomen soft nontender nondistended Extremities no edema  Psych normal mood and affect Access: RUE AVF bruit and thrill   Filed Weights   07/25/22 1852 07/27/22 0819 07/27/22 1252  Weight: 75.2 kg 76 kg 75.4 kg    Intake/Output Summary (Last 24 hours) at 07/29/2022 1516 Last data filed at 07/29/2022 1315 Gross per 24 hour  Intake 775 ml  Output 1000 ml  Net -225 ml    Additional Objective Labs: Basic Metabolic Panel: Recent Labs  Lab 07/25/22 0619 07/27/22 0817 07/28/22 0506 07/29/22 0310  NA 137 140 134* 140  K 4.7 3.3* 4.7 4.3  CL 98 109 99 101  CO2 30 25 26 27   GLUCOSE 151* 48* 357* 79  BUN 31* 17 17 31*  CREATININE 6.02* 4.13* 4.42* 5.36*  CALCIUM 9.9 7.8* 8.9 9.5  PHOS 1.5* 1.6*  --   --    Liver Function Tests: Recent Labs  Lab 07/25/22 0619 07/27/22 0817  ALBUMIN 3.4* 2.8*   No results for input(s): "LIPASE", "AMYLASE" in the last 168 hours.  CBC: Recent Labs  Lab 07/22/22 2108 07/25/22 1456 07/27/22 0816 07/28/22 0506 07/29/22 0310  WBC  7.7 7.8 6.3 6.4 7.7  HGB 10.3* 8.6* 8.3* 9.7* 9.5*  HCT 30.6* 26.2* 26.6* 28.8* 28.4*  MCV 86.2 89.7 93.0 89.2 90.4  PLT 264 294 257 255 269   Blood Culture    Component Value Date/Time   SDES  01/02/2021 1210    BLOOD LEFT HAND Performed at [facility], [address], [place], [place] [address]    [facility]  01/02/2021 1210    BOTTLES DRAWN AEROBIC AND ANAEROBIC Blood Culture adequate volume Performed at [facility], [address] [place], [place] [address]    CULT  01/02/2021 1210    NO GROWTH 5 DAYS Performed at [facility], [place] [address] [place], [place] [address]    REPTSTATUS 01/07/2021 FINAL 01/02/2021 1210    Cardiac Enzymes: No results for input(s): "CKTOTAL", "CKMB", "CKMBINDEX", "TROPONINI" in the last 168 hours. CBG: Recent Labs  Lab 07/28/22 1438 07/28/22 1644 07/28/22 2136 07/29/22 0821 07/29/22 1436  GLUCAP 401* 400* 164* 127* 121*   Iron Studies: No results for input(s): "IRON", "TIBC", "TRANSFERRIN", "FERRITIN" in the last 72 hours. Lab Results  Component Value Date  INR 0.9 07/22/2020   Studies/Results: No results found.  Medications:  anticoagulant sodium citrate      aspirin EC  81 mg Oral q morning   atorvastatin  80 mg Oral Daily   bisacodyl  10 mg Rectal Daily   calcitRIOL  0.5 mcg Oral Q T,Th,Sa-HD   Chlorhexidine Gluconate Cloth  6 each Topical Q0600   Chlorhexidine Gluconate Cloth  6 each Topical Q0600   clopidogrel  75 mg Oral q morning   [START ON 08/03/2022] darbepoetin (ARANESP) injection - DIALYSIS  100 mcg Subcutaneous Q Thu-1800   docusate sodium  200 mg Oral BID   heparin  5,000 Units Subcutaneous Q8H   insulin aspart  0-9 Units Subcutaneous TID WC   insulin glargine-yfgn  16 Units Subcutaneous Daily   metoCLOPramide  5 mg Oral TID AC & HS   pantoprazole  40 mg Oral BID   polyethylene glycol  17 g Oral BID   sertraline  50 mg Oral Daily    Dialysis Orders: TTS [facility] 4h  400/1.5   75kg  2/2 bath  Hep none RUA AVF - last HD 10/7, post wt 76.1 - last Hb 10.4 on 10/5 - mircera 120 ug q 4 wks, last  9/14 > due 10/12 thurs - venofer 50 mg weekly - calcitriol 0.5 ug tiw po tts   Home meds include - amlodipine 10, aspirin, atovastatin, clopidogrel, humalog insulin/ degludec, hydralazine 50 tid, hctz 25 qd, labetalol 200 bid, pantoprazole, sevelamer carb 800 tid, prns/ vits/ supps   Assessment/Plan: ESRD - HD TTS.  he dialyzed here today and fluid was removed despite my order to keep even.  Will need to raise EDW to 76 kg Shingles - new Dx.  Reporting minimal pain.  Will need precautions w/HD. Renally dosed medications.  Nausea/vomiting - new Dx gastroparesis. CT abdomen +constipation, no acute abnormalities. Per PMD. Now on reglan and antiemetics. resolved Orthostatic hypotension/ volume- note long-standing DM which is felt to be contributing. noted near syncopal episodes, all BP meds stopped - amlodipine, hydralazine, HCTZ per charting, and labetalol. Continue compression stockings and abdominal binder.  He has intermittently gotten fluids and has intermittently had fluid removed with HD in error.  Had fluid removed today.  Have removed fluid restrictions from diet.  Start back midodrine 2.5 mg TID (was at 768 systolic or more on the 5 mg dose).  Raised EDW by 1 kg.  Concerning given such a significant change in his medication requirement.  Appreciate primary team  NS 500 mL once now DKA/DM1 BS 1061 at admit.  Per primary team  Anemia esrd ESA recently started here.  Aranesp 100 q. Thursday.  Anticipate need to increase dose  MBD ckd -continue calcitriol.  hypophosphatemia - stopped [place] (new med) and repleted  Disposition - Per primary team.  Still orthostatic - fluids were removed with HD today in error.  Giving additional fluids now.  Repeat orthostatics after fluids.  Cautiously start back midodrine 2.5 mg TID.     Claudia Desanctis, MD [facility] 07/29/2022,3:16 PM

## 2022-07-30 DIAGNOSIS — R739 Hyperglycemia, unspecified: Secondary | ICD-10-CM | POA: Diagnosis not present

## 2022-07-30 DIAGNOSIS — K3184 Gastroparesis: Secondary | ICD-10-CM | POA: Diagnosis not present

## 2022-07-30 DIAGNOSIS — N186 End stage renal disease: Secondary | ICD-10-CM | POA: Diagnosis not present

## 2022-07-30 DIAGNOSIS — E1022 Type 1 diabetes mellitus with diabetic chronic kidney disease: Secondary | ICD-10-CM | POA: Diagnosis not present

## 2022-07-30 LAB — GLUCOSE, CAPILLARY
Glucose-Capillary: 202 mg/dL — ABNORMAL HIGH (ref 70–99)
Glucose-Capillary: 329 mg/dL — ABNORMAL HIGH (ref 70–99)
Glucose-Capillary: 257 mg/dL — ABNORMAL HIGH (ref 70–99)

## 2022-07-30 MED ORDER — MIDODRINE HCL 2.5 MG PO TABS: 2.5000 mg | ORAL_TABLET | Freq: Three times a day (TID) | ORAL | 0 refills | Status: AC

## 2022-07-30 MED ORDER — METOCLOPRAMIDE HCL 5 MG PO TABS: 5.0000 mg | ORAL_TABLET | Freq: Three times a day (TID) | ORAL | 0 refills | Status: DC

## 2022-07-30 NOTE — Progress Notes (Signed)
[facility] Progress Note   Subjective:    The patient was dialyzed in his room on 10/28 due to airborne contact precautions.  He had 1 kg UF with HD (however note order was written for keep even).  He had 0.5 L UOP over 10/28 charted. Pressures look stable and better today - he tells me was 833 systolic when standing.  Not documented as standing but I do see 825 systolic recorded as a BP.  Spoke with primary team and they are going to check on this.  He feels great   Review of systems:    Denies shortness of breath or chest pain  Denies n/v/d  Objective Vitals:   07/30/22 0300 07/30/22 0400 07/30/22 0401 07/30/22 0500  BP: (!) 145/62 (!) 115/50 (!) 115/50 (!) 141/64  Pulse:  72 74 78  Resp: 18 13 13 13   Temp:    98.4 F (36.9 C)  TempSrc:    Oral  SpO2:  97% 96% 93%  Weight:      Height:       Physical Exam      General adult male in bed in no acute distress HEENT normocephalic atraumatic extraocular movements intact sclera anicteric Neck supple trachea midline Lungs clear to auscultation bilaterally normal work of breathing at rest  Heart S1S2 no rub Abdomen soft nontender nondistended Extremities no edema  Psych normal mood and affect Access: RUE AVF bruit and thrill   Filed Weights   07/25/22 1852 07/27/22 0819 07/27/22 1252  Weight: 75.2 kg 76 kg 75.4 kg    Intake/Output Summary (Last 24 hours) at 07/30/2022 1023 Last data filed at 07/30/2022 0500 Gross per 24 hour  Intake 240 ml  Output 1500 ml  Net -1260 ml    Additional Objective Labs: Basic Metabolic Panel: Recent Labs  Lab 07/25/22 0619 07/27/22 0817 07/28/22 0506 07/29/22 0310  NA 137 140 134* 140  K 4.7 3.3* 4.7 4.3  CL 98 109 99 101  CO2 30 25 26 27   GLUCOSE 151* 48* 357* 79  BUN 31* 17 17 31*  CREATININE 6.02* 4.13* 4.42* 5.36*  CALCIUM 9.9 7.8* 8.9 9.5  PHOS 1.5* 1.6*  --   --    Liver Function Tests: Recent Labs  Lab 07/25/22 0619 07/27/22 0817  ALBUMIN 3.4* 2.8*    No results for input(s): "LIPASE", "AMYLASE" in the last 168 hours.  CBC: Recent Labs  Lab 07/25/22 1456 07/27/22 0816 07/28/22 0506 07/29/22 0310  WBC 7.8 6.3 6.4 7.7  HGB 8.6* 8.3* 9.7* 9.5*  HCT 26.2* 26.6* 28.8* 28.4*  MCV 89.7 93.0 89.2 90.4  PLT 294 257 255 269   Blood Culture    Component Value Date/Time   SDES  01/02/2021 1210    BLOOD LEFT HAND Performed at [facility], [address], [place], [place] [address]    [facility]  01/02/2021 1210    BOTTLES DRAWN AEROBIC AND ANAEROBIC Blood Culture adequate volume Performed at [facility], [address] [place], [place] [address]    CULT  01/02/2021 1210    NO GROWTH 5 DAYS Performed at [facility], [place] [address], [place], [place] [address]    REPTSTATUS 01/07/2021 FINAL 01/02/2021 1210    Cardiac Enzymes: No results for input(s): "CKTOTAL", "CKMB", "CKMBINDEX", "TROPONINI" in the last 168 hours. CBG: Recent Labs  Lab 07/29/22 0821 07/29/22 1436 07/29/22 1727 07/29/22 2252 07/30/22 0927  GLUCAP 127* 121* 204* 124* 202*  Iron Studies: No results for input(s): "IRON", "TIBC", "TRANSFERRIN", "FERRITIN" in the last 72 hours. Lab Results  Component Value Date   INR 0.9 07/22/2020   Studies/Results: No results found.  Medications:  anticoagulant sodium citrate      aspirin EC  81 mg Oral q morning   atorvastatin  80 mg Oral Daily   bisacodyl  10 mg Rectal Daily   calcitRIOL  0.5 mcg Oral Q T,Th,Sa-HD   Chlorhexidine Gluconate Cloth  6 each Topical Q0600   Chlorhexidine Gluconate Cloth  6 each Topical Q0600   clopidogrel  75 mg Oral q morning   [START ON 08/03/2022] darbepoetin (ARANESP) injection - DIALYSIS  100 mcg Subcutaneous Q Thu-1800   docusate sodium  200 mg Oral BID   heparin  5,000 Units Subcutaneous Q8H   insulin aspart  0-9 Units Subcutaneous TID WC   insulin glargine-yfgn  16 Units Subcutaneous Daily   metoCLOPramide  5 mg Oral TID AC & HS    midodrine  2.5 mg Oral TID WC   pantoprazole  40 mg Oral BID   polyethylene glycol  17 g Oral BID   sertraline  50 mg Oral Daily    Dialysis Orders: TTS [facility] 4h  400/1.5  75kg  2/2 bath  Hep none RUA AVF - last HD 10/7, post wt 76.1 - last Hb 10.4 on 10/5 - mircera 120 ug q 4 wks, last  9/14 > due 10/12 thurs - venofer 50 mg weekly - calcitriol 0.5 ug tiw po tts   Home meds include - amlodipine 10, aspirin, atovastatin, clopidogrel, humalog insulin/ degludec, hydralazine 50 tid, hctz 25 qd, labetalol 200 bid, pantoprazole, sevelamer carb 800 tid, prns/ vits/ supps   Assessment/Plan: ESRD - HD TTS.  he last dialyzed here on 10/28 and fluid was removed despite my order to keep even.  Will need to raise EDW to 76 kg.  Will need precautions with HD for shingles  Shingles - new Dx.  Reporting minimal pain.  Will need precautions w/HD. Renally dosed medications.  Nausea/vomiting - new Dx gastroparesis. CT abdomen +constipation, no acute abnormalities. Per PMD. Now on reglan and antiemetics. resolved Orthostatic hypotension/ volume- note long-standing DM which is felt to be contributing. noted near syncopal episodes, all BP meds stopped - amlodipine, hydralazine, HCTZ per charting, and labetalol. Continue compression stockings and abdominal binder.  He has intermittently gotten fluids and has intermittently had fluid removed with HD in error.  Had fluid removed last HD on 10/28.  I have removed fluid restrictions from diet.  I started back midodrine 2.5 mg TID (was at 580 systolic or more on the 5 mg dose).  We raised EDW by 1 kg.  Concerning given such a significant change in his medication requirement.  Appreciate primary team looking into other etiologies  DKA/DM1 BS 1061 at admit.  Per primary team  Anemia esrd ESA recently started here.  Aranesp 100 q. Thursday.  Anticipate need to increase dose  MBD ckd -continue calcitriol.  hypophosphatemia - stopped [place] (new med) and  repleted  Disposition - Per primary team. Ok for discharge from a renal standpoint if orthostatics today acceptable - pt tells me was 998 systolic and team is going to look into this documentation.    Claudia Desanctis, MD [facility] 07/30/2022,10:49 AM

## 2022-07-30 NOTE — Discharge Summary (Signed)
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 Patient reported that he developed progressively worsening nausea and vomiting in the last 24 hours, unable to hold anything down. Patient was admitted, treated for DKA, transitioned to subcu insulin with blood sugar stabilized. He had near-syncopal syncopal episode with orthostatic hypotension on 07/26/22- so discharge was held fluids were given.  Patient was further stabilized, BP meds held and patient was started on midodrine.  Patient advised to follow-up with PCP as well as dialysis for further BP evaluation and medication adjustments.    Assessment and Plan:  Orthostatic hypotension Hypertension-Labile BP He had near syncopal episode 10/25 discharge was held>antihypertensives amlodipine hydralazine and HCTZ has been discontinued, on compression stocking and abdominal binder> given IVFs. Nephrology on board, recommended holding all BP meds and starting midodrine. Last orthostatic vitals fairly stable, with patient completely asymptomatic Florinef will not be effective given his ESRD Follow-up with PCP/nephrology for further BP management and use of midodrine   Diabetes mellitus with DKA on presentation DKA resolved. Hypoglycemia:After resolution of the patient's DKA, he had several episodes of hypoglycemia, insulin  adjusted while in the hospital Diabetic gastroparesis new diagnosis-severely delayed gastric emptying study. Cont Reglan 5 mg tid, PPI. Hypokalemia resolved  HLD: Cont his  Aspirin, Plavix and statin. ESRD on HD Anemia of chronic disease Metabolic bone disease continue patient's calcitriol, monitor Phos calcium Shingles S/P Valtrex. Rash is crusting       Consultants: Nephrology Procedures performed: None Disposition: Home Diet recommendation:  Discharge Diet Orders (From admission, onward)     Start     Ordered   07/12/22 0000  Diet Carb Modified        07/12/22 1233           Renal diet   DISCHARGE MEDICATION: Allergies as of 07/30/2022       Reactions   Fexofenadine Rash   Latex Rash   Chlorhexidine Gluconate Other (See Comments)   Blisters        Medication List     STOP taking these medications    amLODipine 10 MG tablet Commonly known as: NORVASC   hydrALAZINE 50 MG tablet Commonly known as: APRESOLINE   hydrochlorothiazide 25 MG tablet Commonly known as: HYDRODIURIL   labetalol 200 MG tablet Commonly known as: NORMODYNE       TAKE these medications    aspirin EC 81 MG tablet Take 1 tablet (81 mg total) by mouth daily. Swallow whole. What changed: when to take this   atorvastatin 80 MG tablet Commonly known as: LIPITOR Take 1 tablet (80 mg total) by mouth daily. What changed: when to take this   Baqsimi One Pack 3 MG/DOSE Powd Generic drug: Glucagon Place 3 mg into the nose once as needed (low blood sugar).   calcitRIOL 0.5 MCG capsule Commonly known as: ROCALTROL Take 1 capsule (0.5 mcg total) by mouth Every  Tuesday,Thursday,and Saturday with dialysis.   clopidogrel 75 MG tablet Commonly known as: PLAVIX Take 75 mg by mouth every morning.   FreeStyle Libre 2 Reader [employer] 1 box Diagnosis: Diabetes mellitus type 1, IDDM What changed:  how to take this additional instructions   FreeStyle Libre 2 Sensor Misc Diagnosis:  Diabetes mellitus type 1, IDDM What changed:  how to take this additional instructions   HumaLOG KwikPen 100 UNIT/ML KwikPen Generic drug: insulin lispro Inject 5 Units into the skin 3 (three) times daily. What changed: Another medication with the same name was added. Make sure you understand how and when to take each.   insulin lispro 100 UNIT/ML KwikPen Commonly known as: HUMALOG CORRECTION FACTOR: Sliding scale CBG 70 - 120: 0 units CBG 121 - 150: 0 unit,  CBG 151 - 200: 1 units,  CBG 201 - 250: 2 units,  CBG 251 - 300: 2 units,  CBG 301 - 350: 4 units,  CBG 351 - 400: 5 units   CBG > 400: 6 units and notify your MD What changed: You were already taking a medication with the same name, and this prescription was added. Make sure you understand how and when to take each.   insulin degludec 100 UNIT/ML FlexTouch Pen Commonly known as: TRESIBA Inject 15 Units into the skin at bedtime. What changed: how much to take   Insulin Pen Needle 32G X 4 MM Misc Use with lantus and humalog 4 imes per day   metoCLOPramide 5 MG tablet Commonly known as: REGLAN Take 1 tablet (5 mg total) by mouth 4 (four) times daily -  before meals and at bedtime for 14 days.   midodrine 2.5 MG tablet Commonly known as: PROAMATINE Take 1 tablet (2.5 mg total) by mouth 3 (three) times daily with meals.   ondansetron 4 MG disintegrating tablet Commonly known as: ZOFRAN-ODT Take 1 tablet (4 mg total) by mouth every 8 (eight) hours as needed for nausea or vomiting.   ONE TOUCH LANCETS Misc Use to check blood sugar 8 time(s) daily   OneTouch Verio Flex System w/Device Kit by Does not apply route.   OneTouch Verio test strip Generic drug: glucose blood SMARTSIG:Via Meter 8 Times Daily   pantoprazole 40 MG tablet Commonly known as: Protonix Take 1 tablet (40 mg total) by mouth daily before breakfast. What changed: when to take this   Retacrit 10000 UNIT/ML injection Generic drug: epoetin  alfa-epbx Inject 10,000 Units into the vein every 30 (thirty) days.   sertraline 50 MG tablet Commonly known as: ZOLOFT Take 1 tablet (50 mg total) by mouth daily.   sevelamer carbonate 800 MG tablet Commonly known as: RENVELA Take 800 mg by mouth 3 (three) times daily.   Vitamin D 50 MCG (2000 UT) tablet Take 2,000 Units by mouth daily.        Follow-up Information     Bernerd Limbo, MD. Schedule an appointment as soon as possible for a visit in 5 day(s).   Specialty: Family Medicine Why: for hospital follow-up Contact information: [address] [place] [address] [phone]         Gerome Apley, MD. Schedule an appointment as soon as possible for a visit in 10 day(s).   Specialty: Endocrinology Why: for hospital follow-up for diabetes Contact information: [place] [id number] [phone]                Discharge Exam: Danley Danker Weights  07/25/22 1852 07/27/22 0819 07/27/22 1252  Weight: 75.2 kg 76 kg 75.4 kg   General: NAD  Cardiovascular: S1, S2 present Respiratory: CTAB Abdomen: Soft, nontender, nondistended, bowel sounds present Musculoskeletal: No bilateral pedal edema noted Skin: Shingles rash noted to be crusted Psychiatry: Normal mood   Condition at discharge: stable  The results of significant diagnostics from this hospitalization (including imaging, microbiology, ancillary and laboratory) are listed below for reference.   Imaging Studies: NM GASTRIC EMPTYING  Result Date: 07/17/2022 CLINICAL DATA:  Diabetes and end-stage renal disease. Nausea and vomiting with epigastric abdominal pain. EXAM: NUCLEAR MEDICINE GASTRIC EMPTYING SCAN TECHNIQUE: After oral ingestion of radiolabeled meal, sequential abdominal images were obtained for 4 hours. Percentage of activity emptying the stomach was calculated at 1 hour, 2 hour, 3 hour, and 4 hours. RADIOPHARMACEUTICALS:  2.0 mCi Tc-54min  standardized meal consisting of scrambled eggs and water COMPARISON:  07/17/2022 CT scan FINDINGS: Expected location of the stomach in the left upper quadrant. Ingested meal empties the stomach poorly over the course of the study. 10 emptied at 1 hr ( normal >= 10%) 10 emptied at 2 hr ( normal >= 40%) 11 emptied at 3 hr ( normal >= 70%) 11 emptied at 4 hr ( normal >= 90%) IMPRESSION: Substantially delayed gastric emptying with only 11% emptying at 4 hours. Electronically Signed   By: WVan ClinesM.D.   On: 07/17/2022 17:34   CT ABDOMEN PELVIS WO CONTRAST  Result Date: 07/17/2022 CLINICAL DATA:  Abdominal pain. Nausea and vomiting. Epigastric pain. EXAM: CT ABDOMEN AND PELVIS WITHOUT CONTRAST TECHNIQUE: Multidetector CT imaging of the abdomen and pelvis was performed following the standard protocol without IV contrast. RADIATION DOSE REDUCTION: This exam was performed according to the departmental dose-optimization program which includes automated exposure control, adjustment of the mA and/or kV according to patient size and/or use of iterative reconstruction technique. COMPARISON:  04/12/2021 FINDINGS: Lower chest: No acute abnormality. Hepatobiliary: No focal liver abnormality is seen. No gallstones, gallbladder wall thickening, or biliary dilatation. Pancreas: Unremarkable. No pancreatic ductal dilatation or surrounding inflammatory changes. Spleen: Normal in size without focal abnormality. Adrenals/Urinary Tract: Adrenal glands are unremarkable. Kidneys are normal, without renal calculi, focal lesion, or hydronephrosis. Bladder is unremarkable. Stomach/Bowel: Stomach is within normal limits. No evidence of bowel wall thickening, distention, or inflammatory changes. Moderate amount of stool throughout the colon. Small hiatal hernia. Vascular/Lymphatic: Normal caliber abdominal aorta with mild atherosclerosis. No lymphadenopathy. Reproductive: Prostate is unremarkable. Other: No abdominal wall hernia or  abnormality. No abdominopelvic ascites. Focal areas of soft tissue nodularity and edema in the subcutaneous fat of the anterior abdominal wall likely reflecting sequela of subcutaneous injections. Musculoskeletal: No acute osseous abnormality. No aggressive osseous lesion. Chronic T12 vertebral body compression fracture. IMPRESSION: 1. No acute abdominal or pelvic pathology. 2. Moderate amount of stool throughout the colon. 3.  Aortic Atherosclerosis (ICD10-I70.0). Electronically Signed   By: HKathreen DevoidM.D.   On: 07/17/2022 08:56   DG Abd Portable 1V  Result Date: 07/16/2022 CLINICAL DATA:  Nausea. EXAM: PORTABLE ABDOMEN - 1 VIEW COMPARISON:  None Available. FINDINGS: The bowel gas pattern is normal. A small to moderate amount of stool within the colon and rectum noted. No radio-opaque calculi or other significant radiographic abnormality are seen. IMPRESSION: 1. No acute abnormalities. 2. Small to moderate amount of colonic/rectal stool. Electronically Signed   By: JMargarette CanadaM.D.   On: 07/16/2022 12:14   DG Chest 1 View  Result Date: 07/10/2022 CLINICAL  DATA:  Hyperglycemia.  Vomiting since yesterday. EXAM: CHEST  1 VIEW COMPARISON:  06/05/2022 FINDINGS: Normal heart size and pulmonary vascularity. No focal airspace disease or consolidation in the lungs. No blunting of costophrenic angles. No pneumothorax. Mediastinal contours appear intact. Calcification of the aorta. Old left rib fractures. IMPRESSION: No active disease. Electronically Signed   By: [name] M.D.   On: 07/10/2022 23:20    Microbiology: Results for orders placed or performed during the hospital encounter of 06/05/22  Resp Panel by RT-PCR (Flu A&B, Covid) Anterior Nasal Swab     Status: None   Collection Time: 06/05/22 10:08 AM   Specimen: Anterior Nasal Swab  Result Value Ref Range Status   SARS Coronavirus 2 by RT PCR NEGATIVE NEGATIVE Final    Comment: (NOTE) SARS-CoV-2 target nucleic acids are NOT DETECTED.  The  SARS-CoV-2 RNA is generally detectable in upper respiratory specimens during the acute phase of infection. The lowest concentration of SARS-CoV-2 viral copies this assay can detect is 138 copies/mL. A negative result does not preclude SARS-Cov-2 infection and should not be used as the sole basis for treatment or other patient management decisions. A negative result may occur with  improper specimen collection/handling, submission of specimen other than nasopharyngeal swab, presence of viral mutation(s) within the areas targeted by this assay, and inadequate number of viral copies(<138 copies/mL). A negative result must be combined with clinical observations, patient history, and epidemiological information. The expected result is Negative.  Fact Sheet for Patients:  EntrepreneurPulse.com.au  Fact Sheet for Healthcare Providers:  IncredibleEmployment.be  This test is no t yet approved or cleared by the [place] FDA and  has been authorized for detection and/or diagnosis of SARS-CoV-2 by FDA under an Emergency Use Authorization (EUA). This EUA will remain  in effect (meaning this test can be used) for the duration of the COVID-19 declaration under Section 564(b)(1) of the Act, 21 U.S.C.section 360bbb-3(b)(1), unless the authorization is terminated  or revoked sooner.       Influenza A by PCR NEGATIVE NEGATIVE Final   Influenza B by PCR NEGATIVE NEGATIVE Final    Comment: (NOTE) The Xpert Xpress SARS-CoV-2/FLU/RSV plus assay is intended as an aid in the diagnosis of influenza from Nasopharyngeal swab specimens and should not be used as a sole basis for treatment. Nasal washings and aspirates are unacceptable for Xpert Xpress SARS-CoV-2/FLU/RSV testing.  Fact Sheet for Patients: EntrepreneurPulse.com.au  Fact Sheet for Healthcare Providers: IncredibleEmployment.be  This test is not yet approved or  cleared by the [place] FDA and has been authorized for detection and/or diagnosis of SARS-CoV-2 by FDA under an Emergency Use Authorization (EUA). This EUA will remain in effect (meaning this test can be used) for the duration of the COVID-19 declaration under Section 564(b)(1) of the Act, 21 U.S.C. section 360bbb-3(b)(1), unless the authorization is terminated or revoked.  Performed at [facility], [place] [address] [place], [place] [address]     Labs: CBC: Recent Labs  Lab 07/25/22 1456 07/27/22 0816 07/28/22 0506 07/29/22 0310  WBC 7.8 6.3 6.4 7.7  HGB 8.6* 8.3* 9.7* 9.5*  HCT 26.2* 26.6* 28.8* 28.4*  MCV 89.7 93.0 89.2 90.4  PLT 294 257 255 503   Basic Metabolic Panel: Recent Labs  Lab 07/25/22 0619 07/27/22 0817 07/28/22 0506 07/29/22 0310  NA 137 140 134* 140  K 4.7 3.3* 4.7 4.3  CL 98 109 99 101  CO2 _0 GLUCOSE 151* 48* 357* 79  BUN 31* 17 17 31*  CREATININE 6.02* 4.13* 4.42* 5.36*  CALCIUM 9.9 7.8* 8.9 9.5  PHOS 1.5* 1.6*  --   --    Liver Function Tests: Recent Labs  Lab 07/25/22 0619 07/27/22 0817  ALBUMIN 3.4* 2.8*   CBG: Recent Labs  Lab 07/29/22 1727 07/29/22 2252 07/30/22 0927 07/30/22 1117 07/30/22 1553  GLUCAP 204* 124* 202* 329* 257*    Discharge time spent: greater than 30 minutes.  Signed: Alma Friendly, MD [facility] 07/30/2022

## 2022-07-31 NOTE — Progress Notes (Signed)
Late Entry Note:  Contacted [facility] this morning to advise clinic of pt's d/c yesterday and that pt will resume care tomorrow.   Melven Sartorius Renal Navigator [phone]

## 2022-08-04 NOTE — Progress Notes (Signed)
Chart accessed d/t receiving an email requiring an action r/t HEP B compliance documentation

## 2022-08-14 DIAGNOSIS — I739 Peripheral vascular disease, unspecified: Secondary | ICD-10-CM

## 2022-08-14 HISTORY — DX: Peripheral vascular disease, unspecified: I73.9

## 2022-08-26 ENCOUNTER — Other Ambulatory Visit: Payer: Self-pay

## 2022-08-26 ENCOUNTER — Encounter (HOSPITAL_COMMUNITY): Payer: Self-pay | Admitting: *Deleted

## 2022-08-26 ENCOUNTER — Observation Stay (HOSPITAL_COMMUNITY)
Admission: EM | Admit: 2022-08-26 | Discharge: 2022-08-27 | Disposition: A | Discharge status: Home / Self Care | Payer: Medicare Other | Attending: Internal Medicine | Admitting: Internal Medicine

## 2022-08-26 ENCOUNTER — Emergency Department (HOSPITAL_COMMUNITY): Payer: Medicare Other

## 2022-08-26 DIAGNOSIS — Z794 Long term (current) use of insulin: Secondary | ICD-10-CM | POA: Diagnosis not present

## 2022-08-26 DIAGNOSIS — E103599 Type 1 diabetes mellitus with proliferative diabetic retinopathy without macular edema, unspecified eye: Secondary | ICD-10-CM | POA: Diagnosis not present

## 2022-08-26 DIAGNOSIS — Z79899 Other long term (current) drug therapy: Secondary | ICD-10-CM | POA: Insufficient documentation

## 2022-08-26 DIAGNOSIS — I2510 Atherosclerotic heart disease of native coronary artery without angina pectoris: Secondary | ICD-10-CM | POA: Insufficient documentation

## 2022-08-26 DIAGNOSIS — E1010 Type 1 diabetes mellitus with ketoacidosis without coma: Principal | ICD-10-CM | POA: Insufficient documentation

## 2022-08-26 DIAGNOSIS — Z7982 Long term (current) use of aspirin: Secondary | ICD-10-CM | POA: Diagnosis not present

## 2022-08-26 DIAGNOSIS — E875 Hyperkalemia: Secondary | ICD-10-CM | POA: Diagnosis present

## 2022-08-26 DIAGNOSIS — Z7951 Long term (current) use of inhaled steroids: Secondary | ICD-10-CM | POA: Diagnosis not present

## 2022-08-26 DIAGNOSIS — E1022 Type 1 diabetes mellitus with diabetic chronic kidney disease: Secondary | ICD-10-CM | POA: Insufficient documentation

## 2022-08-26 DIAGNOSIS — I10 Essential (primary) hypertension: Secondary | ICD-10-CM | POA: Diagnosis present

## 2022-08-26 DIAGNOSIS — Z91040 Latex allergy status: Secondary | ICD-10-CM | POA: Insufficient documentation

## 2022-08-26 DIAGNOSIS — R112 Nausea with vomiting, unspecified: Secondary | ICD-10-CM | POA: Diagnosis present

## 2022-08-26 DIAGNOSIS — N186 End stage renal disease: Secondary | ICD-10-CM | POA: Diagnosis present

## 2022-08-26 DIAGNOSIS — E7800 Pure hypercholesterolemia, unspecified: Secondary | ICD-10-CM | POA: Diagnosis present

## 2022-08-26 DIAGNOSIS — E1110 Type 2 diabetes mellitus with ketoacidosis without coma: Secondary | ICD-10-CM | POA: Diagnosis present

## 2022-08-26 LAB — CBG MONITORING, ED
Glucose-Capillary: >600 mg/dL (ref 70–99)
Glucose-Capillary: >600 mg/dL (ref 70–99)
Glucose-Capillary: >600 mg/dL (ref 70–99)
Glucose-Capillary: >600 mg/dL (ref 70–99)
Glucose-Capillary: >600 mg/dL (ref 70–99)
Glucose-Capillary: >600 mg/dL (ref 70–99)
Glucose-Capillary: 597 mg/dL (ref 70–99)
Glucose-Capillary: >600 mg/dL (ref 70–99)
Glucose-Capillary: 527 mg/dL (ref 70–99)
Glucose-Capillary: 425 mg/dL — ABNORMAL HIGH (ref 70–99)
Glucose-Capillary: 347 mg/dL — ABNORMAL HIGH (ref 70–99)
Glucose-Capillary: 199 mg/dL — ABNORMAL HIGH (ref 70–99)
Glucose-Capillary: 161 mg/dL — ABNORMAL HIGH (ref 70–99)
Glucose-Capillary: 122 mg/dL — ABNORMAL HIGH (ref 70–99)
Glucose-Capillary: 115 mg/dL — ABNORMAL HIGH (ref 70–99)
Glucose-Capillary: 124 mg/dL — ABNORMAL HIGH (ref 70–99)
Glucose-Capillary: 138 mg/dL — ABNORMAL HIGH (ref 70–99)

## 2022-08-26 LAB — RENAL FUNCTION PANEL
Albumin: 3.6 g/dL (ref 3.5–5.0)
Anion gap: 17 — ABNORMAL HIGH (ref 5–15)
BUN: 49 mg/dL — ABNORMAL HIGH (ref 6–20)
CO2: 28 mmol/L (ref 22–32)
Calcium: 9.4 mg/dL (ref 8.9–10.3)
Chloride: 94 mmol/L — ABNORMAL LOW (ref 98–111)
Creatinine, Ser: 5.25 mg/dL — ABNORMAL HIGH (ref 0.61–1.24)
GFR, Estimated: 12 mL/min — ABNORMAL LOW (ref >60)
Glucose, Bld: 156 mg/dL — ABNORMAL HIGH (ref 70–99)
Phosphorus: 3.6 mg/dL (ref 2.5–4.6)
Potassium: 3.9 mmol/L (ref 3.5–5.1)
Sodium: 139 mmol/L (ref 135–145)

## 2022-08-26 LAB — BASIC METABOLIC PANEL
Anion gap: 31 — ABNORMAL HIGH (ref 5–15)
Anion gap: 20 — ABNORMAL HIGH (ref 5–15)
Anion gap: 16 — ABNORMAL HIGH (ref 5–15)
BUN: 90 mg/dL — ABNORMAL HIGH (ref 6–20)
BUN: 91 mg/dL — ABNORMAL HIGH (ref 6–20)
BUN: 35 mg/dL — ABNORMAL HIGH (ref 6–20)
CO2: 16 mmol/L — ABNORMAL LOW (ref 22–32)
CO2: 23 mmol/L (ref 22–32)
CO2: 25 mmol/L (ref 22–32)
Calcium: 9.6 mg/dL (ref 8.9–10.3)
Calcium: 9.7 mg/dL (ref 8.9–10.3)
Calcium: 8.7 mg/dL — ABNORMAL LOW (ref 8.9–10.3)
Chloride: 84 mmol/L — ABNORMAL LOW (ref 98–111)
Chloride: 90 mmol/L — ABNORMAL LOW (ref 98–111)
Chloride: 97 mmol/L — ABNORMAL LOW (ref 98–111)
Creatinine, Ser: 9.53 mg/dL — ABNORMAL HIGH (ref 0.61–1.24)
Creatinine, Ser: 9.07 mg/dL — ABNORMAL HIGH (ref 0.61–1.24)
Creatinine, Ser: 4.57 mg/dL — ABNORMAL HIGH (ref 0.61–1.24)
GFR, Estimated: 6 mL/min — ABNORMAL LOW (ref >60)
GFR, Estimated: 6 mL/min — ABNORMAL LOW (ref >60)
GFR, Estimated: 14 mL/min — ABNORMAL LOW (ref >60)
Glucose, Bld: 883 mg/dL (ref 70–99)
Glucose, Bld: 439 mg/dL — ABNORMAL HIGH (ref 70–99)
Glucose, Bld: 140 mg/dL — ABNORMAL HIGH (ref 70–99)
Potassium: 6.2 mmol/L — ABNORMAL HIGH (ref 3.5–5.1)
Potassium: 4.8 mmol/L (ref 3.5–5.1)
Potassium: 3.7 mmol/L (ref 3.5–5.1)
Sodium: 131 mmol/L — ABNORMAL LOW (ref 135–145)
Sodium: 133 mmol/L — ABNORMAL LOW (ref 135–145)
Sodium: 138 mmol/L (ref 135–145)

## 2022-08-26 LAB — COMPREHENSIVE METABOLIC PANEL
ALT: 31 U/L (ref 0–44)
AST: 32 U/L (ref 15–41)
Albumin: 4 g/dL (ref 3.5–5.0)
Alkaline Phosphatase: 92 U/L (ref 38–126)
Anion gap: 31 — ABNORMAL HIGH (ref 5–15)
BUN: 87 mg/dL — ABNORMAL HIGH (ref 6–20)
CO2: 13 mmol/L — ABNORMAL LOW (ref 22–32)
Calcium: 9.7 mg/dL (ref 8.9–10.3)
Chloride: 87 mmol/L — ABNORMAL LOW (ref 98–111)
Creatinine, Ser: 9.03 mg/dL — ABNORMAL HIGH (ref 0.61–1.24)
GFR, Estimated: 6 mL/min — ABNORMAL LOW (ref >60)
Glucose, Bld: 878 mg/dL (ref 70–99)
Potassium: 6.5 mmol/L (ref 3.5–5.1)
Sodium: 131 mmol/L — ABNORMAL LOW (ref 135–145)
Total Bilirubin: 1.2 mg/dL (ref 0.3–1.2)
Total Protein: 7.1 g/dL (ref 6.5–8.1)

## 2022-08-26 LAB — HEPATITIS B SURFACE ANTIGEN: Hepatitis B Surface Ag: NONREACTIVE

## 2022-08-26 LAB — I-STAT VENOUS BLOOD GAS, ED
Acid-base deficit: 9 mmol/L — ABNORMAL HIGH (ref 0.0–2.0)
Bicarbonate: 17.2 mmol/L — ABNORMAL LOW (ref 20.0–28.0)
Calcium, Ion: 1.09 mmol/L — ABNORMAL LOW (ref 1.15–1.40)
HCT: 35 % — ABNORMAL LOW (ref 39.0–52.0)
Hemoglobin: 11.9 g/dL — ABNORMAL LOW (ref 13.0–17.0)
O2 Saturation: 68 %
Potassium: 6 mmol/L — ABNORMAL HIGH (ref 3.5–5.1)
Sodium: 127 mmol/L — ABNORMAL LOW (ref 135–145)
TCO2: 18 mmol/L — ABNORMAL LOW (ref 22–32)
pCO2, Ven: 39.4 mmHg — ABNORMAL LOW (ref 44–60)
pH, Ven: 7.249 — ABNORMAL LOW (ref 7.25–7.43)
pO2, Ven: 41 mmHg (ref 32–45)

## 2022-08-26 LAB — CBC
HCT: 37 % — ABNORMAL LOW (ref 39.0–52.0)
Hemoglobin: 11 g/dL — ABNORMAL LOW (ref 13.0–17.0)
MCH: 30.5 pg (ref 26.0–34.0)
MCHC: 29.7 g/dL — ABNORMAL LOW (ref 30.0–36.0)
MCV: 102.5 fL — ABNORMAL HIGH (ref 80.0–100.0)
Platelets: 364 10*3/uL (ref 150–400)
RBC: 3.61 MIL/uL — ABNORMAL LOW (ref 4.22–5.81)
RDW: 15.7 % — ABNORMAL HIGH (ref 11.5–15.5)
WBC: 15.7 10*3/uL — ABNORMAL HIGH (ref 4.0–10.5)
nRBC: 0 % (ref 0.0–0.2)

## 2022-08-26 LAB — BETA-HYDROXYBUTYRIC ACID
Beta-Hydroxybutyric Acid: 5.53 mmol/L — ABNORMAL HIGH (ref 0.05–0.27)
Beta-Hydroxybutyric Acid: 0.34 mmol/L — ABNORMAL HIGH (ref 0.05–0.27)

## 2022-08-26 MED ORDER — LACTATED RINGERS IV SOLN: INTRAVENOUS | Status: DC

## 2022-08-26 MED ORDER — HYDROXYZINE HCL 25 MG PO TABS: 25.0000 mg | ORAL_TABLET | Freq: Three times a day (TID) | ORAL | Status: DC | PRN

## 2022-08-26 MED ORDER — LACTATED RINGERS IV BOLUS
20.0000 mL/kg | Freq: Once | INTRAVENOUS | Status: AC
  Administered 2022-08-26: 1508 mL via INTRAVENOUS

## 2022-08-26 MED ORDER — INSULIN REGULAR(HUMAN) IN NACL 100-0.9 UT/100ML-% IV SOLN
INTRAVENOUS | Status: DC
  Administered 2022-08-26: 6.5 [IU]/h via INTRAVENOUS
  Filled 2022-08-26: qty 100

## 2022-08-26 MED ORDER — SERTRALINE HCL 50 MG PO TABS
50.0000 mg | ORAL_TABLET | Freq: Every day | ORAL | Status: DC
  Administered 2022-08-26 – 2022-08-27 (×2): 50 mg via ORAL
  Filled 2022-08-26 (×2): qty 1

## 2022-08-26 MED ORDER — INSULIN REGULAR(HUMAN) IN NACL 100-0.9 UT/100ML-% IV SOLN
INTRAVENOUS | Status: DC
  Administered 2022-08-26: 0.5 [IU]/h via INTRAVENOUS

## 2022-08-26 MED ORDER — INSULIN GLARGINE-YFGN 100 UNIT/ML ~~LOC~~ SOLN
10.0000 [IU] | Freq: Every day | SUBCUTANEOUS | Status: DC
  Administered 2022-08-26: 10 [IU] via SUBCUTANEOUS
  Filled 2022-08-26 (×2): qty 0.1

## 2022-08-26 MED ORDER — CALCIUM CARBONATE ANTACID 1250 MG/5ML PO SUSP: 500.0000 mg | Freq: Four times a day (QID) | ORAL | Status: DC | PRN

## 2022-08-26 MED ORDER — HYDRALAZINE HCL 20 MG/ML IJ SOLN: 5.0000 mg | INTRAMUSCULAR | Status: DC | PRN

## 2022-08-26 MED ORDER — ASPIRIN 81 MG PO TBEC
81.0000 mg | DELAYED_RELEASE_TABLET | Freq: Every morning | ORAL | Status: DC
  Administered 2022-08-26 – 2022-08-27 (×2): 81 mg via ORAL
  Filled 2022-08-26 (×2): qty 1

## 2022-08-26 MED ORDER — ACETAMINOPHEN 650 MG RE SUPP: 650.0000 mg | Freq: Four times a day (QID) | RECTAL | Status: DC | PRN

## 2022-08-26 MED ORDER — SEVELAMER CARBONATE 800 MG PO TABS
800.0000 mg | ORAL_TABLET | Freq: Three times a day (TID) | ORAL | Status: DC
  Administered 2022-08-27: 800 mg via ORAL
  Filled 2022-08-26: qty 1

## 2022-08-26 MED ORDER — CAMPHOR-MENTHOL 0.5-0.5 % EX LOTN: 1.0000 | TOPICAL_LOTION | Freq: Three times a day (TID) | CUTANEOUS | Status: DC | PRN

## 2022-08-26 MED ORDER — PANTOPRAZOLE SODIUM 40 MG PO TBEC
40.0000 mg | DELAYED_RELEASE_TABLET | Freq: Every day | ORAL | Status: DC
  Administered 2022-08-27: 40 mg via ORAL
  Filled 2022-08-26: qty 1

## 2022-08-26 MED ORDER — ONDANSETRON HCL 4 MG/2ML IJ SOLN: 4.0000 mg | Freq: Four times a day (QID) | INTRAMUSCULAR | Status: DC | PRN

## 2022-08-26 MED ORDER — HEPARIN SODIUM (PORCINE) 5000 UNIT/ML IJ SOLN
5000.0000 [IU] | Freq: Three times a day (TID) | INTRAMUSCULAR | Status: DC
  Administered 2022-08-26 – 2022-08-27 (×2): 5000 [IU] via SUBCUTANEOUS
  Filled 2022-08-26 (×3): qty 1

## 2022-08-26 MED ORDER — CALCITRIOL 0.5 MCG PO CAPS
0.5000 ug | ORAL_CAPSULE | ORAL | Status: DC
  Administered 2022-08-26: 0.5 ug via ORAL
  Filled 2022-08-26: qty 1

## 2022-08-26 MED ORDER — INSULIN ASPART 100 UNIT/ML IJ SOLN
0.0000 [IU] | Freq: Three times a day (TID) | INTRAMUSCULAR | Status: DC
  Administered 2022-08-27: 1 [IU] via SUBCUTANEOUS

## 2022-08-26 MED ORDER — MIDODRINE HCL 5 MG PO TABS
2.5000 mg | ORAL_TABLET | Freq: Three times a day (TID) | ORAL | Status: DC
  Administered 2022-08-27: 2.5 mg via ORAL
  Filled 2022-08-26: qty 1

## 2022-08-26 MED ORDER — ZOLPIDEM TARTRATE 5 MG PO TABS: 5.0000 mg | ORAL_TABLET | Freq: Every evening | ORAL | Status: DC | PRN

## 2022-08-26 MED ORDER — NEPRO/CARBSTEADY PO LIQD: 237.0000 mL | Freq: Three times a day (TID) | ORAL | Status: DC | PRN

## 2022-08-26 MED ORDER — ALBUTEROL SULFATE (2.5 MG/3ML) 0.083% IN NEBU
2.5000 mg | INHALATION_SOLUTION | Freq: Once | RESPIRATORY_TRACT | Status: AC
  Administered 2022-08-26: 2.5 mg via RESPIRATORY_TRACT
  Filled 2022-08-26: qty 3

## 2022-08-26 MED ORDER — ONDANSETRON HCL 4 MG PO TABS: 4.0000 mg | ORAL_TABLET | Freq: Four times a day (QID) | ORAL | Status: DC | PRN

## 2022-08-26 MED ORDER — INSULIN ASPART 100 UNIT/ML IJ SOLN: 0.0000 [IU] | Freq: Every day | INTRAMUSCULAR | Status: DC

## 2022-08-26 MED ORDER — ONDANSETRON 4 MG PO TBDP
4.0000 mg | ORAL_TABLET | Freq: Once | ORAL | Status: AC | PRN
  Administered 2022-08-26: 4 mg via ORAL
  Filled 2022-08-26: qty 1

## 2022-08-26 MED ORDER — DEXTROSE IN LACTATED RINGERS 5 % IV SOLN: INTRAVENOUS | Status: DC

## 2022-08-26 MED ORDER — INSULIN ASPART 100 UNIT/ML IJ SOLN
3.0000 [IU] | Freq: Three times a day (TID) | INTRAMUSCULAR | Status: DC
  Administered 2022-08-27: 3 [IU] via SUBCUTANEOUS

## 2022-08-26 MED ORDER — DEXTROSE 50 % IV SOLN: 0.0000 mL | INTRAVENOUS | Status: DC | PRN

## 2022-08-26 MED ORDER — DOCUSATE SODIUM 283 MG RE ENEM: 1.0000 | ENEMA | RECTAL | Status: DC | PRN

## 2022-08-26 MED ORDER — SORBITOL 70 % SOLN: 30.0000 mL | Status: DC | PRN

## 2022-08-26 MED ORDER — ACETAMINOPHEN 325 MG PO TABS: 650.0000 mg | ORAL_TABLET | Freq: Four times a day (QID) | ORAL | Status: DC | PRN

## 2022-08-26 MED ORDER — SODIUM CHLORIDE 0.9% FLUSH
3.0000 mL | Freq: Two times a day (BID) | INTRAVENOUS | Status: DC
  Administered 2022-08-26 – 2022-08-27 (×2): 3 mL via INTRAVENOUS

## 2022-08-26 NOTE — ED Notes (Signed)
I was unable to obtain pt's bw. I notified phlebotomy and she stated she will try

## 2022-08-26 NOTE — Progress Notes (Signed)
Patient need a repeat RFP at start of HD. Results K+ is 4.8 now.

## 2022-08-26 NOTE — H&P (Signed)
History and Physical    Patient: Louis Peters POE:[id number] DOB: 08-01-1966 DOA: 08/26/2022 DOS: the patient was seen and examined on 08/26/2022 PCP: Bernerd Limbo, MD  Patient coming from: Home - lives with wife and 2 sons; NOK: Wife, [phone]   Chief Complaint: n/v  HPI: Louis Peters is a 56 y.o. male with medical history significant of ESRD on TTS HD; type 1 DM; HTN; and HLD presenting with n/v.  He was last admitted from 10/9-29 for DKA and then orthostatic hypotension.  He was discharged with compression stockings and an abdominal binder as well as midodrine.  He reports that he has been having to get fluid and insulin.  Glucose was 1200 last month, runs 800-900 at baseline.  He started with n/v Friday.  Emesis appears to have resolved.      ER Course:  HD Wednesday due to holiday.  Glucose 800+ on presentation, still there.  Gap 31.  In DKA, started on Endotool.  K+ 6.7, no EKG changes.  Nephrology is going to take him to HD.   Still with n/v, ?aspiration, O2 sats dropped to 88% and CXR ok, doesn't sound congested.  Given Albuterol.     Review of Systems: As mentioned in the history of present illness. All other systems reviewed and are negative. Past Medical History:  Diagnosis Date   Benign hypertension with chronic kidney disease, stage III ([place]) 03/10/2019   Chronic kidney disease (CKD), stage III (moderate) ([place]) 03/10/2019   Diabetes mellitus type 1, uncomplicated ([place]) 10/11/5091   Diabetes mellitus without complication ([place])    Diabetic retinopathy ([place])    PDR OU   Heat Injury 03/10/2019   Hypercholesterolemia 03/10/2019   Hypertension    Retinal detachment    TRD OD   Past Surgical History:  Procedure Laterality Date   AV FISTULA PLACEMENT Right 11/08/2020   Procedure: RIGHT ARM BRACHIOCEPHALIC ARTERIOVENOUS (AV) FISTULA CREATION;  Surgeon: Angelia Mould, MD;  Location: [facility];  Service: Vascular;  Laterality: Right;   CATARACT EXTRACTION     EYE  SURGERY     RETINAL DETACHMENT SURGERY     Social History:  reports that he has never smoked. He has never used smokeless tobacco. He reports current alcohol use. He reports that he does not use drugs.  Allergies  Allergen Reactions   Fexofenadine Rash   Latex Rash   Chlorhexidine Gluconate Other (See Comments)    Blisters    Family History  Problem Relation Age of Onset   Diabetes Maternal Grandmother    Diabetes Sister     Prior to Admission medications   Medication Sig Start Date End Date Taking? Authorizing Provider  amLODipine (NORVASC) 10 MG tablet Take 10 mg by mouth daily. 08/12/22  Yes [provider]  aspirin EC 81 MG EC tablet Take 1 tablet (81 mg total) by mouth daily. Swallow whole. Patient taking differently: Take 81 mg by mouth every morning. Swallow whole. 07/24/20  Yes Geradine Girt, DO  calcitRIOL (ROCALTROL) 0.5 MCG capsule Take 1 capsule (0.5 mcg total) by mouth Every Tuesday,Thursday,and Saturday with dialysis. 07/27/22  Yes Danford, Suann Larry, MD  Cholecalciferol (VITAMIN D) 50 MCG (2000 UT) tablet Take 2,000 Units by mouth daily. 03/17/22  Yes [provider]  epoetin alfa-epbx (RETACRIT) 26712 UNIT/ML injection Inject 10,000 Units into the vein every 30 (thirty) days.   Yes [provider]  Glucagon (BAQSIMI ONE PACK) 3 MG/DOSE POWD Place 3 mg into the nose once as  needed (low blood sugar).   Yes [provider]  insulin degludec (TRESIBA) 100 UNIT/ML FlexTouch Pen Inject 15 Units into the skin at bedtime. 07/26/22  Yes Danford, Suann Larry, MD  insulin lispro (HUMALOG) 100 UNIT/ML KwikPen CORRECTION FACTOR: Sliding scale CBG 70 - 120: 0 units CBG 121 - 150: 0 unit,  CBG 151 - 200: 1 units,  CBG 201 - 250: 2 units,  CBG 251 - 300: 2 units,  CBG 301 - 350: 4 units,  CBG 351 - 400: 5 units   CBG > 400: 6 units and notify your MD 07/12/22  Yes Rai, Ripudeep K, MD  midodrine (PROAMATINE) 2.5 MG tablet Take 1 tablet (2.5 mg  total) by mouth 3 (three) times daily with meals. 07/30/22 08/29/22 Yes Alma Friendly, MD  ondansetron (ZOFRAN-ODT) 4 MG disintegrating tablet Take 1 tablet (4 mg total) by mouth every 8 (eight) hours as needed for nausea or vomiting. 07/12/22  Yes Rai, Ripudeep K, MD  pantoprazole (PROTONIX) 40 MG tablet Take 1 tablet (40 mg total) by mouth daily before breakfast. 07/12/22  Yes Rai, Ripudeep K, MD  sertraline (ZOLOFT) 50 MG tablet Take 1 tablet (50 mg total) by mouth daily. 07/12/22  Yes Rai, Ripudeep K, MD  sevelamer carbonate (RENVELA) 800 MG tablet Take 800 mg by mouth 3 (three) times daily with meals. 04/20/22  Yes [provider]  atorvastatin (LIPITOR) 80 MG tablet Take 1 tablet (80 mg total) by mouth daily. Patient not taking: Reported on 08/26/2022 07/24/20   Geradine Girt, DO  Blood Glucose Monitoring Suppl ([place]) w/Device KIT by Does not apply route. 07/31/18   [provider]  clopidogrel (PLAVIX) 75 MG tablet Take 75 mg by mouth every morning. Patient not taking: Reported on 08/26/2022 11/28/20   [provider]  Continuous Blood Gluc Receiver (FREESTYLE LIBRE 2 READER) DEVI 1 box Diagnosis: Diabetes mellitus type 1, IDDM 07/12/22   Rai, Vernelle Emerald, MD  Continuous Blood Gluc Sensor (FREESTYLE LIBRE 2 SENSOR) MISC Diagnosis: Diabetes mellitus type 1, IDDM 07/12/22   Rai, Vernelle Emerald, MD  HUMALOG KWIKPEN 100 UNIT/ML KwikPen Inject 5 Units into the skin 3 (three) times daily. Patient not taking: Reported on 08/26/2022 07/12/22   Rai, Vernelle Emerald, MD  Insulin Pen Needle 32G X 4 MM MISC Use with lantus and humalog 4 imes per day 07/12/22   Rai, Vernelle Emerald, MD  ONE TOUCH LANCETS MISC Use to check blood sugar 8 time(s) daily 07/12/22   Rai, Vernelle Emerald, MD  [facility] VERIO test strip SMARTSIG:Via Meter 8 Times Daily 07/12/22   Mendel Corning, MD    Physical Exam: Vitals:   08/26/22 1500 08/26/22 1504 08/26/22 1530 08/26/22 1546  BP: (!)  161/83 (!) 167/84  135/74  Pulse: 82 84 81 82  Resp: (!) _0 (!) 26  Temp: 98.2 F (36.8 C) 98.6 F (37 C)    TempSrc:      SpO2: 100% 100% 100% 100%  Weight: 77.2 kg 77.2 kg    Height:       General:  Appears calm and comfortable and is in NAD Eyes:  EOMI, normal lids, iris ENT:  grossly normal hearing, lips & tongue, mmm; edentulous Neck:  no LAD, masses or thyromegaly Cardiovascular:  RR with mild tachycardia, no m/r/g. No LE edema.  Respiratory:   CTA bilaterally with no wheezes/rales/rhonchi.  Normal respiratory effort. Abdomen:  soft, NT, ND Skin:  no rash or induration  seen on limited exam Musculoskeletal:  grossly normal tone BUE/BLE, good ROM, no bony abnormality Psychiatric:  grossly normal mood and affect, speech fluent and appropriate, AOx3 Neurologic:  CN 2-12 grossly intact, moves all extremities in coordinated fashion   Radiological Exams on Admission: Independently reviewed - see discussion in A/P where applicable  DG Chest 2 View  Result Date: 08/26/2022 CLINICAL DATA:  Shortness of breath. EXAM: CHEST - 2 VIEW COMPARISON:  July 10, 2022. FINDINGS: The heart size and mediastinal contours are within normal limits. Both lungs are clear. The visualized skeletal structures are unremarkable. IMPRESSION: No active cardiopulmonary disease. Electronically Signed   By: Marijo Conception M.D.   On: 08/26/2022 08:55    EKG: Independently reviewed.  NSR with rate 96; low voltage with no evidence of acute ischemia   Labs on Admission: I have personally reviewed the available labs and imaging studies at the time of the admission.  Pertinent labs:    Glucose >600, 878 VBG: 7.249/39.4/17.2 K+ 6.5 CO2 13 BUN 87/Creatinine 9.03/GFR 6 Anion gap 31 WBC 15.7 Hgb 11   Assessment and Plan: Principal Problem:   DKA (diabetic ketoacidosis) ([place]) Active Problems:   Hypercholesterolemia   Hypertension   Hyperkalemia   ESRD (end stage renal disease) ([place])     DKA -Patient with poor baseline control (recent A1c was 11.2) -He reports that his norm is to have glucose in the 800 range -No indication of illness as source -Moderate DKA on admission based on pH 7.249, HCO3 13, anion gap 31, patient alert -Will observe on progressive with DKA protocol -K+ increased at time of presentation, repeat BMP is pending and this will also likely improve with HD -IVF held at nephrology request -His primary issues appear to be need for education - diabetes coordinator and dietician consulted  ESRD on HD with hyperkalemia -Patient on chronic TTS HD -Nephrology prn order set utilized -He does appear to be volume overloaded with hyperkalemia and need of acute HD -Nephrology is aware that patient will need HD -Continue calcitriol, Renvela   HTN -Previously taken off HTN medications and started on midodrine after prolonged hospitalization with orthostatic hypotension -He was given compression stockings and an abdominal binder - but has not been wearing either -BP is elevated today but this is likely related to his urgent need for HD -Will cover with prn IV hydralazine for now -Continue midodrine  HLD -He has not been taking atorvastatin, likely needs to resume  CAD -Continue ASA -He has not been taking Plavix -Will defer to day team about whether to resume    Advance Care Planning:   Code Status: Full Code   Consults: Nephrology; DM coordinator, nutrition, TOC team  DVT Prophylaxis: Heparin  Family Communication: None present; he reported that his wife left just prior to my evaluation and did not request that I contact her  Severity of Illness: The appropriate patient status for this patient is OBSERVATION. Observation status is judged to be reasonable and necessary in order to provide the required intensity of service to ensure the patient's safety. The patient's presenting symptoms, physical exam findings, and initial radiographic and laboratory  data in the context of their medical condition is felt to place them at decreased risk for further clinical deterioration. Furthermore, it is anticipated that the patient will be medically stable for discharge from the hospital within 2 midnights of admission.   Author: Karmen Bongo, MD 08/26/2022 3:58 PM  For on call review [email or web address].

## 2022-08-26 NOTE — ED Notes (Signed)
Patient transported to CT 

## 2022-08-26 NOTE — ED Notes (Signed)
Notified Dr Lorin Mercy that pt's bp is trending upwards. Pt needs to go to dialysis very soon.

## 2022-08-26 NOTE — ED Notes (Signed)
Resp placed pt on 4L 02 per [place]

## 2022-08-26 NOTE — ED Triage Notes (Signed)
The pt is c/o vomiting since last pm no abd pain

## 2022-08-26 NOTE — ED Notes (Signed)
Pt started coughing about a minute or so after vomiting and stated he was having trouble breathing. Sa02 dropped down to 85%. I called for assistance and Dr Peter Congo came to bedside and assessed pt. Orders were placed and executed. Pt placed on 10 L 02 per [facility]. Respiratory was paged and came to bedside.

## 2022-08-26 NOTE — ED Provider Notes (Signed)
[facility] EMERGENCY DEPARTMENT Provider Note   CSN: [id number] Arrival date & time: 08/26/22  0607     History HTN, DM, ESRD5 Chief Complaint  Patient presents with   Emesis    [name] Louis Peters is a 56 y.o. male.   56 y.o male with a PMH of Type 1 diabetes mellitus with stage 5 chronic kidney disease and hypertension ([place] presents to the ED with a chief complaint of nausea and vomiting since last night.  Patient reports he ate "a lot of [place] "on Thursday, he feels somewhat like he overdid it.  He usually runs blood sugars in the 800s, though this is within his baseline.  He was last dialyzed Wednesday due to the holiday.  Patient endorses no abdominal pain, has not tried taking anything for improvement in his symptoms.  He is due for dialysis today around 11 AM this morning. He does have a recent hospitalization for DKA. No fever, no sick contacts or other complaints.   The history is provided by the patient.  Emesis Associated symptoms: no abdominal pain, no chills, no fever, no headaches and no sore throat        Home Medications Prior to Admission medications   Medication Sig Start Date End Date Taking? Authorizing Provider  amLODipine (NORVASC) 10 MG tablet Take 10 mg by mouth daily. 08/12/22  Yes [provider]  aspirin EC 81 MG EC tablet Take 1 tablet (81 mg total) by mouth daily. Swallow whole. Patient taking differently: Take 81 mg by mouth every morning. Swallow whole. 07/24/20  Yes Geradine Girt, DO  calcitRIOL (ROCALTROL) 0.5 MCG capsule Take 1 capsule (0.5 mcg total) by mouth Every Tuesday,Thursday,and Saturday with dialysis. 07/27/22  Yes Danford, Suann Larry, MD  Cholecalciferol (VITAMIN D) 50 MCG (2000 UT) tablet Take 2,000 Units by mouth daily. 03/17/22  Yes [provider]  epoetin alfa-epbx (RETACRIT) 85027 UNIT/ML injection Inject 10,000 Units into the vein every 30 (thirty) days.   Yes [provider]   Glucagon (BAQSIMI ONE PACK) 3 MG/DOSE POWD Place 3 mg into the nose once as needed (low blood sugar).   Yes [provider]  insulin degludec (TRESIBA) 100 UNIT/ML FlexTouch Pen Inject 15 Units into the skin at bedtime. 07/26/22  Yes Danford, Suann Larry, MD  insulin lispro (HUMALOG) 100 UNIT/ML KwikPen CORRECTION FACTOR: Sliding scale CBG 70 - 120: 0 units CBG 121 - 150: 0 unit,  CBG 151 - 200: 1 units,  CBG 201 - 250: 2 units,  CBG 251 - 300: 2 units,  CBG 301 - 350: 4 units,  CBG 351 - 400: 5 units   CBG > 400: 6 units and notify your MD 07/12/22  Yes Rai, Ripudeep K, MD  midodrine (PROAMATINE) 2.5 MG tablet Take 1 tablet (2.5 mg total) by mouth 3 (three) times daily with meals. 07/30/22 08/29/22 Yes Alma Friendly, MD  ondansetron (ZOFRAN-ODT) 4 MG disintegrating tablet Take 1 tablet (4 mg total) by mouth every 8 (eight) hours as needed for nausea or vomiting. 07/12/22  Yes Rai, Ripudeep K, MD  pantoprazole (PROTONIX) 40 MG tablet Take 1 tablet (40 mg total) by mouth daily before breakfast. 07/12/22  Yes Rai, Ripudeep K, MD  sertraline (ZOLOFT) 50 MG tablet Take 1 tablet (50 mg total) by mouth daily. 07/12/22  Yes Rai, Ripudeep K, MD  sevelamer carbonate (RENVELA) 800 MG tablet Take 800 mg by mouth 3 (three) times daily with meals. 04/20/22  Yes [provider]  atorvastatin (LIPITOR) 80 MG tablet Take 1 tablet (80 mg total) by mouth daily. Patient not taking: Reported on 08/26/2022 07/24/20   Geradine Girt, DO  Blood Glucose Monitoring Suppl ([place]) w/Device KIT by Does not apply route. 07/31/18   [provider]  clopidogrel (PLAVIX) 75 MG tablet Take 75 mg by mouth every morning. Patient not taking: Reported on 08/26/2022 11/28/20   [provider]  Continuous Blood Gluc Receiver (FREESTYLE LIBRE 2 READER) DEVI 1 box Diagnosis: Diabetes mellitus type 1, IDDM 07/12/22   Rai, Vernelle Emerald, MD  Continuous Blood Gluc Sensor (FREESTYLE  LIBRE 2 SENSOR) MISC Diagnosis: Diabetes mellitus type 1, IDDM 07/12/22   Rai, Vernelle Emerald, MD  HUMALOG KWIKPEN 100 UNIT/ML KwikPen Inject 5 Units into the skin 3 (three) times daily. Patient not taking: Reported on 08/26/2022 07/12/22   Mendel Corning, MD  Insulin Pen Needle 32G X 4 MM MISC Use with lantus and humalog 4 imes per day 07/12/22   Rai, Vernelle Emerald, MD  ONE TOUCH LANCETS MISC Use to check blood sugar 8 time(s) daily 07/12/22   Rai, Vernelle Emerald, MD  [facility] VERIO test strip SMARTSIG:Via Meter 8 Times Daily 07/12/22   Rai, Vernelle Emerald, MD      Allergies    Fexofenadine, Latex, and Chlorhexidine gluconate    Review of Systems   Review of Systems  Constitutional:  Negative for chills and fever.  HENT:  Negative for sore throat.   Respiratory:  Negative for shortness of breath.   Cardiovascular:  Negative for chest pain.  Gastrointestinal:  Positive for nausea and vomiting. Negative for abdominal pain.  Genitourinary:  Negative for flank pain.  Musculoskeletal:  Negative for back pain.  Skin:  Negative for pallor and wound.  Neurological:  Negative for light-headedness and headaches.  All other systems reviewed and are negative.   Physical Exam Updated Vital Signs BP (!) 146/68   Pulse 95   Temp 97.8 F (36.6 C) (Oral)   Resp 17   Ht _0  (1.778 m)   Wt 75.4 kg   SpO2 100%   BMI 23.85 kg/m  Physical Exam Vitals and nursing note reviewed.  Constitutional:      Appearance: Normal appearance.  HENT:     Head: Normocephalic and atraumatic.     Nose: Nose normal.     Mouth/Throat:     Mouth: Mucous membranes are dry.  Cardiovascular:     Rate and Rhythm: Normal rate.  Pulmonary:     Effort: Pulmonary effort is normal.     Breath sounds: No wheezing.     Comments: Lungs are clear to auscultation. Abdominal:     General: Abdomen is flat.     Palpations: Abdomen is soft.     Tenderness: There is no abdominal tenderness.     Comments: Abdomen is soft non tender  to palpation.   Musculoskeletal:     Cervical back: Normal range of motion and neck supple.  Skin:    General: Skin is warm and dry.       Neurological:     Mental Status: He is alert and oriented to person, place, and time.     ED Results / Procedures / Treatments   Labs (all labs ordered are listed, but only abnormal results are displayed) Labs Reviewed  COMPREHENSIVE METABOLIC PANEL - Abnormal; Notable for the following components:      Result Value   Sodium 131 (*)  Potassium 6.5 (*)    Chloride 87 (*)    CO2 13 (*)    Glucose, Bld 878 (*)    BUN 87 (*)    Creatinine, Ser 9.03 (*)    GFR, Estimated 6 (*)    Anion gap 31 (*)    All other components within normal limits  CBC - Abnormal; Notable for the following components:   WBC 15.7 (*)    RBC 3.61 (*)    Hemoglobin 11.0 (*)    HCT 37.0 (*)    MCV 102.5 (*)    MCHC 29.7 (*)    RDW 15.7 (*)    All other components within normal limits  BASIC METABOLIC PANEL - Abnormal; Notable for the following components:   Sodium 131 (*)    Potassium 6.2 (*)    Chloride 84 (*)    CO2 16 (*)    Glucose, Bld 883 (*)    BUN 90 (*)    Creatinine, Ser 9.53 (*)    GFR, Estimated 6 (*)    Anion gap 31 (*)    All other components within normal limits  CBG MONITORING, ED - Abnormal; Notable for the following components:   Glucose-Capillary >600 (*)    All other components within normal limits  I-STAT VENOUS BLOOD GAS, ED - Abnormal; Notable for the following components:   pH, Ven 7.249 (*)    pCO2, Ven 39.4 (*)    Bicarbonate 17.2 (*)    TCO2 18 (*)    Acid-base deficit 9.0 (*)    Sodium 127 (*)    Potassium 6.0 (*)    Calcium, Ion 1.09 (*)    HCT 35.0 (*)    Hemoglobin 11.9 (*)    All other components within normal limits  CBG MONITORING, ED - Abnormal; Notable for the following components:   Glucose-Capillary >600 (*)    All other components within normal limits  CBG MONITORING, ED - Abnormal; Notable for the  following components:   Glucose-Capillary >600 (*)    All other components within normal limits  CBG MONITORING, ED - Abnormal; Notable for the following components:   Glucose-Capillary >600 (*)    All other components within normal limits  CBG MONITORING, ED - Abnormal; Notable for the following components:   Glucose-Capillary >600 (*)    All other components within normal limits  URINALYSIS, ROUTINE W REFLEX MICROSCOPIC  BASIC METABOLIC PANEL  BASIC METABOLIC PANEL  BASIC METABOLIC PANEL  BETA-HYDROXYBUTYRIC ACID  BETA-HYDROXYBUTYRIC ACID  HEPATITIS B SURFACE ANTIGEN  HEPATITIS B SURFACE ANTIBODY, QUANTITATIVE  RENAL FUNCTION PANEL    EKG EKG Interpretation  Date/Time:  Saturday August 26 2022 08:19:11 EST Ventricular Rate:  96 PR Interval:  236 QRS Duration: 89 QT Interval:  359 QTC Calculation: 454 R Axis:   75 Text Interpretation: Sinus rhythm Prolonged PR interval Probable left atrial enlargement Low voltage, precordial leads Minimal ST depression, lateral leads no sig change from previous Confirmed by Charlesetta Shanks [phone]) on 08/26/2022 9:42:32 AM  Radiology DG Chest 2 View  Result Date: 08/26/2022 CLINICAL DATA:  Shortness of breath. EXAM: CHEST - 2 VIEW COMPARISON:  July 10, 2022. FINDINGS: The heart size and mediastinal contours are within normal limits. Both lungs are clear. The visualized skeletal structures are unremarkable. IMPRESSION: No active cardiopulmonary disease. Electronically Signed   By: Marijo Conception M.D.   On: 08/26/2022 08:55    Procedures Procedures    Medications Ordered in ED Medications  insulin regular, human (  MYXREDLIN) 100 units/ 100 mL infusion (7.5 Units/hr Intravenous Rate/Dose Change 08/26/22 1053)  lactated ringers infusion (has no administration in time range)  dextrose 5 % in lactated ringers infusion (has no administration in time range)  dextrose 50 % solution 0-50 mL (has no administration in time range)  ondansetron  (ZOFRAN-ODT) disintegrating tablet 4 mg (4 mg Oral Given 08/26/22 0624)  lactated ringers bolus 1,508 mL (1,508 mLs Intravenous New Bag/Given 08/26/22 0850)  albuterol (PROVENTIL) (2.5 MG/3ML) 0.083% nebulizer solution 2.5 mg (2.5 mg Nebulization Given 08/26/22 0914)    ED Course/ Medical Decision Making/ A&P Clinical Course as of 08/26/22 1127  Sat Aug 26, 2022  1033 Potassium(!!): 6.5 [JS]  1033 WBC(!): 15.7 [JS]    Clinical Course User Index [JS] Janeece Fitting, PA-C                           Medical Decision Making Amount and/or Complexity of Data Reviewed Labs: ordered. Decision-making details documented in ED Course. Radiology: ordered.  Risk Prescription drug management. Decision regarding hospitalization.    This patient presents to the ED for concern of nausea and vomiting, this involves a number of treatment options, and is a complaint that carries with it a high risk of complications and morbidity.  The differential diagnosis includes DKA, Viral enteritis versus bowel obstruction.    Co morbidities: Discussed in HPI   Brief History:  Patient with underlying ESRD, diabetes presents to the ED with a chief complaint of nausea and vomiting which began last night.  Underlying diabetes and not well-controlled with a recent admission for DKA.  Has not taking any medication for improvement in symptoms.  Does receive dialysis Tuesday, Thursday, Saturdays however was dialyzed early on Wednesday due to the holiday schedule.  EMR reviewed including pt PMHx, past surgical history and past visits to ER.   See HPI for more details   Lab Tests:  I ordered and independently interpreted labs.  The pertinent results include:    Labs are critical for a CMP with a potassium of 6.5, creatinine level is elevated at 9.03, patient is due for dialysis today.  Slight decrease in his sodium.  LFTs are within normal limits.  CBC with a leukocytosis of 15.7, he continues to deny any  abdominal pain, chest pain, shortness of breath.   Imaging Studies:  Chest x-ray obtained without any signs of pulmonary edema. No acute findings.  Cardiac Monitoring:  The patient was maintained on a cardiac monitor.  I personally viewed and interpreted the cardiac monitored which showed an underlying rhythm of:NSR, no signs of peaked T waves.  EKG non-ischemic  Medicines ordered:  I ordered medication including insulin  for blood sugar contol Reevaluation of the patient after these medicines showed that the patient stayed the same I have reviewed the patients home medicines and have made adjustments as needed  Critical Interventions:   patient with a blood sugar of 878, hyperkalemia along with an anion gap of 31 suspicion of DKA, monitor prior visit, started on glucose stabilizer and provided with insulin.   Consults:  I requested consultation with nephology Dr. Joylene Grapes , and discussed lab and imaging findings as well as pertinent plan - they recommend:Urgent dialysis sometime today.   Reevaluation:  After the interventions noted above I re-evaluated patient and found that they have :stayed the same  Social Determinants of Health:  The patient's social determinants of health were a factor in the care of  this patient  Problem List / ED Course:  Patient with underlying diabetes, ESRD on dialysis Tuesday, Thursday, Saturday last dialyzed Wednesday due to the holiday here with elevated blood sugar with a level at 800, potassium noted at 6.5, reports nausea and vomiting which began last night.  He states "like I over 8 on Thanksgiving ".  He does have a prior admission for DKA recently, he does report his blood sugars usually run around 800 at baseline.  He reports his highest blood sugar to be around 1200.  He is mentating well during our initial evaluation without any active vomiting.  During my evaluation his lungs are slightly diminished to auscultation, but obvious wheezing or  rales.Initial EKG obtained without any hyper T waves, will correct blood sugar with glucose stabilizer. While labs were being obtained patient began to have another episode of vomiting, began to cough, and O2 saturations drop around the 80%, he was placed on a nonrebreather, nephrology at the bedside along with my attending Dr. Vallery Ridge.  Patient had a portable chest x-ray repeated after his baseline x-ray was normal.  No signs of pulmonary edema.  Patient's work of breathing improved after albuterol nebulizer treatment, he is mentation well. He will require admission in order to treat his DKA. Anion gap of 31, VBG is currently pending. He will also go for dialysis today. Call placed for hospitalist.  Spoke to Dr. Lorin Mercy who will admit patient for further management. Appreciate her assistance.   Dispostion:  After consideration of the diagnostic results and the patients response to treatment, I feel that the patent would benefit from admission along with dialysis today.    Portions of this note were generated with [employer]. Dictation errors may occur despite best attempts at proofreading.   Final Clinical Impression(s) / ED Diagnoses Final diagnoses:  Diabetic ketoacidosis without coma associated with type 1 diabetes mellitus ([place])  Hyperkalemia    Rx / DC Orders ED Discharge Orders     None         Janeece Fitting, Hershal Coria 08/26/22 1127    Charlesetta Shanks, MD 08/28/22 1758

## 2022-08-26 NOTE — Consult Note (Signed)
[facility] Renal Consultation Note    Indication for Consultation:  Management of ESRD/hemodialysis, anemia, hypertension/volume, and secondary hyperparathyroidism.  HPI: Louis Peters is a 56 y.o. male with PMH including ESRD on dialysis, insulin dependent diabetes mellitus, and HTN who presented to the ED this morning with nausea and vomiting. Patient reports he feels he "over did it" with Thanksgiving dinner and nausea started shortly after. No known sick contacts. Reports he did not miss any dialysis but last treatment was Wednesday due to holiday schedule, due for dialysis today. Initial K+ 6.5 and glucose 878. WBC 15.7, Hgb 11, Plt 364. He was started on IV insulin, 1L lactated ringers, albuterol and zofran. Repeat BMP already ordered to recheck K+. Recent admission for DKA (discharged 07/30/22). Nephrology consulted for management of ESRD.   Patient reports ongoing nausea. Reports he has been vomiting for the past 24 hours and has not taken his medications including insulin. Denies any SOB, cough, CP, dizziness, abdominal pain, edema, fever and chills.   Past Medical History:  Diagnosis Date   Benign hypertension with chronic kidney disease, stage III ([place]) 03/10/2019   Chronic kidney disease (CKD), stage III (moderate) ([place]) 03/10/2019   Diabetes mellitus type 1, uncomplicated ([place]) 12/05/6292   Diabetes mellitus without complication ([place])    Diabetic retinopathy ([place])    PDR OU   Heat Injury 03/10/2019   Hypercholesterolemia 03/10/2019   Hypertension    Retinal detachment    TRD OD   Past Surgical History:  Procedure Laterality Date   AV FISTULA PLACEMENT Right 11/08/2020   Procedure: RIGHT ARM BRACHIOCEPHALIC ARTERIOVENOUS (AV) FISTULA CREATION;  Surgeon: Angelia Mould, MD;  Location: [facility] OR;  Service: Vascular;  Laterality: Right;   CATARACT EXTRACTION     EYE SURGERY     RETINAL DETACHMENT SURGERY     Family History  Problem Relation Age of Onset   Diabetes  Maternal Grandmother    Diabetes Sister    Social History:  reports that he has never smoked. He has never used smokeless tobacco. He reports current alcohol use. He reports that he does not use drugs.  ROS: As per HPI otherwise negative. Physical Exam: Vitals:   08/26/22 0845 08/26/22 0855 08/26/22 0900 08/26/22 0905  BP: (!) 111/40   (!) 118/47  Pulse: 85 95 96 97  Resp: 20 (!) 28 (!) 31 (!) 22  Temp:      TempSrc:      SpO2: 100% 100% 100% 100%  Weight:      Height:         General: Well developed, alert male Head: Normocephalic, atraumatic, sclera non-icteric, mucus membranes are moist. Lungs: Clear bilaterally to auscultation without wheezes, rales, or rhonchi. Breathing is unlabored on RA Heart: RRR with normal S1, S2. No murmurs, rubs, or gallops appreciated. Abdomen: Soft, non-distended with normoactive bowel sounds. No obvious abdominal masses. Lower extremities: No edema b/l lower extremities Neuro: Alert and oriented X 3. Moves all extremities spontaneously. Psych:  Responds to questions appropriately with a normal affect. Dialysis Access: RUE AVF + bruit  Allergies  Allergen Reactions   Fexofenadine Rash   Latex Rash   Chlorhexidine Gluconate Other (See Comments)    Blisters   Prior to Admission medications   Medication Sig Start Date End Date Taking? Authorizing Provider  amLODipine (NORVASC) 10 MG tablet Take 10 mg by mouth daily. 08/12/22  Yes [provider]  aspirin EC 81 MG EC tablet Take 1 tablet (81 mg total)  by mouth daily. Swallow whole. Patient taking differently: Take 81 mg by mouth every morning. Swallow whole. 07/24/20  Yes Geradine Girt, DO  calcitRIOL (ROCALTROL) 0.5 MCG capsule Take 1 capsule (0.5 mcg total) by mouth Every Tuesday,Thursday,and Saturday with dialysis. 07/27/22  Yes Danford, Suann Larry, MD  Cholecalciferol (VITAMIN D) 50 MCG (2000 UT) tablet Take 2,000 Units by mouth daily. 03/17/22  Yes [provider]   epoetin alfa-epbx (RETACRIT) 29798 UNIT/ML injection Inject 10,000 Units into the vein every 30 (thirty) days.   Yes [provider]  Glucagon (BAQSIMI ONE PACK) 3 MG/DOSE POWD Place 3 mg into the nose once as needed (low blood sugar).   Yes [provider]  insulin lispro (HUMALOG) 100 UNIT/ML KwikPen CORRECTION FACTOR: Sliding scale CBG 70 - 120: 0 units CBG 121 - 150: 0 unit,  CBG 151 - 200: 1 units,  CBG 201 - 250: 2 units,  CBG 251 - 300: 2 units,  CBG 301 - 350: 4 units,  CBG 351 - 400: 5 units   CBG > 400: 6 units and notify your MD 07/12/22  Yes Rai, Ripudeep K, MD  midodrine (PROAMATINE) 2.5 MG tablet Take 1 tablet (2.5 mg total) by mouth 3 (three) times daily with meals. 07/30/22 08/29/22 Yes Alma Friendly, MD  ondansetron (ZOFRAN-ODT) 4 MG disintegrating tablet Take 1 tablet (4 mg total) by mouth every 8 (eight) hours as needed for nausea or vomiting. 07/12/22  Yes Rai, Ripudeep K, MD  pantoprazole (PROTONIX) 40 MG tablet Take 1 tablet (40 mg total) by mouth daily before breakfast. 07/12/22  Yes Rai, Ripudeep K, MD  sertraline (ZOLOFT) 50 MG tablet Take 1 tablet (50 mg total) by mouth daily. 07/12/22  Yes Rai, Ripudeep K, MD  sevelamer carbonate (RENVELA) 800 MG tablet Take 800 mg by mouth 3 (three) times daily. 04/20/22  Yes [provider]  atorvastatin (LIPITOR) 80 MG tablet Take 1 tablet (80 mg total) by mouth daily. Patient not taking: Reported on 08/26/2022 07/24/20   Geradine Girt, DO  Blood Glucose Monitoring Suppl ([place]) w/Device KIT by Does not apply route. 07/31/18   [provider]  clopidogrel (PLAVIX) 75 MG tablet Take 75 mg by mouth every morning. Patient not taking: Reported on 08/26/2022 11/28/20   [provider]  Continuous Blood Gluc Receiver (FREESTYLE LIBRE 2 READER) DEVI 1 box Diagnosis: Diabetes mellitus type 1, IDDM 07/12/22   Rai, Vernelle Emerald, MD  Continuous Blood Gluc Sensor (FREESTYLE LIBRE  2 SENSOR) MISC Diagnosis: Diabetes mellitus type 1, IDDM 07/12/22   Rai, Vernelle Emerald, MD  HUMALOG KWIKPEN 100 UNIT/ML KwikPen Inject 5 Units into the skin 3 (three) times daily. 07/12/22   Rai, Ripudeep K, MD  insulin degludec (TRESIBA) 100 UNIT/ML FlexTouch Pen Inject 15 Units into the skin at bedtime. 07/26/22   Danford, Suann Larry, MD  Insulin Pen Needle 32G X 4 MM MISC Use with lantus and humalog 4 imes per day 07/12/22   Rai, Vernelle Emerald, MD  metoCLOPramide (REGLAN) 5 MG tablet Take 1 tablet (5 mg total) by mouth 4 (four) times daily -  before meals and at bedtime for 14 days. 07/30/22 08/13/22  Alma Friendly, MD  ONE TOUCH LANCETS MISC Use to check blood sugar 8 time(s) daily 07/12/22   Rai, Vernelle Emerald, MD  [facility] VERIO test strip SMARTSIG:Via Meter 8 Times Daily 07/12/22   Rai, Vernelle Emerald, MD   Current Facility-Administered Medications  Medication Dose Route  Frequency Provider Last Rate Last Admin   dextrose 5 % in lactated ringers infusion   Intravenous Continuous Soto, Johana, PA-C       dextrose 50 % solution 0-50 mL  0-50 mL Intravenous PRN Soto, Johana, PA-C       insulin regular, human (MYXREDLIN) 100 units/ 100 mL infusion   Intravenous Continuous Soto, Johana, PA-C 6.5 mL/hr at 08/26/22 0850 6.5 Units/hr at 08/26/22 0850   lactated ringers infusion   Intravenous Continuous Janeece Fitting, PA-C       Current Outpatient Medications  Medication Sig Dispense Refill   amLODipine (NORVASC) 10 MG tablet Take 10 mg by mouth daily.     aspirin EC 81 MG EC tablet Take 1 tablet (81 mg total) by mouth daily. Swallow whole. (Patient taking differently: Take 81 mg by mouth every morning. Swallow whole.) 30 tablet 11   calcitRIOL (ROCALTROL) 0.5 MCG capsule Take 1 capsule (0.5 mcg total) by mouth Every Tuesday,Thursday,and Saturday with dialysis. 10 capsule 0   Cholecalciferol (VITAMIN D) 50 MCG (2000 UT) tablet Take 2,000 Units by mouth daily.     epoetin alfa-epbx (RETACRIT) 56979  UNIT/ML injection Inject 10,000 Units into the vein every 30 (thirty) days.     Glucagon (BAQSIMI ONE PACK) 3 MG/DOSE POWD Place 3 mg into the nose once as needed (low blood sugar).     insulin lispro (HUMALOG) 100 UNIT/ML KwikPen CORRECTION FACTOR: Sliding scale CBG 70 - 120: 0 units CBG 121 - 150: 0 unit,  CBG 151 - 200: 1 units,  CBG 201 - 250: 2 units,  CBG 251 - 300: 2 units,  CBG 301 - 350: 4 units,  CBG 351 - 400: 5 units   CBG > 400: 6 units and notify your MD 15 mL 11   midodrine (PROAMATINE) 2.5 MG tablet Take 1 tablet (2.5 mg total) by mouth 3 (three) times daily with meals. 90 tablet 0   ondansetron (ZOFRAN-ODT) 4 MG disintegrating tablet Take 1 tablet (4 mg total) by mouth every 8 (eight) hours as needed for nausea or vomiting. 30 tablet 0   pantoprazole (PROTONIX) 40 MG tablet Take 1 tablet (40 mg total) by mouth daily before breakfast. 30 tablet 3   sertraline (ZOLOFT) 50 MG tablet Take 1 tablet (50 mg total) by mouth daily. 30 tablet 1   sevelamer carbonate (RENVELA) 800 MG tablet Take 800 mg by mouth 3 (three) times daily.     atorvastatin (LIPITOR) 80 MG tablet Take 1 tablet (80 mg total) by mouth daily. (Patient not taking: Reported on 08/26/2022) 30 tablet 0   Blood Glucose Monitoring Suppl ([place]) w/Device KIT by Does not apply route.     clopidogrel (PLAVIX) 75 MG tablet Take 75 mg by mouth every morning. (Patient not taking: Reported on 08/26/2022)     Continuous Blood Gluc Receiver (FREESTYLE LIBRE 2 READER) DEVI 1 box Diagnosis: Diabetes mellitus type 1, IDDM 1 each 3   Continuous Blood Gluc Sensor (FREESTYLE LIBRE 2 SENSOR) MISC Diagnosis: Diabetes mellitus type 1, IDDM 1 each 3   HUMALOG KWIKPEN 100 UNIT/ML KwikPen Inject 5 Units into the skin 3 (three) times daily. 15 mL 11   insulin degludec (TRESIBA) 100 UNIT/ML FlexTouch Pen Inject 15 Units into the skin at bedtime. 10 mL 0   Insulin Pen Needle 32G X 4 MM MISC Use with lantus and humalog 4 imes per  day 100 each 3   metoCLOPramide (REGLAN) 5 MG tablet Take 1 tablet (  5 mg total) by mouth 4 (four) times daily -  before meals and at bedtime for 14 days. 56 tablet 0   ONE TOUCH LANCETS MISC Use to check blood sugar 8 time(s) daily 100 each 3   ONETOUCH VERIO test strip SMARTSIG:Via Meter 8 Times Daily 100 each 12   Labs: Basic Metabolic Panel: Recent Labs  Lab 08/26/22 0622  NA 131*  K 6.5*  CL 87*  CO2 13*  GLUCOSE 878*  BUN 87*  CREATININE 9.03*  CALCIUM 9.7   Liver Function Tests: Recent Labs  Lab 08/26/22 0622  AST 32  ALT 31  ALKPHOS 92  BILITOT 1.2  PROT 7.1  ALBUMIN 4.0   No results for input(s): "LIPASE", "AMYLASE" in the last 168 hours. No results for input(s): "AMMONIA" in the last 168 hours. CBC: Recent Labs  Lab 08/26/22 0622  WBC 15.7*  HGB 11.0*  HCT 37.0*  MCV 102.5*  PLT 364   Cardiac Enzymes: No results for input(s): "CKTOTAL", "CKMB", "CKMBINDEX", "TROPONINI" in the last 168 hours. CBG: Recent Labs  Lab 08/26/22 0845 08/26/22 0905  GLUCAP >600* >600*   Iron Studies: No results for input(s): "IRON", "TIBC", "TRANSFERRIN", "FERRITIN" in the last 72 hours. Studies/Results: DG Chest 2 View  Result Date: 08/26/2022 CLINICAL DATA:  Shortness of breath. EXAM: CHEST - 2 VIEW COMPARISON:  July 10, 2022. FINDINGS: The heart size and mediastinal contours are within normal limits. Both lungs are clear. The visualized skeletal structures are unremarkable. IMPRESSION: No active cardiopulmonary disease. Electronically Signed   By: Marijo Conception M.D.   On: 08/26/2022 08:55    Dialysis Orders:  Center: [facility]  on TTS . 180NRe 4 hours BFR 400 DFR Auto 1.5 EDW 74.5kg (last weight 76.2kg) 2K 2Ca AVF 15g no heparin Mircera 130mg IV q 4 weeks- last dose 08/03/22 Calcitriol 0.576m PO q HD  Assessment/Plan:  Nausea/vomiting: Likely related to severe hyperglycemia, management per primary team Hyperkalemia: in setting of hyperglycemia, given temporizing  measures and repeat K+ ordered. Does have prolonged PR interval and some peaking of T waves on EKG. Will likely normalize with improvement in hyperglycemia and with dialysis.   ESRD:  TTS schedule, had not missed HD but off schedule this week due to holiday. Will arrange for HD today.   Hypertension/volume: BP controlled, no volume overload on exam. Has been given 1L IVF for hyperglycemia. Above his outpatient EDW but had not met it recently.  Anemia: Hgb at goal, no ESA indicated at this time.   Metabolic bone disease: Calcium at goal, will check phos. Continue VDRA/binders once tolerating PO  Nutrition:  Currently NPO. Alb 4.   SaAnice PaganiniPA-C 08/26/2022, 9:27 AM  [facility] Pager: [phone]

## 2022-08-27 DIAGNOSIS — E1010 Type 1 diabetes mellitus with ketoacidosis without coma: Secondary | ICD-10-CM | POA: Diagnosis not present

## 2022-08-27 LAB — CBG MONITORING, ED
Glucose-Capillary: 131 mg/dL — ABNORMAL HIGH (ref 70–99)
Glucose-Capillary: 154 mg/dL — ABNORMAL HIGH (ref 70–99)
Glucose-Capillary: 206 mg/dL — ABNORMAL HIGH (ref 70–99)
Glucose-Capillary: 226 mg/dL — ABNORMAL HIGH (ref 70–99)

## 2022-08-27 LAB — BASIC METABOLIC PANEL
Anion gap: 13 (ref 5–15)
BUN: 40 mg/dL — ABNORMAL HIGH (ref 6–20)
CO2: 28 mmol/L (ref 22–32)
Calcium: 9 mg/dL (ref 8.9–10.3)
Chloride: 95 mmol/L — ABNORMAL LOW (ref 98–111)
Creatinine, Ser: 5.2 mg/dL — ABNORMAL HIGH (ref 0.61–1.24)
GFR, Estimated: 12 mL/min — ABNORMAL LOW (ref >60)
Glucose, Bld: 236 mg/dL — ABNORMAL HIGH (ref 70–99)
Potassium: 4 mmol/L (ref 3.5–5.1)
Sodium: 136 mmol/L (ref 135–145)

## 2022-08-27 LAB — BETA-HYDROXYBUTYRIC ACID: Beta-Hydroxybutyric Acid: 0.66 mmol/L — ABNORMAL HIGH (ref 0.05–0.27)

## 2022-08-27 MED ORDER — INSULIN DEGLUDEC 100 UNIT/ML ~~LOC~~ SOPN: 10.0000 [IU] | PEN_INJECTOR | Freq: Every day | SUBCUTANEOUS | 0 refills | Status: AC

## 2022-08-27 NOTE — Progress Notes (Signed)
[facility] Progress Note   Subjective:   Tolerated HD well overnight. BP increased throughout the day yesterday. Pt reports high BP outpatient recently and was recently started on an antihypertensive outpatient - appears it is amlodipine. He denies SOB, CP, dizziness, abdominal pain, HA and nausea.   Objective Vitals:   08/26/22 2131 08/27/22 0000 08/27/22 0200 08/27/22 0643  BP: (!) 149/68 (!) 112/52 134/66 (!) 166/90  Pulse: 87 89 87 81  Resp: 15 12 20 14   Temp: 99.2 F (37.3 C)   99.1 F (37.3 C)  TempSrc: Oral   Oral  SpO2: 100% 99% 100% 100%  Weight:      Height:       Physical Exam General: Alert male in NAD, eating breakfast Heart: RRR, no murmurs, rubs or gallops Lungs: CTA bilaterally without wheezing, rhonchi or rales Abdomen: Soft, non-tender, non-distended, +BS Extremities: No edema b/l lower extremities Dialysis Access:  RUE AVF + bruit  Additional Objective Labs: Basic Metabolic Panel: Recent Labs  Lab 08/26/22 1811 08/26/22 2307 08/27/22 0236  NA 139 138 136  K 3.9 3.7 4.0  CL 94* 97* 95*  CO2 28 25 28   GLUCOSE 156* 140* 236*  BUN 49* 35* 40*  CREATININE 5.25* 4.57* 5.20*  CALCIUM 9.4 8.7* 9.0  PHOS 3.6  --   --    Liver Function Tests: Recent Labs  Lab 08/26/22 0622 08/26/22 1811  AST 32  --   ALT 31  --   ALKPHOS 92  --   BILITOT 1.2  --   PROT 7.1  --   ALBUMIN 4.0 3.6   No results for input(s): "LIPASE", "AMYLASE" in the last 168 hours. CBC: Recent Labs  Lab 08/26/22 0622 08/26/22 1018  WBC 15.7*  --   HGB 11.0* 11.9*  HCT 37.0* 35.0*  MCV 102.5*  --   PLT 364  --    Blood Culture    Component Value Date/Time   SDES  01/02/2021 1210    BLOOD LEFT HAND Performed at [facility], [address], [place], [place] [address]    [facility]  01/02/2021 1210    BOTTLES DRAWN AEROBIC AND ANAEROBIC Blood Culture adequate volume Performed at [facility], [address], [place], [place]  [address]    CULT  01/02/2021 1210    NO GROWTH 5 DAYS Performed at [facility], [place] [address] [place], [place] [address]    REPTSTATUS 01/07/2021 FINAL 01/02/2021 1210    Cardiac Enzymes: No results for input(s): "CKTOTAL", "CKMB", "CKMBINDEX", "TROPONINI" in the last 168 hours. CBG: Recent Labs  Lab 08/26/22 2226 08/26/22 2252 08/27/22 0009 08/27/22 0104 08/27/22 0756  GLUCAP 138* 131* 226* 206* 154*   Iron Studies: No results for input(s): "IRON", "TIBC", "TRANSFERRIN", "FERRITIN" in the last 72 hours. @lablastinr3 @ Studies/Results: DG Chest 2 View  Result Date: 08/26/2022 CLINICAL DATA:  Shortness of breath. EXAM: CHEST - 2 VIEW COMPARISON:  July 10, 2022. FINDINGS: The heart size and mediastinal contours are within normal limits. Both lungs are clear. The visualized skeletal structures are unremarkable. IMPRESSION: No active cardiopulmonary disease. Electronically Signed   By: Marijo Conception M.D.   On: 08/26/2022 08:55   Medications:   aspirin EC  81 mg Oral q morning   calcitRIOL  0.5 mcg Oral Q T,Th,Sa-HD   heparin  5,000 Units Subcutaneous Q8H   insulin aspart  0-5 Units Subcutaneous QHS   insulin aspart  0-6 Units  Subcutaneous TID WC   insulin aspart  3 Units Subcutaneous TID WC   insulin glargine-yfgn  10 Units Subcutaneous QHS   midodrine  2.5 mg Oral TID WC   pantoprazole  40 mg Oral QAC breakfast   sertraline  50 mg Oral Daily   sevelamer carbonate  800 mg Oral TID WC   sodium chloride flush  3 mL Intravenous Q12H    Dialysis Orders: Center: [facility]  on TTS . 180NRe 4 hours BFR 400 DFR Auto 1.5 EDW 74.5kg (last weight 76.2kg) 2K 2Ca AVF 15g no heparin Mircera 111mcg IV q 4 weeks- last dose 08/03/22 Calcitriol 0.4mcg PO q HD  Assessment/Plan: Nausea/vomiting: Likely related to severe hyperglycemia. Improved today Uncontrolled DM: BS 800's on arrival, improving on insulin. Per admitting team Hyperkalemia: in setting of hyperglycemia, resolved post  HD  ESRD:  TTS schedule, had not missed HD but off schedule this week due to holiday. Had HD yesterday, next HD likely Tuesday  Hypertension/volume: Labile BP, recently on midodrine for hypotension but BP high recently and appears he was started on amlodipine recently. Weight also still up a bit with IVF yesterday. Continue UF with HD as tolerated.   Anemia: Hgb at goal, no ESA indicated at this time.   Metabolic bone disease: Calcium and phos at goal. Continue VDRA/binders   Nutrition: Alb 3.5-4 range. Continue renal diet.   Anice Paganini, PA-C 08/27/2022, 9:12 AM  [facility] Pager: [phone]

## 2022-08-27 NOTE — Discharge Summary (Signed)
Physician Discharge Summary  Louis Peters NWG:[id number] DOB: 04-25-1966 DOA: 08/26/2022  PCP: Louis Limbo, MD  Admit date: 08/26/2022 Discharge date: 08/27/2022  Admitted From: Home Disposition: Home  Recommendations for Outpatient Follow-up:  Follow up with PCP in 1-2 weeks Increased Tresiba to 10 units [place] daily  Home Health: No Equipment/Devices: None  Discharge Condition: Stable CODE STATUS: Full code Diet recommendation: Heart healthy/consistent carb regular diet  History of present illness:  Louis Peters is a 56 year old male with past medical history significant for ESRD on HD TTS, type 1 diabetes mellitus, essential hypertension, hyperlipidemia who presented to [facility] ED on 11/25 with nausea and vomiting.  Patient reports due to not feeling well he did not take his insulin over the past 2 days.  Recently admitted 10/9 - 10/29 for DKA and orthostatic hypotension and discharged on compression stockings, abdominal binder and midodrine.  Patient was prescribed 15 units of Tresiba but reported hypoglycemic events and has only been taking 8 units at home.  In the ED, temperature 98.2 F, HR 96, RR 16, BP 141/58, SpO2 100% on room air.  WBC 15.7, hemoglobin 11.0, platelets 364.  VBG with pH 7.249.  Sodium 131, potassium 6.5, chloride 87, CO2 13, glucose 878, BUN 87, creatinine 9.03.  Anion gap 31.  AST 32, ALT 31, total bilirubin 1.2.  Beta hydroxybutyrate acid 5.53. Chest x-ray with no active cardiopulmonary disease process.  Nephrology was consulted.  Patient was started on insulin drip.  Hospitalist service consulted for admission for further evaluation and treatment of DKA.   Hospital course:  Diabetic ketoacidosis Type 1 diabetes mellitus Patient presenting to ED with nausea and vomiting.  Has been noncompliant with his insulin regimen over the last 2 days.  Glucose elevated 878 with elevated beta hydroxybutyrate acid of 5.53, anion gap of 31 and pH on VBG 7.2.  Patient  was started on insulin drip with improvement of glucose and closure of anion gap and was transitioned to subcutaneous insulin.  Patient has been prescribed Tresiba 15 units immensely daily and Humalog sliding scale outpatient but states has only been using 8 units of Tresiba.  Discussed with patient will increase Tresiba dose to 10 units daily and continue Humalog sliding scale.  Needs close outpatient follow-up with PCP.  ESRD on HD with hyperkalemia Potassium 6.5 on admission.  Nephrology was consulted and patient underwent hemodialysis with resolution of hyperkalemia.  Next HD session due on Tuesday 11/28.  Essential hypertension Recently started on amlodipine 10 mg p.o. daily.  Also recent hospitalization with orthostatic hypotension.  Recommend continue compression stockings, abdominal binder and midodrine as needed.  Outpatient follow-up with PCP.  Hyperlipidemia Noncompliance with atorvastatin, recommend restart on discharge.  CAD Continue aspirin and Plavix, but believe likely underlying noncompliance.  Discharge Diagnoses:  Principal Problem:   DKA (diabetic ketoacidosis) ([place]) Active Problems:   Hypercholesterolemia   Hypertension   Hyperkalemia   ESRD (end stage renal disease) [facility])    Discharge Instructions  Discharge Instructions     Call MD for:  difficulty breathing, headache or visual disturbances   Complete by: As directed    Call MD for:  extreme fatigue   Complete by: As directed    Call MD for:  persistant dizziness or light-headedness   Complete by: As directed    Call MD for:  persistant nausea and vomiting   Complete by: As directed    Call MD for:  severe uncontrolled pain   Complete by: As directed  Call MD for:  temperature >100.4   Complete by: As directed    Diet - low sodium heart healthy   Complete by: As directed    Increase activity slowly   Complete by: As directed    No wound care   Complete by: As directed       Allergies as of  08/27/2022       Reactions   Fexofenadine Rash   Latex Rash   Chlorhexidine Gluconate Other (See Comments)   Blisters        Medication List     TAKE these medications    amLODipine 10 MG tablet Commonly known as: NORVASC Take 10 mg by mouth daily.   aspirin EC 81 MG tablet Take 1 tablet (81 mg total) by mouth daily. Swallow whole. What changed: when to take this   atorvastatin 80 MG tablet Commonly known as: LIPITOR Take 1 tablet (80 mg total) by mouth daily.   Baqsimi One Pack 3 MG/DOSE Powd Generic drug: Glucagon Place 3 mg into the nose once as needed (low blood sugar).   calcitRIOL 0.5 MCG capsule Commonly known as: ROCALTROL Take 1 capsule (0.5 mcg total) by mouth Every Tuesday,Thursday,and Saturday with dialysis.   clopidogrel 75 MG tablet Commonly known as: PLAVIX Take 75 mg by mouth every morning.   FreeStyle Libre 2 Reader [employer] 1 box Diagnosis: Diabetes mellitus type 1, IDDM   FreeStyle Libre 2 Sensor Misc Diagnosis: Diabetes mellitus type 1, IDDM   insulin degludec 100 UNIT/ML FlexTouch Pen Commonly known as: TRESIBA Inject 10 Units into the skin at bedtime. What changed: how much to take   insulin lispro 100 UNIT/ML KwikPen Commonly known as: HUMALOG CORRECTION FACTOR: Sliding scale CBG 70 - 120: 0 units CBG 121 - 150: 0 unit,  CBG 151 - 200: 1 units,  CBG 201 - 250: 2 units,  CBG 251 - 300: 2 units,  CBG 301 - 350: 4 units,  CBG 351 - 400: 5 units   CBG > 400: 6 units and notify your MD   Insulin Pen Needle 32G X 4 MM Misc Use with lantus and humalog 4 imes per day   midodrine 2.5 MG tablet Commonly known as: PROAMATINE Take 1 tablet (2.5 mg total) by mouth 3 (three) times daily with meals.   ondansetron 4 MG disintegrating tablet Commonly known as: ZOFRAN-ODT Take 1 tablet (4 mg total) by mouth every 8 (eight) hours as needed for nausea or vomiting.   ONE TOUCH LANCETS Misc Use to check blood sugar 8 time(s) daily   OneTouch Verio  Flex System w/Device Kit by Does not apply route.   OneTouch Verio test strip Generic drug: glucose blood SMARTSIG:Via Meter 8 Times Daily   pantoprazole 40 MG tablet Commonly known as: Protonix Take 1 tablet (40 mg total) by mouth daily before breakfast.   Retacrit 10000 UNIT/ML injection Generic drug: epoetin alfa-epbx Inject 10,000 Units into the vein every 30 (thirty) days.   sertraline 50 MG tablet Commonly known as: ZOLOFT Take 1 tablet (50 mg total) by mouth daily.   sevelamer carbonate 800 MG tablet Commonly known as: RENVELA Take 800 mg by mouth 3 (three) times daily with meals.   Vitamin D 50 MCG (2000 UT) tablet Take 2,000 Units by mouth daily.        Follow-up Information     Louis Limbo, MD. Schedule an appointment as soon as possible for a visit in 1 week(s).   Specialty: Family Medicine Contact  information: [address] [place] [address] [phone]                Allergies  Allergen Reactions   Fexofenadine Rash   Latex Rash   Chlorhexidine Gluconate Other (See Comments)    Blisters    Consultations: Nephrology   Procedures/Studies: DG Chest 2 View  Result Date: 08/26/2022 CLINICAL DATA:  Shortness of breath. EXAM: CHEST - 2 VIEW COMPARISON:  July 10, 2022. FINDINGS: The heart size and mediastinal contours are within normal limits. Both lungs are clear. The visualized skeletal structures are unremarkable. IMPRESSION: No active cardiopulmonary disease. Electronically Signed   By: Marijo Conception M.D.   On: 08/26/2022 08:55     Subjective: Patient seen examined bedside, resting comfortably.  Lying in bed.  Glucose much better controlled and now transition to subcutaneous insulin.  Patient tolerating diet without any further nausea or vomiting.  Ready for discharge home.  Discussed increase Tresiba dose to 10 units daily.  No other specific questions or concerns at this time.  Denies headache, no dizziness,  no chest pain, no palpitations, no shortness of breath, no abdominal pain, no focal weakness, no fever/chills/night sweats, no nausea/vomiting/diarrhea, no paresthesias.  No acute events overnight per nurse staff.  Discharge Exam: Vitals:   08/27/22 0800 08/27/22 1000  BP: (!) 150/107 (!) 150/70  Pulse: 83 76  Resp: 16 18  Temp:    SpO2: 100% 100%   Vitals:   08/27/22 0200 08/27/22 0643 08/27/22 0800 08/27/22 1000  BP: 134/66 (!) 166/90 (!) 150/107 (!) 150/70  Pulse: 87 81 83 76  Resp: _0 Temp:  99.1 F (37.3 C)    TempSrc:  Oral    SpO2: 100% 100% 100% 100%  Weight:      Height:        Physical Exam: GEN: NAD, alert and oriented x 3, chronically ill appearance, appears older than stated age HEENT: NCAT, PERRL, EOMI, sclera clear, MMM, dentulous PULM: CTAB w/o wheezes/crackles, normal respiratory effort CV: RRR w/o M/G/R GI: abd soft, NTND, NABS, no R/G/M MSK: no peripheral edema, moves all extremities independently NEURO: CN II-XII intact, no focal deficits, sensation to light touch intact PSYCH: normal mood/affect Integumentary: dry/intact, no rashes or wounds    The results of significant diagnostics from this hospitalization (including imaging, microbiology, ancillary and laboratory) are listed below for reference.     Microbiology: No results found for this or any previous visit (from the past 240 hour(s)).   Labs: BNP (last 3 results) Recent Labs    02/18/22 2015  BNP 2,458.0*   Basic Metabolic Panel: Recent Labs  Lab 08/26/22 1010 08/26/22 1018 08/26/22 1500 08/26/22 1811 08/26/22 2307 08/27/22 0236  NA 131* 127* 133* 139 138 136  K 6.2* 6.0* 4.8 3.9 3.7 4.0  CL 84*  --  90* 94* 97* 95*  CO2 16*  --  _1 GLUCOSE 883*  --  439* 156* 140* 236*  BUN 90*  --  91* 49* 35* 40*  CREATININE 9.53*  --  9.07* 5.25* 4.57* 5.20*  CALCIUM 9.6  --  9.7 9.4 8.7* 9.0  PHOS  --   --   --  3.6  --   --    Liver Function Tests: Recent Labs   Lab 08/26/22 0622 08/26/22 1811  AST 32  --   ALT 31  --   ALKPHOS 92  --   BILITOT 1.2  --  PROT 7.1  --   ALBUMIN 4.0 3.6   No results for input(s): "LIPASE", "AMYLASE" in the last 168 hours. No results for input(s): "AMMONIA" in the last 168 hours. CBC: Recent Labs  Lab 08/26/22 0622 08/26/22 1018  WBC 15.7*  --   HGB 11.0* 11.9*  HCT 37.0* 35.0*  MCV 102.5*  --   PLT 364  --    Cardiac Enzymes: No results for input(s): "CKTOTAL", "CKMB", "CKMBINDEX", "TROPONINI" in the last 168 hours. BNP: Invalid input(s): "POCBNP" CBG: Recent Labs  Lab 08/26/22 2226 08/26/22 2252 08/27/22 0009 08/27/22 0104 08/27/22 0756  GLUCAP 138* 131* 226* 206* 154*   D-Dimer No results for input(s): "DDIMER" in the last 72 hours. Hgb A1c No results for input(s): "HGBA1C" in the last 72 hours. Lipid Profile No results for input(s): "CHOL", "HDL", "LDLCALC", "TRIG", "CHOLHDL", "LDLDIRECT" in the last 72 hours. Thyroid function studies No results for input(s): "TSH", "T4TOTAL", "T3FREE", "THYROIDAB" in the last 72 hours.  Invalid input(s): "FREET3" Anemia work up No results for input(s): "VITAMINB12", "FOLATE", "FERRITIN", "TIBC", "IRON", "RETICCTPCT" in the last 72 hours. Urinalysis    Component Value Date/Time   COLORURINE YELLOW 02/18/2022 [place] 02/18/2022 1840   LABSPEC 1.015 02/18/2022 1840   PHURINE 5.0 02/18/2022 1840   GLUCOSEU >=500 (A) 02/18/2022 1840   HGBUR SMALL (A) 02/18/2022 [place] NEGATIVE 02/18/2022 1840   KETONESUR NEGATIVE 02/18/2022 1840   PROTEINUR >=300 (A) 02/18/2022 1840   NITRITE NEGATIVE 02/18/2022 1840   LEUKOCYTESUR NEGATIVE 02/18/2022 1840   Sepsis Labs Recent Labs  Lab 08/26/22 0622  WBC 15.7*   Microbiology No results found for this or any previous visit (from the past 240 hour(s)).   Time coordinating discharge: Over 30 minutes  SIGNED:   Lashayla Armes J [place] DO  [facility] 08/27/2022, 11:34  AM

## 2022-08-27 NOTE — ED Notes (Signed)
Bed placement notified of patient disposition changing to discharge per [place] MD.

## 2022-08-27 NOTE — ED Notes (Signed)
AVS with prescriptions provided to and discussed with patient and family member at bedside. Pt verbalizes understanding of discharge instructions and denies any questions or concerns at this time. Pt has ride home. Pt ambulated out of department independently with steady gait.  

## 2022-08-28 LAB — HEPATITIS B SURFACE ANTIBODY, QUANTITATIVE: Hep B S AB Quant (Post): 13.6 m[IU]/mL (ref >9.9)
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
## 2022-10-11 ENCOUNTER — Encounter (INDEPENDENT_AMBULATORY_CARE_PROVIDER_SITE_OTHER): Payer: Medicare Other | Admitting: Ophthalmology

## 2022-10-11 DIAGNOSIS — E113521 Type 2 diabetes mellitus with proliferative diabetic retinopathy with traction retinal detachment involving the macula, right eye: Secondary | ICD-10-CM

## 2022-10-11 DIAGNOSIS — E113512 Type 2 diabetes mellitus with proliferative diabetic retinopathy with macular edema, left eye: Secondary | ICD-10-CM

## 2022-10-11 DIAGNOSIS — I10 Essential (primary) hypertension: Secondary | ICD-10-CM

## 2022-10-11 DIAGNOSIS — H35033 Hypertensive retinopathy, bilateral: Secondary | ICD-10-CM

## 2022-10-11 DIAGNOSIS — H25813 Combined forms of age-related cataract, bilateral: Secondary | ICD-10-CM

## 2022-11-17 ENCOUNTER — Ambulatory Visit (INDEPENDENT_AMBULATORY_CARE_PROVIDER_SITE_OTHER): Payer: Medicare Other | Admitting: Ophthalmology

## 2022-11-17 ENCOUNTER — Encounter (INDEPENDENT_AMBULATORY_CARE_PROVIDER_SITE_OTHER): Payer: Self-pay | Admitting: Ophthalmology

## 2022-11-17 DIAGNOSIS — H35033 Hypertensive retinopathy, bilateral: Secondary | ICD-10-CM | POA: Diagnosis not present

## 2022-11-17 DIAGNOSIS — E113521 Type 2 diabetes mellitus with proliferative diabetic retinopathy with traction retinal detachment involving the macula, right eye: Secondary | ICD-10-CM

## 2022-11-17 DIAGNOSIS — E113512 Type 2 diabetes mellitus with proliferative diabetic retinopathy with macular edema, left eye: Secondary | ICD-10-CM

## 2022-11-17 DIAGNOSIS — H25813 Combined forms of age-related cataract, bilateral: Secondary | ICD-10-CM

## 2022-11-17 DIAGNOSIS — I10 Essential (primary) hypertension: Secondary | ICD-10-CM | POA: Diagnosis not present
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
## 2023-04-17 ENCOUNTER — Encounter (INDEPENDENT_AMBULATORY_CARE_PROVIDER_SITE_OTHER): Payer: Self-pay | Admitting: Ophthalmology

## 2023-04-17 ENCOUNTER — Ambulatory Visit (INDEPENDENT_AMBULATORY_CARE_PROVIDER_SITE_OTHER): Payer: Medicare Other | Admitting: Ophthalmology

## 2023-04-17 DIAGNOSIS — I10 Essential (primary) hypertension: Secondary | ICD-10-CM | POA: Diagnosis not present

## 2023-04-17 DIAGNOSIS — Z794 Long term (current) use of insulin: Secondary | ICD-10-CM

## 2023-04-17 DIAGNOSIS — H25813 Combined forms of age-related cataract, bilateral: Secondary | ICD-10-CM

## 2023-04-17 DIAGNOSIS — E113521 Type 2 diabetes mellitus with proliferative diabetic retinopathy with traction retinal detachment involving the macula, right eye: Secondary | ICD-10-CM | POA: Diagnosis not present

## 2023-04-17 DIAGNOSIS — H35033 Hypertensive retinopathy, bilateral: Secondary | ICD-10-CM

## 2023-04-17 DIAGNOSIS — E113512 Type 2 diabetes mellitus with proliferative diabetic retinopathy with macular edema, left eye: Secondary | ICD-10-CM | POA: Diagnosis not present

## 2023-04-17 DIAGNOSIS — Z961 Presence of intraocular lens: Secondary | ICD-10-CM

## 2023-04-17 DIAGNOSIS — H25812 Combined forms of age-related cataract, left eye: Secondary | ICD-10-CM

## 2023-04-19 ENCOUNTER — Encounter (INDEPENDENT_AMBULATORY_CARE_PROVIDER_SITE_OTHER): Payer: Self-pay | Admitting: Ophthalmology

## 2023-06-19 ENCOUNTER — Telehealth: Payer: Self-pay

## 2023-06-19 NOTE — Telephone Encounter (Signed)
Attempted to reach pt to schedule his fistulogram on both numbers listed. Left VM on pt's cell for him to call us back.

## 2023-06-20 ENCOUNTER — Other Ambulatory Visit: Payer: Self-pay

## 2023-06-20 DIAGNOSIS — N186 End stage renal disease: Secondary | ICD-10-CM

## 2023-06-20 MED ORDER — SODIUM CHLORIDE 0.9% FLUSH: 3.0000 mL | Freq: Two times a day (BID) | INTRAVENOUS | Status: AC

## 2023-06-20 MED ORDER — SODIUM CHLORIDE 0.9 % IV SOLN: 250.0000 mL | INTRAVENOUS | Status: AC | PRN

## 2023-06-20 NOTE — Telephone Encounter (Signed)
Patient returned call. Scheduled right arm fistulogram for 06/26/23. Instructions provided and he voiced understanding.

## 2023-06-26 ENCOUNTER — Ambulatory Visit (HOSPITAL_COMMUNITY)
Admission: RE | Admit: 2023-06-26 | Discharge: 2023-06-26 | Disposition: A | Discharge status: Home / Self Care | Payer: Medicare Other | Attending: Surgery | Admitting: Surgery

## 2023-06-26 ENCOUNTER — Other Ambulatory Visit: Payer: Self-pay

## 2023-06-26 ENCOUNTER — Encounter (HOSPITAL_COMMUNITY)
Admission: RE | Disposition: A | Discharge status: Home / Self Care | Payer: Self-pay | Source: Home / Self Care | Attending: Surgery

## 2023-06-26 DIAGNOSIS — Y832 Surgical operation with anastomosis, bypass or graft as the cause of abnormal reaction of the patient, or of later complication, without mention of misadventure at the time of the procedure: Secondary | ICD-10-CM | POA: Insufficient documentation

## 2023-06-26 DIAGNOSIS — Z992 Dependence on renal dialysis: Secondary | ICD-10-CM | POA: Diagnosis not present

## 2023-06-26 DIAGNOSIS — T82858A Stenosis of vascular prosthetic devices, implants and grafts, initial encounter: Secondary | ICD-10-CM | POA: Diagnosis present

## 2023-06-26 DIAGNOSIS — T82898A Other specified complication of vascular prosthetic devices, implants and grafts, initial encounter: Secondary | ICD-10-CM

## 2023-06-26 DIAGNOSIS — N185 Chronic kidney disease, stage 5: Secondary | ICD-10-CM | POA: Diagnosis not present

## 2023-06-26 DIAGNOSIS — N186 End stage renal disease: Secondary | ICD-10-CM | POA: Insufficient documentation

## 2023-06-26 HISTORY — PX: A/V FISTULAGRAM: CATH118298

## 2023-06-26 HISTORY — PX: PERIPHERAL VASCULAR INTERVENTION: CATH118257

## 2023-06-26 HISTORY — PX: PERIPHERAL VASCULAR BALLOON ANGIOPLASTY: CATH118281

## 2023-06-26 HISTORY — PX: PERIPHERAL VASCULAR ATHERECTOMY: CATH118256

## 2023-06-26 LAB — GLUCOSE, CAPILLARY
Glucose-Capillary: 319 mg/dL — ABNORMAL HIGH (ref 70–99)
Glucose-Capillary: 483 mg/dL — ABNORMAL HIGH (ref 70–99)
Glucose-Capillary: 567 mg/dL (ref 70–99)

## 2023-06-26 LAB — NO BLOOD PRODUCTS

## 2023-06-26 LAB — POCT I-STAT, CHEM 8
BUN: 39 mg/dL — ABNORMAL HIGH (ref 6–20)
Calcium, Ion: 1.17 mmol/L (ref 1.15–1.40)
Chloride: 94 mmol/L — ABNORMAL LOW (ref 98–111)
Creatinine, Ser: 8.9 mg/dL — ABNORMAL HIGH (ref 0.61–1.24)
Glucose, Bld: 336 mg/dL — ABNORMAL HIGH (ref 70–99)
HCT: 39 % (ref 39.0–52.0)
Hemoglobin: 13.3 g/dL (ref 13.0–17.0)
Potassium: 4.5 mmol/L (ref 3.5–5.1)
Sodium: 133 mmol/L — ABNORMAL LOW (ref 135–145)
TCO2: 30 mmol/L (ref 22–32)

## 2023-06-26 SURGERY — A/V FISTULAGRAM
Anesthesia: LOCAL | Laterality: Right

## 2023-06-26 MED ORDER — INSULIN ASPART 100 UNIT/ML IJ SOLN
10.0000 [IU] | Freq: Once | INTRAMUSCULAR | Status: AC
  Administered 2023-06-26: 10 [IU] via SUBCUTANEOUS
  Filled 2023-06-26: qty 1

## 2023-06-26 MED ORDER — HEPARIN (PORCINE) IN NACL 1000-0.9 UT/500ML-% IV SOLN
INTRAVENOUS | Status: DC | PRN
  Administered 2023-06-26: 500 mL

## 2023-06-26 MED ORDER — IODIXANOL 320 MG/ML IV SOLN
INTRAVENOUS | Status: DC | PRN
  Administered 2023-06-26: 40 mL

## 2023-06-26 MED ORDER — FENTANYL CITRATE (PF) 100 MCG/2ML IJ SOLN
INTRAMUSCULAR | Status: DC | PRN
  Administered 2023-06-26: 25 ug via INTRAVENOUS

## 2023-06-26 MED ORDER — MIDAZOLAM HCL 2 MG/2ML IJ SOLN
INTRAMUSCULAR | Status: DC | PRN
  Administered 2023-06-26: 1 mg via INTRAVENOUS

## 2023-06-26 MED ORDER — FENTANYL CITRATE (PF) 100 MCG/2ML IJ SOLN
INTRAMUSCULAR | Status: AC
  Filled 2023-06-26: qty 2

## 2023-06-26 MED ORDER — LIDOCAINE HCL (PF) 1 % IJ SOLN
INTRAMUSCULAR | Status: DC | PRN
  Administered 2023-06-26: 3 mL

## 2023-06-26 MED ORDER — SODIUM CHLORIDE 0.9% FLUSH: 3.0000 mL | INTRAVENOUS | Status: DC | PRN

## 2023-06-26 MED ORDER — LIDOCAINE HCL (PF) 1 % IJ SOLN
INTRAMUSCULAR | Status: AC
  Filled 2023-06-26: qty 30

## 2023-06-26 MED ORDER — MIDAZOLAM HCL 2 MG/2ML IJ SOLN
INTRAMUSCULAR | Status: AC
  Filled 2023-06-26: qty 2

## 2023-06-26 SURGICAL SUPPLY — 21 items
BALLN MUSTANG 7.0X40 75 (BALLOONS) ×2
BALLN MUSTANG 8X20X75 (BALLOONS) ×2
BALLN MUSTANG 9X20X75 (BALLOONS) ×2
BALLOON MUSTANG 7.0X40 75 (BALLOONS) IMPLANT
BALLOON MUSTANG 8X20X75 (BALLOONS) IMPLANT
BALLOON MUSTANG 9X20X75 (BALLOONS) IMPLANT
CATH AURYON ATHERECTOMY 2.0 (CATHETERS) IMPLANT
CATH STRAIGHT 5FR 65CM (CATHETERS) IMPLANT
COVER DOME SNAP 22 D (MISCELLANEOUS) ×2 IMPLANT
GUIDEWIRE ANGLED .035X150CM (WIRE) IMPLANT
KIT ENCORE 26 ADVANTAGE (KITS) IMPLANT
KIT MICROPUNCTURE NIT STIFF (SHEATH) IMPLANT
SHEATH PINNACLE R/O II 6F 4CM (SHEATH) IMPLANT
SHEATH PROBE COVER 6X72 (BAG) ×2 IMPLANT
STENT VIABAHN 7X29 6FR 80 (Permanent Stent) IMPLANT
STOPCOCK MORSE 400PSI 3WAY (MISCELLANEOUS) ×2 IMPLANT
TRAY PV CATH (CUSTOM PROCEDURE TRAY) ×2 IMPLANT
TUBING CIL FLEX 10 FLL-RA (TUBING) ×2 IMPLANT
WIRE BENTSON .035X145CM (WIRE) IMPLANT
WIRE ROSEN-J .035X180CM (WIRE) IMPLANT
WIRE SPARTACORE .014X300CM (WIRE) IMPLANT

## 2023-06-26 NOTE — H&P (Signed)
Patient name: Louis Peters MRN: [id number] DOB: 02-05-1966 Sex: male   HISTORY OF PRESENT ILLNESS:   Louis Peters is a 57 y.o. male with history of left brachiocephalic fistula.  He is having trouble with dialysis and so comes in for fistulogram  CURRENT MEDICATIONS:    Current Facility-Administered Medications  Medication Dose Route Frequency Provider Last Rate Last Admin   sodium chloride flush (NS) 0.9 % injection 3 mL  3 mL Intravenous PRN Nada Libman, MD        REVIEW OF SYSTEMS:   [X]  denotes positive finding, [ ]  denotes negative finding Cardiac  Comments:  Chest pain or chest pressure:    Shortness of breath upon exertion:    Short of breath when lying flat:    Irregular heart rhythm:    Constitutional    Fever or chills:      PHYSICAL EXAM:   Vitals:   06/26/23 0744  BP: (!) 155/95  Pulse: 90  Resp: 18  Temp: (!) 97.2 F (36.2 C)  TempSrc: Temporal  SpO2: 100%  Weight: 63.5 kg  Height: 5\' 10"  (1.778 m)    GENERAL: The patient is a well-nourished male, in no acute distress. The vital signs are documented above. CARDIOVASCULAR: There is a regular rate and rhythm. PULMONARY: Non-labored respirations   STUDIES:      MEDICAL ISSUES:   Plan for left arm fistulogram and intervention if indicated.  Charlena Cross, MD, FACS [facility] [phone] Pager [phone]

## 2023-06-26 NOTE — Progress Notes (Addendum)
MD notified of CBG of 483. Orders given to give 10 units of insulin. CBG rechecked at 1255, CBG 567. MD notified, ordered to give another 10 units of insulin. MD gave verbal to discharge home after second dose of insulin given.

## 2023-06-26 NOTE — Progress Notes (Signed)
I messaged Richardean Canal RN / cath lab with glucose results. Will wait for further orders.

## 2023-06-26 NOTE — Op Note (Signed)
Patient name: Louis Peters MRN: [id number] DOB: 1966/04/24 Sex: male  06/26/2023 Pre-operative Diagnosis: End-stage renal disease Post-operative diagnosis:  Same Surgeon:  Durene Cal Procedure Performed:  1.  Ultrasound-guided access, right cephalic vein  2.  Fistulogram  3.  Angioplasty, right cephalic vein  4.  Laser atherectomy, right cephalic vein  5.  Stent, right cephalic vein  6.  Conscious sedation, 52 minutes    Indications: This is a 57 year old gentleman with poorly functioning right arm fistula.  He comes in today for further evaluation  Procedure:  The patient was identified in the holding area and taken to room 8.  The patient was then placed supine on the table and prepped and draped in the usual sterile fashion.  A time out was called.  Conscious sedation was administered with the use of IV fentanyl and Versed under continuous physician and nurse monitoring.  Heart rate, blood pressure, and oxygen saturations were continuously monitored.  Total sedation time was 52 minutes ultrasound was used to evaluate the fistula.  The vein was patent and compressible.  A digital ultrasound image was acquired.  The fistula was then accessed under ultrasound guidance using a micropuncture needle.  An 018 wire was then asvanced without resistance and a micropuncture sheath was placed.  Contrast injections were then performed through the sheath.  Findings: The central venous system on initial imaging does not opacify very well.  There is a significant stenosis at the leading edge of the cephalic vein stent.  The arterial venous anastomosis is patent as is the residual portion of the fistula   Intervention: After the above images were acquired the decision was made to proceed with intervention.  A 6 French sheath was placed.  I first elected to balloon the leading edge of the stent with a 7 mm Mustang balloon.  I was unable to get rid of the waist within the balloon.  Because this was  in-stent stenosis and it did not respond at all to balloon venoplasty, I felt that atherectomy was going to be necessary to try and debulk the lesion.  Over a [place] core wire, a 2.3 laser catheter was inserted and laser atherectomy was performed with the in-stent stenosis.  Multiple passes were made.  Next, I upsized to a 8 mm balloon and repeated balloon venoplasty of the recently treated laser atherectomy lesion.  Unfortunately, there was still a residual waist within the balloon.  I felt that now the only option left to treat this was going to be stenting.  I elected to use a covered stent because I wanted to make sure I could postdilated without concerns of rupturing the balloon.  A 8 x 29 VBX stent was then deployed.  Again there was still a residual narrowing and so I inserted a 9 x 20 Mustang balloon to treat this area.  Again there was still residual stenosis.  After intervention, this was now down to about 20%.  I do not see any other options to treat this.  His next option would be surgical revision.  Impression:  #1  Resistant stenosis to the leading edge of a previously placed stent.  I treated this initially with balloon venoplasty which was unsuccessful and so laser atherectomy was then performed with angioplasty which was unsuccessful.  I then ended up placing a covered stent and post dilating it with a 9 balloon.  There is still residual stenosis about 25%  #2  If the patient continues to have difficulty,  surgical revision would be the next option.    Juleen China, M.D., [facility] [place] Office: [phone] Pager:  [phone]

## 2023-06-27 ENCOUNTER — Encounter (HOSPITAL_COMMUNITY): Payer: Self-pay | Admitting: Surgery

## 2023-09-01 ENCOUNTER — Encounter (HOSPITAL_COMMUNITY): Payer: Self-pay

## 2023-09-01 ENCOUNTER — Other Ambulatory Visit: Payer: Self-pay

## 2023-09-01 ENCOUNTER — Emergency Department (HOSPITAL_COMMUNITY)
Admission: EM | Admit: 2023-09-01 | Discharge: 2023-09-01 | Disposition: A | Discharge status: Home / Self Care | Payer: Medicare Other | Attending: Emergency Medicine | Admitting: Emergency Medicine

## 2023-09-01 DIAGNOSIS — E162 Hypoglycemia, unspecified: Secondary | ICD-10-CM | POA: Diagnosis present

## 2023-09-01 DIAGNOSIS — Z794 Long term (current) use of insulin: Secondary | ICD-10-CM | POA: Diagnosis not present

## 2023-09-01 DIAGNOSIS — E10649 Type 1 diabetes mellitus with hypoglycemia without coma: Secondary | ICD-10-CM | POA: Diagnosis not present

## 2023-09-01 DIAGNOSIS — N189 Chronic kidney disease, unspecified: Secondary | ICD-10-CM | POA: Insufficient documentation

## 2023-09-01 DIAGNOSIS — Z91040 Latex allergy status: Secondary | ICD-10-CM | POA: Diagnosis not present

## 2023-09-01 DIAGNOSIS — I129 Hypertensive chronic kidney disease with stage 1 through stage 4 chronic kidney disease, or unspecified chronic kidney disease: Secondary | ICD-10-CM | POA: Insufficient documentation

## 2023-09-01 LAB — BASIC METABOLIC PANEL
Anion gap: 14 (ref 5–15)
BUN: 46 mg/dL — ABNORMAL HIGH (ref 6–20)
CO2: 31 mmol/L (ref 22–32)
Calcium: 10.2 mg/dL (ref 8.9–10.3)
Chloride: 89 mmol/L — ABNORMAL LOW (ref 98–111)
Creatinine, Ser: 9.64 mg/dL — ABNORMAL HIGH (ref 0.61–1.24)
GFR, Estimated: 6 mL/min — ABNORMAL LOW (ref >60)
Glucose, Bld: 132 mg/dL — ABNORMAL HIGH (ref 70–99)
Potassium: 3.9 mmol/L (ref 3.5–5.1)
Sodium: 134 mmol/L — ABNORMAL LOW (ref 135–145)

## 2023-09-01 LAB — CBG MONITORING, ED
Glucose-Capillary: 106 mg/dL — ABNORMAL HIGH (ref 70–99)
Glucose-Capillary: 114 mg/dL — ABNORMAL HIGH (ref 70–99)
Glucose-Capillary: 167 mg/dL — ABNORMAL HIGH (ref 70–99)
Glucose-Capillary: 199 mg/dL — ABNORMAL HIGH (ref 70–99)

## 2023-09-01 LAB — CBC WITH DIFFERENTIAL/PLATELET
Abs Immature Granulocytes: 0.04 10*3/uL (ref 0.00–0.07)
Basophils Absolute: 0.1 10*3/uL (ref 0.0–0.1)
Basophils Relative: 1 %
Eosinophils Absolute: 0 10*3/uL (ref 0.0–0.5)
Eosinophils Relative: 0 %
HCT: 38.4 % — ABNORMAL LOW (ref 39.0–52.0)
Hemoglobin: 12.4 g/dL — ABNORMAL LOW (ref 13.0–17.0)
Immature Granulocytes: 1 %
Lymphocytes Relative: 15 %
Lymphs Abs: 1.3 10*3/uL (ref 0.7–4.0)
MCH: 31.9 pg (ref 26.0–34.0)
MCHC: 32.3 g/dL (ref 30.0–36.0)
MCV: 98.7 fL (ref 80.0–100.0)
Monocytes Absolute: 0.8 10*3/uL (ref 0.1–1.0)
Monocytes Relative: 9 %
Neutro Abs: 6.3 10*3/uL (ref 1.7–7.7)
Neutrophils Relative %: 74 %
Platelets: 292 10*3/uL (ref 150–400)
RBC: 3.89 MIL/uL — ABNORMAL LOW (ref 4.22–5.81)
RDW: 15.8 % — ABNORMAL HIGH (ref 11.5–15.5)
WBC: 8.5 10*3/uL (ref 4.0–10.5)
nRBC: 0 % (ref 0.0–0.2)

## 2023-09-01 NOTE — ED Provider Notes (Signed)
[place] EMERGENCY DEPARTMENT AT [facility] Provider Note   CSN: [id number] Arrival date & time: 09/01/23  1015     History  Chief Complaint  Patient presents with   Hypoglycemia    Louis Peters is a 57 y.o. male.  Patient with history of T1DM, HTN, HLD, CKD presents after his blood sugar dropped to 50 this morning. He reports not eating last night or having breakfast this morning. States this happens frequently. EMS was called and provided IV D10, with improvement in CBG to 190. The patient reports feeling at his baseline currently. He states he stopped taking his regularly dosed nighttime insulin because of recurrently low blood sugar and is only on sliding scale insulin through the day.   The history is provided by the patient. No language interpreter was used.  Hypoglycemia      Home Medications Prior to Admission medications   Medication Sig Start Date End Date Taking? Authorizing Provider  hydrALAZINE (APRESOLINE) 100 MG tablet Take 100 mg by mouth 3 (three) times daily.   Yes [provider]  insulin degludec (TRESIBA) 100 UNIT/ML FlexTouch Pen Inject 10 Units into the skin at bedtime. 08/27/22  Yes [place], Eric J, DO  insulin lispro (HUMALOG) 100 UNIT/ML KwikPen CORRECTION FACTOR: Sliding scale CBG 70 - 120: 0 units CBG 121 - 150: 0 unit,  CBG 151 - 200: 1 units,  CBG 201 - 250: 2 units,  CBG 251 - 300: 2 units,  CBG 301 - 350: 4 units,  CBG 351 - 400: 5 units   CBG > 400: 6 units and notify your MD Patient taking differently: Inject into the skin 3 (three) times daily. CORRECTION FACTOR: Sliding scale CBG 70 - 120: 0 units CBG 121 - 150: 0 unit,  CBG 151 - 200: 1 units,  CBG 201 - 250: 2 units,  CBG 251 - 300: 2 units,  CBG 301 - 350: 4 units,  CBG 351 - 400: 5 units   CBG > 400: 6 units and notify your MD 07/12/22  Yes Rai, Ripudeep K, MD  Blood Glucose Monitoring Suppl (ONETOUCH VERIO FLEX SYSTEM) w/Device KIT by Does not apply route. 07/31/18    [provider]  Continuous Blood Gluc Receiver (FREESTYLE LIBRE 2 READER) DEVI 1 box Diagnosis: Diabetes mellitus type 1, IDDM 07/12/22   Rai, Delene Ruffini, MD  Continuous Blood Gluc Sensor (FREESTYLE LIBRE 2 SENSOR) MISC Diagnosis: Diabetes mellitus type 1, IDDM 07/12/22   Rai, Delene Ruffini, MD  Insulin Pen Needle 32G X 4 MM MISC Use with lantus and humalog 4 imes per day 07/12/22   Rai, Delene Ruffini, MD  moxifloxacin (VIGAMOX) 0.5 % ophthalmic solution Place 1 drop into the left eye. Patient not taking: Reported on 09/01/2023 08/07/23   [provider]  ONE TOUCH LANCETS MISC Use to check blood sugar 8 time(s) daily 07/12/22   Rai, Delene Ruffini, MD  [facility] VERIO test strip SMARTSIG:Via Meter 8 Times Daily 07/12/22   Rai, Ripudeep K, MD  prednisoLONE acetate (PRED FORTE) 1 % ophthalmic suspension Place 1 drop into the left eye. Patient not taking: Reported on 09/01/2023 08/07/23   [provider]      Allergies    Allegra [fexofenadine], Latex, and Chlorhexidine gluconate    Review of Systems   Review of Systems  Physical Exam Updated Vital Signs BP 132/78   Pulse 78   Temp 98.6 F (37 C)   Resp 15   Ht 5\' 10"  (  1.778 m)   Wt 78.5 kg   SpO2 100%   BMI 24.82 kg/m  Physical Exam Vitals and nursing note reviewed.  Constitutional:      Appearance: Normal appearance.  Cardiovascular:     Rate and Rhythm: Normal rate.  Pulmonary:     Effort: Pulmonary effort is normal.  Abdominal:     General: There is no distension.     Palpations: Abdomen is soft.  Musculoskeletal:        General: Normal range of motion.  Skin:    General: Skin is warm and dry.  Neurological:     General: No focal deficit present.     Mental Status: He is alert and oriented to person, place, and time.     ED Results / Procedures / Treatments   Labs (all labs ordered are listed, but only abnormal results are displayed) Labs Reviewed  CBC WITH DIFFERENTIAL/PLATELET - Abnormal;  Notable for the following components:      Result Value   RBC 3.89 (*)    Hemoglobin 12.4 (*)    HCT 38.4 (*)    RDW 15.8 (*)    All other components within normal limits  BASIC METABOLIC PANEL - Abnormal; Notable for the following components:   Sodium 134 (*)    Chloride 89 (*)    Glucose, Bld 132 (*)    BUN 46 (*)    Creatinine, Ser 9.64 (*)    GFR, Estimated 6 (*)    All other components within normal limits  CBG MONITORING, ED - Abnormal; Notable for the following components:   Glucose-Capillary 106 (*)    All other components within normal limits  CBG MONITORING, ED - Abnormal; Notable for the following components:   Glucose-Capillary 114 (*)    All other components within normal limits  CBG MONITORING, ED - Abnormal; Notable for the following components:   Glucose-Capillary 167 (*)    All other components within normal limits  CBG MONITORING, ED - Abnormal; Notable for the following components:   Glucose-Capillary 199 (*)    All other components within normal limits    EKG None  Radiology No results found.  Procedures Procedures    Medications Ordered in ED Medications - No data to display  ED Course/ Medical Decision Making/ A&P                                 Medical Decision Making This patient presents to the ED for concern of hypoglycemia, this involves an extensive number of treatment options, and is a complaint that carries with it a high risk of complications and morbidity.  The differential diagnosis includes insulin overdose,    Co morbidities that complicate the patient evaluation  IDDM, HTN, HLD, CKD   Additional history obtained:  Additional history and/or information obtained from chart review, notable for medical records reviewed   Lab Tests:  I Ordered, and personally interpreted labs.  The pertinent results include:  initial CBG 106, 114, 167 (after meal).     Test Considered:  Frequent CBG to monitor for  hypoglycemia   Critical Interventions:  IV started, patient on monitor, given full meal.      Problem List / ED Course:  Patient to ED with low blood sugar. Fully responsive, feels much better after D10 given by EMS. Has had a full meal here. CBG 167. Will monitor for 2-3 additional hours with q 45  minute CBG's.    Reevaluation:  After the interventions noted above, I reevaluated the patient and found that they have :improved   Social Determinants of Health:  Good family support   Disposition:  After consideration of the diagnostic results and the patients response to treatment, I feel that the patient would benefit from discharge home. Discussed return precautions. .   Amount and/or Complexity of Data Reviewed Labs: ordered.           Final Clinical Impression(s) / ED Diagnoses Final diagnoses:  Hypoglycemia    Rx / DC Orders ED Discharge Orders     None         Elpidio Anis, PA-C 09/02/23 1521    Franne Forts, DO 09/07/23 2354

## 2023-09-01 NOTE — ED Triage Notes (Signed)
Pt coming from home. Pt had a hypoglycemic episode initial sugar 50, given 22g of D10. Last sugar with EMS was 190, now alert and oriented. Family says he has had multiple episodes of this lately and wanted him assessed at the ER.

## 2023-09-01 NOTE — Discharge Instructions (Signed)
Please follow up with your diabetes doctor further tighter control of blood sugar. Return to the ED with any new or concerning symptoms at any time.
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
## 2023-10-18 ENCOUNTER — Ambulatory Visit (INDEPENDENT_AMBULATORY_CARE_PROVIDER_SITE_OTHER): Payer: Medicare Other | Admitting: Ophthalmology

## 2023-10-18 ENCOUNTER — Encounter (INDEPENDENT_AMBULATORY_CARE_PROVIDER_SITE_OTHER): Payer: Self-pay | Admitting: Ophthalmology

## 2023-10-18 DIAGNOSIS — H35033 Hypertensive retinopathy, bilateral: Secondary | ICD-10-CM

## 2023-10-18 DIAGNOSIS — I10 Essential (primary) hypertension: Secondary | ICD-10-CM

## 2023-10-18 DIAGNOSIS — E113521 Type 2 diabetes mellitus with proliferative diabetic retinopathy with traction retinal detachment involving the macula, right eye: Secondary | ICD-10-CM | POA: Diagnosis not present

## 2023-10-18 DIAGNOSIS — H25812 Combined forms of age-related cataract, left eye: Secondary | ICD-10-CM

## 2023-10-18 DIAGNOSIS — E113512 Type 2 diabetes mellitus with proliferative diabetic retinopathy with macular edema, left eye: Secondary | ICD-10-CM

## 2023-10-18 DIAGNOSIS — Z794 Long term (current) use of insulin: Secondary | ICD-10-CM

## 2023-10-18 DIAGNOSIS — Z961 Presence of intraocular lens: Secondary | ICD-10-CM

## 2023-10-23 ENCOUNTER — Ambulatory Visit (HOSPITAL_COMMUNITY)
Admission: RE | Admit: 2023-10-23 | Discharge: 2023-10-23 | Disposition: A | Discharge status: Home / Self Care | Payer: Medicare Other | Attending: Nephrology | Admitting: Nephrology

## 2023-10-23 ENCOUNTER — Other Ambulatory Visit: Payer: Self-pay

## 2023-10-23 ENCOUNTER — Encounter (HOSPITAL_COMMUNITY)
Admission: RE | Disposition: A | Discharge status: Home / Self Care | Payer: Self-pay | Source: Home / Self Care | Attending: Nephrology

## 2023-10-23 ENCOUNTER — Encounter (HOSPITAL_COMMUNITY): Payer: Self-pay | Admitting: Nephrology

## 2023-10-23 DIAGNOSIS — N186 End stage renal disease: Secondary | ICD-10-CM | POA: Diagnosis present

## 2023-10-23 DIAGNOSIS — I120 Hypertensive chronic kidney disease with stage 5 chronic kidney disease or end stage renal disease: Secondary | ICD-10-CM | POA: Diagnosis not present

## 2023-10-23 DIAGNOSIS — Z833 Family history of diabetes mellitus: Secondary | ICD-10-CM | POA: Diagnosis not present

## 2023-10-23 DIAGNOSIS — Y832 Surgical operation with anastomosis, bypass or graft as the cause of abnormal reaction of the patient, or of later complication, without mention of misadventure at the time of the procedure: Secondary | ICD-10-CM | POA: Diagnosis not present

## 2023-10-23 DIAGNOSIS — Z992 Dependence on renal dialysis: Secondary | ICD-10-CM | POA: Insufficient documentation

## 2023-10-23 DIAGNOSIS — Z794 Long term (current) use of insulin: Secondary | ICD-10-CM | POA: Insufficient documentation

## 2023-10-23 DIAGNOSIS — E1022 Type 1 diabetes mellitus with diabetic chronic kidney disease: Secondary | ICD-10-CM | POA: Insufficient documentation

## 2023-10-23 DIAGNOSIS — T82858A Stenosis of vascular prosthetic devices, implants and grafts, initial encounter: Secondary | ICD-10-CM | POA: Insufficient documentation

## 2023-10-23 HISTORY — PX: PERIPHERAL VASCULAR BALLOON ANGIOPLASTY: CATH118281

## 2023-10-23 HISTORY — PX: A/V FISTULAGRAM: CATH118298

## 2023-10-23 SURGERY — A/V FISTULAGRAM
Anesthesia: LOCAL

## 2023-10-23 MED ORDER — FENTANYL CITRATE (PF) 100 MCG/2ML IJ SOLN
INTRAMUSCULAR | Status: AC
  Filled 2023-10-23: qty 2

## 2023-10-23 MED ORDER — HEPARIN (PORCINE) IN NACL 1000-0.9 UT/500ML-% IV SOLN
INTRAVENOUS | Status: DC | PRN
  Administered 2023-10-23: 500 mL

## 2023-10-23 MED ORDER — MIDAZOLAM HCL 2 MG/2ML IJ SOLN
INTRAMUSCULAR | Status: DC | PRN
  Administered 2023-10-23: 1 mg via INTRAVENOUS

## 2023-10-23 MED ORDER — IODIXANOL 320 MG/ML IV SOLN
INTRAVENOUS | Status: DC | PRN
  Administered 2023-10-23: 10 mL via INTRAVENOUS

## 2023-10-23 MED ORDER — LIDOCAINE HCL (PF) 1 % IJ SOLN
INTRAMUSCULAR | Status: DC | PRN
  Administered 2023-10-23: 4 mL via SUBCUTANEOUS

## 2023-10-23 MED ORDER — LIDOCAINE HCL (PF) 1 % IJ SOLN
INTRAMUSCULAR | Status: AC
Start: 2023-10-23 — End: ?
  Filled 2023-10-23: qty 30

## 2023-10-23 MED ORDER — MIDAZOLAM HCL 2 MG/2ML IJ SOLN
INTRAMUSCULAR | Status: AC
  Filled 2023-10-23: qty 2

## 2023-10-23 MED ORDER — FENTANYL CITRATE (PF) 100 MCG/2ML IJ SOLN
INTRAMUSCULAR | Status: DC | PRN
  Administered 2023-10-23: 25 ug via INTRAVENOUS

## 2023-10-23 SURGICAL SUPPLY — 11 items
BAG SNAP BAND KOVER 36X36 (MISCELLANEOUS) ×2 IMPLANT
BALLN MUSTANG 8.0X40 75 (BALLOONS) ×2
BALLOON MUSTANG 8.0X40 75 (BALLOONS) IMPLANT
CATH SLIP KMP 65CM 5FR (CATHETERS) IMPLANT
COVER DOME SNAP 22 D (MISCELLANEOUS) ×2 IMPLANT
GUIDEWIRE ANGLED .035X150CM (WIRE) IMPLANT
SHEATH PINNACLE R/O II 6F 4CM (SHEATH) IMPLANT
SHEATH PINNACLE R/O II 7F 4CM (SHEATH) IMPLANT
SYR MEDALLION 10ML (SYRINGE) IMPLANT
TRAY PV CATH (CUSTOM PROCEDURE TRAY) ×2 IMPLANT
WIRE MICRO SET SILHO 5FR 7 (SHEATH) IMPLANT

## 2023-10-23 NOTE — Op Note (Addendum)
Patient presents with decreased access flows and occasional cannulation difficulties of his right BCF. His last procedure was an 8 x 29 VBX stent placement in the outflow cephalic pain after the lesion was refractory to angioplasty in September 2024 by Dr. Myra Gianotti; of note, he did have residual stenosis after stent placement that did not improve with repeated angioplasty/upsizing balloon and in Dr. Estanislado Spire opinion, surgical revision would be the next option for managing this lesion. 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 Augmentation is suboptimal. No signs of impending rupture were seen over the aneurysms.   Summary:  1)      The patient had successful angioplasty (8 Mustang FE ~18 atm) of significant stenosis in the outflow cephalic vein in-stent stenosis and juxta anastomosis stenosis.  2)      The body of the cephalic vein fistula has 2 aneurysms but is otherwise patent,, patent cephalic arch and right sided central veins. 3)      This right BCF remains amenable to future percutaneous intervention.  Description of procedure: The arm was prepped and draped in the usual sterile fashion. The right upper arm brachial cephalic fistula was cannulated (69629) with a 21-gauge micropuncture needle directed in an antegrade direction. A guidewire was inserted and exchanged for a 7Fr sheath. Contrast [phone]) injection via the side port of the sheath was performed. The angiogram of the fistula (32440) showed 2 aneurysms in the body of the left BCF without intra aneurysmal stenosis, an 80% outflow cephalic vein stenosis on the outflow edge of the 8 x 29 VBX stent, 60% outflow edge of stenosis in the cephalic vein, patent cephalic arch and right sided central veins.  Reflux arteriogram showed 60% juxta anastomosis stenosis with patent arterial anastomosis and brachial artery.  The wire was advanced centrally with  minimal difficulty traversing the aneurysms. An 8 x 4 Mustang angioplasty balloon was inserted over a glide wire and positioned at the sites of outflow cephalic vein in-stent/outflow edge of stent stenosis. Venous angioplasty (10272) was carried out to 18 ATM with FULL effacement of the waist on the balloon. Repeat angiogram showed 10% residual at the site of outflow edge of stent angioplasty and 30% residual stenosis adjacent to the 8 x 29 VBX stent with no evidence of extravasation.  The flow of contrast was on the slower side indicating hemodynamic effects of the juxta anastomosis lesion.  The fistula was then cannulated in the venous limb with the needle directed retrograde using the 21-gauge micropuncture needle and switched out for a 6 French sheath over the guidewire.  A Kumpe catheter was advanced over the wire with minimal difficulty into the proximal brachial artery and an arteriogram performed that further outlined a patent proximal brachial artery, arterial anastomosis and 60% juxta anastomosis stenosis.  The Kumpe catheter was removed and replaced with the 8 x 4 Mustang angioplasty balloon that was positioned at the juxta anastomosis and venous angioplasty (53664) carried out to full effacement at 18 ATM pressure via hand syringe assembly performed.  Repeat arteriogram done through the Kumpe catheter revealed <30% stenosis and improved flows through the fistula circuit.  Hemostasis: A 3-0 ethilon purse string suture was placed at both cannulation sites on removal of the sheaths.  Sedation: 1mg  Versed, Fentanyl. Sedation time. 22 minutes  Contrast. 10 mL  Monitoring: Because of the patient's comorbid conditions and sedation during the procedure, continuous EKG monitoring  and O2 saturation monitoring was performed throughout the procedure by the RN. There were no abnormal arrhythmias encountered.  Complications: None.   Diagnoses: I87.1 Stricture of vein  N18.6 ESRD T82.858A  Stricture of access  Procedure Coding:  [phone] Cannulation and angiogram of fistula, venous angioplasty (cephalic vein outflow and inflow) R4270 Contrast  Recommendations:  1. Continue to cannulate the fistula with 15G needles.  2. Refer back for problems with flows. 3. Remove the suture next treatment.   Discharge: The patient was discharged home in stable condition. The patient was given education regarding the care of the dialysis access AVF and specific instructions in case of any problems.

## 2023-10-23 NOTE — Discharge Instructions (Signed)
Okay to discharge home anytime after 10:20 AM as long as clinically stable
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
## 2023-11-07 NOTE — ED Provider Notes (Signed)
 [facility]  ED Provider Note  Louis Peters 58 y.o. male DOB: 09-17-1966 MRN: [id number] History   Chief Complaint  Patient presents with  . Vomiting    Pt reports vomiting x7 months, unable to keep anything down. A few months ago was told it may be gastroparesis. Dialysis pt. Denies abd pain.   Patient with chronic kidney disease, diabetes, presents to the ED today with vomiting.  Patient has a history of diabetes with gastroparesis.  Patient has been vomiting up almost everything he eats for the past 7 months.  This is caused patient to go into several episodes of hypoglycemia.  Patient has been following with diabetes specialist and has an appointment scheduled with GI tomorrow.  Prior to seeing GI patient's wife wanted him to come get checked out to make sure nothing else was going on.  Patient has trialed numerous regimens from his diabetes doctor to help prevent hypoglycemic episodes and help with diabetic gastroparesis.  Patient's wife says his vomiting seems to have worsened over the past 2 months.  Patient denies any chest pain, shortness of breath, headache.       Past Medical History:  Diagnosis Date  . CKD (chronic kidney disease) stage 3, GFR 30-59 ml/min (*)    [facility]  . Diabetes type 1, uncontrolled    Dx in teens   . Hyperlipidemia   . Hypertension   . Proliferative diabetic retinopathy  03/24/2012    Past Surgical History:  Procedure Laterality Date  . Toe amputation      Social History   Substance and Sexual Activity  Alcohol Use Not Currently   Comment: social   Social History   Tobacco Use  Smoking Status Never  . Passive exposure: Yes  Smokeless Tobacco Never   E-Cigarettes  . Vaping Use Never User   . Start Date    . Cartridges/Day    . Quit Date     Social History   Substance and Sexual Activity  Drug Use No         Allergies  Allergen Reactions  . Chlorhexidine  Gluconate Other     Blisters    Discharge Medication List as of 11/07/2023  8:51 PM     CONTINUE these medications which have NOT CHANGED   Details  amLODIPine  besylate (NORVASC ) 10 mg tablet TAKE 1 TABLET(10 MG) BY MOUTH DAILY, Starting Tue 11/07/2022, Normal    ammonium lactate (LAC-HYDRIN,AMLACTIN) 12 % lotion Apply one Application topically as needed., Starting Mon 12/04/2022, Normal    aspirin  81 MG EC tablet Take one tablet (81 mg dose) by mouth daily., Starting Sat 07/24/2020, Historical Med    atorvastatin  (LIPITOR ) 80 mg tablet TAKE 1 TABLET BY MOUTH DAILY, Normal    Blood Glucose Monitoring Suppl (ONETOUCH VERIO FLEX SYSTEM) w/Device KIT 1 each by Does not apply route as needed. Use to check blood sugar as directed., Starting Wed 07/31/2018, Sample    !! Cholecalciferol  (VITAMIN D ) 50 MCG (2000 UT) tablet Take one tablet (2,000 Units dose) by mouth daily., Starting Fri 03/17/2022, Historical Med    !! Cholecalciferol  25 MCG (1000 UT) tablet Take one tablet (1,000 Units dose) by mouth daily., Historical Med    clopidogrel  bisulfate (PLAVIX ) 75 mg tablet TAKE 1 TABLET(75 MG) BY MOUTH DAILY, Normal    Continuous Glucose Receiver (DEXCOM G7 RECEIVER) DEVI 1 each by Does not apply route continuous., Starting Thu 04/26/2023, Normal    Continuous Glucose Sensor (DEXCOM G7 SENSOR) MISC  1 each by Does not apply route every 10 days. [facility]., Starting Tue 11/06/2023, Historical Med    Glucagon (BAQSIMI TWO PACK) 3 MG/DOSE POWD 1 spray by Nasal route as needed., Starting Tue 11/06/2023, Normal    HUMALOG  KWIKPEN 100 UNIT/ML SOPN injection Inject five Units into the skin after meals., Starting Tue 11/06/2023, Normal    Insulin  Degludec (TRESIBA  FLEXTOUCH) 100 UNIT/ML injection Inject five Units into the skin daily., Starting Tue 11/06/2023, Normal    Insulin  Pen Needle (BD,SURE COMFORT,NOVOFINE) 32G X 4 MM MISC one each by Does not apply route 4 (four) times daily., Starting Tue 11/06/2023, Normal     metoclopramide  HCl (REGLAN ) 5 MG tablet Multiple Dosages:Starting Tue 2/4/2025Take one tablet (5 mg dose) by mouth 30 (thirty) minutes before breakfast AND one tablet (5 mg dose) 30 (thirty) minutes before supper., Normal    mupirocin (BACTROBAN) 2 % ointment Apply topically daily., Starting Wed 08/02/2022, Normal    pantoprazole  sodium (PROTONIX ) 40 mg tablet Take one tablet (40 mg dose) by mouth 1 (one) time each day before breakfast., Starting Wed 07/12/2022, Historical Med    sertraline  (ZOLOFT ) 50 mg tablet Take one tablet (50 mg dose) by mouth daily., Starting Wed 07/12/2022, Historical Med    sevelamer  (RENVELA ) 800 MG tablet Take one tablet (800 mg dose) by mouth 3 (three) times a day., Starting Thu 04/20/2022, Historical Med    !! triamcinolone acetonide (KENALOG) 0.1% cream Apply topically 2 (two) times daily., Starting Wed 02/23/2021, Normal    !! triamcinolone acetonide (KENALOG) 0.1% cream Apply one g (1 Application dose) topically 2 (two) times daily., Starting Wed 02/23/2021, Historical Med     !! - Potential duplicate medications found. Please discuss with provider.      Primary Survey   Exposure     No visible trauma noted on back exam.      Review of Systems   Review of Systems  Gastrointestinal:  Positive for vomiting.  All other systems reviewed and are negative.   Physical Exam   ED Triage Vitals [11/07/23 1617]  BP 150/85  Heart Rate 82  Resp 17  SpO2 100 %  Temp 99.4 F (37.4 C)    Physical Exam  Nursing note and vitals reviewed. Constitutional: He appears well-developed and well-nourished.  HENT:  Head: Normocephalic and atraumatic.  Right Ear: Normal external ear.  Left Ear: Normal external ear.  Eyes: EOM are intact. Pupils are equal, round, and reactive to light.  Neck: Normal range of motion.  Cardiovascular: Normal rate and intact distal pulses.  Pulmonary/Chest: Respiratory effort normal.  Abdominal: Soft. There is no abdominal  tenderness.  Musculoskeletal: Normal range of motion. No visible trauma noted on back exam.     Cervical back: Normal range of motion.   Neurological: He is alert and oriented to person, place, and time. Moves all extremities equally. Gait normal. He has normal speech.  Skin: Skin is warm. Skin is dry.  Psychiatric: He has a normal mood and affect.    ED Course   Lab results:   CBC AND DIFFERENTIAL - Abnormal      Result Value   WBC 8.0     RBC 4.04 (*)    HGB 12.1 (*)    HCT 38.1 (*)    MCV 94.3 (*)    MCH 30.0     MCHC 31.8 (*)    Plt Ct 220     RDW SD 50.9 (*)    MPV 10.3  NRBC% 0.0     Absolute NRBC Count 0.00     NEUTROPHIL % 65.6     LYMPHOCYTE % 24.3     MONOCYTE % 8.1     Eosinophil % 1.3     BASOPHIL % 0.4     IG% 0.3     ABSOLUTE NEUTROPHIL COUNT 5.23     ABSOLUTE LYMPHOCYTE COUNT 1.93     Absolute Monocyte Count 0.64     Absolute Eosinophil Count 0.10     Absolute Basophil Count 0.03     Absolute Immature Granulocyte Count 0.02    COMPREHENSIVE METABOLIC PANEL - Abnormal   Na 137     Potassium 4.5     Cl 94 (*)    CO2 32     AGAP 11     Glucose 146 (*)    BUN 28 (*)    Creatinine 7.03 (*)    Ca 10.0     ALK PHOS 85     T Bili 0.3     Total Protein 8.4     Alb 4.8     GLOBULIN 3.6     ALBUMIN /GLOBULIN RATIO 1.3     BUN/CREAT RATIO 4.0 (*)    ALT 19     AST 22     eGFR 8 (*)    Comment: Normal GFR (glomerular filtration rate) > 60 mL/min/1.73 meters squared, < 60 may include impaired kidney function. Calculation based on the Chronic Kidney Disease Epidemiology Collaboration (CK-EPI)equation refit without adjustment for race.  URINALYSIS W/MICRO REFLEX CULTURE - SYMPTOMATIC - Abnormal   Urine Color Yellow     Urine Clarity Clear     Urine Specific Gravity 1.025     Urine pH 6.5     Urine Protein - Dipstick 100 (*)    Urine Glucose Negative     Urine Ketones Negative     Urine Bilirubin Negative     Urine Blood Negative     Urine  Nitrite Negative     Urine Urobilinogen 2 (*)    Urine Leukocyte Esterase 75 (*)    Urine Squamous Epithelial Cells 3-4 (*)    Urine WBC 3-5 (*)    Urine Bacteria None     UA Microscopic Yes Micro (*)    Narrative:    Microscopic analysis performed on uncentrifuged urine.Volume too few to spin. Does not meet criteria for reflex to Urine Culture.  LIPASE - Normal   Lipase 35    LIGHT BLUE TOP  GOLD SST    Imaging:   CT ABDOMEN PELVIS WO IV CONTRAST (STONE PROTOCOL)   Narrative:    :   INDICATION: vomiting   TECHNIQUE:  CT ABDOMEN PELVIS WO IV CONTRAST (STONE PROTOCOL) Dose reduction was utilized (automated exposure control, mA or kV adjustment based on patient size, or iterative image reconstruction).  FINDINGS:  CT ABDOMEN PELVIS   Lower Chest: Mild bilateral basilar subsegmental atelectasis.  Liver: Unremarkable  Gallbladder and Biliary Tree: Unremarkable  Pancreas: Unremarkable  Spleen: Unremarkable  Adrenal Glands: Unremarkable  Kidneys and Urinary Bladder: Unremarkable  Reproductive System: Unremarkable  Vasculature: Aortoiliac atherosclerosis without aneurysm.  Lymph Nodes: Unremarkable  Peritoneum and Retroperitoneum: No free fluid. No pneumoperitoneum.  No abscess.  Gastrointestinal Tract: Small hiatal hernia.  Mild diffuse gastric wall thickening.  Multiple mildly distended fluid-filled loops of small bowel with air-fluid levels.  No bowel obstruction.  No evidence for acute appendicitis.  Colonic diverticulosis.  Abdominal and Pelvic Walls: Unremarkable  Bones:  Multilevel spondylosis.  Old mild T12 superior endplate compression fracture.    Impression:    IMPRESSION: CT ABDOMEN PELVIS  1.  Possible developing gastroenteritis. 2.  Negative for bowel obstruction. 3.  No evidence for acute appendicitis. 4.  Colonic diverticulosis.     Electronically Signed by: Charlie Patch, MD on 11/07/2023 8:32 PM    ECG: ECG Results          ECG  12 lead (In process)  Result time 11/07/23 18:49:53    In process             Narrative:   Diagnosis Class Normal Acquisition Device D3K Ventricular Rate 77 Atrial Rate 77 P-R Interval 198 QRS Duration 78 Q-T Interval 362 QTC Calculation(Bazett) 409 Calculated P Axis 62 Calculated R Axis 42 Calculated T Axis 34  Diagnosis Normal sinus rhythm Normal ECG No previous ECGs available                                                                                 Pre-Sedation Procedures    Medical Decision Making History and physical exam were reviewed. Differential includes but is not limited to diabetic gastroparesis, gastroenteritis, pancreatitis, food intolerance.  Discussed with patient and his wife the goals of the visit today. Wife wants to ensure there is nothing else going on in his abdomen that might be causing the vomiting. Patient has an appointment with GI scheduled for in the morning for an initial consult. Discussed since this is a chronic issue GI and is endocrinologists are the best people to continue to work with him. CT Noncon ordered today to rule out other etiologies of vomiting. Diverticulosis was seen, no other acute abnormalities.  Lipase negative. Patient's creatinine at baseline.   Patient discharged stable condition with follow-up to GI in the morning.    Amount and/or Complexity of Data Reviewed Labs: ordered. Radiology: ordered. ECG/medicine tests: ordered.         Provider Communication  Discharge Medication List as of 11/07/2023  8:51 PM      Discharge Medication List as of 11/07/2023  8:51 PM      Discharge Medication List as of 11/07/2023  8:51 PM      Clinical Impression Final diagnoses:  Vomiting without nausea, unspecified vomiting type  Diverticulosis    ED Disposition     ED Disposition  Discharge   Condition  Stable   Comment  --                    Electronically signed by:    Fleeta Mardy Givens, PA-C 11/07/23 2056

## 2023-11-22 ENCOUNTER — Telehealth: Payer: Self-pay | Admitting: Surgery

## 2023-11-22 ENCOUNTER — Encounter (HOSPITAL_COMMUNITY)
Admission: RE | Disposition: A | Discharge status: Home / Self Care | Payer: Self-pay | Source: Home / Self Care | Attending: Nephrology

## 2023-11-22 ENCOUNTER — Other Ambulatory Visit: Payer: Self-pay

## 2023-11-22 ENCOUNTER — Ambulatory Visit (HOSPITAL_COMMUNITY)
Admission: RE | Admit: 2023-11-22 | Discharge: 2023-11-22 | Disposition: A | Discharge status: Home / Self Care | Payer: Medicare Other | Attending: Nephrology | Admitting: Nephrology

## 2023-11-22 DIAGNOSIS — Z992 Dependence on renal dialysis: Secondary | ICD-10-CM | POA: Insufficient documentation

## 2023-11-22 DIAGNOSIS — Z794 Long term (current) use of insulin: Secondary | ICD-10-CM | POA: Diagnosis not present

## 2023-11-22 DIAGNOSIS — Y832 Surgical operation with anastomosis, bypass or graft as the cause of abnormal reaction of the patient, or of later complication, without mention of misadventure at the time of the procedure: Secondary | ICD-10-CM | POA: Diagnosis not present

## 2023-11-22 DIAGNOSIS — I120 Hypertensive chronic kidney disease with stage 5 chronic kidney disease or end stage renal disease: Secondary | ICD-10-CM | POA: Diagnosis not present

## 2023-11-22 DIAGNOSIS — T82858A Stenosis of vascular prosthetic devices, implants and grafts, initial encounter: Secondary | ICD-10-CM | POA: Insufficient documentation

## 2023-11-22 DIAGNOSIS — N186 End stage renal disease: Secondary | ICD-10-CM | POA: Insufficient documentation

## 2023-11-22 DIAGNOSIS — E1022 Type 1 diabetes mellitus with diabetic chronic kidney disease: Secondary | ICD-10-CM | POA: Diagnosis not present

## 2023-11-22 DIAGNOSIS — E785 Hyperlipidemia, unspecified: Secondary | ICD-10-CM | POA: Insufficient documentation

## 2023-11-22 DIAGNOSIS — D631 Anemia in chronic kidney disease: Secondary | ICD-10-CM | POA: Insufficient documentation

## 2023-11-22 DIAGNOSIS — T82856A Stenosis of peripheral vascular stent, initial encounter: Secondary | ICD-10-CM | POA: Insufficient documentation

## 2023-11-22 DIAGNOSIS — Z79899 Other long term (current) drug therapy: Secondary | ICD-10-CM | POA: Diagnosis not present

## 2023-11-22 HISTORY — PX: PERIPHERAL VASCULAR BALLOON ANGIOPLASTY: CATH118281

## 2023-11-22 HISTORY — PX: A/V FISTULAGRAM: CATH118298

## 2023-11-22 SURGERY — A/V FISTULAGRAM
Anesthesia: LOCAL

## 2023-11-22 MED ORDER — HEPARIN (PORCINE) IN NACL 1000-0.9 UT/500ML-% IV SOLN
INTRAVENOUS | Status: DC | PRN
  Administered 2023-11-22: 500 mL

## 2023-11-22 MED ORDER — MIDAZOLAM HCL 2 MG/2ML IJ SOLN
INTRAMUSCULAR | Status: DC | PRN
  Administered 2023-11-22: 1 mg via INTRAVENOUS

## 2023-11-22 MED ORDER — LIDOCAINE HCL (PF) 1 % IJ SOLN
INTRAMUSCULAR | Status: DC | PRN
  Administered 2023-11-22: 2 mL via SUBCUTANEOUS

## 2023-11-22 MED ORDER — SODIUM CHLORIDE 0.9% FLUSH: 3.0000 mL | INTRAVENOUS | Status: DC | PRN

## 2023-11-22 MED ORDER — LIDOCAINE HCL (PF) 1 % IJ SOLN
INTRAMUSCULAR | Status: AC
Start: 2023-11-22 — End: ?
  Filled 2023-11-22: qty 30

## 2023-11-22 MED ORDER — ACETAMINOPHEN 325 MG PO TABS: 650.0000 mg | ORAL_TABLET | ORAL | Status: DC | PRN

## 2023-11-22 MED ORDER — FENTANYL CITRATE (PF) 100 MCG/2ML IJ SOLN
INTRAMUSCULAR | Status: DC | PRN
  Administered 2023-11-22: 50 ug via INTRAVENOUS

## 2023-11-22 MED ORDER — SODIUM CHLORIDE 0.9 % IV SOLN: INTRAVENOUS | Status: DC

## 2023-11-22 MED ORDER — LIDOCAINE HCL (PF) 1 % IJ SOLN
INTRAMUSCULAR | Status: AC
  Filled 2023-11-22: qty 30

## 2023-11-22 MED ORDER — FENTANYL CITRATE (PF) 100 MCG/2ML IJ SOLN
INTRAMUSCULAR | Status: AC
  Filled 2023-11-22: qty 2

## 2023-11-22 MED ORDER — MIDAZOLAM HCL 2 MG/2ML IJ SOLN
INTRAMUSCULAR | Status: AC
  Filled 2023-11-22: qty 2

## 2023-11-22 MED ORDER — ONDANSETRON HCL 4 MG/2ML IJ SOLN: 4.0000 mg | Freq: Four times a day (QID) | INTRAMUSCULAR | Status: DC | PRN

## 2023-11-22 MED ORDER — IODIXANOL 320 MG/ML IV SOLN
INTRAVENOUS | Status: DC | PRN
  Administered 2023-11-22: 9 mL via INTRAVENOUS

## 2023-11-22 SURGICAL SUPPLY — 10 items
BAG SNAP BAND KOVER 36X36 (MISCELLANEOUS) ×2 IMPLANT
BALLN ATHLETIS 8X40X75 (BALLOONS) ×2
BALLOON ATHLETIS 8X40X75 (BALLOONS) IMPLANT
COVER DOME SNAP 22 D (MISCELLANEOUS) ×2 IMPLANT
SHEATH PINNACLE R/O II 7F 4CM (SHEATH) IMPLANT
SYR MEDALLION 10ML (SYRINGE) IMPLANT
SYR MEDALLION 1ML (SYRINGE) IMPLANT
SYR MEDALLION 3ML (SYRINGE) IMPLANT
TRAY PV CATH (CUSTOM PROCEDURE TRAY) ×2 IMPLANT
WIRE BENTSON .035X145CM (WIRE) IMPLANT

## 2023-11-22 NOTE — Discharge Instructions (Signed)

## 2023-11-22 NOTE — Op Note (Signed)
Patient presents with decreased access flows and occasional cannulation difficulties of his right BCF. He had atherectomy by Dr. Myra Gianotti + 8 x 29 VBX stent placement in the outflow cephalic arch but the lesion could not be fully effaced.  Outflow angioplasty by Dr. Allena Katz in January flows are still only 300 cc/min.  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   Summary:  1)      The patient had "successful" angioplasty (8x4 Atlas -> 8x4 Conquest 80% effaced at 30 ATM) of significant stenosis in the outflow cephalic vein in-stent stenosis.  I am going to refer to Dr. Myra Gianotti to consider bypass of the arch to the axillary vein. 2)      The body of the cephalic vein fistula has 2 aneurysms but is otherwise patent,, patent cephalic arch (outflow 1/2 of the stents were patent); patent right sided central veins and inflow. 3)      This right BCF remains amenable to future percutaneous intervention.  Description of procedure: The arm was prepped and draped in the usual sterile fashion. The right upper arm brachial cephalic fistula was cannulated (29562) with a 18g needle directed in an antegrade direction. A guidewire was inserted and exchanged for a 7Fr sheath. Contrast [phone]) injection via the side port of the sheath was performed. The angiogram of the fistula (57846) showed 2 aneurysms in the body of the right BCF without intra aneurysmal stenosis, an 80% outflow cephalic vein stenosis close to the arch at the interface of the 8 x 29 VBX stent graft with the prior outflow Viabahn at the arch; right sided central veins and inflow were patent.  The wire was advanced centrally with minimal difficulty traversing the aneurysms. An 8 x 4 Atlas -> 8x4 Conquest angioplasty balloon was inserted over a glide wire and positioned at the sites of outflow cephalic vein in-stent/interface of 2 stents stenosis. Venous  angioplasty (96295) was carried out to ~30 ATM with 80% effacement of the waist on the balloon. Repeat angiogram showed 50% residual at the site of outflow stent angioplasty  with no evidence of extravasation.    Hemostasis: A 3-0 ethilon purse string suture was placed at both cannulation sites on removal of the sheaths.  Sedation: 1mg  Versed, 50 mcg Fentanyl. Sedation time. 33 minutes  Contrast. 9 mL  Monitoring: Because of the patient's comorbid conditions and sedation during the procedure, continuous EKG monitoring and O2 saturation monitoring was performed throughout the procedure by the RN. There were no abnormal arrhythmias encountered.  Complications: None.   Diagnoses: I87.1 Stricture of vein  N18.6 ESRD T82.858A Stricture of access  Procedure Coding:  [phone] Cannulation and angiogram of fistula, venous angioplasty (cephalic vein outflow) K4401 Contrast  Recommendations:  1. Continue to cannulate the fistula with 15G needles.  2. Refer back for problems with flows. 3. Remove the suture next treatment. 4. Will refer to  Dr. Myra Gianotti to do a bypass of the arch to the axillary vein.  Discharge: The patient was discharged home in stable condition. The patient was given education regarding the care of the dialysis access AVF and specific instructions in case of any problems.

## 2023-11-22 NOTE — Telephone Encounter (Signed)
Reviewed images from Dr. Truman Hayward fistulogram.  He will be scheduled for revision of his right arm fistula with jump graft to the axillary vein to exclude the stented portion of his fistula   Durene Cal
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
## 2023-11-23 ENCOUNTER — Telehealth: Payer: Self-pay

## 2023-11-23 ENCOUNTER — Encounter (HOSPITAL_COMMUNITY): Payer: Self-pay | Admitting: Nephrology

## 2023-11-23 NOTE — Telephone Encounter (Signed)
 Attempted to call for surgery scheduling. LVM

## 2023-11-26 ENCOUNTER — Other Ambulatory Visit: Payer: Self-pay

## 2023-11-26 DIAGNOSIS — N186 End stage renal disease: Secondary | ICD-10-CM

## 2023-11-28 ENCOUNTER — Other Ambulatory Visit: Payer: Self-pay

## 2023-11-28 ENCOUNTER — Encounter (HOSPITAL_COMMUNITY): Payer: Self-pay | Admitting: Surgery

## 2023-11-28 NOTE — Progress Notes (Signed)
 PCP - Dr Tracey Harries Cardiologist - none Endocrinology - Dr Crista Curb  Chest x-ray - n/a EKG - DOS Stress Test - n/a ECHO - 02/19/22 Cardiac Cath - n/a  ICD Pacemaker/Loop - n/a  Sleep Study -  n/a  Diabetes Type 1 Dexcom 7 System - located on left upper arm. Fasting Blood Sugar - 100-300s Checks Blood Sugar _several_ times a day Last dose of GLP1 agonist-  none   THE NIGHT BEFORE SURGERY, take 5 units Tresiba Insulin.        THE MORNING OF SURGERY, take 80% Humalog Insulin dose if needed  If your blood sugar is less than 70 mg/dL, you will need to treat for low blood sugar: Treat a low blood sugar (less than 70 mg/dL) with  cup of clear juice (cranberry or apple), 4 glucose tablets, OR glucose gel. Recheck blood sugar in 15 minutes after treatment (to make sure it is greater than 70 mg/dL). If your blood sugar is not greater than 70 mg/dL on recheck, call [phone] for further instructions.  Aspirin and Blood Thinner Instructions:  n/a  NPO   Anesthesia review: Yes  STOP now taking any Aspirin (unless otherwise instructed by your surgeon), Aleve, Naproxen, Ibuprofen, Motrin, Advil, Goody's, BC's, all herbal medications, fish oil, and all vitamins.   Coronavirus Screening Do you have any of the following symptoms:  Cough yes/no: No Fever (>100.44F)  yes/no: No Runny nose yes/no: No Sore throat yes/no: No Difficulty breathing/shortness of breath  yes/no: No  Have you traveled in the last 14 days and where? yes/no: No  Patient verbalized understanding of instructions that were given via phone.

## 2023-11-29 ENCOUNTER — Encounter (HOSPITAL_COMMUNITY): Payer: Self-pay | Admitting: Surgery
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
## 2023-11-29 NOTE — Anesthesia Preprocedure Evaluation (Signed)
 Anesthesia Evaluation  Patient identified by MRN, date of birth, ID band Patient awake    Reviewed: Allergy & Precautions, NPO status , Patient's Chart, lab work & pertinent test results  Airway Mallampati: II  TM Distance: >3 FB Neck ROM: Full    Dental no notable dental hx. (+) Edentulous Upper, Edentulous Lower   Pulmonary neg pulmonary ROS   Pulmonary exam normal        Cardiovascular hypertension, Pt. on medications + Peripheral Vascular Disease   Rhythm:Regular Rate:Normal  Zio Monitor 02/21/22 - 02/28/22: - Patient had a min HR of 49 bpm, max HR of 164 bpm, and avg HR of 76 bpm. Predominant underlying rhythm was Sinus Rhythm. 1 run of Supraventricular Tachycardia occurred lasting 7 beats with a max rate of 164 bpm (avg 150 bpm). Isolated SVEs were rare  (<1.0%), SVE Couplets were rare (<1.0%), and SVE Triplets were rare (<1.0%). Isolated VEs were rare (<1.0%), and no VE Couplets or VE Triplets were present. Ventricular Bigeminy was present. - No symptoms reported.  - Conclusion: Rare paroxysmal supraventricular tachycardia.   Echo 02/19/22: IMPRESSIONS   1. Left ventricular ejection fraction, by estimation, is 60 to 65%. The  left ventricle has normal function. The left ventricle has no regional  wall motion abnormalities. There is moderate concentric left ventricular  hypertrophy. Left ventricular  diastolic parameters are consistent with Grade I diastolic dysfunction  (impaired relaxation).   2. Right ventricular systolic function is low normal. The right  ventricular size is mildly enlarged. There is moderately elevated  pulmonary artery systolic pressure. The estimated right ventricular systolic pressure is 45.2 mmHg.   3. Left atrial size was moderately dilated.   4. Right atrial size was moderately dilated.   5. A small pericardial effusion is present. The pericardial effusion is  circumferential.   6. The mitral  valve is normal in structure. No evidence of mitral valve  regurgitation.   7. The aortic valve is tricuspid. Aortic valve regurgitation is not  visualized. No aortic stenosis is present.   8. The inferior vena cava is dilated in size with >50% respiratory  variability, suggesting right atrial pressure of 8 mmHg.       Neuro/Psych CVA  negative psych ROS   GI/Hepatic negative GI ROS, Neg liver ROS,,,  Endo/Other  diabetes, Type 1, Insulin Dependent    Renal/GU CRF and DialysisRenal disease  negative genitourinary   Musculoskeletal negative musculoskeletal ROS (+)    Abdominal Normal abdominal exam  (+)   Peds  Hematology  (+) Blood dyscrasia, anemia   Anesthesia Other Findings   Reproductive/Obstetrics                             Anesthesia Physical Anesthesia Plan  ASA: 3  Anesthesia Plan: General   Post-op Pain Management:    Induction: Intravenous  PONV Risk Score and Plan: 2 and Ondansetron, Dexamethasone, Midazolam and Treatment may vary due to age or medical condition  Airway Management Planned: Mask and LMA  Additional Equipment: None  Intra-op Plan:   Post-operative Plan: Extubation in OR  Informed Consent: I have reviewed the patients History and Physical, chart, labs and discussed the procedure including the risks, benefits and alternatives for the proposed anesthesia with the patient or authorized representative who has indicated his/her understanding and acceptance.     Dental advisory given  Plan Discussed with: CRNA  Anesthesia Plan Comments: (PAT note written 11/29/2023 by Revonda Standard  Blayne Frankie, PA-C. ESRD, DM1 with gastroparesis.  )        Anesthesia Quick Evaluation

## 2023-11-30 ENCOUNTER — Ambulatory Visit (HOSPITAL_BASED_OUTPATIENT_CLINIC_OR_DEPARTMENT_OTHER): Payer: Medicare Other | Admitting: Vascular Surgery

## 2023-11-30 ENCOUNTER — Encounter (HOSPITAL_COMMUNITY)
Admission: RE | Disposition: A | Discharge status: Home / Self Care | Payer: Self-pay | Source: Home / Self Care | Attending: Surgery

## 2023-11-30 ENCOUNTER — Other Ambulatory Visit: Payer: Self-pay

## 2023-11-30 ENCOUNTER — Ambulatory Visit (HOSPITAL_COMMUNITY)
Admission: RE | Admit: 2023-11-30 | Discharge: 2023-11-30 | Disposition: A | Discharge status: Home / Self Care | Payer: Medicare Other | Attending: Surgery | Admitting: Surgery

## 2023-11-30 ENCOUNTER — Encounter (HOSPITAL_COMMUNITY): Payer: Self-pay | Admitting: Surgery

## 2023-11-30 ENCOUNTER — Ambulatory Visit (HOSPITAL_COMMUNITY): Payer: Medicare Other | Admitting: Vascular Surgery

## 2023-11-30 DIAGNOSIS — E1022 Type 1 diabetes mellitus with diabetic chronic kidney disease: Secondary | ICD-10-CM | POA: Diagnosis not present

## 2023-11-30 DIAGNOSIS — Z8673 Personal history of transient ischemic attack (TIA), and cerebral infarction without residual deficits: Secondary | ICD-10-CM | POA: Diagnosis not present

## 2023-11-30 DIAGNOSIS — N186 End stage renal disease: Secondary | ICD-10-CM

## 2023-11-30 DIAGNOSIS — Y832 Surgical operation with anastomosis, bypass or graft as the cause of abnormal reaction of the patient, or of later complication, without mention of misadventure at the time of the procedure: Secondary | ICD-10-CM | POA: Insufficient documentation

## 2023-11-30 DIAGNOSIS — I679 Cerebrovascular disease, unspecified: Secondary | ICD-10-CM | POA: Diagnosis not present

## 2023-11-30 DIAGNOSIS — Z794 Long term (current) use of insulin: Secondary | ICD-10-CM | POA: Insufficient documentation

## 2023-11-30 DIAGNOSIS — I120 Hypertensive chronic kidney disease with stage 5 chronic kidney disease or end stage renal disease: Secondary | ICD-10-CM | POA: Diagnosis not present

## 2023-11-30 DIAGNOSIS — T82858A Stenosis of vascular prosthetic devices, implants and grafts, initial encounter: Secondary | ICD-10-CM | POA: Insufficient documentation

## 2023-11-30 DIAGNOSIS — Z992 Dependence on renal dialysis: Secondary | ICD-10-CM | POA: Diagnosis not present

## 2023-11-30 DIAGNOSIS — I739 Peripheral vascular disease, unspecified: Secondary | ICD-10-CM | POA: Diagnosis not present

## 2023-11-30 DIAGNOSIS — N185 Chronic kidney disease, stage 5: Secondary | ICD-10-CM

## 2023-11-30 HISTORY — DX: Gastroparesis: K31.84

## 2023-11-30 HISTORY — PX: REVISON OF ARTERIOVENOUS FISTULA: SHX6074

## 2023-11-30 LAB — POCT I-STAT, CHEM 8
BUN: 27 mg/dL — ABNORMAL HIGH (ref 6–20)
Calcium, Ion: 0.84 mmol/L — CL (ref 1.15–1.40)
Chloride: 101 mmol/L (ref 98–111)
Creatinine, Ser: 8.3 mg/dL — ABNORMAL HIGH (ref 0.61–1.24)
Glucose, Bld: 227 mg/dL — ABNORMAL HIGH (ref 70–99)
HCT: 34 % — ABNORMAL LOW (ref 39.0–52.0)
Hemoglobin: 11.6 g/dL — ABNORMAL LOW (ref 13.0–17.0)
Potassium: 4.3 mmol/L (ref 3.5–5.1)
Sodium: 134 mmol/L — ABNORMAL LOW (ref 135–145)
TCO2: 27 mmol/L (ref 22–32)

## 2023-11-30 LAB — GLUCOSE, CAPILLARY
Glucose-Capillary: 238 mg/dL — ABNORMAL HIGH (ref 70–99)
Glucose-Capillary: 123 mg/dL — ABNORMAL HIGH (ref 70–99)
Glucose-Capillary: 95 mg/dL (ref 70–99)

## 2023-11-30 SURGERY — REVISON OF ARTERIOVENOUS FISTULA
Anesthesia: General | Site: Arm Upper | Laterality: Right

## 2023-11-30 MED ORDER — EPHEDRINE 5 MG/ML INJ
INTRAVENOUS | Status: AC
  Filled 2023-11-30: qty 5

## 2023-11-30 MED ORDER — CHLORHEXIDINE GLUCONATE 0.12 % MT SOLN
OROMUCOSAL | Status: AC
  Administered 2023-11-30: 15 mL via OROMUCOSAL
  Filled 2023-11-30: qty 15

## 2023-11-30 MED ORDER — DEXAMETHASONE SODIUM PHOSPHATE 10 MG/ML IJ SOLN
INTRAMUSCULAR | Status: DC | PRN
  Administered 2023-11-30: 4 mg via INTRAVENOUS

## 2023-11-30 MED ORDER — LIDOCAINE 2% (20 MG/ML) 5 ML SYRINGE
INTRAMUSCULAR | Status: DC | PRN
  Administered 2023-11-30: 60 mg via INTRAVENOUS

## 2023-11-30 MED ORDER — 0.9 % SODIUM CHLORIDE (POUR BTL) OPTIME
TOPICAL | Status: DC | PRN
  Administered 2023-11-30: 1000 mL

## 2023-11-30 MED ORDER — HEPARIN 6000 UNIT IRRIGATION SOLUTION
Status: DC | PRN
  Administered 2023-11-30: 1

## 2023-11-30 MED ORDER — CEFAZOLIN SODIUM-DEXTROSE 2-4 GM/100ML-% IV SOLN
INTRAVENOUS | Status: AC
  Filled 2023-11-30: qty 100

## 2023-11-30 MED ORDER — CHLORHEXIDINE GLUCONATE 4 % EX SOLN: 60.0000 mL | Freq: Once | CUTANEOUS | Status: DC

## 2023-11-30 MED ORDER — HEPARIN SODIUM (PORCINE) 1000 UNIT/ML IJ SOLN
INTRAMUSCULAR | Status: AC
  Filled 2023-11-30: qty 10

## 2023-11-30 MED ORDER — SODIUM CHLORIDE 0.9% FLUSH: 3.0000 mL | INTRAVENOUS | Status: DC | PRN

## 2023-11-30 MED ORDER — MIDAZOLAM HCL 2 MG/2ML IJ SOLN
INTRAMUSCULAR | Status: DC | PRN
  Administered 2023-11-30: 2 mg via INTRAVENOUS

## 2023-11-30 MED ORDER — OXYCODONE-ACETAMINOPHEN 5-325 MG PO TABS: 1.0000 | ORAL_TABLET | Freq: Four times a day (QID) | ORAL | 0 refills | Status: AC | PRN

## 2023-11-30 MED ORDER — MIDAZOLAM HCL 2 MG/2ML IJ SOLN
INTRAMUSCULAR | Status: AC
  Filled 2023-11-30: qty 2

## 2023-11-30 MED ORDER — ORAL CARE MOUTH RINSE: 15.0000 mL | Freq: Once | OROMUCOSAL | Status: AC

## 2023-11-30 MED ORDER — SURGIFLO WITH THROMBIN (HEMOSTATIC MATRIX KIT) OPTIME
TOPICAL | Status: DC | PRN
  Administered 2023-11-30: 1 via TOPICAL

## 2023-11-30 MED ORDER — EPHEDRINE SULFATE-NACL 50-0.9 MG/10ML-% IV SOSY
PREFILLED_SYRINGE | INTRAVENOUS | Status: DC | PRN
  Administered 2023-11-30: 7.5 mg via INTRAVENOUS

## 2023-11-30 MED ORDER — LIDOCAINE 2% (20 MG/ML) 5 ML SYRINGE
INTRAMUSCULAR | Status: AC
Start: 2023-11-30 — End: ?
  Filled 2023-11-30: qty 5

## 2023-11-30 MED ORDER — ONDANSETRON HCL 4 MG/2ML IJ SOLN
INTRAMUSCULAR | Status: DC | PRN
Start: 2023-11-30 — End: 2023-11-30
  Administered 2023-11-30: 4 mg via INTRAVENOUS

## 2023-11-30 MED ORDER — PROPOFOL 10 MG/ML IV BOLUS
INTRAVENOUS | Status: DC | PRN
  Administered 2023-11-30: 150 mg via INTRAVENOUS

## 2023-11-30 MED ORDER — SODIUM CHLORIDE 0.9% FLUSH: 3.0000 mL | Freq: Two times a day (BID) | INTRAVENOUS | Status: DC

## 2023-11-30 MED ORDER — PROPOFOL 10 MG/ML IV BOLUS
INTRAVENOUS | Status: AC
  Filled 2023-11-30: qty 20

## 2023-11-30 MED ORDER — DEXAMETHASONE SODIUM PHOSPHATE 10 MG/ML IJ SOLN
INTRAMUSCULAR | Status: AC
  Filled 2023-11-30: qty 1

## 2023-11-30 MED ORDER — CHLORHEXIDINE GLUCONATE 0.12 % MT SOLN: 15.0000 mL | Freq: Once | OROMUCOSAL | Status: AC

## 2023-11-30 MED ORDER — FENTANYL CITRATE (PF) 250 MCG/5ML IJ SOLN
INTRAMUSCULAR | Status: AC
  Filled 2023-11-30: qty 5

## 2023-11-30 MED ORDER — FENTANYL CITRATE (PF) 250 MCG/5ML IJ SOLN
INTRAMUSCULAR | Status: DC | PRN
  Administered 2023-11-30: 50 ug via INTRAVENOUS
  Administered 2023-11-30 (×2): 25 ug via INTRAVENOUS
  Administered 2023-11-30: 50 ug via INTRAVENOUS

## 2023-11-30 MED ORDER — SODIUM CHLORIDE 0.9 % IV SOLN: INTRAVENOUS | Status: DC

## 2023-11-30 MED ORDER — SUGAMMADEX SODIUM 200 MG/2ML IV SOLN
INTRAVENOUS | Status: AC
  Filled 2023-11-30: qty 2

## 2023-11-30 MED ORDER — ACETAMINOPHEN 10 MG/ML IV SOLN: 1000.0000 mg | Freq: Once | INTRAVENOUS | Status: DC | PRN

## 2023-11-30 MED ORDER — ONDANSETRON HCL 4 MG/2ML IJ SOLN
INTRAMUSCULAR | Status: AC
  Filled 2023-11-30: qty 2

## 2023-11-30 MED ORDER — CEFAZOLIN SODIUM-DEXTROSE 2-4 GM/100ML-% IV SOLN
2.0000 g | INTRAVENOUS | Status: AC
  Administered 2023-11-30: 2 g via INTRAVENOUS

## 2023-11-30 MED ORDER — FENTANYL CITRATE (PF) 100 MCG/2ML IJ SOLN: 25.0000 ug | INTRAMUSCULAR | Status: DC | PRN

## 2023-11-30 MED ORDER — HEPARIN 6000 UNIT IRRIGATION SOLUTION
Status: AC
  Filled 2023-11-30: qty 500

## 2023-11-30 MED ORDER — INSULIN ASPART 100 UNIT/ML IJ SOLN
5.0000 [IU] | Freq: Once | INTRAMUSCULAR | Status: AC
  Administered 2023-11-30: 5 [IU] via SUBCUTANEOUS
  Filled 2023-11-30: qty 1

## 2023-11-30 MED ORDER — PHENYLEPHRINE HCL-NACL 20-0.9 MG/250ML-% IV SOLN
INTRAVENOUS | Status: DC | PRN
  Administered 2023-11-30: 15 ug/min via INTRAVENOUS

## 2023-11-30 MED ORDER — INSULIN ASPART 100 UNIT/ML IJ SOLN: 0.0000 [IU] | INTRAMUSCULAR | Status: DC | PRN

## 2023-11-30 SURGICAL SUPPLY — 30 items
ARMBAND PINK RESTRICT EXTREMIT (MISCELLANEOUS) ×1 IMPLANT
BAG COUNTER SPONGE SURGICOUNT (BAG) ×1 IMPLANT
CANISTER SUCT 3000ML PPV (MISCELLANEOUS) ×1 IMPLANT
CLIP TI MEDIUM 6 (CLIP) ×1 IMPLANT
CLIP TI WIDE RED SMALL 6 (CLIP) ×1 IMPLANT
COVER PROBE W GEL 5X96 (DRAPES) IMPLANT
DERMABOND ADVANCED .7 DNX12 (GAUZE/BANDAGES/DRESSINGS) ×1 IMPLANT
ELECT REM PT RETURN 9FT ADLT (ELECTROSURGICAL) ×1 IMPLANT
ELECTRODE REM PT RTRN 9FT ADLT (ELECTROSURGICAL) ×1 IMPLANT
GLOVE SURG SS PI 7.5 STRL IVOR (GLOVE) ×3 IMPLANT
GOWN STRL REUS W/ TWL LRG LVL3 (GOWN DISPOSABLE) ×2 IMPLANT
GOWN STRL REUS W/ TWL XL LVL3 (GOWN DISPOSABLE) ×1 IMPLANT
HEMOSTAT SNOW SURGICEL 2X4 (HEMOSTASIS) IMPLANT
KIT BASIN OR (CUSTOM PROCEDURE TRAY) ×1 IMPLANT
KIT TURNOVER KIT B (KITS) ×1 IMPLANT
NS IRRIG 1000ML POUR BTL (IV SOLUTION) ×1 IMPLANT
PACK CV ACCESS (CUSTOM PROCEDURE TRAY) ×1 IMPLANT
PAD ARMBOARD 7.5X6 YLW CONV (MISCELLANEOUS) ×2 IMPLANT
SLING ARM FOAM STRAP LRG (SOFTGOODS) IMPLANT
SLING ARM FOAM STRAP MED (SOFTGOODS) IMPLANT
SURGIFLO W/THROMBIN 8M KIT (HEMOSTASIS) IMPLANT
SUT PROLENE 5 0 C 1 24 (SUTURE) IMPLANT
SUT PROLENE 6 0 BV (SUTURE) IMPLANT
SUT PROLENE 6 0 CC (SUTURE) ×1 IMPLANT
SUT VIC AB 3-0 SH 27X BRD (SUTURE) ×1 IMPLANT
SUT VIC AB 4-0 PS2 18 (SUTURE) IMPLANT
SUT VICRYL 4-0 PS2 18IN ABS (SUTURE) IMPLANT
TOWEL GREEN STERILE (TOWEL DISPOSABLE) ×1 IMPLANT
UNDERPAD 30X36 HEAVY ABSORB (UNDERPADS AND DIAPERS) ×1 IMPLANT
WATER STERILE IRR 1000ML POUR (IV SOLUTION) ×1 IMPLANT

## 2023-11-30 NOTE — Anesthesia Procedure Notes (Signed)
 Procedure Name: LMA Insertion Date/Time: 11/30/2023 12:37 PM  Performed by: Sudie Grumbling, CRNAPre-anesthesia Checklist: Patient identified, Emergency Drugs available, Suction available, Patient being monitored and Timeout performed Patient Re-evaluated:Patient Re-evaluated prior to induction Oxygen Delivery Method: Circle system utilized Preoxygenation: Pre-oxygenation with 100% oxygen Induction Type: IV induction LMA: LMA inserted LMA Size: 4.0 Number of attempts: 1 Tube secured with: Tape Dental Injury: Teeth and Oropharynx as per pre-operative assessment

## 2023-11-30 NOTE — OR Nursing (Signed)
 Revision of the Right Arteriovenous Fistula Graft done today by Dr. Myra Gianotti.

## 2023-11-30 NOTE — Op Note (Signed)
    Patient name: Louis Peters MRN: [id number] DOB: 1966/03/29 Sex: male  11/30/2023 Pre-operative Diagnosis: ESRD Post-operative diagnosis:  Same Surgeon:  Durene Cal Assistants:  Adonis Housekeeper, PA Procedure:   Revision of right brachiocephalic fistula (cephalic vein turndown) Anesthesia:  General Blood Loss:  minimal Specimens:  none  Indications: This is a 58 year old gentleman with end-stage renal disease who has undergone multiple fistulogram's to address a stenosis around stents within his cephalic vein junction.  He has residual stenosis and so the decision was made to proceed with surgical revision.  Procedure:  The patient was identified in the holding area and taken to [facility] OR ROOM 12  The patient was then placed supine on the table. general anesthesia was administered.  The patient was prepped and draped in the usual sterile fashion.  A time out was called and antibiotics were administered.  A PA was necessary to expedite the procedure and assist with technical details.  She helped with exposure by providing suction and retraction.  She helped with the anastomosis by following the suture.  A curvilinear incision was made around the axillary hairline.  I first dissected out the cephalic vein which is a 6 mm vein.  I mobilized it proximally and distally as far as I could go.  I did not encounter the stent.  Through the same incision I exposed the brachial vein which was an 8 mm vein.  I felt I had exposed enough of the cephalic vein to perform a primary anastomosis and not place an interposition graft.  I then ligated the cephalic vein as proximal as possible after marking it for orientation.  I then occluded the brachial vein with vascular clamps and made a venotomy with an 11 blade which was opened further with Potts scissors.  The cephalic vein was then spatulated to fit the size of the venotomy and a running anastomosis was performed with 6-0 Prolene.  Prior to completion the appropriate  flushing maneuvers were performed and the anastomosis was completed.  There was an excellent thrill within the fistula.  I inspected the course of the vein to make sure that there was no tension and that there were no kinks.  The wound was then copiously irrigated.  Hemostasis was achieved.  The subcutaneous tissue was closed with 3-0 Vicryl followed by subcuticular closure and Dermabond.  There were no immediate complications.   Disposition: To PACU stable.   Juleen China, M.D., [facility] [place] Office: [phone] Pager:  [phone]

## 2023-11-30 NOTE — Discharge Instructions (Signed)
 [facility]  Discharge Instructions  AV Fistula or Graft Surgery for Dialysis Access  Please refer to the following instructions for your post-procedure care. Your surgeon or physician assistant will discuss any changes with you.  Activity  You may drive the day following your surgery, if you are comfortable and no longer taking prescription pain medication. Resume full activity as the soreness in your incision resolves.  Bathing/Showering  You may shower after you go home. Keep your incision dry for 48 hours. Do not soak in a bathtub, hot tub, or swim until the incision heals completely. You may not shower if you have a hemodialysis catheter.  Incision Care  Clean your incision with mild soap and water after 48 hours. Pat the area dry with a clean towel. You do not need a bandage unless otherwise instructed. Do not apply any ointments or creams to your incision. You may have skin glue on your incision. Do not peel it off. It will come off on its own in about one week. Your arm may swell a bit after surgery. To reduce swelling use pillows to elevate your arm so it is above your heart. Your doctor will tell you if you need to lightly wrap your arm with an ACE bandage.  Diet  Resume your normal diet. There are not special food restrictions following this procedure. In order to heal from your surgery, it is CRITICAL to get adequate nutrition. Your body requires vitamins, minerals, and protein. Vegetables are the best source of vitamins and minerals. Vegetables also provide the perfect balance of protein. Processed food has little nutritional value, so try to avoid this.  Medications  Resume taking all of your medications. If your incision is causing pain, you may take over-the counter pain relievers such as acetaminophen (Tylenol). If you were prescribed a stronger pain medication, please be aware these medications can cause nausea and constipation. Prevent  nausea by taking the medication with a snack or meal. Avoid constipation by drinking plenty of fluids and eating foods with high amount of fiber, such as fruits, vegetables, and grains.  Do not take Tylenol if you are taking prescription pain medications.  Follow up Your surgeon may want to see you in the office following your access surgery. If so, this will be arranged at the time of your surgery.  Please call us immediately for any of the following conditions:  Increased pain, redness, drainage (pus) from your incision site Fever of 101 degrees or higher Severe or worsening pain at your incision site Hand pain or numbness.  Reduce your risk of vascular disease:  Stop smoking. If you would like help, call [facility] at 1-800-QUIT-NOW (873-702-6210) or [place] at 667-489-2899  Manage your cholesterol Maintain a desired weight Control your diabetes Keep your blood pressure down  Dialysis  It will take several weeks to several months for your new dialysis access to be ready for use. Your surgeon will determine when it is okay to use it. Your nephrologist will continue to direct your dialysis. You can continue to use your Permcath until your new access is ready for use.   11/30/2023 Louis Peters [id number] 03/28/1966  Surgeon(s): Nada Libman, MD  Procedure(s): REVISON OF RIGHT ARM ARTERIOVENOUS FISTULA   May stick graft immediately  X May stick fistula in designated area only: mid - distal upper arm  X Do not stick over incision for 6 weeks    If you have any questions, please  call the office at [phone].

## 2023-11-30 NOTE — Transfer of Care (Signed)
 Immediate Anesthesia Transfer of Care Note  Patient: Louis Peters  Procedure(s) Performed: REVISON OF RIGHT ARM ARTERIOVENOUS FISTULA (Right: Arm Upper)  Patient Location: PACU  Anesthesia Type:General  Level of Consciousness: drowsy  Airway & Oxygen Therapy: Patient Spontanous Breathing and Patient connected to face mask oxygen  Post-op Assessment: Report given to RN and Post -op Vital signs reviewed and stable  Post vital signs: Reviewed and stable  Last Vitals:  Vitals Value Taken Time  BP 130/61 11/30/23 1409  Temp 36.4 C 11/30/23 1409  Pulse 71 11/30/23 1415  Resp 11 11/30/23 1415  SpO2 100 % 11/30/23 1415  Vitals shown include unfiled device data.  Last Pain:  Vitals:   11/30/23 1409  TempSrc:   PainSc: Asleep         Complications: No notable events documented.

## 2023-11-30 NOTE — Anesthesia Postprocedure Evaluation (Signed)
 Anesthesia Post Note  Patient: Louis Peters  Procedure(s) Performed: REVISON OF RIGHT ARM ARTERIOVENOUS FISTULA (Right: Arm Upper)     Patient location during evaluation: PACU Anesthesia Type: General Level of consciousness: awake and alert Pain management: pain level controlled Vital Signs Assessment: post-procedure vital signs reviewed and stable Respiratory status: spontaneous breathing, nonlabored ventilation, respiratory function stable and patient connected to nasal cannula oxygen Cardiovascular status: blood pressure returned to baseline and stable Postop Assessment: no apparent nausea or vomiting Anesthetic complications: no   No notable events documented.  Last Vitals:  Vitals:   11/30/23 1430 11/30/23 1441  BP: 129/66 126/69  Pulse: 71 72  Resp: 11 14  Temp:  36.4 C  SpO2: 100% 100%    Last Pain:  Vitals:   11/30/23 1441  TempSrc:   PainSc: 0-No pain                 Earl Lites P Dolores Mcgovern

## 2023-11-30 NOTE — OR Nursing (Signed)
 Confirmed with Dr. Myra Gianotti that we are operating on the right arm even though note says "left". Pt also confimed right arm is the correct arm and was marked and consented for right arm.

## 2023-11-30 NOTE — H&P (Signed)
   Patient name: Louis Peters MRN: [id number] DOB: 03-12-1966 Sex: male   HISTORY OF PRESENT ILLNESS:   Louis Peters is a 58 y.o. 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  CURRENT MEDICATIONS:    Current Facility-Administered Medications  Medication Dose Route Frequency Provider Last Rate Last Admin   0.9 %  sodium chloride infusion   Intravenous Continuous Stoltzfus, Gregory P, DO       ceFAZolin (ANCEF) 2-4 GM/100ML-% IVPB            ceFAZolin (ANCEF) IVPB 2g/100 mL premix  2 g Intravenous 30 min Pre-Op Nada Libman, MD       chlorhexidine (HIBICLENS) 4 % liquid 4 Application  60 mL Topical Once Nada Libman, MD       And   Melene Muller ON 12/01/2023] chlorhexidine (HIBICLENS) 4 % liquid 4 Application  60 mL Topical Once Nada Libman, MD       sodium chloride flush (NS) 0.9 % injection 3-10 mL  3-10 mL Intravenous Q12H Nada Libman, MD       sodium chloride flush (NS) 0.9 % injection 3-10 mL  3-10 mL Intravenous PRN Nada Libman, MD        REVIEW OF SYSTEMS:   [X]  denotes positive finding, [ ]  denotes negative finding Cardiac  Comments:  Chest pain or chest pressure:    Shortness of breath upon exertion:    Short of breath when lying flat:    Irregular heart rhythm:    Constitutional    Fever or chills:      PHYSICAL EXAM:   Vitals:   11/28/23 1837 11/30/23 0910  BP:  (!) 150/79  Pulse:  89  Resp:  18  Temp:  97.9 F (36.6 C)  TempSrc:  Oral  SpO2:  99%  Weight: 79.4 kg 80.7 kg  Height:  5\' 10"  (1.778 m)    GENERAL: The patient is a well-nourished male, in no acute distress. The vital signs are documented above. CARDIOVASCULAR: There is a regular rate and rhythm. PULMONARY: Non-labored respirations   STUDIES:     MEDICAL ISSUES:   Plan for revision of left arm fistula  Charlena Cross, MD, FACS [facility] of [facility] [phone] Pager [phone]

## 2023-12-01 ENCOUNTER — Encounter (HOSPITAL_COMMUNITY): Payer: Self-pay | Admitting: Surgery

## 2024-03-17 ENCOUNTER — Other Ambulatory Visit: Payer: Self-pay

## 2024-03-17 ENCOUNTER — Encounter (HOSPITAL_COMMUNITY)
Admission: RE | Disposition: A | Discharge status: Home / Self Care | Payer: Self-pay | Source: Ambulatory Visit | Attending: Vascular Surgery

## 2024-03-17 ENCOUNTER — Ambulatory Visit (HOSPITAL_COMMUNITY)
Admission: RE | Admit: 2024-03-17 | Discharge: 2024-03-17 | Disposition: A | Discharge status: Home / Self Care | Source: Ambulatory Visit | Attending: Vascular Surgery | Admitting: Vascular Surgery

## 2024-03-17 DIAGNOSIS — Z992 Dependence on renal dialysis: Secondary | ICD-10-CM | POA: Diagnosis not present

## 2024-03-17 DIAGNOSIS — T82858A Stenosis of vascular prosthetic devices, implants and grafts, initial encounter: Secondary | ICD-10-CM | POA: Diagnosis not present

## 2024-03-17 DIAGNOSIS — E1022 Type 1 diabetes mellitus with diabetic chronic kidney disease: Secondary | ICD-10-CM | POA: Diagnosis not present

## 2024-03-17 DIAGNOSIS — N186 End stage renal disease: Secondary | ICD-10-CM | POA: Diagnosis not present

## 2024-03-17 DIAGNOSIS — I120 Hypertensive chronic kidney disease with stage 5 chronic kidney disease or end stage renal disease: Secondary | ICD-10-CM | POA: Diagnosis not present

## 2024-03-17 DIAGNOSIS — T82868A Thrombosis of vascular prosthetic devices, implants and grafts, initial encounter: Secondary | ICD-10-CM | POA: Diagnosis present

## 2024-03-17 DIAGNOSIS — Y832 Surgical operation with anastomosis, bypass or graft as the cause of abnormal reaction of the patient, or of later complication, without mention of misadventure at the time of the procedure: Secondary | ICD-10-CM | POA: Insufficient documentation

## 2024-03-17 HISTORY — PX: A/V SHUNT INTERVENTION: CATH118220

## 2024-03-17 HISTORY — PX: VENOUS STENT: CATH118377

## 2024-03-17 HISTORY — PX: VENOUS ANGIOPLASTY: CATH118376

## 2024-03-17 HISTORY — PX: UPPER EXTREMITY INTERVENTION: CATH118271

## 2024-03-17 SURGERY — A/V SHUNT INTERVENTION
Anesthesia: LOCAL | Site: Arm Upper | Laterality: Right

## 2024-03-17 MED ORDER — HEPARIN (PORCINE) IN NACL 1000-0.9 UT/500ML-% IV SOLN
INTRAVENOUS | Status: DC | PRN
Start: 2024-03-17 — End: 2024-03-17
  Administered 2024-03-17: 500 mL

## 2024-03-17 MED ORDER — IODIXANOL 320 MG/ML IV SOLN
INTRAVENOUS | Status: DC | PRN
  Administered 2024-03-17: 35 mL via INTRAVENOUS

## 2024-03-17 MED ORDER — ASPIRIN 81 MG PO TBEC: 81.0000 mg | DELAYED_RELEASE_TABLET | Freq: Every day | ORAL | 12 refills | Status: AC

## 2024-03-17 MED ORDER — HEPARIN SODIUM (PORCINE) 1000 UNIT/ML IJ SOLN
INTRAMUSCULAR | Status: DC | PRN
  Administered 2024-03-17: 8000 [IU] via INTRAVENOUS

## 2024-03-17 MED ORDER — LIDOCAINE HCL (PF) 1 % IJ SOLN
INTRAMUSCULAR | Status: DC | PRN
  Administered 2024-03-17: 2 mL via SUBCUTANEOUS

## 2024-03-17 MED ORDER — CLOPIDOGREL BISULFATE 75 MG PO TABS: 75.0000 mg | ORAL_TABLET | Freq: Every day | ORAL | 11 refills | Status: AC

## 2024-03-17 SURGICAL SUPPLY — 17 items
BALLOON ATHLETIS 7X40X75 (BALLOONS) IMPLANT
BALLOON MUSTANG 8X20X75 (BALLOONS) IMPLANT
BALLOON MUSTANG 8X80X75 (BALLOONS) IMPLANT
CATH SLIP KMP 65CM 5FR (CATHETERS) IMPLANT
CATH STR 7FR 55 BRITE (CATHETERS) IMPLANT
GUIDEWIRE ANGLED .035 180CM (WIRE) IMPLANT
KIT ENCORE 26 ADVANTAGE (KITS) IMPLANT
KIT ENCORE 40 (KITS) IMPLANT
KIT MICROPUNCTURE NIT STIFF (SHEATH) IMPLANT
SHEATH PINNACLE 8F 10CM (SHEATH) IMPLANT
SHEATH PINNACLE R/O II 7F 4CM (SHEATH) IMPLANT
SHEATH PROBE COVER 6X72 (BAG) IMPLANT
STENT VIABAHN 8X100X75 (Permanent Stent) IMPLANT
STOPCOCK MORSE 400PSI 3WAY (MISCELLANEOUS) IMPLANT
TRAY PV CATH (CUSTOM PROCEDURE TRAY) ×3 IMPLANT
TUBING CIL FLEX 10 FLL-RA (TUBING) IMPLANT
WIRE BENTSON .035X145CM (WIRE) IMPLANT
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
## 2024-03-17 NOTE — H&P (Signed)
 DATE OF SERVICE: 03/17/2024  PATIENT:  Louis Peters  58 y.o. male  PRE-OPERATIVE DIAGNOSIS:  ESRD, thrombosed right arm AVF  POST-OPERATIVE DIAGNOSIS:  Same  PROCEDURE:   1) ultrasound guided right arm AVF access (CPT [id number]) 2) Right arm AVF thrombectomy, angioplasty, and placement of stent (CPT [id number])  SURGEON:  Surgeons and Role:    * Carlene Che, MD - Primary  ASSISTANT: none  ANESTHESIA:   local  EBL: minimal  BLOOD ADMINISTERED:none  DRAINS: none   LOCAL MEDICATIONS USED:  LIDOCAINE    SPECIMEN:  none  COUNTS: confirmed correct.  TOURNIQUET:  none  PATIENT DISPOSITION:  PACU - hemodynamically stable.   Delay start of Pharmacological VTE agent (>24hrs) due to surgical blood loss or risk of bleeding: no  INDICATION FOR PROCEDURE: Louis Peters is a 58 y.o. male with ESRD. His right arm AVF recently was revised in Februrary 2025. His fistula thrombosed during dialysis earlier today. After careful discussion of risks, benefits, and alternatives the patient was offered thrombectomy. The patient understood and wished to proceed.  OPERATIVE FINDINGS:  Fistula patent several centimeters past the anastomosis in the antecubitum.  The fistula is occluded and thrombosed through the upper arm through this revised segment.  I was able to cross the thrombosis with a Bentson wire and KMP catheter.  Angiogram beyond the revision point showed patency.  The thrombosis was balloon macerated.  There was critical (greater than 99%) stenosis at the revision anastomosis.  Balloon angioplasty induced rupture.  This was treated with covered stenting with good effect.  Good thrill at completion.  DESCRIPTION OF PROCEDURE: After identification of the patient in the pre-operative holding area, the patient was transferred to the operating room. The patient was positioned supine on the operating room table. Anesthesia was induced. The right arm was prepped and draped in standard fashion. A  surgical pause was performed confirming correct patient, procedure, and operative location.  Using duplex ultrasound guidance, the right arm fistula was accessed with micropuncture technique.  Limited fistulogram was performed showing patency of the peripheral segment of the AV fistula and occlusion beyond this.  I was able to pass a wire through the occlusion into the native brachial and axillary vein.  Angiogram was performed in the segment showing no significant stenosis.  The thrombosed segment of fistula was then angioplastied with an 8 x 40 mm Mustang and half lettuce balloon.  Ultimately we were able to achieve balloon maceration of the thrombus.  More aggressive angioplasty of the anastomosis induced rupture.  We tamponade to this with reinflation of the balloon and prepared to stent the segment with covered stenting.  An 8 x 100 mm Viabahn stent was selected and deployed across the lesion.  Good technical result was achieved on follow-up angiogram.  Smooth thrill was noted in the fistula.  Intervention was stopped here.  On a vascular clip was removed.  A pursestring suture was placed around the exit site.  This render the access site hemostatic.  Upon completion of the case instrument and sharps counts were confirmed correct. The patient was transferred to the PACU in ood condition. I was present for all portions of the procedure.  FOLLOW UP PLAN: Follow up with me in 1 month with AVF duplex.   Louis Peters. [name], MD [facility] Phone Number: [phone] 03/17/2024 9:03 PM

## 2024-03-18 ENCOUNTER — Encounter (HOSPITAL_COMMUNITY): Payer: Self-pay | Admitting: Vascular Surgery

## 2024-03-18 NOTE — Op Note (Signed)
 DATE OF SERVICE: 03/18/2024  PATIENT:  Louis Peters  58 y.o. male  PRE-OPERATIVE DIAGNOSIS:  ESRD, thrombosed right arm AVF  POST-OPERATIVE DIAGNOSIS:  Same  PROCEDURE:   1) ultrasound guided right arm AVF access (CPT [phone]) 2) Right arm AVF thrombectomy, angioplasty, and placement of stent (CPT [phone])  SURGEON:  Surgeons and Role:    * Carlene Che, MD - Primary  ASSISTANT: none  ANESTHESIA:   local  EBL: minimal  BLOOD ADMINISTERED:none  DRAINS: none   LOCAL MEDICATIONS USED:  LIDOCAINE    SPECIMEN:  none  COUNTS: confirmed correct.  TOURNIQUET:  none  PATIENT DISPOSITION:  PACU - hemodynamically stable.   Delay start of Pharmacological VTE agent (>24hrs) due to surgical blood loss or risk of bleeding: no  INDICATION FOR PROCEDURE: Louis Peters is a 58 y.o. male with ESRD. His right arm AVF recently was revised in Februrary 2025. His fistula thrombosed during dialysis earlier today. After careful discussion of risks, benefits, and alternatives the patient was offered thrombectomy. The patient understood and wished to proceed.  OPERATIVE FINDINGS:  Fistula patent several centimeters past the anastomosis in the antecubitum.  The fistula is occluded and thrombosed through the upper arm through this revised segment.  I was able to cross the thrombosis with a Bentson wire and KMP catheter.  Angiogram beyond the revision point showed patency.  The thrombosis was balloon macerated.  There was critical (greater than 99%) stenosis at the revision anastomosis.  Balloon angioplasty induced rupture.  This was treated with covered stenting with good effect.  Good thrill at completion.  DESCRIPTION OF PROCEDURE: After identification of the patient in the pre-operative holding area, the patient was transferred to the operating room. The patient was positioned supine on the operating room table. Anesthesia was induced. The right arm was prepped and draped in standard fashion. A  surgical pause was performed confirming correct patient, procedure, and operative location.  Using duplex ultrasound guidance, the right arm fistula was accessed with micropuncture technique.  Limited fistulogram was performed showing patency of the peripheral segment of the AV fistula and occlusion beyond this.  I was able to pass a wire through the occlusion into the native brachial and axillary vein.  Angiogram was performed in the segment showing no significant stenosis.  The thrombosed segment of fistula was then angioplastied with an 8 x 40 mm Mustang and half lettuce balloon.  Ultimately we were able to achieve balloon maceration of the thrombus.  More aggressive angioplasty of the anastomosis induced rupture.  We tamponade to this with reinflation of the balloon and prepared to stent the segment with covered stenting.  An 8 x 100 mm Viabahn stent was selected and deployed across the lesion.  Good technical result was achieved on follow-up angiogram.  Smooth thrill was noted in the fistula.  Intervention was stopped here.  On a vascular clip was removed.  A pursestring suture was placed around the exit site.  This render the access site hemostatic.  Upon completion of the case instrument and sharps counts were confirmed correct. The patient was transferred to the PACU in ood condition. I was present for all portions of the procedure.  FOLLOW UP PLAN: Follow up with me in 1 month with AVF duplex.   Heber Little. [name], MD [facility] Phone Number: [phone] 03/18/2024 8:11 AM

## 2024-03-27 ENCOUNTER — Encounter (HOSPITAL_COMMUNITY): Payer: Self-pay

## 2024-03-27 ENCOUNTER — Ambulatory Visit (HOSPITAL_COMMUNITY): Admit: 2024-03-27 | Admitting: Surgery

## 2024-03-27 SURGERY — A/V SHUNT INTERVENTION
Anesthesia: LOCAL
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
## 2024-04-14 ENCOUNTER — Other Ambulatory Visit: Payer: Self-pay

## 2024-04-14 DIAGNOSIS — N186 End stage renal disease: Secondary | ICD-10-CM

## 2024-04-15 ENCOUNTER — Encounter (INDEPENDENT_AMBULATORY_CARE_PROVIDER_SITE_OTHER): Payer: Medicare Other | Admitting: Ophthalmology

## 2024-04-15 DIAGNOSIS — E113521 Type 2 diabetes mellitus with proliferative diabetic retinopathy with traction retinal detachment involving the macula, right eye: Secondary | ICD-10-CM

## 2024-04-15 DIAGNOSIS — H35033 Hypertensive retinopathy, bilateral: Secondary | ICD-10-CM

## 2024-04-15 DIAGNOSIS — Z794 Long term (current) use of insulin: Secondary | ICD-10-CM

## 2024-04-15 DIAGNOSIS — I10 Essential (primary) hypertension: Secondary | ICD-10-CM

## 2024-04-15 DIAGNOSIS — E113512 Type 2 diabetes mellitus with proliferative diabetic retinopathy with macular edema, left eye: Secondary | ICD-10-CM

## 2024-04-15 DIAGNOSIS — Z961 Presence of intraocular lens: Secondary | ICD-10-CM

## 2024-04-21 NOTE — Progress Notes (Signed)
 Established Dialysis Access   History of Present Illness   Louis Peters is a 58 y.o. (Jan 31, 1966) male who presents for follow up after AV fistulogram on 03/17/24 by Dr Magda. Angioplasty and stenting of the left AV fistula was performed.  Patient reports fistula is overall working okay. He says intermittently they have trouble with cannulating but usually are eventually able to. No issues with having to terminate his dialysis session early, no bleeding, or clots. He currently dialyzes on MWF. He is unsure of the location.  Current Outpatient Medications  Medication Sig Dispense Refill   aspirin  EC 81 MG tablet Take 1 tablet (81 mg total) by mouth daily. Swallow whole. 30 tablet 12   BAQSIMI TWO PACK 3 MG/DOSE POWD Place into both nostrils as directed.     Blood Glucose Monitoring Suppl (ONETOUCH VERIO FLEX SYSTEM) w/Device KIT by Does not apply route.     Cinacalcet HCl (SENSIPAR PO) Take by mouth.     clopidogrel  (PLAVIX ) 75 MG tablet Take 1 tablet (75 mg total) by mouth daily. 30 tablet 11   Continuous Blood Gluc Receiver (FREESTYLE LIBRE 2 READER) DEVI 1 box Diagnosis: Diabetes mellitus type 1, IDDM 1 each 3   Continuous Blood Gluc Sensor (FREESTYLE LIBRE 2 SENSOR) MISC Diagnosis: Diabetes mellitus type 1, IDDM 1 each 3   hydrALAZINE  (APRESOLINE ) 100 MG tablet Take 100 mg by mouth 2 (two) times daily.     insulin  degludec (TRESIBA ) 100 UNIT/ML FlexTouch Pen Inject 10 Units into the skin at bedtime. (Patient taking differently: Inject 7 Units into the skin at bedtime as needed (High Blood Sugar).) 10 mL 0   insulin  lispro (HUMALOG ) 100 UNIT/ML KwikPen CORRECTION FACTOR: Sliding scale CBG 70 - 120: 0 units CBG 121 - 150: 0 unit,  CBG 151 - 200: 1 units,  CBG 201 - 250: 2 units,  CBG 251 - 300: 2 units,  CBG 301 - 350: 4 units,  CBG 351 - 400: 5 units   CBG > 400: 6 units and notify your MD (Patient taking differently: Inject into the skin 3 (three) times daily. CORRECTION FACTOR: Sliding  scale CBG 70 - 120: 0 units CBG 121 - 150: 0 unit,  CBG 151 - 200: 1 units,  CBG 201 - 250: 2 units,  CBG 251 - 300: 2 units,  CBG 301 - 350: 4 units,  CBG 351 - 400: 5 units   CBG > 400: 6 units and notify your MD) 15 mL 11   Insulin  Pen Needle 32G X 4 MM MISC Use with lantus  and humalog  4 imes per day 100 each 3   ONE TOUCH LANCETS MISC Use to check blood sugar 8 time(s) daily 100 each 3   ONETOUCH VERIO test strip SMARTSIG:Via Meter 8 Times Daily 100 each 12   OVER THE COUNTER MEDICATION Take 1 tablet by mouth daily as needed (leg cramps). OTC Med for Leg Cramps     oxyCODONE -acetaminophen  (PERCOCET) 5-325 MG tablet Take 1 tablet by mouth every 6 (six) hours as needed for severe pain (pain score 7-10). 16 tablet 0   sevelamer  carbonate (RENVELA ) 800 MG tablet Take 1,600 mg by mouth 3 (three) times daily with meals.     UNABLE TO FIND Dexcom system - sensor located on L arm     Current Facility-Administered Medications  Medication Dose Route Frequency Provider Last Rate Last Admin   0.9 %  sodium chloride  infusion  250 mL Intravenous PRN Serene Gaile ORN,  MD       sodium chloride  flush (NS) 0.9 % injection 3 mL  3 mL Intravenous Q12H Serene Gaile ORN, MD        On ROS today: negative unless stated in HPI   Physical Examination   Vitals:   04/22/24 0838  BP: (!) 187/97  Pulse: 77  Resp: 20  Temp: 98 F (36.7 C)  SpO2: 98%  Weight: 173 lb 8 oz (78.7 kg)  Height: 5' 10 (1.778 m)   Body mass index is 24.89 kg/m.  General WDWN in no distress  Pulmonary Non labored  Cardiac regular  Vascular RUE AV fistula with good thrill, no pulsatility appreciated, no aneurysm   Musculo- skeletal M/S 5/5 throughout  , Extremities without ischemic changes    Neurologic A&O; CN grossly intact     Non-invasive Vascular Imaging   right Arm Access Duplex  (04/22/24):  Findings:  +--------------------+----------+-----------------+--------+  AVF                PSV (cm/s)Flow Vol  (mL/min)Comments  +--------------------+----------+-----------------+--------+  Native artery inflow   130           10                  +--------------------+----------+-----------------+--------+  AVF Anastomosis        302                               +--------------------+----------+-----------------+--------+     +------------+----------+-------------+-----------+--------+  OUTFLOW VEINPSV (cm/s)Diameter (cm)Depth (cm) Describe  +------------+----------+-------------+-----------+--------+  Prox UA        137        0.70        1.18     stent    +------------+----------+-------------+-----------+--------+  Mid UA      564 / 139  0.63 / 0.38 0.82 / 0.28          +------------+----------+-------------+-----------+--------+  Dist UA        113        1.20        0.36              +------------+----------+-------------+-----------+--------+  AC Fossa    328 / 892  0.78 / 0.49 0.26 / 0.37 plaque   +------------+----------+-------------+-----------+--------+   Stent inflow 114.2 cm/s  Stent prox 138 cm/s  Stent mid 158 cm/s  Stent distal 246 cm/s  Stent outflow 360 cm/s   Summary:  Arteriovenous fistula-Elevated velocities and a velocity ratio of greater than 3 noted.    Patent stent in the proximal upper arm with increased velocity in the outflow of  the stent with visualized plaque. Increase of velocity at area of narrowing / dilatation in the mid upper arm as well at the as distal upper arm with irregular plaque.   Medical Decision Making   [name] is a 58 y.o. male who presents for follow up after AV fistulogram on 03/17/24 by Dr Magda. Angioplasty and stenting of the left AV fistula was performed.  Patient reports fistula is overall working okay. Clinically has great thrill, no pulsatility. - Duplex today shows various areas of stenosis as well as elevated velocities. The volume flow is calculated to be very low although I do  not think that it is 10 - Despite overall the fistula reportedly working well, I have recommended repeat Fistulogram - I will arrange RUE fistulogram in the next several weeks with Dr. Magda  on a non dialysis day - He dialyzes on MWF so will try to arrange this on a Tues/ Thurs in the near future   Teretha Damme, PA-C [facility] Office: [phone]  Clinic MD: [facility]

## 2024-04-21 NOTE — H&P (View-Only) (Signed)
 Established Dialysis Access   History of Present Illness   Louis Peters is a 58 y.o. (Jan 31, 1966) male who presents for follow up after AV fistulogram on 03/17/24 by Dr Magda. Angioplasty and stenting of the left AV fistula was performed.  Patient reports fistula is overall working okay. He says intermittently they have trouble with cannulating but usually are eventually able to. No issues with having to terminate his dialysis session early, no bleeding, or clots. He currently dialyzes on MWF. He is unsure of the location.  Current Outpatient Medications  Medication Sig Dispense Refill   aspirin  EC 81 MG tablet Take 1 tablet (81 mg total) by mouth daily. Swallow whole. 30 tablet 12   BAQSIMI TWO PACK 3 MG/DOSE POWD Place into both nostrils as directed.     Blood Glucose Monitoring Suppl (ONETOUCH VERIO FLEX SYSTEM) w/Device KIT by Does not apply route.     Cinacalcet HCl (SENSIPAR PO) Take by mouth.     clopidogrel  (PLAVIX ) 75 MG tablet Take 1 tablet (75 mg total) by mouth daily. 30 tablet 11   Continuous Blood Gluc Receiver (FREESTYLE LIBRE 2 READER) DEVI 1 box Diagnosis: Diabetes mellitus type 1, IDDM 1 each 3   Continuous Blood Gluc Sensor (FREESTYLE LIBRE 2 SENSOR) MISC Diagnosis: Diabetes mellitus type 1, IDDM 1 each 3   hydrALAZINE  (APRESOLINE ) 100 MG tablet Take 100 mg by mouth 2 (two) times daily.     insulin  degludec (TRESIBA ) 100 UNIT/ML FlexTouch Pen Inject 10 Units into the skin at bedtime. (Patient taking differently: Inject 7 Units into the skin at bedtime as needed (High Blood Sugar).) 10 mL 0   insulin  lispro (HUMALOG ) 100 UNIT/ML KwikPen CORRECTION FACTOR: Sliding scale CBG 70 - 120: 0 units CBG 121 - 150: 0 unit,  CBG 151 - 200: 1 units,  CBG 201 - 250: 2 units,  CBG 251 - 300: 2 units,  CBG 301 - 350: 4 units,  CBG 351 - 400: 5 units   CBG > 400: 6 units and notify your MD (Patient taking differently: Inject into the skin 3 (three) times daily. CORRECTION FACTOR: Sliding  scale CBG 70 - 120: 0 units CBG 121 - 150: 0 unit,  CBG 151 - 200: 1 units,  CBG 201 - 250: 2 units,  CBG 251 - 300: 2 units,  CBG 301 - 350: 4 units,  CBG 351 - 400: 5 units   CBG > 400: 6 units and notify your MD) 15 mL 11   Insulin  Pen Needle 32G X 4 MM MISC Use with lantus  and humalog  4 imes per day 100 each 3   ONE TOUCH LANCETS MISC Use to check blood sugar 8 time(s) daily 100 each 3   ONETOUCH VERIO test strip SMARTSIG:Via Meter 8 Times Daily 100 each 12   OVER THE COUNTER MEDICATION Take 1 tablet by mouth daily as needed (leg cramps). OTC Med for Leg Cramps     oxyCODONE -acetaminophen  (PERCOCET) 5-325 MG tablet Take 1 tablet by mouth every 6 (six) hours as needed for severe pain (pain score 7-10). 16 tablet 0   sevelamer  carbonate (RENVELA ) 800 MG tablet Take 1,600 mg by mouth 3 (three) times daily with meals.     UNABLE TO FIND Dexcom system - sensor located on L arm     Current Facility-Administered Medications  Medication Dose Route Frequency Provider Last Rate Last Admin   0.9 %  sodium chloride  infusion  250 mL Intravenous PRN Serene Gaile ORN,  MD       sodium chloride  flush (NS) 0.9 % injection 3 mL  3 mL Intravenous Q12H Serene Gaile ORN, MD        On ROS today: negative unless stated in HPI   Physical Examination   Vitals:   04/22/24 0838  BP: (!) 187/97  Pulse: 77  Resp: 20  Temp: 98 F (36.7 C)  SpO2: 98%  Weight: 173 lb 8 oz (78.7 kg)  Height: 5' 10 (1.778 m)   Body mass index is 24.89 kg/m.  General WDWN in no distress  Pulmonary Non labored  Cardiac regular  Vascular RUE AV fistula with good thrill, no pulsatility appreciated, no aneurysm   Musculo- skeletal M/S 5/5 throughout  , Extremities without ischemic changes    Neurologic A&O; CN grossly intact     Non-invasive Vascular Imaging   right Arm Access Duplex  (04/22/24):  Findings:  +--------------------+----------+-----------------+--------+  AVF                PSV (cm/s)Flow Vol  (mL/min)Comments  +--------------------+----------+-----------------+--------+  Native artery inflow   130           10                  +--------------------+----------+-----------------+--------+  AVF Anastomosis        302                               +--------------------+----------+-----------------+--------+     +------------+----------+-------------+-----------+--------+  OUTFLOW VEINPSV (cm/s)Diameter (cm)Depth (cm) Describe  +------------+----------+-------------+-----------+--------+  Prox UA        137        0.70        1.18     stent    +------------+----------+-------------+-----------+--------+  Mid UA      564 / 139  0.63 / 0.38 0.82 / 0.28          +------------+----------+-------------+-----------+--------+  Dist UA        113        1.20        0.36              +------------+----------+-------------+-----------+--------+  AC Fossa    328 / 892  0.78 / 0.49 0.26 / 0.37 plaque   +------------+----------+-------------+-----------+--------+   Stent inflow 114.2 cm/s  Stent prox 138 cm/s  Stent mid 158 cm/s  Stent distal 246 cm/s  Stent outflow 360 cm/s   Summary:  Arteriovenous fistula-Elevated velocities and a velocity ratio of greater than 3 noted.    Patent stent in the proximal upper arm with increased velocity in the outflow of  the stent with visualized plaque. Increase of velocity at area of narrowing / dilatation in the mid upper arm as well at the as distal upper arm with irregular plaque.   Medical Decision Making   MARKEESE Peters is a 58 y.o. male who presents for follow up after AV fistulogram on 03/17/24 by Dr Magda. Angioplasty and stenting of the left AV fistula was performed.  Patient reports fistula is overall working okay. Clinically has great thrill, no pulsatility. - Duplex today shows various areas of stenosis as well as elevated velocities. The volume flow is calculated to be very low although I do  not think that it is 10 - Despite overall the fistula reportedly working well, I have recommended repeat Fistulogram - I will arrange RUE fistulogram in the next several weeks with Dr. Magda  on a non dialysis day - He dialyzes on MWF so will try to arrange this on a Tues/ Thurs in the near future   Teretha Damme, PA-C [facility] [place] Office: [phone]  Clinic MD: [facility]

## 2024-04-22 ENCOUNTER — Telehealth: Payer: Self-pay

## 2024-04-22 ENCOUNTER — Ambulatory Visit (HOSPITAL_COMMUNITY)
Admission: RE | Admit: 2024-04-22 | Discharge: 2024-04-22 | Disposition: A | Discharge status: Home / Self Care | Source: Ambulatory Visit | Attending: Vascular Surgery | Admitting: Vascular Surgery

## 2024-04-22 ENCOUNTER — Ambulatory Visit: Attending: Vascular Surgery | Admitting: Physician Assistant

## 2024-04-22 VITALS — BP 187/97 | HR 77 | Temp 98.0°F | Resp 20 | Ht 70.0 in | Wt 173.5 lb

## 2024-04-22 DIAGNOSIS — N186 End stage renal disease: Secondary | ICD-10-CM | POA: Diagnosis not present

## 2024-04-22 NOTE — Telephone Encounter (Signed)
 RUE Fistulogram w/ Dr. Magda per C. Baglia. Request sent to renal lab via fax.

## 2024-04-28 ENCOUNTER — Other Ambulatory Visit: Payer: Self-pay

## 2024-04-28 ENCOUNTER — Encounter (HOSPITAL_COMMUNITY)
Admission: RE | Disposition: A | Discharge status: Home / Self Care | Payer: Self-pay | Source: Home / Self Care | Attending: Vascular Surgery

## 2024-04-28 ENCOUNTER — Ambulatory Visit (HOSPITAL_COMMUNITY)
Admission: RE | Admit: 2024-04-28 | Discharge: 2024-04-28 | Disposition: A | Discharge status: Home / Self Care | Attending: Vascular Surgery | Admitting: Vascular Surgery

## 2024-04-28 DIAGNOSIS — Y832 Surgical operation with anastomosis, bypass or graft as the cause of abnormal reaction of the patient, or of later complication, without mention of misadventure at the time of the procedure: Secondary | ICD-10-CM | POA: Diagnosis not present

## 2024-04-28 DIAGNOSIS — T82858A Stenosis of vascular prosthetic devices, implants and grafts, initial encounter: Secondary | ICD-10-CM | POA: Insufficient documentation

## 2024-04-28 DIAGNOSIS — N186 End stage renal disease: Secondary | ICD-10-CM | POA: Insufficient documentation

## 2024-04-28 DIAGNOSIS — Z992 Dependence on renal dialysis: Secondary | ICD-10-CM | POA: Insufficient documentation

## 2024-04-28 HISTORY — PX: A/V SHUNT INTERVENTION: CATH118220

## 2024-04-28 LAB — GLUCOSE, CAPILLARY: Glucose-Capillary: 476 mg/dL — ABNORMAL HIGH (ref 70–99)

## 2024-04-28 SURGERY — A/V SHUNT INTERVENTION
Anesthesia: LOCAL | Site: Arm Lower | Laterality: Right

## 2024-04-28 MED ORDER — LIDOCAINE HCL (PF) 1 % IJ SOLN
INTRAMUSCULAR | Status: DC | PRN
  Administered 2024-04-28: 3 mL

## 2024-04-28 MED ORDER — LIDOCAINE HCL (PF) 1 % IJ SOLN
INTRAMUSCULAR | Status: AC
  Filled 2024-04-28: qty 30

## 2024-04-28 MED ORDER — HEPARIN (PORCINE) IN NACL 1000-0.9 UT/500ML-% IV SOLN
INTRAVENOUS | Status: DC | PRN
  Administered 2024-04-28: 500 mL

## 2024-04-28 MED ORDER — IODIXANOL 320 MG/ML IV SOLN
INTRAVENOUS | Status: DC | PRN
  Administered 2024-04-28: 20 mL

## 2024-04-28 SURGICAL SUPPLY — 9 items
BALLOON MUSTANG 7X60X75 (BALLOONS) IMPLANT
KIT ENCORE 26 ADVANTAGE (KITS) IMPLANT
KIT MICROPUNCTURE NIT STIFF (SHEATH) IMPLANT
SHEATH PINNACLE R/O II 6F 4CM (SHEATH) IMPLANT
SHEATH PROBE COVER 6X72 (BAG) IMPLANT
STOPCOCK MORSE 400PSI 3WAY (MISCELLANEOUS) IMPLANT
TRAY PV CATH (CUSTOM PROCEDURE TRAY) ×1 IMPLANT
TUBING CIL FLEX 10 FLL-RA (TUBING) IMPLANT
WIRE BENTSON .035X145CM (WIRE) IMPLANT

## 2024-04-28 NOTE — Op Note (Signed)
    Patient name: Louis Peters MRN: [id number] DOB: [birth year] Sex: male  04/28/2024 Pre-operative Diagnosis: ESRD on HD Post-operative diagnosis:  Same Surgeon:  Norman GORMAN Serve, MD Procedure Performed:  Ultrasound-guided access of right arm AV fistula Fistulogram and central venogram Balloon angioplasty of mid fistula stenosis, 7 mm x 60 mm Mustang  Indications: [name] is a 58 year old male with ESRD on HD who presented to the HD access center today for a fistulogram.  He reports that overall his fistula is working well depending upon who cannulate's at his dialysis center.  They report from the center mentions low flows during sessions although he had a full run today.  Recent benefits of fistulogram with intervention were reviewed and he elected to proceed.  Findings:  Widely patent central venous system.  Widely patent venous outflow stent.  There is a severe approximate 80% stenosis about 5 cm long that is just proximal to the previous placed stent.  The anastomosis is patent although mildly stenotic.   Procedure:  The patient was identified in the holding area and taken to the cath lab  The patient was then placed supine on the table and prepped and draped in the usual sterile fashion.  A time out was called.  Ultrasound was used to evaluate the left arm AV access. This was accessed under u/s guidance. An 018 wire was advanced without resistance, a micropuncture sheath was placed and fistulagram obtained which demonstrated the above findings.  This access was then upsized to a 6 F short sheath over a glidewire.  The lesion was crossed and treated with a 7 mm x 60 mm Mustang balloon.  This demonstrated an approximate 20% residual stenosis.  The wire and sheath were removed and the access was managed with a 4-0 Monocryl figure-of-eight suture for hemostasis.  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   Norman GORMAN Serve MD [facility] Office: [phone]
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
## 2024-04-28 NOTE — Interval H&P Note (Signed)
 History and Physical Interval Note:  04/28/2024 1:42 PM  Louis Peters  has presented today for surgery, with the diagnosis of N18.6.  The various methods of treatment have been discussed with the patient and family. After consideration of risks, benefits and other options for treatment, the patient has consented to  Procedure(s): A/V SHUNT INTERVENTION (Right) as a surgical intervention.  The patient's history has been reviewed, patient examined, no change in status, stable for surgery.  I have reviewed the patient's chart and labs.  Questions were answered to the patient's satisfaction.     Norman GORMAN Serve

## 2024-04-29 ENCOUNTER — Encounter (INDEPENDENT_AMBULATORY_CARE_PROVIDER_SITE_OTHER): Admitting: Ophthalmology

## 2024-04-29 ENCOUNTER — Encounter (HOSPITAL_COMMUNITY): Payer: Self-pay | Admitting: Vascular Surgery

## 2024-04-29 DIAGNOSIS — Z794 Long term (current) use of insulin: Secondary | ICD-10-CM

## 2024-04-29 DIAGNOSIS — E113521 Type 2 diabetes mellitus with proliferative diabetic retinopathy with traction retinal detachment involving the macula, right eye: Secondary | ICD-10-CM

## 2024-04-29 DIAGNOSIS — I10 Essential (primary) hypertension: Secondary | ICD-10-CM

## 2024-04-29 DIAGNOSIS — H3341 Traction detachment of retina, right eye: Secondary | ICD-10-CM

## 2024-04-29 DIAGNOSIS — Z961 Presence of intraocular lens: Secondary | ICD-10-CM

## 2024-04-29 DIAGNOSIS — H35033 Hypertensive retinopathy, bilateral: Secondary | ICD-10-CM

## 2024-04-29 DIAGNOSIS — E113512 Type 2 diabetes mellitus with proliferative diabetic retinopathy with macular edema, left eye: Secondary | ICD-10-CM

## 2024-06-10 ENCOUNTER — Ambulatory Visit (INDEPENDENT_AMBULATORY_CARE_PROVIDER_SITE_OTHER): Admitting: Podiatry

## 2024-06-10 DIAGNOSIS — Z91199 Patient's noncompliance with other medical treatment and regimen due to unspecified reason: Secondary | ICD-10-CM

## 2024-06-10 NOTE — Progress Notes (Signed)
 No show

## 2024-06-17 ENCOUNTER — Encounter: Payer: Self-pay | Admitting: Podiatry

## 2024-06-17 ENCOUNTER — Ambulatory Visit (INDEPENDENT_AMBULATORY_CARE_PROVIDER_SITE_OTHER): Admitting: Podiatry

## 2024-06-17 DIAGNOSIS — M79674 Pain in right toe(s): Secondary | ICD-10-CM

## 2024-06-17 DIAGNOSIS — M79675 Pain in left toe(s): Secondary | ICD-10-CM

## 2024-06-17 DIAGNOSIS — Z89422 Acquired absence of other left toe(s): Secondary | ICD-10-CM

## 2024-06-17 DIAGNOSIS — B351 Tinea unguium: Secondary | ICD-10-CM | POA: Diagnosis not present

## 2024-06-17 DIAGNOSIS — E1051 Type 1 diabetes mellitus with diabetic peripheral angiopathy without gangrene: Secondary | ICD-10-CM
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
## 2024-07-05 ENCOUNTER — Encounter (HOSPITAL_COMMUNITY): Payer: Self-pay

## 2024-07-05 ENCOUNTER — Other Ambulatory Visit: Payer: Self-pay

## 2024-07-05 ENCOUNTER — Emergency Department (HOSPITAL_COMMUNITY)
Admission: EM | Admit: 2024-07-05 | Discharge: 2024-07-05 | Disposition: A | Discharge status: Home / Self Care | Attending: Emergency Medicine | Admitting: Emergency Medicine

## 2024-07-05 DIAGNOSIS — Z992 Dependence on renal dialysis: Secondary | ICD-10-CM | POA: Diagnosis not present

## 2024-07-05 DIAGNOSIS — Z7902 Long term (current) use of antithrombotics/antiplatelets: Secondary | ICD-10-CM | POA: Insufficient documentation

## 2024-07-05 DIAGNOSIS — E11649 Type 2 diabetes mellitus with hypoglycemia without coma: Secondary | ICD-10-CM | POA: Insufficient documentation

## 2024-07-05 DIAGNOSIS — Z7982 Long term (current) use of aspirin: Secondary | ICD-10-CM | POA: Insufficient documentation

## 2024-07-05 DIAGNOSIS — E1122 Type 2 diabetes mellitus with diabetic chronic kidney disease: Secondary | ICD-10-CM | POA: Insufficient documentation

## 2024-07-05 DIAGNOSIS — N186 End stage renal disease: Secondary | ICD-10-CM | POA: Insufficient documentation

## 2024-07-05 DIAGNOSIS — Z794 Long term (current) use of insulin: Secondary | ICD-10-CM | POA: Insufficient documentation

## 2024-07-05 DIAGNOSIS — E162 Hypoglycemia, unspecified: Secondary | ICD-10-CM

## 2024-07-05 LAB — CBG MONITORING, ED
Glucose-Capillary: 86 mg/dL (ref 70–99)
Glucose-Capillary: 53 mg/dL — ABNORMAL LOW (ref 70–99)
Glucose-Capillary: 53 mg/dL — ABNORMAL LOW (ref 70–99)
Glucose-Capillary: 149 mg/dL — ABNORMAL HIGH (ref 70–99)
Glucose-Capillary: 197 mg/dL — ABNORMAL HIGH (ref 70–99)
Glucose-Capillary: 245 mg/dL — ABNORMAL HIGH (ref 70–99)

## 2024-07-05 LAB — CBC
HCT: 42.1 % (ref 39.0–52.0)
Hemoglobin: 13.2 g/dL (ref 13.0–17.0)
MCH: 29.8 pg (ref 26.0–34.0)
MCHC: 31.4 g/dL (ref 30.0–36.0)
MCV: 95 fL (ref 80.0–100.0)
Platelets: 228 K/uL (ref 150–400)
RBC: 4.43 MIL/uL (ref 4.22–5.81)
RDW: 14.9 % (ref 11.5–15.5)
WBC: 8.7 K/uL (ref 4.0–10.5)
nRBC: 0 % (ref 0.0–0.2)

## 2024-07-05 LAB — BASIC METABOLIC PANEL WITH GFR
Anion gap: 16 — ABNORMAL HIGH (ref 5–15)
BUN: 43 mg/dL — ABNORMAL HIGH (ref 6–20)
CO2: 28 mmol/L (ref 22–32)
Calcium: 9.7 mg/dL (ref 8.9–10.3)
Chloride: 97 mmol/L — ABNORMAL LOW (ref 98–111)
Creatinine, Ser: 7.74 mg/dL — ABNORMAL HIGH (ref 0.61–1.24)
GFR, Estimated: 7 mL/min — ABNORMAL LOW (ref >60)
Glucose, Bld: 53 mg/dL — ABNORMAL LOW (ref 70–99)
Potassium: 3.9 mmol/L (ref 3.5–5.1)
Sodium: 141 mmol/L (ref 135–145)

## 2024-07-05 MED ORDER — SODIUM CHLORIDE 0.9% FLUSH
3.0000 mL | Freq: Two times a day (BID) | INTRAVENOUS | Status: DC
Start: 2024-07-05 — End: 2024-07-05
  Administered 2024-07-05: 3 mL via INTRAVENOUS

## 2024-07-05 MED ORDER — SODIUM CHLORIDE 0.9 % IV SOLN: 250.0000 mL | INTRAVENOUS | Status: DC | PRN

## 2024-07-05 MED ORDER — SODIUM CHLORIDE 0.9% FLUSH: 3.0000 mL | INTRAVENOUS | Status: DC | PRN

## 2024-07-05 MED ORDER — DEXTROSE 50 % IV SOLN: 25.0000 g | Freq: Once | INTRAVENOUS | Status: DC

## 2024-07-05 NOTE — ED Notes (Signed)
 Pt given apple juice, meal bag, and sprite. Pt endorses feeling fine.

## 2024-07-05 NOTE — ED Notes (Signed)
 Pt sitting in the bed, eating a sandwich and drinking juice.

## 2024-07-05 NOTE — ED Notes (Signed)
 Consulted with MD face-to-face. Agrees to continue with pt eating and drinking with a CBG recheck in 30 minutes.

## 2024-07-05 NOTE — ED Notes (Signed)
 Rn at bedside. Pt smiling and eating another sandwich. Pt denies any symptoms at this time.

## 2024-07-05 NOTE — ED Triage Notes (Signed)
 Pt bib ems from home c/o hypoglycemia. CBG 37 from pt monitor. EMS administered 25 g D10. Pt family state his sugar was high yesterday but unsure how high. Pt isn't sure what happened and denies taking any insulin  last night.   Cbg 154 BP 153/90 RA 100%  18G Lft FA

## 2024-07-05 NOTE — ED Provider Notes (Signed)
 [place] EMERGENCY DEPARTMENT AT [facility] Provider Note   CSN: [id number] Arrival date & time: 07/05/24  9072     Patient presents with: hypoglycema/corrected and Hypoglycemia   Louis Peters is a 58 y.o. male with history of end-stage renal disease, on dialysis, history of diabetes on insulin , presented to ED with generalized confusion and weakness.  EMS ports patient blood sugar was 37 and route to the hospital and he was given 25 g of D10.  Patient reports he is feeling better now.  He says that he ran out of his regular insulin  and cannot pick it up until Sunday.  Therefore he was administering his long-acting overnight insulin , though he cannot tell me how many units or with what frequency.  The patient does appear to have some confusion and repeats generally the same sentence when I ask him different questions, which is I ran out of my regular insulin  until Sunday, so I am using my overnight insulin  instead.  His wife Shanda says this morning she thought his sugar was low because he was out of it. He didn't eat anything. His wife says he tends to get slow and confused more chronically, emotionally labile.   HPI     Prior to Admission medications   Medication Sig Start Date End Date Taking? Authorizing Provider  aspirin  EC 81 MG tablet Take 1 tablet (81 mg total) by mouth daily. Swallow whole. Patient not taking: Reported on 06/17/2024 03/17/24   Magda Debby SAILOR, MD  BAQSIMI TWO PACK 3 MG/DOSE POWD Place into both nostrils as directed. Patient not taking: Reported on 06/17/2024    [provider]  Blood Glucose Monitoring Suppl (ONETOUCH VERIO FLEX SYSTEM) w/Device KIT by Does not apply route. 07/31/18   [provider]  Cinacalcet HCl (SENSIPAR PO) Take by mouth. Patient not taking: Reported on 06/17/2024 10/22/23 10/20/24  [provider]  clopidogrel  (PLAVIX ) 75 MG tablet Take 1 tablet (75 mg total) by mouth daily. Patient not  taking: Reported on 06/17/2024 03/17/24 03/17/25  Magda Debby SAILOR, MD  Continuous Blood Gluc Receiver (FREESTYLE LIBRE 2 READER) DEVI 1 box Diagnosis: Diabetes mellitus type 1, IDDM 07/12/22   Rai, Nydia POUR, MD  Continuous Blood Gluc Sensor (FREESTYLE LIBRE 2 SENSOR) MISC Diagnosis: Diabetes mellitus type 1, IDDM 07/12/22   Rai, Nydia POUR, MD  hydrALAZINE  (APRESOLINE ) 100 MG tablet Take 100 mg by mouth 2 (two) times daily. Patient not taking: Reported on 06/17/2024    [provider]  insulin  degludec (TRESIBA ) 100 UNIT/ML FlexTouch Pen Inject 10 Units into the skin at bedtime. Patient taking differently: Inject 7 Units into the skin at bedtime as needed (High Blood Sugar). 08/27/22   [place], Camellia PARAS, DO  insulin  lispro (HUMALOG ) 100 UNIT/ML KwikPen CORRECTION FACTOR: Sliding scale CBG 70 - 120: 0 units CBG 121 - 150: 0 unit,  CBG 151 - 200: 1 units,  CBG 201 - 250: 2 units,  CBG 251 - 300: 2 units,  CBG 301 - 350: 4 units,  CBG 351 - 400: 5 units   CBG > 400: 6 units and notify your MD Patient taking differently: Inject into the skin 3 (three) times daily. CORRECTION FACTOR: Sliding scale CBG 70 - 120: 0 units CBG 121 - 150: 0 unit,  CBG 151 - 200: 1 units,  CBG 201 - 250: 2 units,  CBG 251 - 300: 2 units,  CBG 301 - 350: 4 units,  CBG 351 - 400: 5 units  CBG > 400: 6 units and notify your MD 07/12/22   Davia Nydia POUR, MD  Insulin  Pen Needle 32G X 4 MM MISC Use with lantus  and humalog  4 imes per day 07/12/22   Rai, Nydia POUR, MD  ONE TOUCH LANCETS MISC Use to check blood sugar 8 time(s) daily 07/12/22   Rai, Nydia POUR, MD  [facility] VERIO test strip SMARTSIG:Via Meter 8 Times Daily 07/12/22   Rai, Nydia POUR, MD  OVER THE COUNTER MEDICATION Take 1 tablet by mouth daily as needed (leg cramps). OTC Med for Leg Cramps    [provider]  oxyCODONE -acetaminophen  (PERCOCET) 5-325 MG tablet Take 1 tablet by mouth every 6 (six) hours as needed for severe pain (pain score 7-10). Patient  not taking: Reported on 06/17/2024 11/30/23 11/29/24  Baglia, Corrina, PA-C  sevelamer  carbonate (RENVELA ) 800 MG tablet Take 1,600 mg by mouth 3 (three) times daily with meals. 09/21/22 10/14/24  [provider]  UNABLE TO FIND Dexcom system - sensor located on L arm    [provider]    Allergies: Allegra [fexofenadine], Latex, and Chlorhexidine  gluconate    Review of Systems  Updated Vital Signs BP (!) 185/94   Pulse 88   Temp 97.7 F (36.5 C) (Oral)   Resp 17   Ht 5' 10 (1.778 m)   Wt 81.6 kg   SpO2 100%   BMI 25.83 kg/m   Physical Exam Constitutional:      General: He is not in acute distress. HENT:     Head: Normocephalic and atraumatic.  Eyes:     Conjunctiva/sclera: Conjunctivae normal.     Pupils: Pupils are equal, round, and reactive to light.  Cardiovascular:     Rate and Rhythm: Normal rate and regular rhythm.  Pulmonary:     Effort: Pulmonary effort is normal. No respiratory distress.  Abdominal:     General: There is no distension.     Tenderness: There is no abdominal tenderness.  Skin:    General: Skin is warm and dry.  Neurological:     General: No focal deficit present.     Mental Status: He is alert. Mental status is at baseline.     Comments: Repeating some answers     (all labs ordered are listed, but only abnormal results are displayed) Labs Reviewed  BASIC METABOLIC PANEL WITH GFR - Abnormal; Notable for the following components:      Result Value   Chloride 97 (*)    Glucose, Bld 53 (*)    BUN 43 (*)    Creatinine, Ser 7.74 (*)    GFR, Estimated 7 (*)    Anion gap 16 (*)    All other components within normal limits  CBG MONITORING, ED - Abnormal; Notable for the following components:   Glucose-Capillary 53 (*)    All other components within normal limits  CBG MONITORING, ED - Abnormal; Notable for the following components:   Glucose-Capillary 53 (*)    All other components within normal limits  CBG MONITORING, ED -  Abnormal; Notable for the following components:   Glucose-Capillary 149 (*)    All other components within normal limits  CBG MONITORING, ED - Abnormal; Notable for the following components:   Glucose-Capillary 197 (*)    All other components within normal limits  CBG MONITORING, ED - Abnormal; Notable for the following components:   Glucose-Capillary 245 (*)    All other components within normal limits  CBC  CBG MONITORING, ED  EKG: None  Radiology: No results found.   .Critical Care  Performed by: Cottie Donnice PARAS, MD Authorized by: Cottie Donnice PARAS, MD   Critical care provider statement:    Critical care time (minutes):  30   Critical care time was exclusive of:  Separately billable procedures and treating other patients   Critical care was necessary to treat or prevent imminent or life-threatening deterioration of the following conditions:  Metabolic crisis   Critical care was time spent personally by me on the following activities:  Ordering and performing treatments and interventions, ordering and review of laboratory studies, ordering and review of radiographic studies, pulse oximetry, review of old charts, examination of patient and evaluation of patient's response to treatment    Medications Ordered in the ED  sodium chloride  flush (NS) 0.9 % injection 3 mL (3 mLs Intravenous Given 07/05/24 1040)  sodium chloride  flush (NS) 0.9 % injection 3 mL (has no administration in time range)  0.9 %  sodium chloride  infusion (has no administration in time range)    Clinical Course as of 07/05/24 1436  Sat Jul 05, 2024  1106 Pt is eating now, I advised RN to recheck BS shortly after he finishes his meal [MT]  1158 Glucose-Capillary(!): 149 [MT]    Clinical Course User Index [MT] Kilo Eshelman, Donnice PARAS, MD                                 Medical Decision Making Amount and/or Complexity of Data Reviewed Labs: ordered. Decision-making details documented in ED  Course.  Risk Prescription drug management.   This patient presents to the ED with concern for hypoglycemia, confusion. This involves an extensive number of treatment options, and is a complaint that carries with it a high risk of complications and morbidity.  The differential diagnosis includes iatrogenic hypoglycemia related to missed use of long-acting insulin  most likely, versus infection versus anemia versus other metabolic derangement  Co-morbidities that complicate the patient evaluation: History of end-stage renal disease on dialysis at risk metabolic derangement  Additional history obtained from EMS  External records from outside source obtained and reviewed including discharge summary records from most recent hospitalization in November 2023, noting at that time the patient was on a sliding scale of Humalog  with meals, and was taking 10 units of Tresiba  at bedtime.  I ordered and personally interpreted labs.  The pertinent results include: Initial hypoglycemia corrected after eating.  Patient has known chronic kidney disease with dialysis, creatinine 7.7.  No indication for emergent dialysis at this time.   The patient was maintained on a cardiac monitor.  I personally viewed and interpreted the cardiac monitored which showed an underlying rhythm of: Sinus rhythm   I have reviewed the patients home medicines and have made adjustments as needed  Test Considered: No indication for neuroimaging at this time.  Doubt stroke or CVA  After the interventions noted above, I reevaluated the patient and found that they have: improved  Patient is wanting to go home.  His wife is present at the bedside.  I again discussed and stressed importance of adhering to his insulin  regimen.  He will take his long-acting insulin  which he still has at home at bedtime.  He does not need to take this more often in the daytime.  But he will pick up his regular short acting insulin  which is ready at the  pharmacy tomorrow.  I advised to  call the PCP to schedule follow-up appointment and attend his next dialysis session.  They are comfortable with this plan   Disposition:  After consideration of the diagnostic results and the patients response to treatment, I feel that the patent would benefit from close outpatient follow-up      Final diagnoses:  Hypoglycemia    ED Discharge Orders     None          Randol Zumstein, Donnice PARAS, MD 07/05/24 1436

## 2024-07-05 NOTE — Discharge Instructions (Addendum)
 Please remember to take your normal insulin  tonight Tresiba  10 units at bedtime.  It is very important to pick up your normal insulin  tomorrow and begin taking it regularly with meals.

## 2024-07-22 NOTE — Progress Notes (Signed)
 Attempted to contact the patient to discuss upcoming appointments and recommended health maintenance screenings for the year.  A [facility] Specialist has reviewed the patient's chart to identify any gaps in care and outstanding preventive health needs. However, we were unable to reach the patient to review or schedule the necessary follow-up appointments and screenings.  NA  Left message: no   MyChart message sent: yes   No dial  tone   Comments:

## 2024-08-31 ENCOUNTER — Encounter (HOSPITAL_COMMUNITY): Payer: Self-pay

## 2024-08-31 ENCOUNTER — Emergency Department (HOSPITAL_COMMUNITY)
Admission: EM | Admit: 2024-08-31 | Discharge: 2024-08-31 | Disposition: A | Discharge status: Home / Self Care | Attending: Emergency Medicine | Admitting: Emergency Medicine

## 2024-08-31 ENCOUNTER — Other Ambulatory Visit: Payer: Self-pay

## 2024-08-31 ENCOUNTER — Emergency Department (HOSPITAL_COMMUNITY)

## 2024-08-31 DIAGNOSIS — M25561 Pain in right knee: Secondary | ICD-10-CM | POA: Diagnosis present

## 2024-08-31 DIAGNOSIS — I10 Essential (primary) hypertension: Secondary | ICD-10-CM | POA: Diagnosis not present

## 2024-08-31 DIAGNOSIS — M25461 Effusion, right knee: Secondary | ICD-10-CM | POA: Diagnosis not present

## 2024-08-31 DIAGNOSIS — Z794 Long term (current) use of insulin: Secondary | ICD-10-CM | POA: Insufficient documentation

## 2024-08-31 DIAGNOSIS — W1830XA Fall on same level, unspecified, initial encounter: Secondary | ICD-10-CM | POA: Insufficient documentation

## 2024-08-31 DIAGNOSIS — Z7902 Long term (current) use of antithrombotics/antiplatelets: Secondary | ICD-10-CM | POA: Insufficient documentation

## 2024-08-31 DIAGNOSIS — Z7982 Long term (current) use of aspirin: Secondary | ICD-10-CM | POA: Diagnosis not present

## 2024-08-31 DIAGNOSIS — Z91040 Latex allergy status: Secondary | ICD-10-CM | POA: Insufficient documentation

## 2024-08-31 MED ORDER — IBUPROFEN 800 MG PO TABS
800.0000 mg | ORAL_TABLET | Freq: Once | ORAL | Status: AC
  Administered 2024-08-31: 800 mg via ORAL
  Filled 2024-08-31: qty 1

## 2024-08-31 MED ORDER — ACETAMINOPHEN 325 MG PO TABS
650.0000 mg | ORAL_TABLET | Freq: Once | ORAL | Status: AC
  Administered 2024-08-31: 650 mg via ORAL
  Filled 2024-08-31: qty 2

## 2024-08-31 NOTE — ED Triage Notes (Signed)
 Patient slipped on leaves while leaf blowing and his knee gave out, patient reports pain when he puts weight on it and swelling.  196/98 CBG 106 HR 86 RR 18 99% RA

## 2024-08-31 NOTE — Discharge Instructions (Addendum)
 If you are still unable to bear weight in 1 week, schedule a follow-up appointment with the orthopedic clinic listed above.  You can wear the knee immobilizer when walking for extra support.  You can take it off when showering or resting or in bed.  You can use over-the-counter medications like ibuprofen and Tylenol  as needed for pain and swelling for the next 5-7 days.  You can also apply ice packs for 10 minutes at a time to your right knee to help with swelling.  Try to keep weight off of your leg is much as possible for the next 2 or 3 days, to help with the swelling.  Please be aware that your blood pressure was high today.  This may be a response to the pain in the urine.  However it is importantly continue monitoring her blood pressure at home.  You can follow-up with your primary care clinic for this issue.

## 2024-08-31 NOTE — ED Provider Notes (Signed)
 [place] EMERGENCY DEPARTMENT AT [facility] Provider Note   CSN: [id number] Arrival date & time: 08/31/24  1404     Patient presents with: Knee Pain   Louis Peters is a 58 y.o. male presented ED complaining of right knee pain.  Patient reports that he was out using a leaf blower and lost his balance and fell, and is having pain in his right knee and difficulty bearing weight ever since then.   HPI     Prior to Admission medications   Medication Sig Start Date End Date Taking? Authorizing Provider  aspirin  EC 81 MG tablet Take 1 tablet (81 mg total) by mouth daily. Swallow whole. Patient not taking: Reported on 06/17/2024 03/17/24   Magda Debby SAILOR, MD  BAQSIMI TWO PACK 3 MG/DOSE POWD Place into both nostrils as directed. Patient not taking: Reported on 06/17/2024    [provider]  Blood Glucose Monitoring Suppl (ONETOUCH VERIO FLEX SYSTEM) w/Device KIT by Does not apply route. 07/31/18   [provider]  Cinacalcet HCl (SENSIPAR PO) Take by mouth. Patient not taking: Reported on 06/17/2024 10/22/23 10/20/24  [provider]  clopidogrel  (PLAVIX ) 75 MG tablet Take 1 tablet (75 mg total) by mouth daily. Patient not taking: Reported on 06/17/2024 03/17/24 03/17/25  Magda Debby SAILOR, MD  Continuous Blood Gluc Receiver (FREESTYLE LIBRE 2 READER) DEVI 1 box Diagnosis: Diabetes mellitus type 1, IDDM 07/12/22   Rai, Nydia POUR, MD  Continuous Blood Gluc Sensor (FREESTYLE LIBRE 2 SENSOR) MISC Diagnosis: Diabetes mellitus type 1, IDDM 07/12/22   Rai, Nydia POUR, MD  hydrALAZINE  (APRESOLINE ) 100 MG tablet Take 100 mg by mouth 2 (two) times daily. Patient not taking: Reported on 06/17/2024    [provider]  insulin  degludec (TRESIBA ) 100 UNIT/ML FlexTouch Pen Inject 10 Units into the skin at bedtime. Patient taking differently: Inject 7 Units into the skin at bedtime as needed (High Blood Sugar). 08/27/22   [place], Camellia PARAS, DO  insulin  lispro  (HUMALOG ) 100 UNIT/ML KwikPen CORRECTION FACTOR: Sliding scale CBG 70 - 120: 0 units CBG 121 - 150: 0 unit,  CBG 151 - 200: 1 units,  CBG 201 - 250: 2 units,  CBG 251 - 300: 2 units,  CBG 301 - 350: 4 units,  CBG 351 - 400: 5 units   CBG > 400: 6 units and notify your MD Patient taking differently: Inject into the skin 3 (three) times daily. CORRECTION FACTOR: Sliding scale CBG 70 - 120: 0 units CBG 121 - 150: 0 unit,  CBG 151 - 200: 1 units,  CBG 201 - 250: 2 units,  CBG 251 - 300: 2 units,  CBG 301 - 350: 4 units,  CBG 351 - 400: 5 units   CBG > 400: 6 units and notify your MD 07/12/22   Davia Nydia POUR, MD  Insulin  Pen Needle 32G X 4 MM MISC Use with lantus  and humalog  4 imes per day 07/12/22   Rai, Nydia POUR, MD  ONE TOUCH LANCETS MISC Use to check blood sugar 8 time(s) daily 07/12/22   Rai, Nydia POUR, MD  [facility] VERIO test strip SMARTSIG:Via Meter 8 Times Daily 07/12/22   Rai, Nydia POUR, MD  OVER THE COUNTER MEDICATION Take 1 tablet by mouth daily as needed (leg cramps). OTC Med for Leg Cramps    [provider]  oxyCODONE -acetaminophen  (PERCOCET) 5-325 MG tablet Take 1 tablet by mouth every 6 (six) hours as needed for severe pain (pain score  7-10). Patient not taking: Reported on 06/17/2024 11/30/23 11/29/24  Baglia, Corrina, PA-C  sevelamer  carbonate (RENVELA ) 800 MG tablet Take 1,600 mg by mouth 3 (three) times daily with meals. 09/21/22 10/14/24  [provider]  UNABLE TO FIND Dexcom system - sensor located on L arm    [provider]    Allergies: Allegra [fexofenadine], Latex, and Chlorhexidine  gluconate    Review of Systems  Updated Vital Signs BP (!) 187/88 (BP Location: Left Arm)   Pulse 89   Temp 98.5 F (36.9 C) (Oral)   Resp 18   Ht 5' 10 (1.778 m)   Wt 81.6 kg   SpO2 96%   BMI 25.81 kg/m   Physical Exam Constitutional:      General: He is not in acute distress. HENT:     Head: Normocephalic and atraumatic.  Eyes:      Conjunctiva/sclera: Conjunctivae normal.     Pupils: Pupils are equal, round, and reactive to light.  Cardiovascular:     Rate and Rhythm: Normal rate and regular rhythm.  Pulmonary:     Effort: Pulmonary effort is normal. No respiratory distress.  Musculoskeletal:     Comments: Patient is able to fully flex and extend lower extremity at the right knee.  He has some mild effusion peripatellar palpable on exam.  No significant isolated tenderness of the patella head or tibial plateau.  No tenderness of the tibia or fibula of the lower extremity.  Skin:    General: Skin is warm and dry.  Neurological:     General: No focal deficit present.     Mental Status: He is alert. Mental status is at baseline.  Psychiatric:        Mood and Affect: Mood normal.        Behavior: Behavior normal.     (all labs ordered are listed, but only abnormal results are displayed) Labs Reviewed - No data to display  EKG: None  Radiology: DG Knee Complete 4 Views Right Result Date: 08/31/2024 CLINICAL DATA:  Right knee pain and swelling. EXAM: RIGHT KNEE - COMPLETE 4+ VIEW COMPARISON:  None Available. FINDINGS: No fracture or bone lesion. Knee joint is normally spaced and aligned. No significant degenerative/arthropathic changes. No convincing joint effusion. Skeletal structures are demineralized. Posterior arterial vascular calcifications. IMPRESSION: 1. No fracture.  No significant joint abnormality Electronically Signed   By: Alm Parkins M.D.   On: 08/31/2024 16:02     Procedures   Medications Ordered in the ED  ibuprofen  (ADVIL ) tablet 800 mg (800 mg Oral Given 08/31/24 1546)  acetaminophen  (TYLENOL ) tablet 650 mg (650 mg Oral Given 08/31/24 1546)                                    Medical Decision Making Amount and/or Complexity of Data Reviewed Radiology: ordered.  Risk OTC drugs. Prescription drug management.   Patient is here with mechanical fall and injury of the right knee.  He is  neurovascularly intact.  He does have some swelling of the knee.  This may be a ligament injury or strain.  X-rays were personally reviewed, notable for no emergent findings  Oral ibuprofen  and Tylenol  was given for pain  Incidental hypertension noted.  Patient to follow-up as an outpatient for this issue.  I did offer a knee immobilizer for extra support so his knee does not give out on him when walking.  He can bear weight as tolerated.  I doubt that this is an ACL or PCL tear.  Recommended orthopedic follow-up in 1 week if he has no improvement in his symptoms.     Final diagnoses:  Acute pain of right knee  Hypertension, unspecified type    ED Discharge Orders     None          Cottie Donnice PARAS, MD 08/31/24 [phone]

## 2024-08-31 NOTE — Progress Notes (Signed)
 Orthopedic Tech Progress Note Patient Details:  Louis Peters 02/20/1966 [id number] Applied knee immobilizer to patients RLE, well tolerated.  Ortho Devices Type of Ortho Device: Knee Immobilizer Ortho Device/Splint Location: RLE Ortho Device/Splint Interventions: Ordered, Application, Adjustment   Post Interventions Patient Tolerated: Well Instructions Provided: Adjustment of device, Care of device  Louis Peters December 08/31/2024, 4:25 PM

## 2024-09-16 ENCOUNTER — Ambulatory Visit: Admitting: Podiatry

## 2024-09-18 ENCOUNTER — Encounter: Payer: Self-pay | Admitting: Podiatry

## 2024-09-18 ENCOUNTER — Ambulatory Visit: Admitting: Podiatry

## 2024-09-18 VITALS — Ht 70.0 in | Wt 179.9 lb

## 2024-09-18 DIAGNOSIS — M79674 Pain in right toe(s): Secondary | ICD-10-CM

## 2024-09-18 DIAGNOSIS — M79675 Pain in left toe(s): Secondary | ICD-10-CM

## 2024-09-18 DIAGNOSIS — Z89422 Acquired absence of other left toe(s): Secondary | ICD-10-CM | POA: Diagnosis not present

## 2024-09-18 DIAGNOSIS — N186 End stage renal disease: Secondary | ICD-10-CM | POA: Diagnosis not present

## 2024-09-18 DIAGNOSIS — B351 Tinea unguium: Secondary | ICD-10-CM

## 2024-09-18 DIAGNOSIS — E1051 Type 1 diabetes mellitus with diabetic peripheral angiopathy without gangrene: Secondary | ICD-10-CM | POA: Diagnosis not present

## 2024-09-18 NOTE — Progress Notes (Signed)
 This patient returns to my office for at risk foot care.  This patient requires this care by a professional since this patient will be at risk due to having diabetes, and ESRD. This patient is unable to cut nails himself since the patient cannot reach his nails.These nails are painful walking and wearing shoes.  This patient presents for at risk foot care today.  General Appearance  Alert, conversant and in no acute stress.  Vascular  Dorsalis pedis and posterior tibial  pulses are palpable  bilaterally.  Capillary return is within normal limits  bilaterally. Temperature is within normal limits  bilaterally.  Neurologic  Senn-Weinstein monofilament wire test within normal limits  bilaterally. Muscle power within normal limits bilaterally.  Nails Thick disfigured discolored nails with subungual debris  from hallux to fifth toes bilaterally. No evidence of bacterial infection or drainage bilaterally.  Orthopedic  No limitations of motion  feet .  No crepitus or effusions noted.  No bony pathology or digital deformities noted. 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  Skin  normotropic skin with no porokeratosis noted bilaterally.  No signs of infections or ulcers noted.     Onychomycosis  Pain in right toes  Pain in left toes  Consent was obtained for treatment procedures.   Mechanical debridement of nails 1-5  bilaterally performed with a nail nipper.  Filed with dremel without incident.    Return office visit    3 months                  Told patient to return for periodic foot care and evaluation due to potential at risk complications.   Cordella Bold DPM
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
## 2024-10-28 ENCOUNTER — Encounter (INDEPENDENT_AMBULATORY_CARE_PROVIDER_SITE_OTHER): Admitting: Ophthalmology

## 2024-10-28 DIAGNOSIS — E113521 Type 2 diabetes mellitus with proliferative diabetic retinopathy with traction retinal detachment involving the macula, right eye: Secondary | ICD-10-CM

## 2024-10-28 DIAGNOSIS — Z794 Long term (current) use of insulin: Secondary | ICD-10-CM

## 2024-10-28 DIAGNOSIS — I10 Essential (primary) hypertension: Secondary | ICD-10-CM

## 2024-10-28 DIAGNOSIS — H35033 Hypertensive retinopathy, bilateral: Secondary | ICD-10-CM

## 2024-10-28 DIAGNOSIS — E113512 Type 2 diabetes mellitus with proliferative diabetic retinopathy with macular edema, left eye: Secondary | ICD-10-CM

## 2024-10-28 DIAGNOSIS — Z961 Presence of intraocular lens: Secondary | ICD-10-CM
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
## 2024-11-04 ENCOUNTER — Encounter (INDEPENDENT_AMBULATORY_CARE_PROVIDER_SITE_OTHER): Admitting: Ophthalmology

## 2024-11-04 DIAGNOSIS — H35033 Hypertensive retinopathy, bilateral: Secondary | ICD-10-CM

## 2024-11-04 DIAGNOSIS — Z794 Long term (current) use of insulin: Secondary | ICD-10-CM

## 2024-11-04 DIAGNOSIS — Z961 Presence of intraocular lens: Secondary | ICD-10-CM

## 2024-11-04 DIAGNOSIS — E113512 Type 2 diabetes mellitus with proliferative diabetic retinopathy with macular edema, left eye: Secondary | ICD-10-CM

## 2024-11-04 DIAGNOSIS — E113521 Type 2 diabetes mellitus with proliferative diabetic retinopathy with traction retinal detachment involving the macula, right eye: Secondary | ICD-10-CM

## 2024-11-04 DIAGNOSIS — I10 Essential (primary) hypertension: Secondary | ICD-10-CM

## 2024-12-17 ENCOUNTER — Ambulatory Visit: Admitting: Podiatry
